# Patient Record
Sex: Male | Born: 1955
Health system: Southern US, Community
[De-identification: ages and names within clinical notes are randomized; demographics above are authoritative.]

## PROBLEM LIST (undated history)

## (undated) DIAGNOSIS — I739 Peripheral vascular disease, unspecified: Secondary | ICD-10-CM

## (undated) DIAGNOSIS — E785 Hyperlipidemia, unspecified: Secondary | ICD-10-CM

## (undated) DIAGNOSIS — R748 Abnormal levels of other serum enzymes: Secondary | ICD-10-CM

## (undated) DIAGNOSIS — I219 Acute myocardial infarction, unspecified: Secondary | ICD-10-CM

## (undated) DIAGNOSIS — I2510 Atherosclerotic heart disease of native coronary artery without angina pectoris: Secondary | ICD-10-CM

## (undated) DIAGNOSIS — E119 Type 2 diabetes mellitus without complications: Secondary | ICD-10-CM

## (undated) DIAGNOSIS — Z87442 Personal history of urinary calculi: Secondary | ICD-10-CM

## (undated) HISTORY — PX: CORONARY STENT PLACEMENT: SHX1402

## (undated) HISTORY — DX: Hyperlipidemia, unspecified: E78.5

## (undated) HISTORY — PX: EYE SURGERY: SHX253

## (undated) HISTORY — DX: Acute myocardial infarction, unspecified: I21.9

## (undated) HISTORY — DX: Atherosclerotic heart disease of native coronary artery without angina pectoris: I25.10

## (undated) HISTORY — DX: Peripheral vascular disease, unspecified: I73.9

## (undated) HISTORY — DX: Abnormal levels of other serum enzymes: R74.8

## (undated) HISTORY — DX: Type 2 diabetes mellitus without complications: E11.9

## (undated) HISTORY — DX: Personal history of urinary calculi: Z87.442

---

## 2004-01-21 ENCOUNTER — Other Ambulatory Visit: Payer: Self-pay

## 2004-01-23 ENCOUNTER — Other Ambulatory Visit: Payer: Self-pay

## 2005-09-06 ENCOUNTER — Ambulatory Visit: Payer: Self-pay | Admitting: Urology

## 2005-09-06 IMAGING — CT CT ABD-PELV W/O CM
1 of 2 series · 15 of 32 positions shown, 19 images · non-contrast
Comparison: none

REASON FOR EXAM: neophrolithiasis  renal colic  hematuria
COMMENTS:

[Series 2: stone · axial · 0.72mm/px · z∈[-54,+356]mm · 15 of 155 slices shown, 19 images]
[im 12/155  soft-tissue]
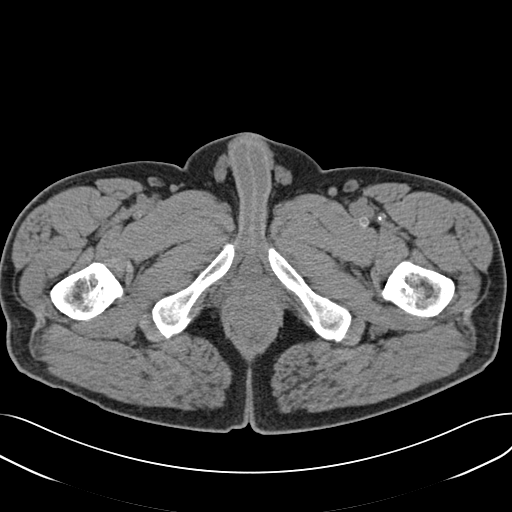
[im 12/155  bone]
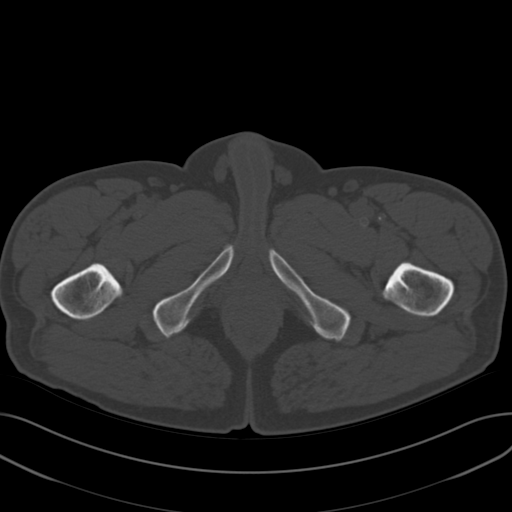
[im 23/155  soft-tissue]
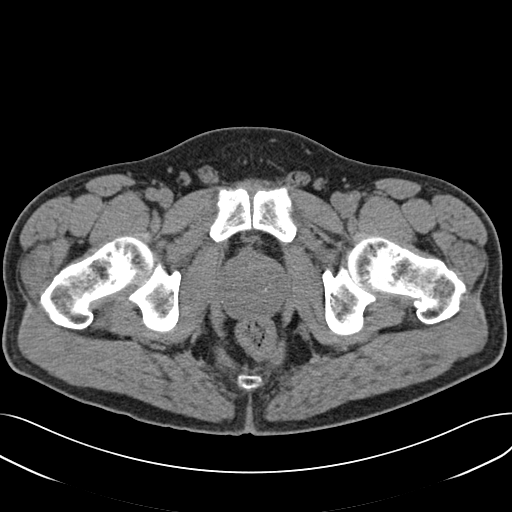
[im 34/155  soft-tissue]
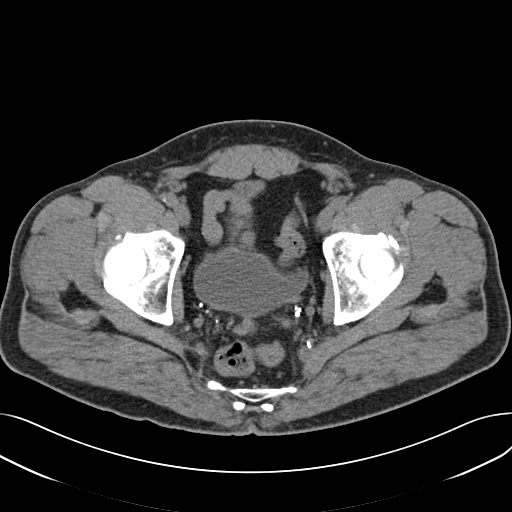
[im 45/155  soft-tissue]
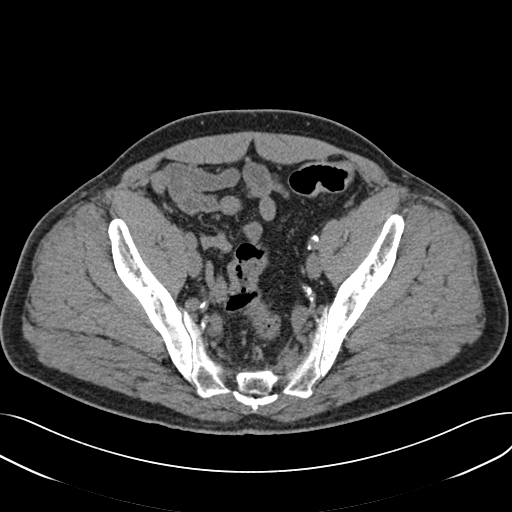
[im 56/155  soft-tissue]
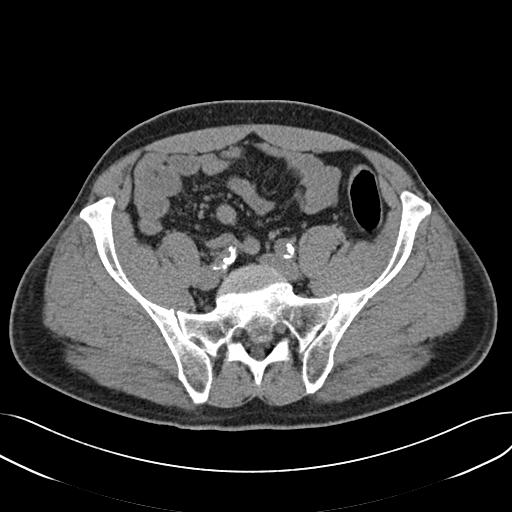
[im 67/155  soft-tissue]
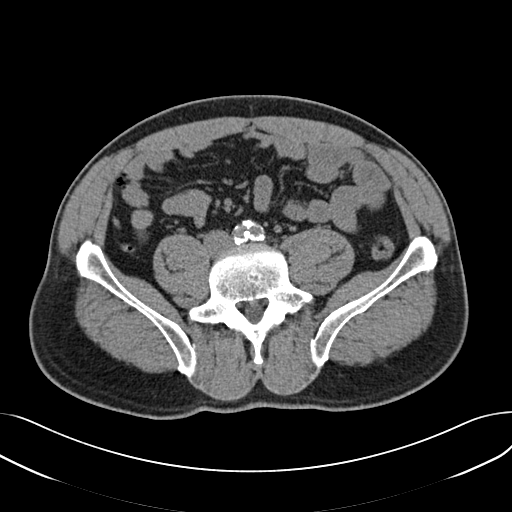
[im 78/155  soft-tissue]
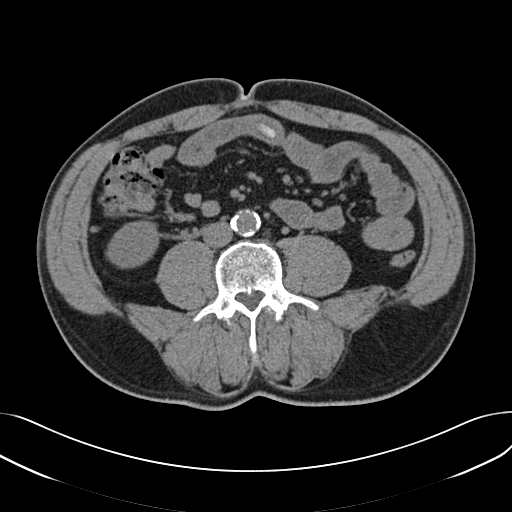
[im 89/155  soft-tissue]
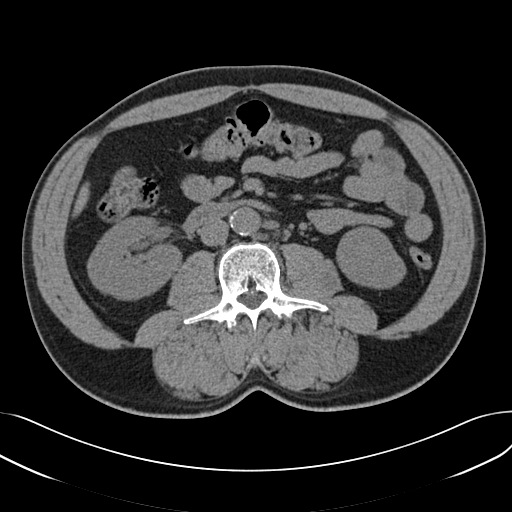
[im 100/155  soft-tissue]
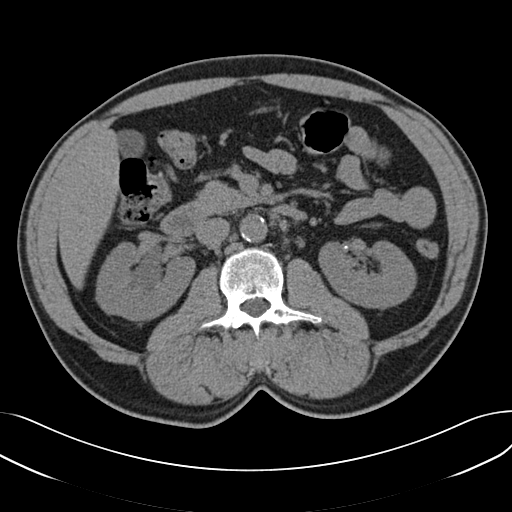
[im 100/155  bone]
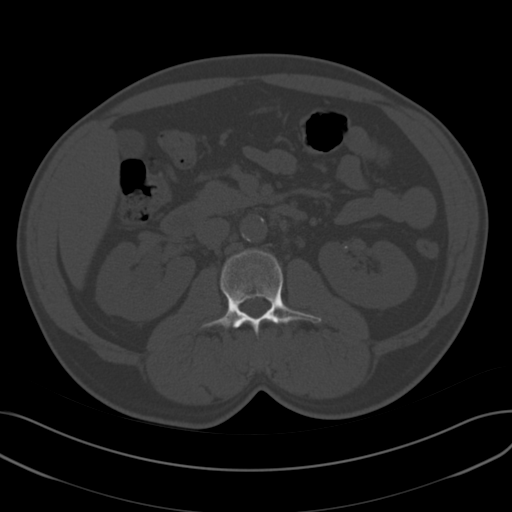
[im 111/155  soft-tissue]
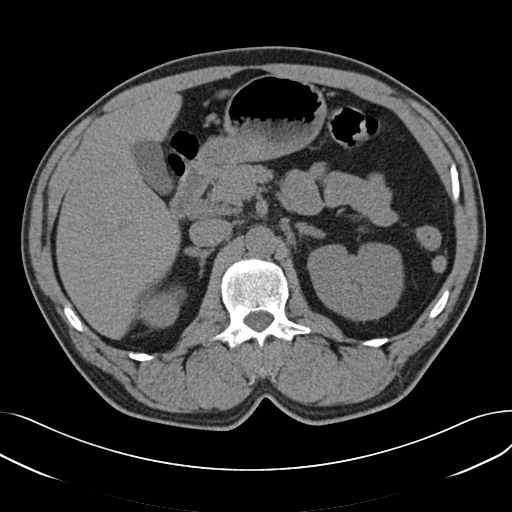
[im 122/155  soft-tissue]
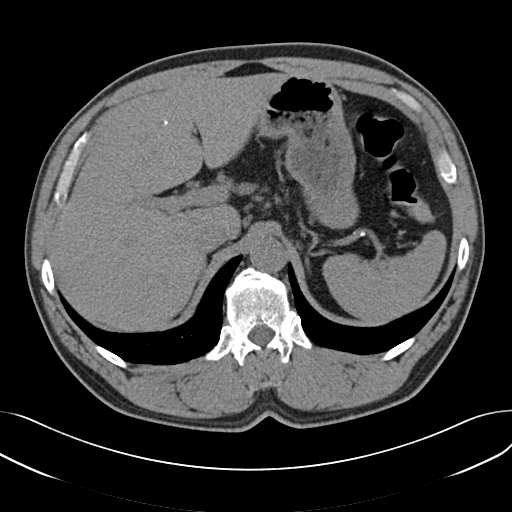
[im 133/155  soft-tissue]
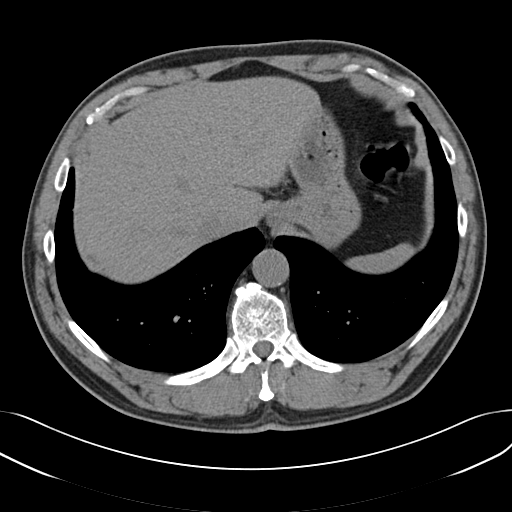
[im 133/155  lung]
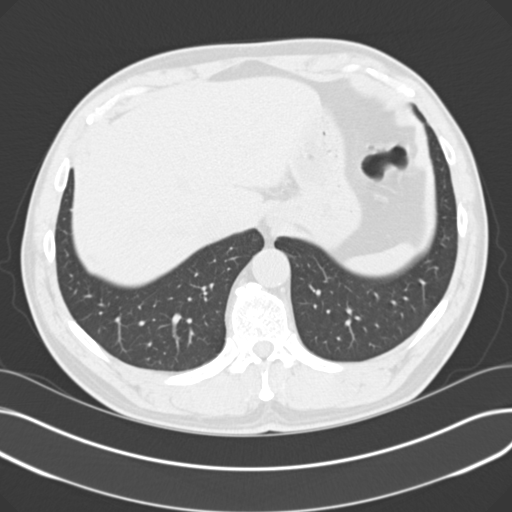
[im 138/155  lung]
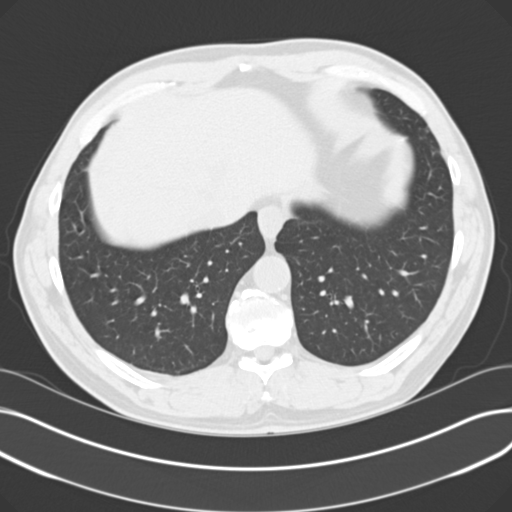
[im 144/155  soft-tissue]
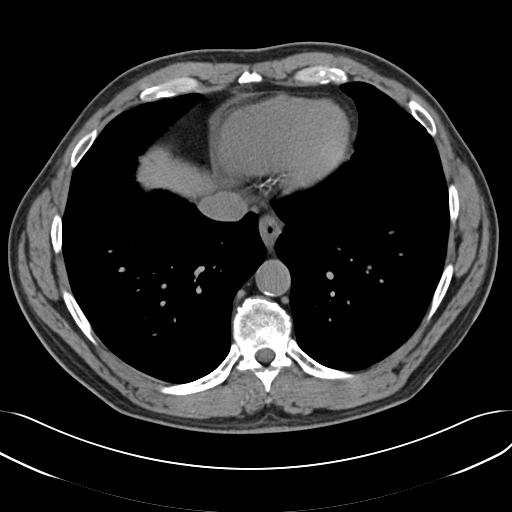
[im 144/155  lung]
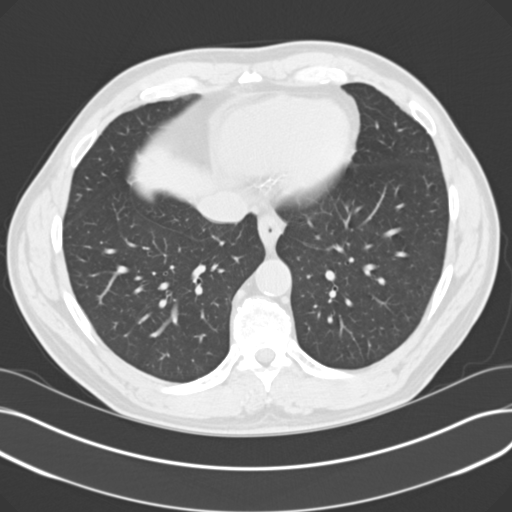
[im 149/155  lung]
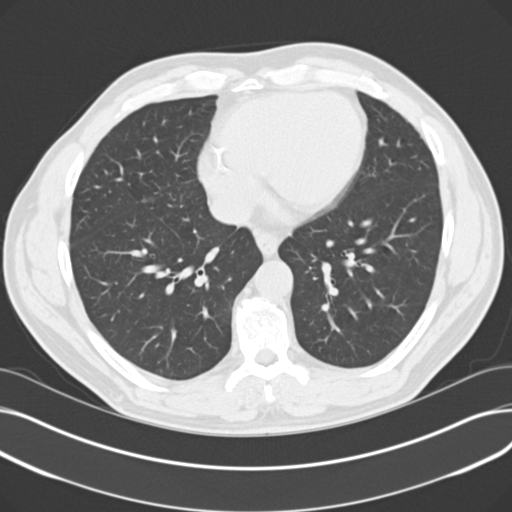

[15 of 32 positions shown; findings below may reference images not displayed]

PROCEDURE:     CT  - CT ABDOMEN AND PELVIS W[DATE]  [DATE]

RESULT:     Spiral non-contrast 3 mm sections were obtained from the lung
bases through the pubic symphysis.

Evaluation of the lung bases demonstrates no gross abnormalities.

The RIGHT kidney demonstrates a large calculus within the renal pelvis
measuring approximately 1.25 cm in widest diameter.  There is moderate
pelvocaliectasis.  Small, non-obstructing calculi are demonstrated within
the caliceal region.  These appear to measure 1 to 3 mm in diameter.  No
evidence of RIGHT renal masses are identified.  There is no evidence of
hydroureter.  Multiple phleboliths are demonstrated within the pelvis. A 3
mm calcified density projects in the expected course of the RIGHT ureter
possibly representing a non-obstructing ureteral calculus within the distal
ureter just proximal to the ureterovesical junction.

Evaluation of the LEFT kidney demonstrates no evidence of hydronephrosis nor
masses. There appears to be vascular calcifications involving the renal
arteries. There is no evidence of hydroureter nor ureterolithiasis.
Non-contrast evaluation of the liver demonstrates a calcified density within
the LEFT lobe of the liver. The spleen, adrenals and pancreas are
unremarkable.  There is no evidence of free fluid, drainable loculated fluid
collections nor masses. Small, sub cm lymph nodes are demonstrated within
the mesentery and retroperitoneal regions.  These may represent reactive
lymph nodes.
IMPRESSION: 1)Large calculus within the RIGHT renal pelvis with associated moderate
pelvocaliectasis.

2)Findings suspicious for a possible non-obstructing distal RIGHT ureteral
calculus.  This is difficult to ascertain secondary to the multiplicity of
the phleboliths projecting in the expected course of the distal RIGHT
ureter.

3)Coarse vascular calcifications.

## 2005-09-08 ENCOUNTER — Other Ambulatory Visit: Payer: Self-pay

## 2005-09-15 ENCOUNTER — Inpatient Hospital Stay: Payer: Self-pay | Admitting: Urology

## 2005-09-15 IMAGING — CR DG ABDOMEN 1V
1 series · 1 of 1 positions shown · non-contrast
Comparison: none

REASON FOR EXAM: Renal calculi-lithotripsy
COMMENTS:

[view not recorded]
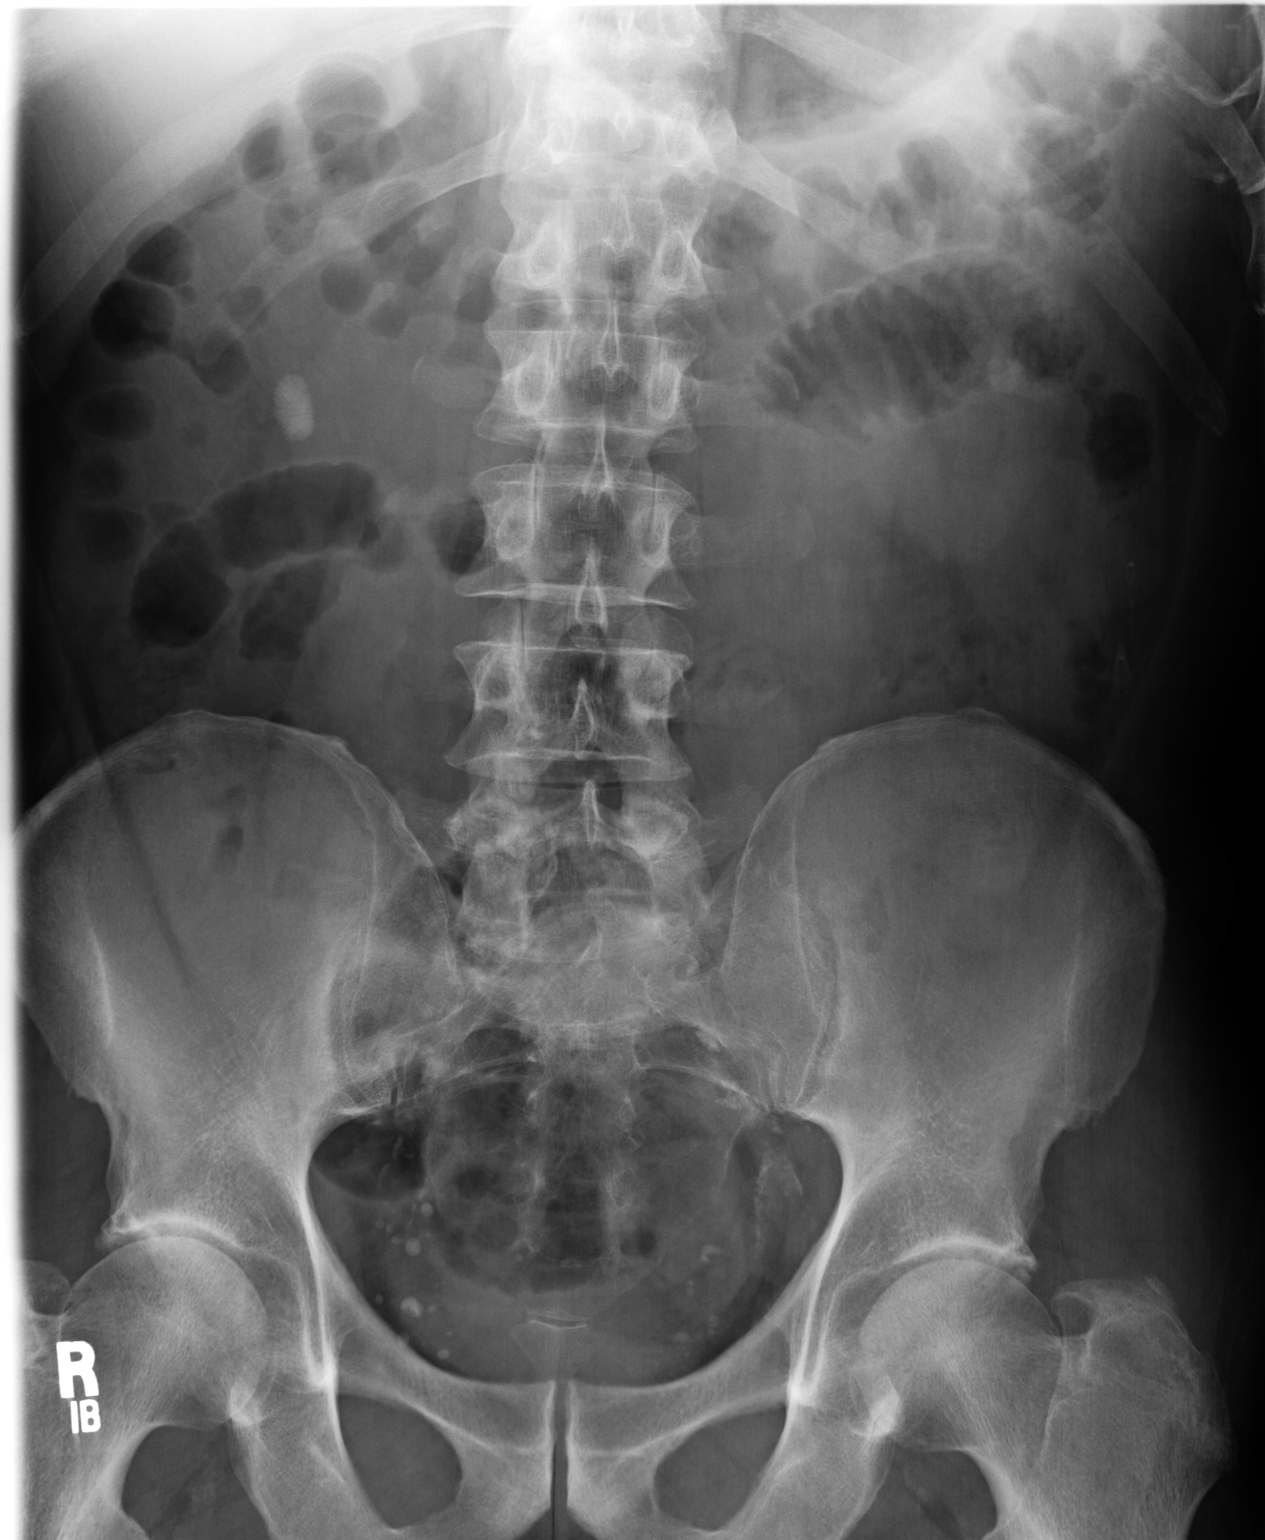

[1 of 1 positions shown; findings below may reference images not displayed]

PROCEDURE:     DXR - DXR KIDNEY URETER BLADDER  - [DATE]  [DATE]

RESULT:        There is a 9 mm x 17 mm calcific density projected at the
midpole region of the RIGHT kidney and which would appear to correspond to
the renal calculus observed at prior CT. Additional smaller calculi were
obtained at CT but these are noted on plain film examination and apparently
are faint calcifications or are obscured by overlying bowel and bowel
content.   No renal calcifications are seen on the LEFT.  There are multiple
calcifications in the pelvis compatible with phleboliths.    The bowel gas
pattern is normal.
IMPRESSION: Please see above.

## 2005-09-16 IMAGING — CR DG ABDOMEN 1V
1 series · 2 of 2 positions shown · non-contrast
Comparison: none

REASON FOR EXAM: Nephrolithiasis
COMMENTS:

[Series 1: view not recorded · 0.17mm/px · 2 of 2 slices shown]
[im 1/2]
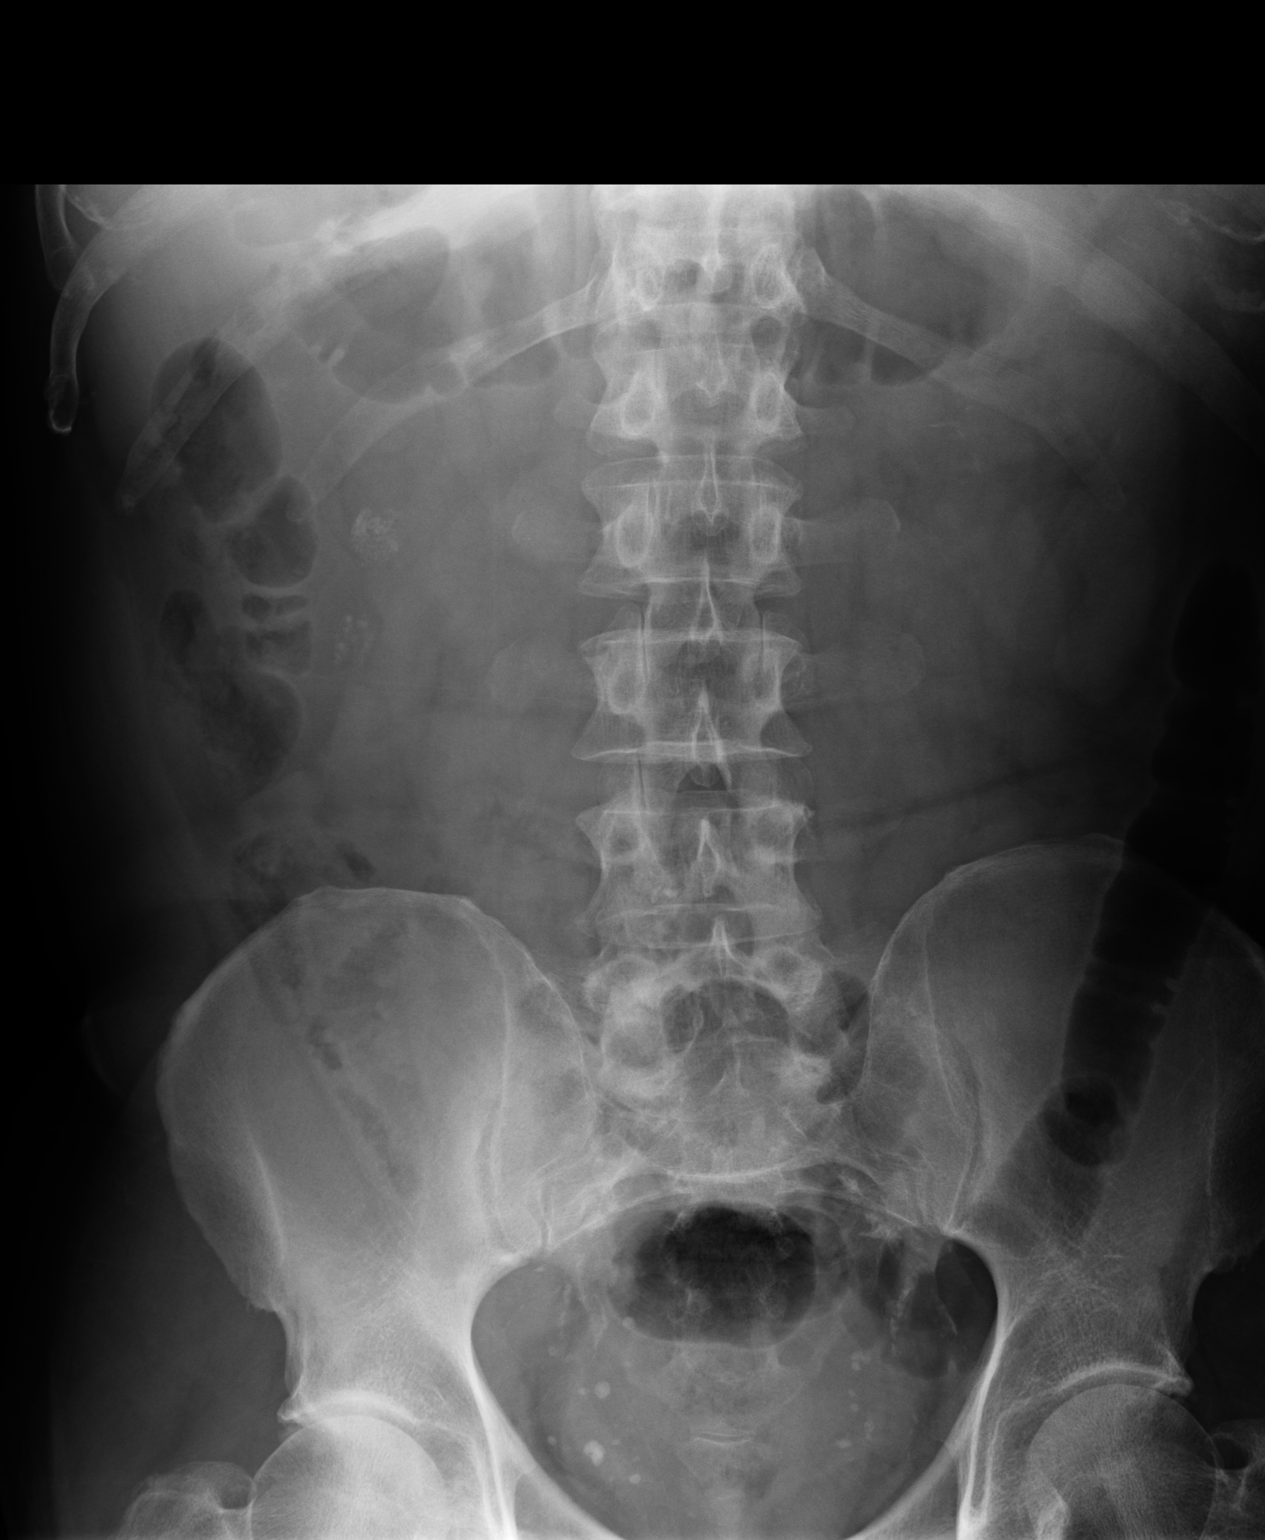
[im 2/2]
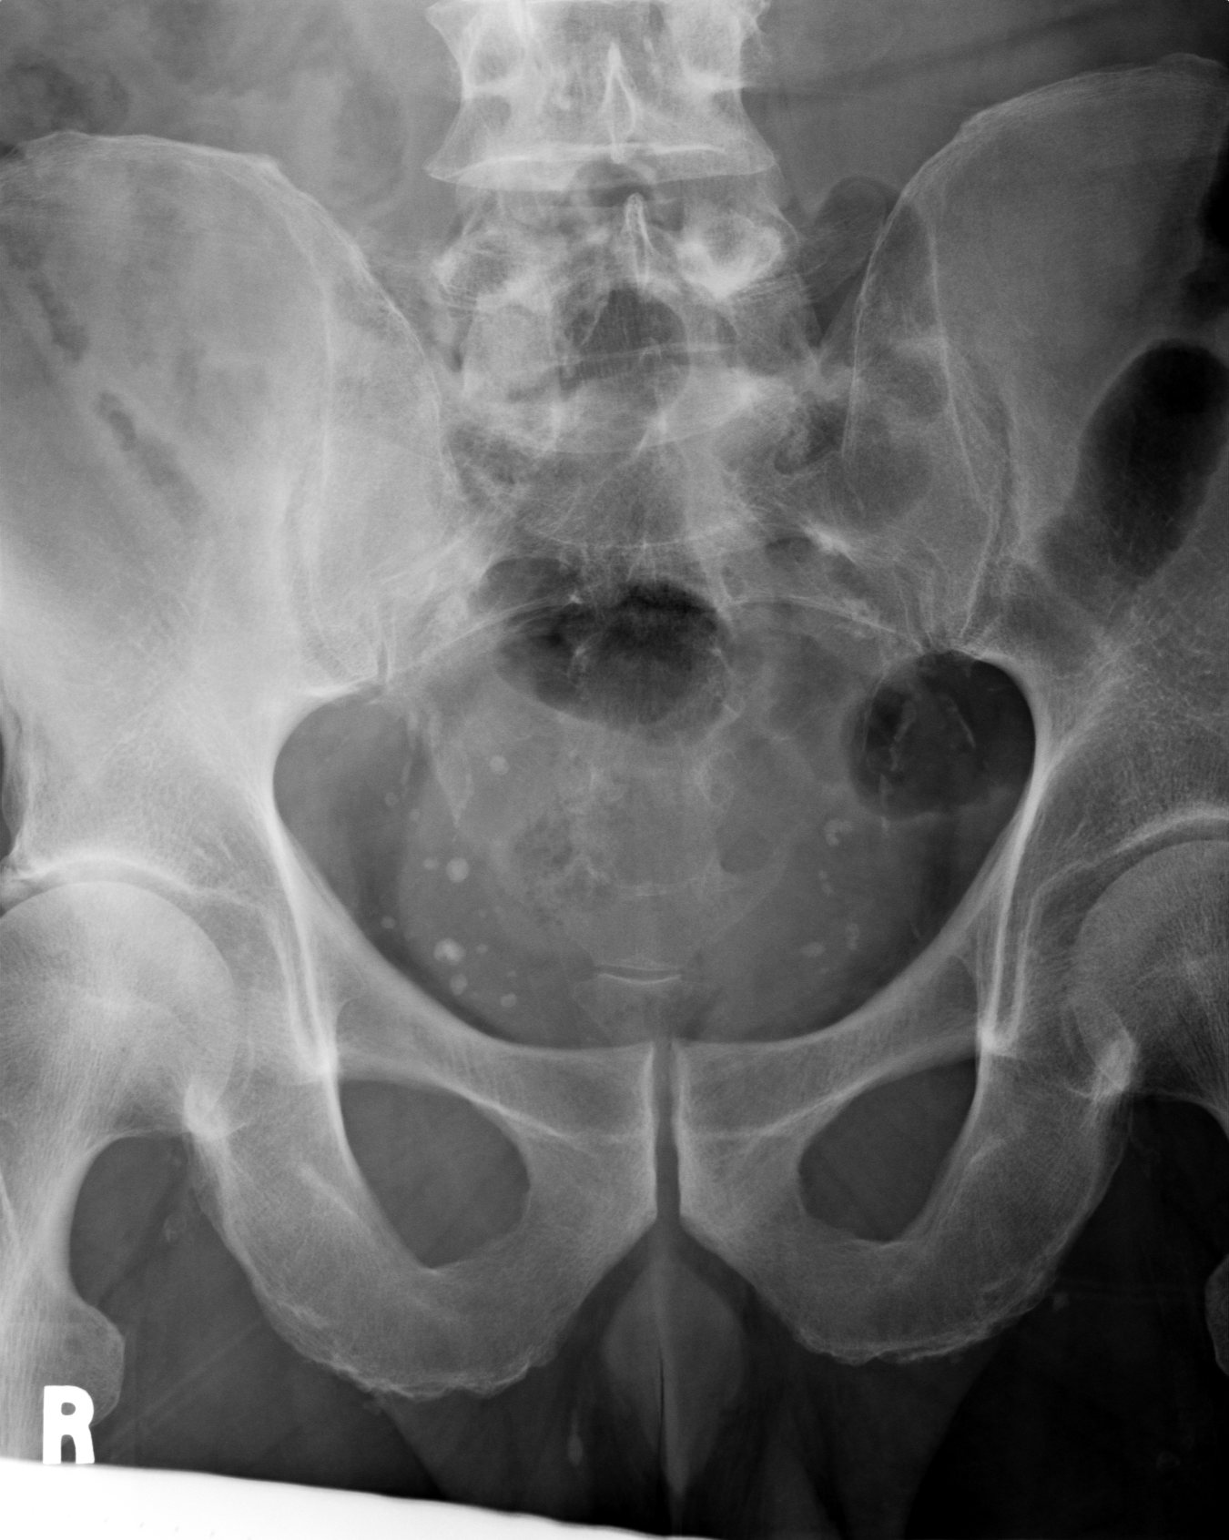

[2 of 2 positions shown; findings below may reference images not displayed]

PROCEDURE:     DXR - DXR KIDNEY URETER BLADDER  - [DATE]  [DATE]

RESULT:     AP view of the abdomen is compared to the prior exam of [DATE].
The previously noted calcification projected over the mid pole region of the
RIGHT kidney now appears fragmented and is less dense. The current study
shows calcific fragments projected over the lower pole of the RIGHT kidney.
No definite renal stones are identified on the LEFT.  There are multiple
phleboliths observed in the pelvis bilaterally.
IMPRESSION: 1)Please see above.

## 2005-09-27 ENCOUNTER — Ambulatory Visit: Payer: Self-pay | Admitting: Urology

## 2005-09-27 IMAGING — CR DG ABDOMEN 1V
1 series · 1 of 1 positions shown · non-contrast
Comparison: none

REASON FOR EXAM: Nephrolithasis   pt need films
COMMENTS:

[view not recorded]
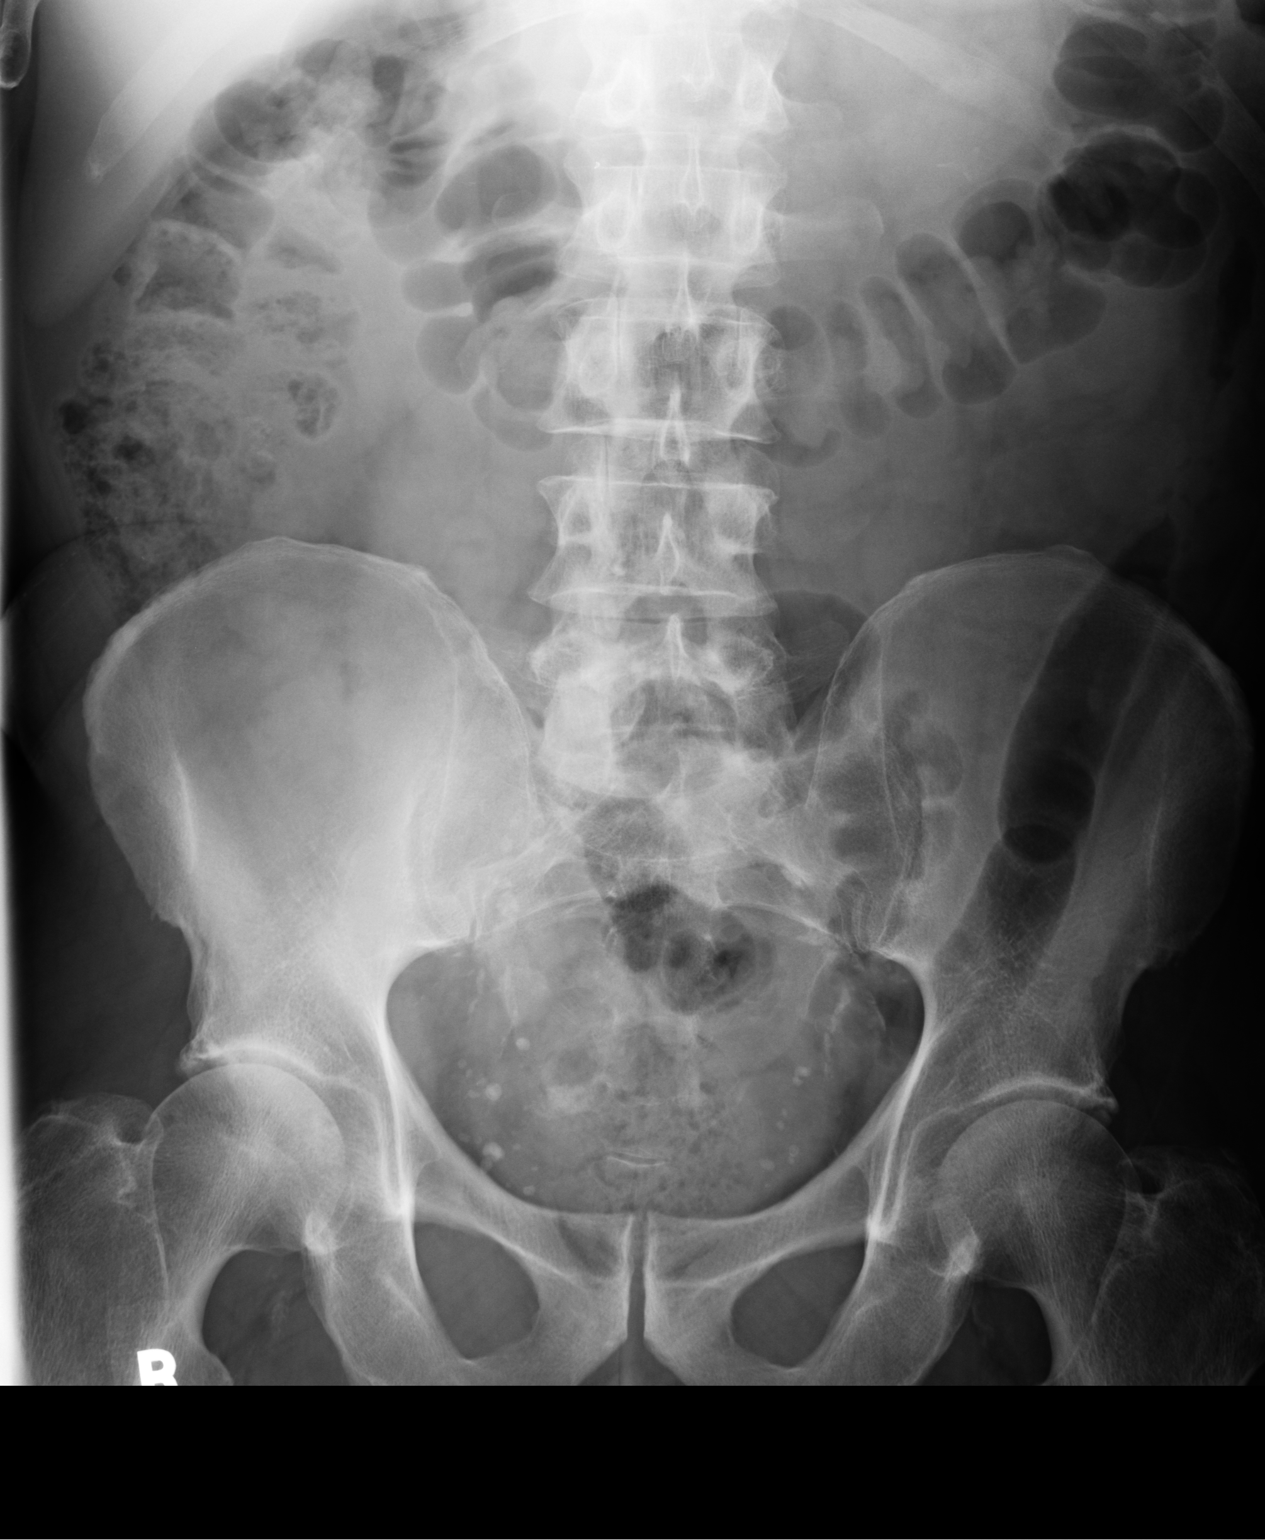

[1 of 1 positions shown; findings below may reference images not displayed]

PROCEDURE:     DXR - DXR KIDNEY URETER BLADDER  - [DATE]  [DATE]

RESULT:          Comparison is made to prior study dated [DATE].

Previously described calcified density projecting in the region of the RIGHT
renal fossa are poorly identified and partially obscured by stool.
Calcified densities projecting in the region of the mid to upper pole of the
LEFT renal fossa appear to be stable.  Multiple phleboliths are demonstrated
within the pelvis.  Small areas may represent distal ureteral calculi.  The
conspicuity of the densities in the region of the RIGHT renal fossa as
stated above has decreased though this may be simply secondary to be
obscured by stool.  A large amount of stool is appreciated in the region of
the sigmoid colon.  The visualized bony skeleton is unremarkable.
IMPRESSION: 1.     Findings suggesting decreased conspicuity/bulk of the calcified
densities in the RIGHT renal fossa.  As stated above, this may simply be
secondary to obscuration from stool.  Otherwise, nonobstructive bowel gas
pattern.
2.     Stable calcified density projecting in the region of the LEFT renal
fossa and stable phleboliths within the pelvis.

## 2006-01-04 ENCOUNTER — Ambulatory Visit: Payer: Self-pay | Admitting: Urology

## 2006-01-04 IMAGING — CR DG ABDOMEN 1V
1 series · 3 of 3 positions shown · non-contrast
Comparison: none

REASON FOR EXAM: NEPHROLITHIASIS
COMMENTS:

[Series 1: view not recorded · 0.17mm/px · 3 of 3 slices shown]
[im 1/3]
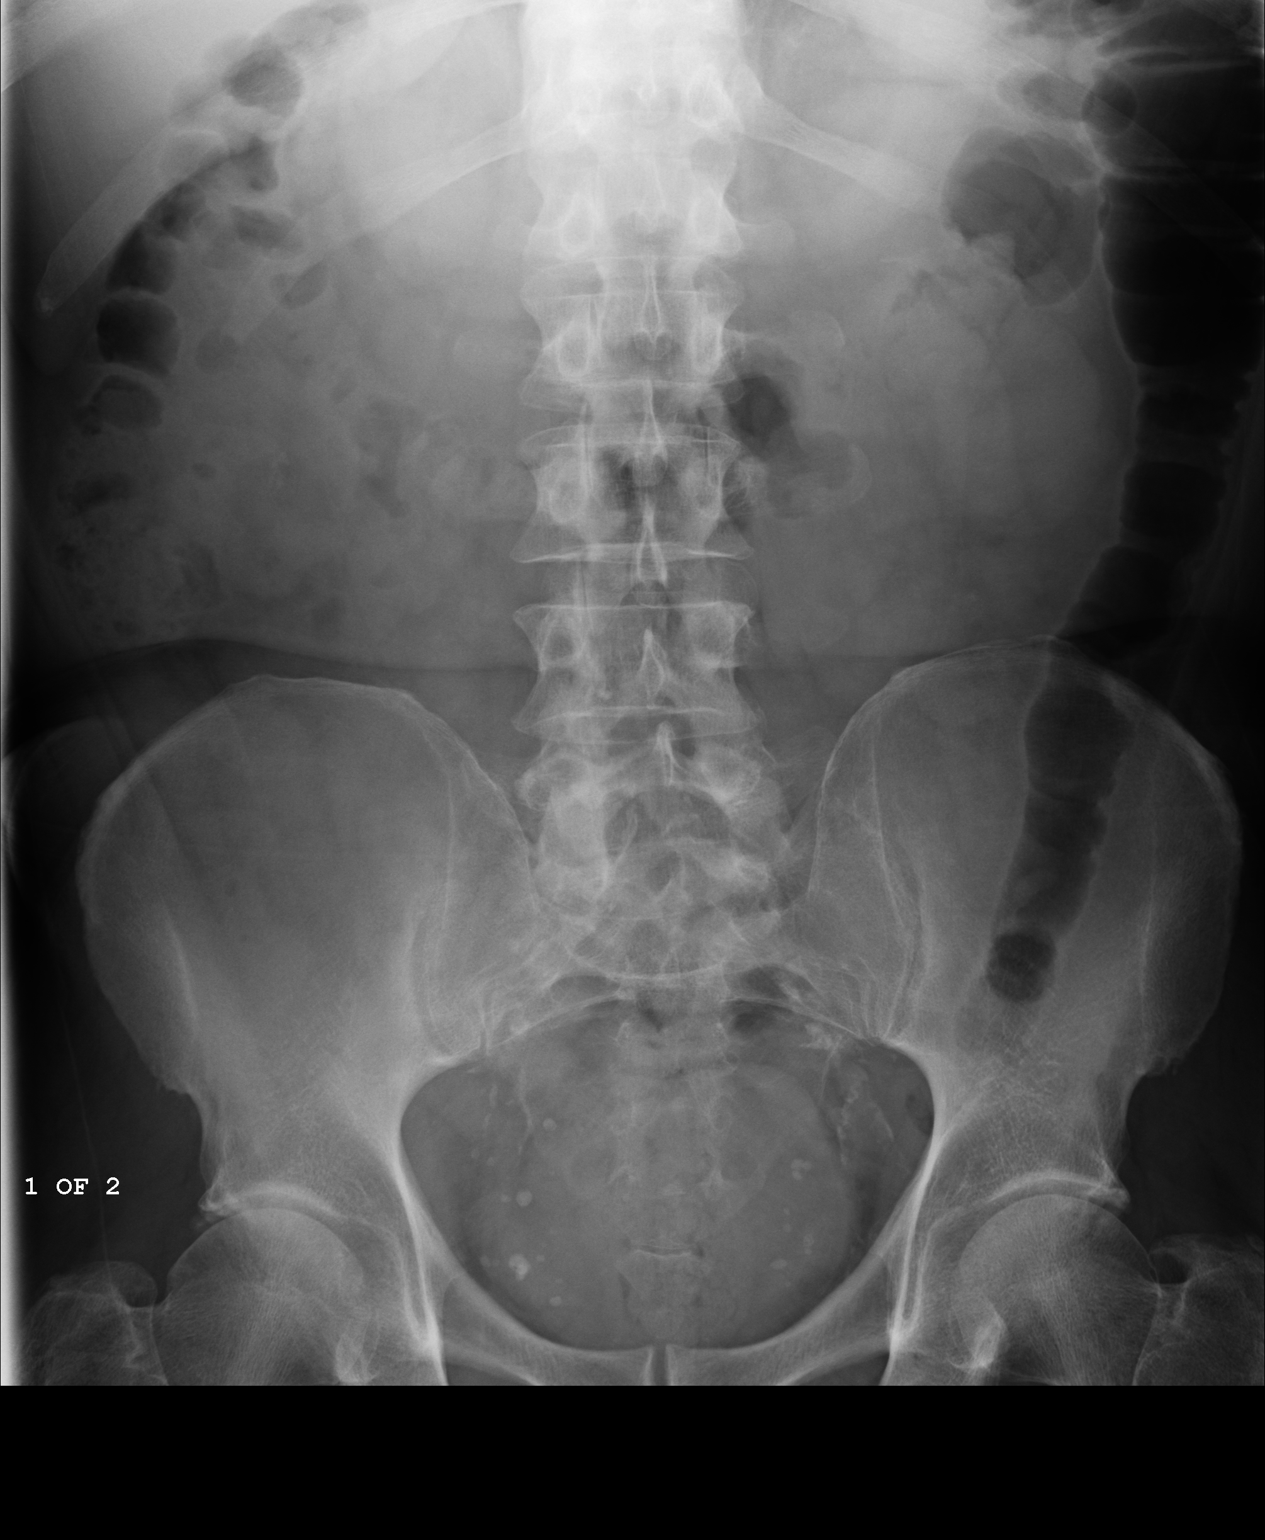
[im 2/3]
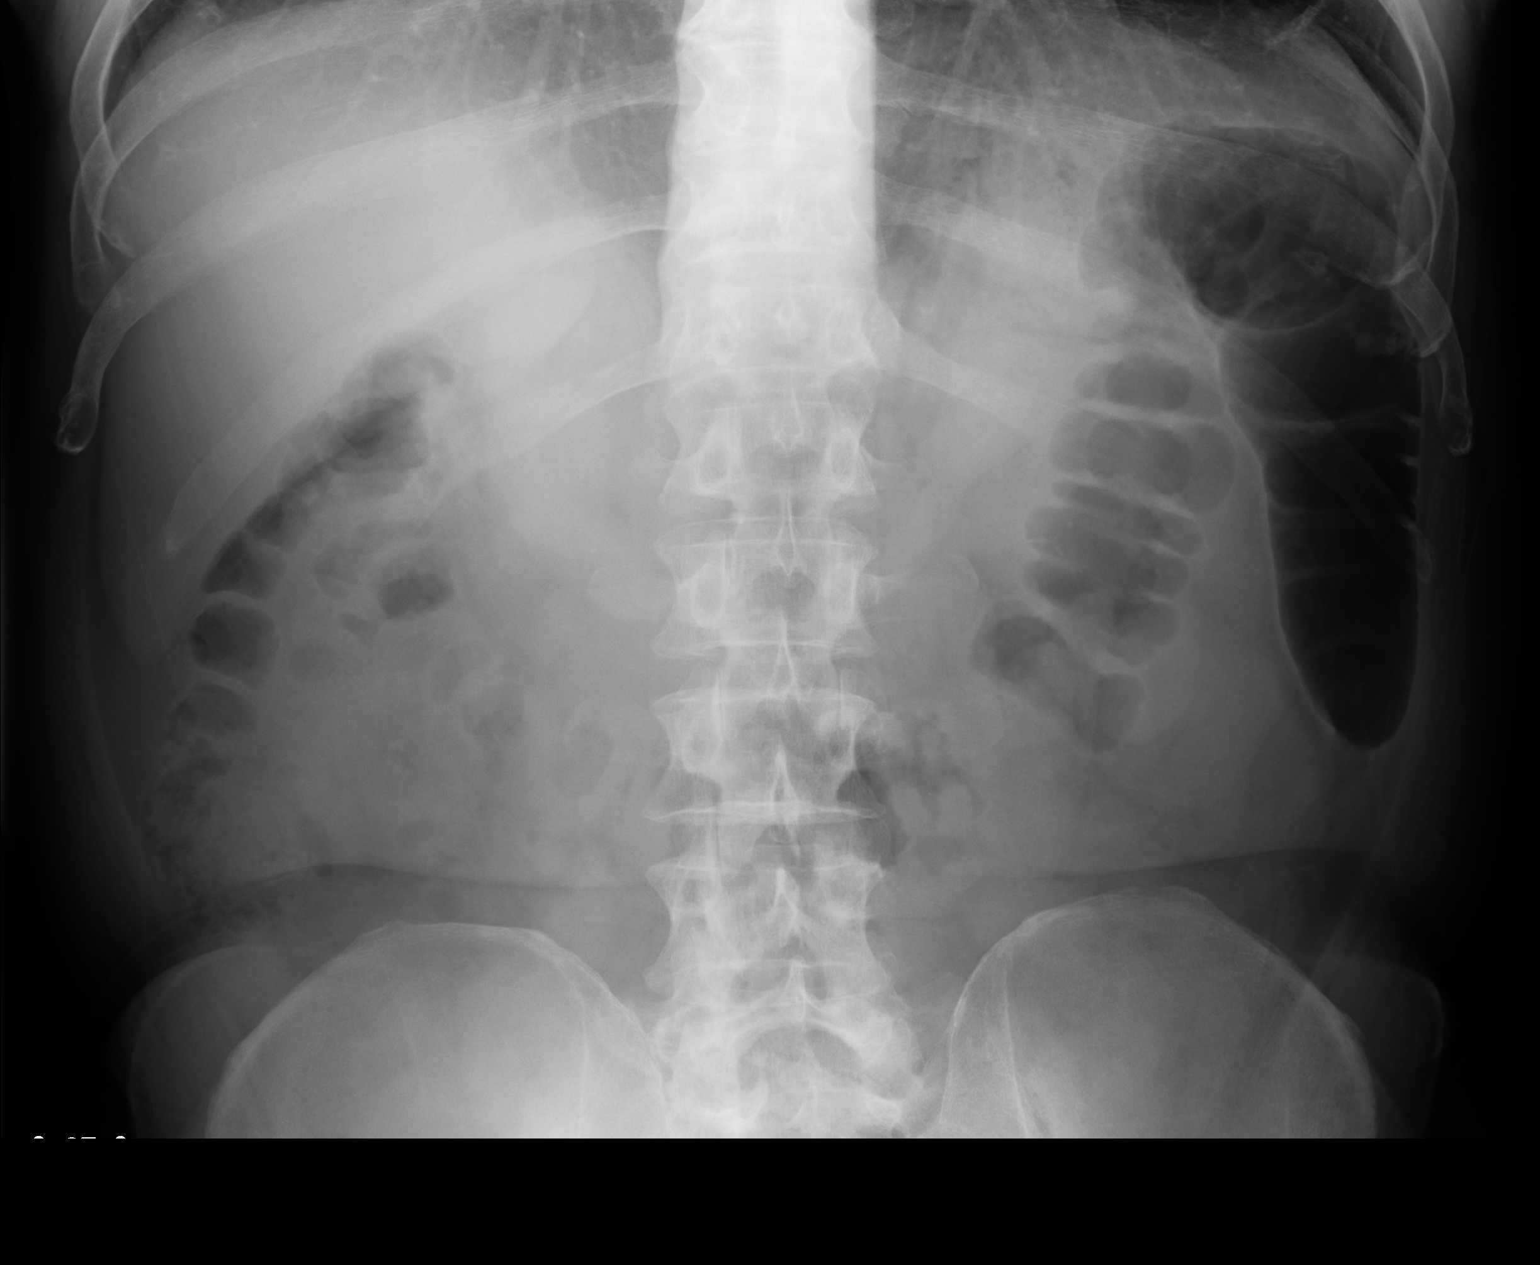
[im 3/3]
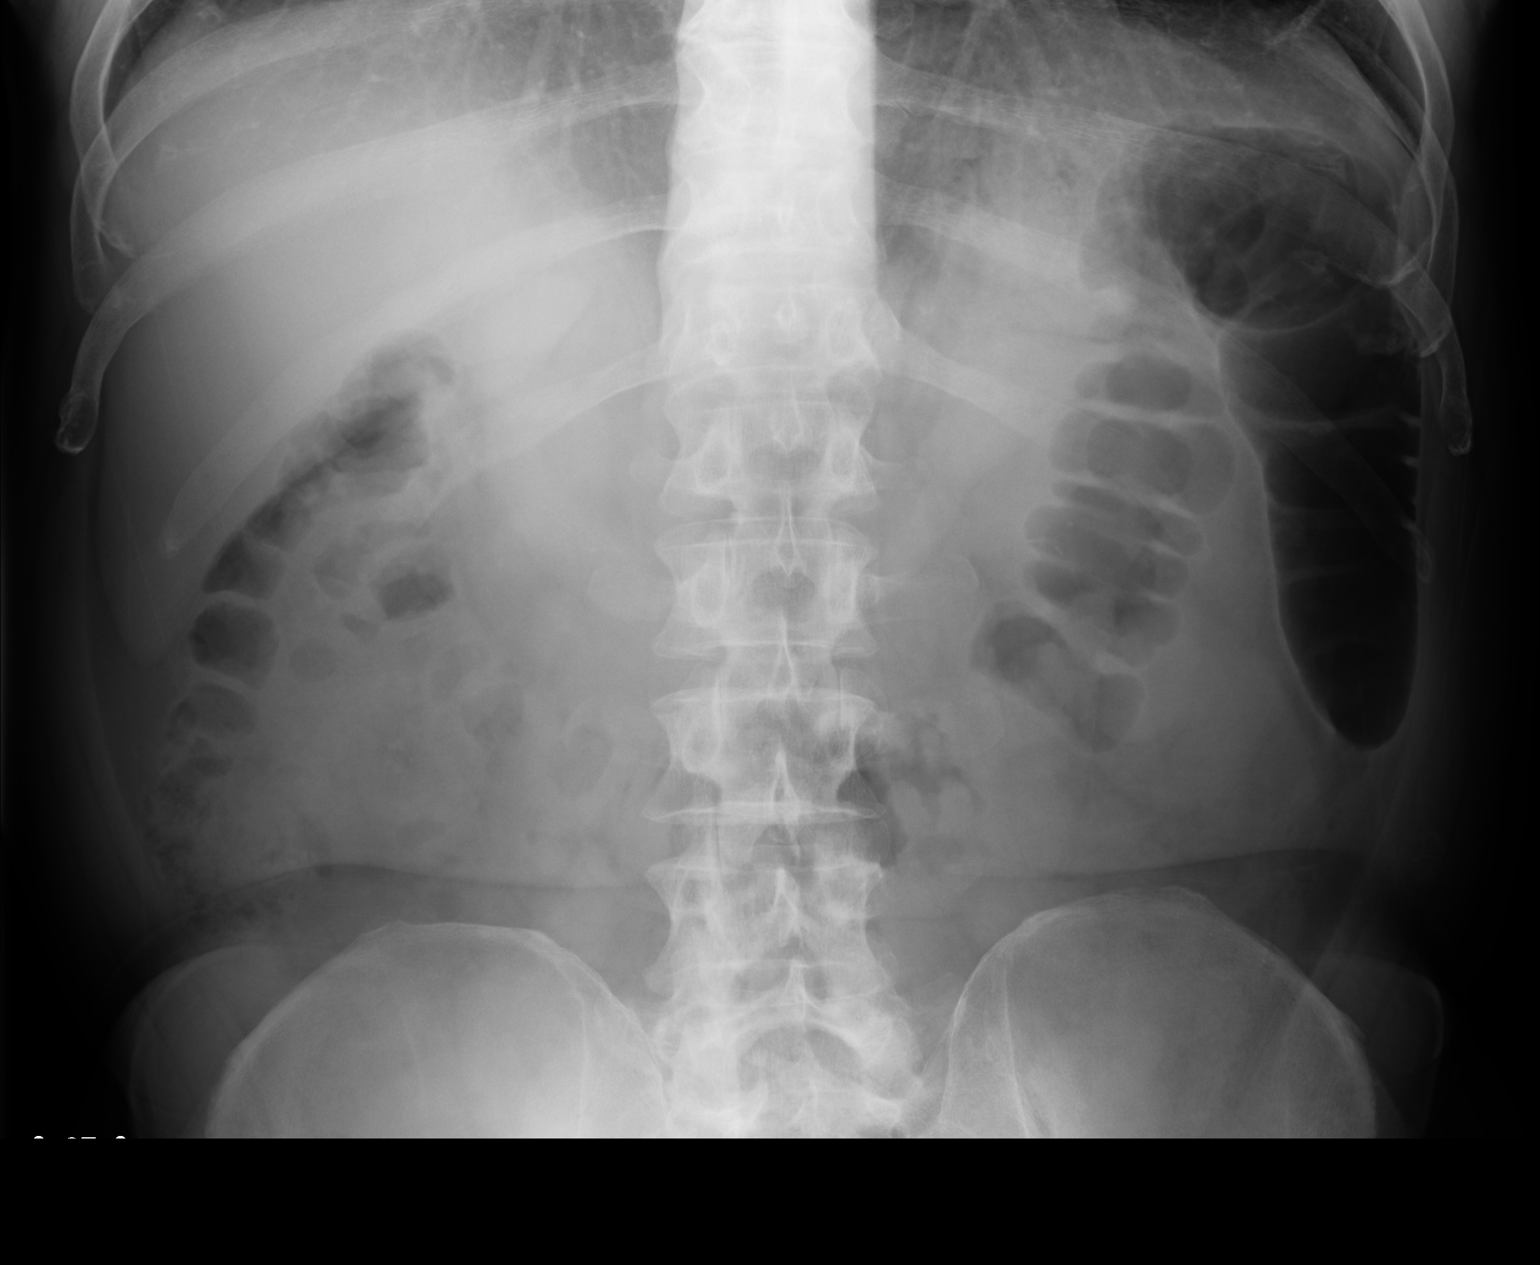

[3 of 3 positions shown; findings below may reference images not displayed]

PROCEDURE:     DXR - DXR KIDNEY URETER BLADDER  - [DATE]  [DATE]

RESULT:     AP view of the abdomen shows a few, small calcifications
projected over the lower pole of the RIGHT kidney. No definite ureteral
calcifications are seen. No LEFT renal calcifications are identified. The
bowel gas pattern is normal.
IMPRESSION: There are a few, small, lower pole caliceal calcifications
on the RIGHT.

## 2012-07-01 ENCOUNTER — Inpatient Hospital Stay: Payer: Self-pay | Admitting: Internal Medicine

## 2012-07-01 DIAGNOSIS — I219 Acute myocardial infarction, unspecified: Secondary | ICD-10-CM

## 2012-07-01 HISTORY — DX: Acute myocardial infarction, unspecified: I21.9

## 2012-07-01 LAB — URINALYSIS, COMPLETE
Bilirubin,UR: NEGATIVE
Blood: NEGATIVE
Glucose,UR: >=500 mg/dL (ref 0–75)
Leukocyte Esterase: NEGATIVE
Nitrite: NEGATIVE
Ph: 7 (ref 4.5–8.0)
Protein: 100
RBC,UR: 3 /HPF (ref 0–5)
Specific Gravity: 1.027 (ref 1.003–1.030)

## 2012-07-01 LAB — COMPREHENSIVE METABOLIC PANEL
Albumin: 4.5 g/dL (ref 3.4–5.0)
Alkaline Phosphatase: 59 U/L (ref 50–136)
Anion Gap: 15 (ref 7–16)
BUN: 22 mg/dL — ABNORMAL HIGH (ref 7–18)
Chloride: 101 mmol/L (ref 98–107)
Co2: 22 mmol/L (ref 21–32)
Creatinine: 1.23 mg/dL (ref 0.60–1.30)
EGFR (African American): >60
EGFR (Non-African Amer.): >60
Osmolality: 288 (ref 275–301)
Potassium: 4.4 mmol/L (ref 3.5–5.1)
SGOT(AST): 44 U/L — ABNORMAL HIGH (ref 15–37)
SGPT (ALT): 87 U/L — ABNORMAL HIGH (ref 12–78)
Total Protein: 8 g/dL (ref 6.4–8.2)

## 2012-07-01 LAB — CBC
HGB: 16 g/dL (ref 13.0–18.0)
MCH: 29.7 pg (ref 26.0–34.0)
MCHC: 33.6 g/dL (ref 32.0–36.0)
MCV: 88 fL (ref 80–100)
Platelet: 265 10*3/uL (ref 150–440)
RBC: 5.38 10*6/uL (ref 4.40–5.90)
RDW: 13.5 % (ref 11.5–14.5)

## 2012-07-01 LAB — CK TOTAL AND CKMB (NOT AT ARMC)
CK, Total: 147 U/L (ref 35–232)
CK, Total: 1306 U/L — ABNORMAL HIGH (ref 35–232)
CK-MB: 3.6 ng/mL (ref 0.5–3.6)
CK-MB: 85.6 ng/mL — ABNORMAL HIGH (ref 0.5–3.6)
CK-MB: 62.6 ng/mL — ABNORMAL HIGH (ref 0.5–3.6)

## 2012-07-01 LAB — APTT: Activated PTT: 27.6 secs (ref 23.6–35.9)

## 2012-07-01 LAB — TROPONIN I
Troponin-I: 0.35 ng/mL — ABNORMAL HIGH
Troponin-I: 35 ng/mL — ABNORMAL HIGH

## 2012-07-01 LAB — LIPASE, BLOOD: Lipase: 161 U/L (ref 73–393)

## 2012-07-01 IMAGING — CT CT ABD-PELV W/O CM
1 of 2 series · 15 of 32 positions shown, 19 images · non-contrast
Comparison: none

REASON FOR EXAM: (1) intractable vomiting; (2) vomiting
COMMENTS:

PROCEDURE:     CT  - CT ABDOMEN AND PELVIS W[DATE]  [DATE]
RESULT:
TECHNIQUE: Helical noncontrasted 3 mm sections were obtained from the lung
bases through the pubic symphysis.

[Series 2: 3mm soft tissue · axial · 0.76mm/px · z∈[-510,-72]mm · 15 of 162 slices shown, 19 images]
[im 8/162  soft-tissue]
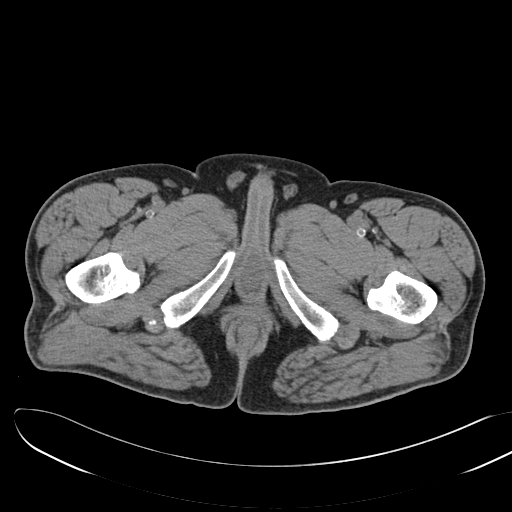
[im 8/162  bone]
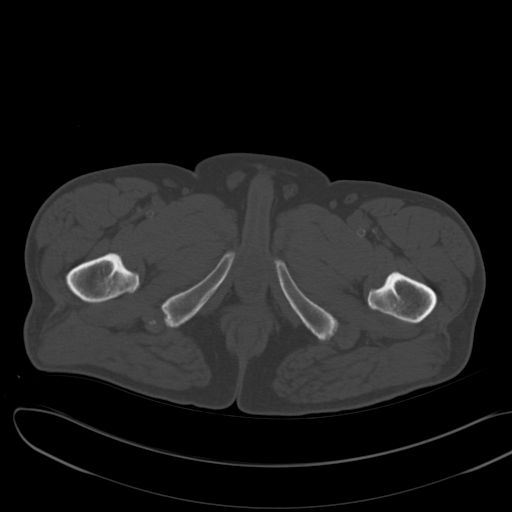
[im 22/162  soft-tissue]
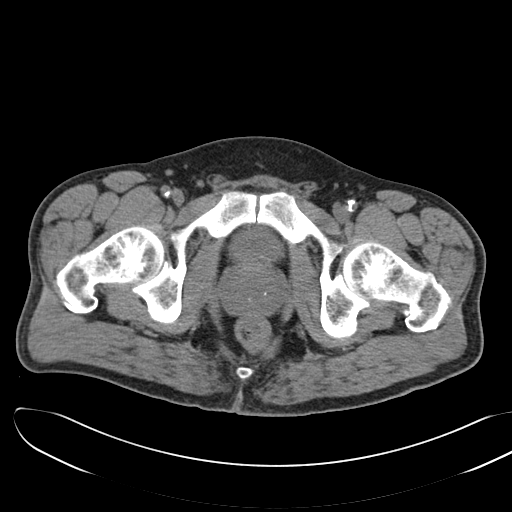
[im 37/162  soft-tissue]
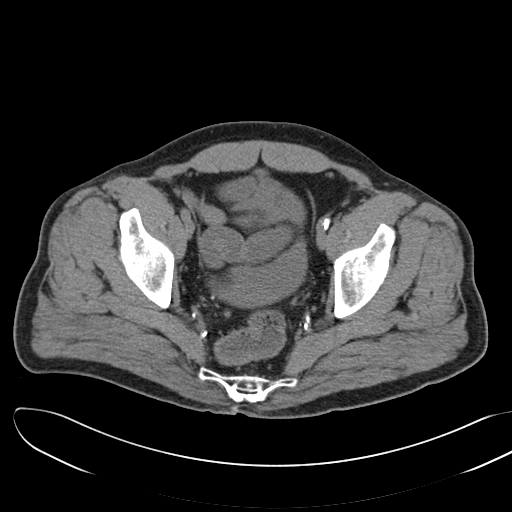
[im 44/162  soft-tissue]
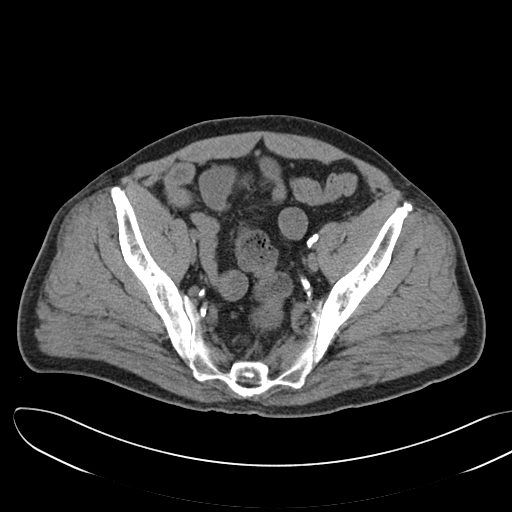
[im 59/162  soft-tissue]
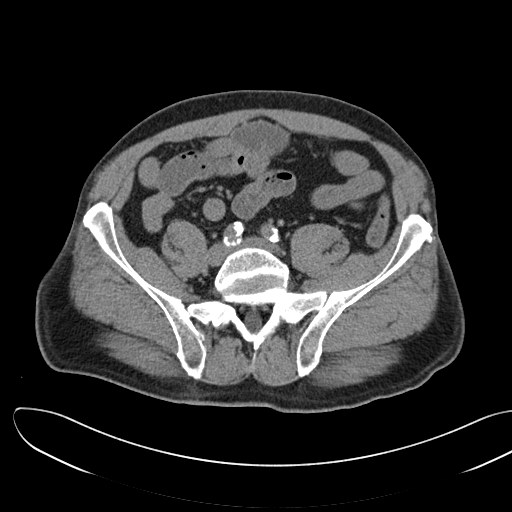
[im 66/162  soft-tissue]
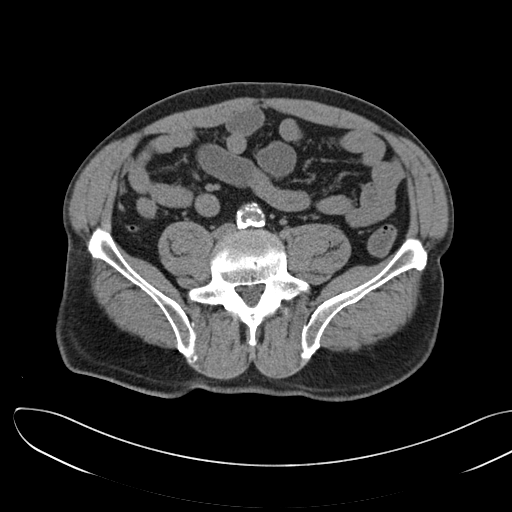
[im 81/162  soft-tissue]
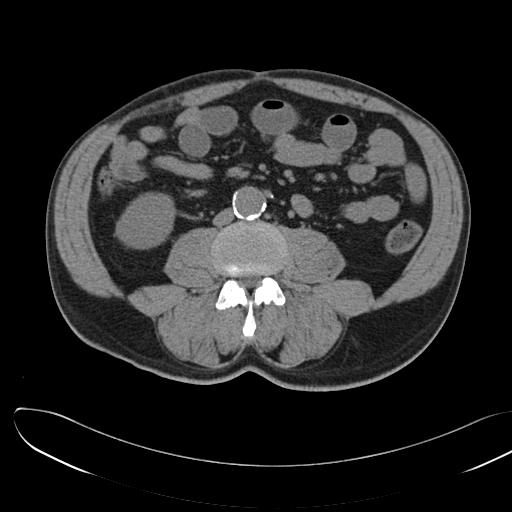
[im 96/162  soft-tissue]
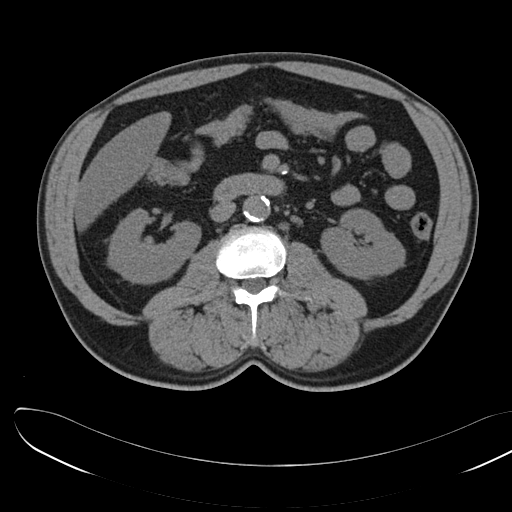
[im 103/162  soft-tissue]
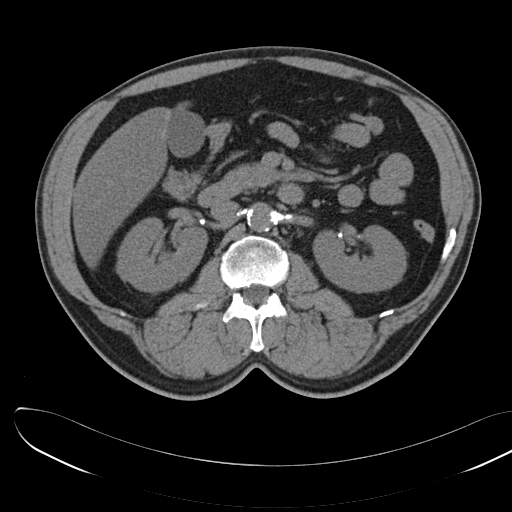
[im 103/162  bone]
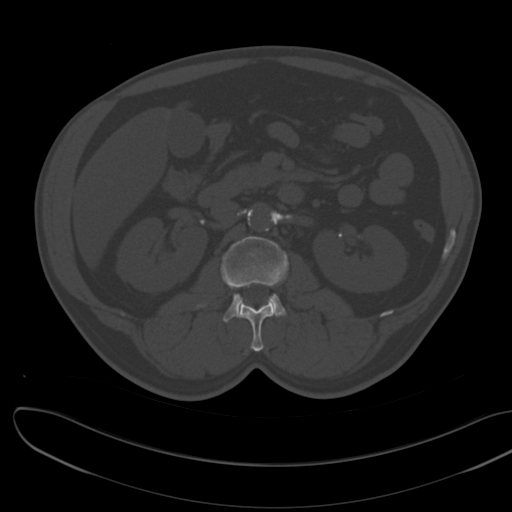
[im 118/162  soft-tissue]
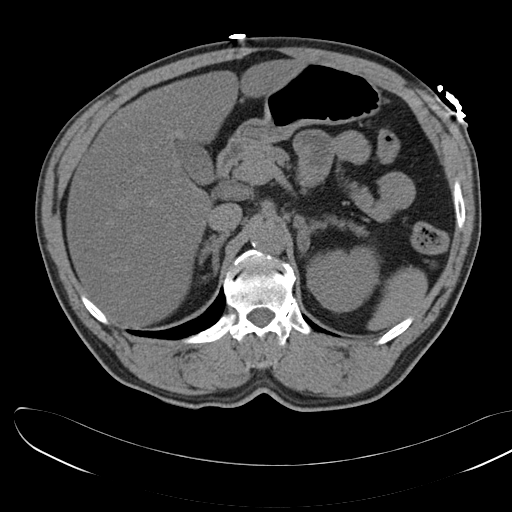
[im 125/162  soft-tissue]
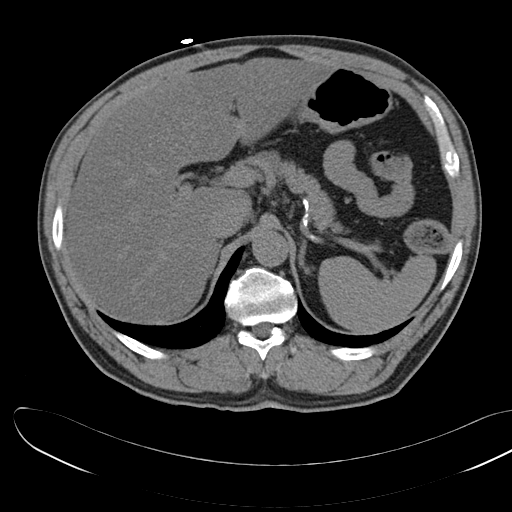
[im 132/162  lung]
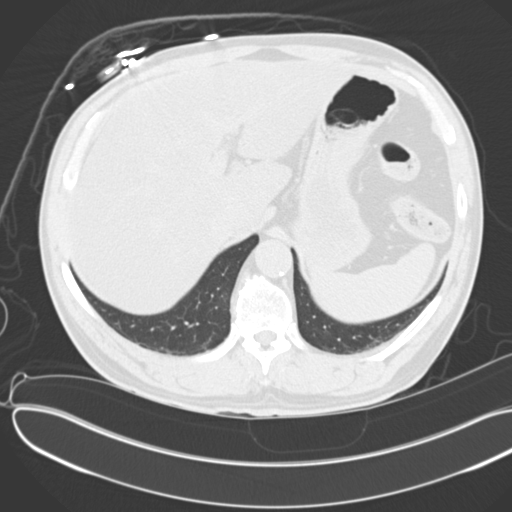
[im 140/162  soft-tissue]
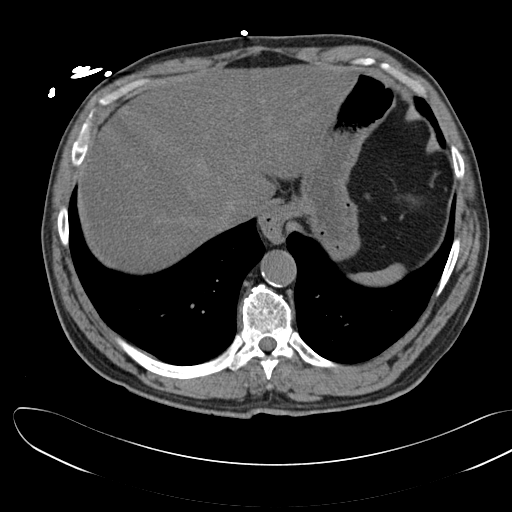
[im 140/162  lung]
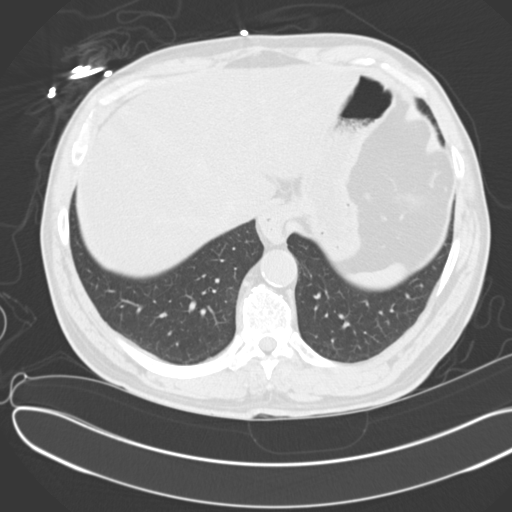
[im 147/162  lung]
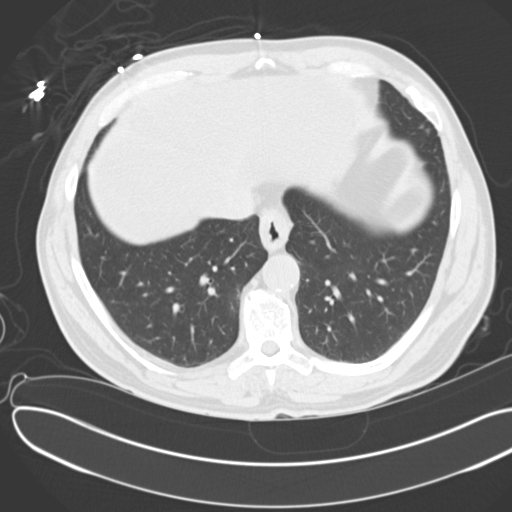
[im 154/162  soft-tissue]
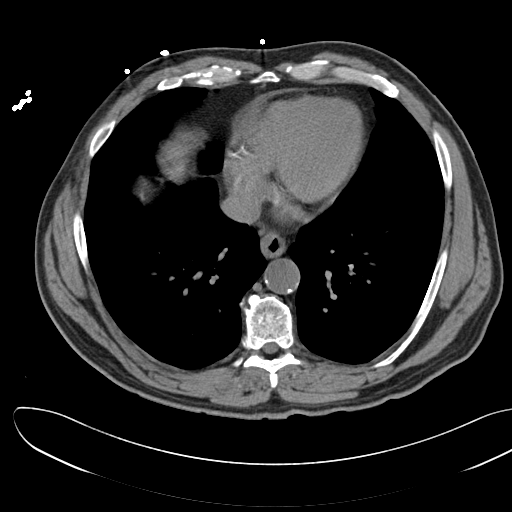
[im 154/162  lung]
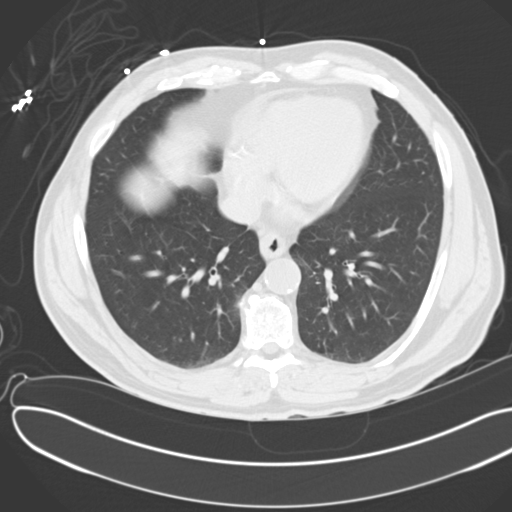

[15 of 32 positions shown; findings below may reference images not displayed]

FINDINGS: The lung bases are unremarkable.

Diffuse low-attenuation is identified within the liver parenchyma. The
spleen, adrenals and pancreas are unremarkable. Nonobstructing calculi are
identified within the right kidney and likely vascular calcifications within
the left. Multiple fluid-filled loops of small bowel are appreciated
throughout the abdomen and pelvis. There is a nonspecific finding. There is
otherwise no evidence of bowel obstruction. There is no evidence of an
abdominal aortic aneurysm. Within the limitations of a noncontrasted CT,
there is no evidence of abdominal or pelvic masses, free fluid, loculated
fluid collections or pathologic sized adenopathy. Small subcentimeter lymph
nodes are identified within the intraperitoneal and retroperitoneal regions.
The appendix is appreciated and is unremarkable. Incidental note is made of
a small calcified gallstone.
IMPRESSION: 1. Small calcified gallstone.
2. Fluid-filled loops of small bowel which is a nonspecific finding. An
underlying component of enteritis cannot be excluded. There is no evidence
of obstruction.
3. Nonobstructing renal calculi on the right.
4. Dr. DEEQA RAYAAN of the Emergency Department was informed of these findings via
a preliminary faxed report.

## 2012-07-01 IMAGING — CR DG CHEST 1V PORT
1 series · 1 of 1 positions shown · non-contrast
Comparison: none

REASON FOR EXAM: chest pain
COMMENTS:

PROCEDURE:     DXR - DXR PORTABLE CHEST SINGLE VIEW  - [DATE]  [DATE]
RESULT:     The lungs are clear. The cardiac silhouette and visualized bony
skeleton are unremarkable.

[ap]
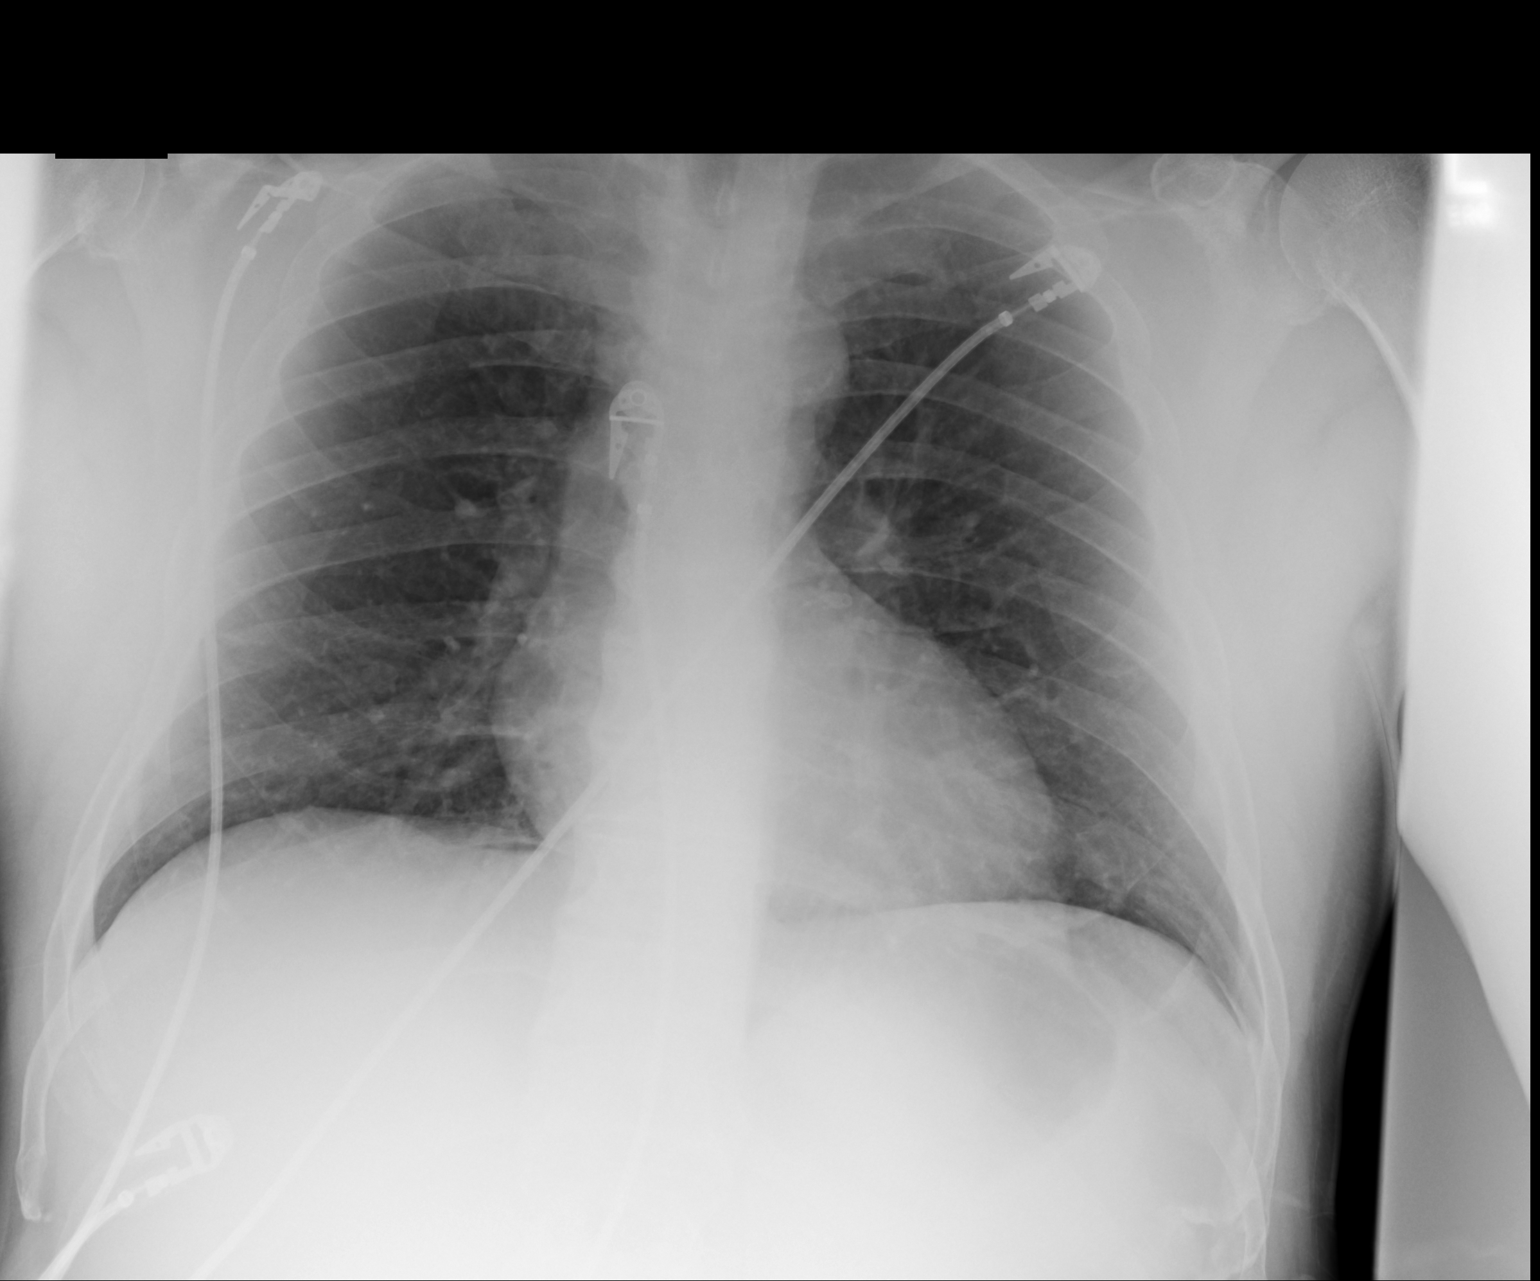

[1 of 1 positions shown; findings below may reference images not displayed]

IMPRESSION: 1. Chest radiograph without evidence of acute cardiopulmonary disease.

## 2012-07-02 LAB — BASIC METABOLIC PANEL
BUN: 18 mg/dL (ref 7–18)
Calcium, Total: 8.1 mg/dL — ABNORMAL LOW (ref 8.5–10.1)
Co2: 27 mmol/L (ref 21–32)
Creatinine: 0.72 mg/dL (ref 0.60–1.30)
EGFR (African American): >60
Osmolality: 285 (ref 275–301)
Potassium: 3.4 mmol/L — ABNORMAL LOW (ref 3.5–5.1)

## 2012-07-02 LAB — CBC
HCT: 38.8 % — ABNORMAL LOW (ref 40.0–52.0)
MCH: 29.6 pg (ref 26.0–34.0)
RDW: 13.7 % (ref 11.5–14.5)
WBC: 13.3 10*3/uL — ABNORMAL HIGH (ref 3.8–10.6)

## 2012-07-02 LAB — PROTIME-INR
INR: 1
Prothrombin Time: 13.6 secs (ref 11.5–14.7)

## 2012-07-02 LAB — LIPID PANEL
Cholesterol: 72 mg/dL (ref 0–200)
HDL Cholesterol: 28 mg/dL — ABNORMAL LOW (ref 40–60)
Triglycerides: 198 mg/dL (ref 0–200)

## 2012-07-03 LAB — BASIC METABOLIC PANEL
Anion Gap: 9 (ref 7–16)
Chloride: 108 mmol/L — ABNORMAL HIGH (ref 98–107)
Creatinine: 0.86 mg/dL (ref 0.60–1.30)
EGFR (Non-African Amer.): >60
Glucose: 185 mg/dL — ABNORMAL HIGH (ref 65–99)
Osmolality: 287 (ref 275–301)

## 2012-07-06 LAB — CULTURE, BLOOD (SINGLE)

## 2013-01-29 DIAGNOSIS — E1169 Type 2 diabetes mellitus with other specified complication: Secondary | ICD-10-CM | POA: Insufficient documentation

## 2013-01-29 DIAGNOSIS — IMO0002 Reserved for concepts with insufficient information to code with codable children: Secondary | ICD-10-CM | POA: Insufficient documentation

## 2013-01-29 DIAGNOSIS — H269 Unspecified cataract: Secondary | ICD-10-CM | POA: Insufficient documentation

## 2013-03-24 DIAGNOSIS — K219 Gastro-esophageal reflux disease without esophagitis: Secondary | ICD-10-CM | POA: Insufficient documentation

## 2013-03-24 DIAGNOSIS — Z955 Presence of coronary angioplasty implant and graft: Secondary | ICD-10-CM | POA: Insufficient documentation

## 2013-03-24 DIAGNOSIS — I2510 Atherosclerotic heart disease of native coronary artery without angina pectoris: Secondary | ICD-10-CM | POA: Insufficient documentation

## 2013-03-24 DIAGNOSIS — I10 Essential (primary) hypertension: Secondary | ICD-10-CM | POA: Insufficient documentation

## 2014-04-08 DIAGNOSIS — E785 Hyperlipidemia, unspecified: Secondary | ICD-10-CM | POA: Insufficient documentation

## 2014-04-08 DIAGNOSIS — E782 Mixed hyperlipidemia: Secondary | ICD-10-CM | POA: Insufficient documentation

## 2014-04-08 DIAGNOSIS — E1169 Type 2 diabetes mellitus with other specified complication: Secondary | ICD-10-CM | POA: Insufficient documentation

## 2014-09-05 NOTE — Consult Note (Signed)
PATIENT NAME:  Ivan Larsen, Noland E MR#:  [id number] DATE OF BIRTH:  10-31-1955  DATE OF CONSULTATION:  07/01/2012  REFERRING PHYSICIAN:      Huey Bienenstockawood Elgergawy, MD CONSULTING PHYSICIAN:   Arnoldo HookerBruce Kowalski, MD  PRIMARY CARE PHYSICIAN:  Jennet MaduroMarc Crissman, MD  REASON FOR CONSULTATION: Chest discomfort with known coronary artery disease and elevated troponin.   CHIEF COMPLAINT: "I had chest pain."   HISTORY OF PRESENT ILLNESS:  This is a middle-aged male with known coronary artery disease, status post PCI and stent placement of left anterior descending artery and right coronary  artery in 2005. The patient has diabetes well controlled, hypertension on appropriate medications and hyperlipidemia on statin. The patient has had new onset of gastroenteritis with abdominal discomfort and vomiting and dehydration. He was given appropriate medication management and fluids. The patient also has had chest discomfort which was occurring during his vomiting with pressure in his chest radiating into his back associated with shortness of breath. The patient has had resolution of this after medication management. EKG has shown normal sinus rhythm, otherwise normal EKG.  The patient also has had an elevation of troponin which is 0.3, consistent with demand ischemia at this time and no current evidence of myocardial infarction. The patient has had full resolution of chest pain.   REVIEW OF SYSTEMS:  The remainder review of systems is negative for vision change, ringing in the ears, hearing loss, cough, congestion, heartburn, nausea, vomiting, diarrhea, bloody stools, stomach pain, extremity pain, leg weakness, cramping of the buttocks, known blood clots, headaches, blackouts, dizzy spells, nosebleeds, congestion, trouble swallowing, frequent urination, urination at night, muscle weakness, numbness, anxiety, depression, skin lesions or skin rashes.   PAST MEDICAL HISTORY: 1.  Coronary artery disease.  2.  Hypertension.  3.   Hyperlipidemia.  4.  Diabetes.   FAMILY HISTORY: Her father had significant cardiovascular disease and myocardial infarction at an early age. Mother also had hypertension.   SOCIAL HISTORY: He has remote tobacco use. Currently denies alcohol or tobacco use.   ALLERGIES:  As listed.  MEDICATIONS:  As listed.   PHYSICAL EXAMINATION: VITAL SIGNS: Blood pressure is 115/64 bilaterally, heart rate 72 upright, reclining and regular.  GENERAL: He is a well appearing male in no acute distress.  HEENT: No icterus, thyromegaly, ulcers, hemorrhage or xanthelasma.  CARDIOVASCULAR: Regular rate and rhythm with normal S1 and S2 without murmur, gallop or rub. PMI is normal size and placement. Carotid upstroke normal without bruit. Jugular venous pressure is normal.  LUNGS: Lungs have few basilar crackles with normal respirations.  ABDOMEN: Soft, nontender, without hepatosplenomegaly or masses. Abdominal aorta is normal size without bruit.  EXTREMITIES: Show 2+ bilateral pulses in dorsal, pedal, radial and femoral arteries without lower extremity edema, cyanosis, clubbing or ulcers.  NEUROLOGIC: He is oriented to time, place, and person with normal mood and affect.   ASSESSMENT: This is a middle-aged male with hypertension, hyperlipidemia, diabetes, coronary artery disease with gastroenteritis, nausea, vomiting, chest pain with elevated troponin of 0.3, most consistent with demand ischemia at this time needing further evaluation.   RECOMMENDATIONS: 1.  Admit to telemetry with telemetry unit nursing with further evaluation of concerns of myocardial infarction or rhythm disturbances. 2.  Ambulation and hydration.  3.  Continue diabetes control with a goal hemoglobin A1c below 7.  4.  Hypertension control with ACE inhibitor for further renal protection.  5.  Lipid management with high intensity cholesterol therapy with current statin.  6.  Consideration of echocardiogram for  LV systolic dysfunction,  valvular heart disease causing current symptoms.  7.  Further diagnostic testing and treatment options based on above.     ____________________________ Lamar Blinks, MD bjk:ct D: 07/01/2012 08:36:09 ET T: 07/01/2012 10:55:29 ET JOB#: [id number]  cc: Lamar Blinks, MD, <Dictator> Lamar Blinks MD ELECTRONICALLY SIGNED 07/13/2012 8:54

## 2014-09-05 NOTE — Discharge Summary (Signed)
PATIENT NAME:  Ivan Larsen, Ivan Larsen MR#:  [id number] DATE OF BIRTH:  [birth year]  DATE OF ADMISSION:  07/01/2012 DATE OF DISCHARGE:  07/03/2012  PRIMARY CARE PHYSICIAN: Dr. Vonita MossMark Crissman.   PRIMARY CARDIOLOGIST: Dr. Gwen PoundsKowalski.   DISCHARGE DIAGNOSES:  1.  Acute myocardial infarction.  2.  History of coronary artery disease, status post stent. 3.  Hypertension. 4.  Hyperlipidemia. 5.  Peripheral vascular disease.   CHIEF COMPLAINT: Nausea, vomiting, diarrhea and chest pain.  HISTORY OF PRESENT ILLNESS: A 59 year old male with past medical history of coronary artery disease, status post stent in 2005, hypertension, hyperlipidemia and diabetes, who presented with complaints of nausea, vomiting and diarrhea. The patient reported these symptoms started the day before the presentation. He also reported that his wife had similar symptoms a week ago that lasted for 2 to 3 days and then after 1 to 2 days, it started with him. He reported multiple episodes of watery diarrhea and a couple of episodes of vomiting and nausea also. He reported after vomiting, he started to have some chest pain related to vomiting,  which was not due to vomit and much improved after that. The patient had basic workup done, which was significant for leukocytosis of 22,000 even though he did not have any fever as well as elevated troponin of 0.35. EKG did not show any significant ST or T wave changes. CT scan of the abdomen and pelvis was done and the patient was being admitted for further evaluation and workup for elevated troponin.   HOSPITAL COURSE AND STAY: For elevated troponin, most likely it was demand ischemia. On admission, we discussed with Dr. Gwen PoundsKowalski about this patient, and he said continue monitoring Troponin and continue aspirin and statin. We added a beta blocker as the patient was not taking it and held heparin drip. His next troponin went to 35 and so Dr. Nemiah CommanderKalisetti spoke to Dr. Gwen PoundsKowalski about this patient. Dr. Gwen PoundsKowalski  planned for the cardiac cath the next day morning. He did the cardiac cath and there was infarct in one of the cardiac walls and blocked RCA with multiple collaterals, so he suggested to monitor the patient for 1 more night due to infarct. The patient remained stable from a cardiac point of view overnight and the next day he was discharged home.   OTHER MEDICAL ISSUES ADDRESSED DURING THIS HOSPITAL STAY:  1.  Aortic atheromatous disease. Dr. Gwen PoundsKowalski noticed, following the cath, there was some problem with the aortic vessel and so he wanted the patient to get a vascular consult before he goes home, though he was not expecting vascular will do any acute emergent workup, but he wanted them to know about him so that he can follow in the clinic later on. I called vascular consult and spoke to Dr. Gilda CreaseSchnier and as he was very busy in the procedures due to the Holiday, he said he might not be having enough time to come and see the patient. If we are not planning to do any procedure, then we can discharge the patient and give an appointment with him as an outpatient, so we followed this recommendation.  2.  Nausea and vomiting. It could be due to cardiac reason or it might be viral gastroenteritis, but the complaint resolved the next day after admission.  3.  Hypertension. We continued home medication. It remained under control.  4.  Diabetes. We held oral medications as the patient was n.p.o. and managed with insulin sliding scale.  5.  Hyperlipidemia. We  continued him on statin.   IMPORTANT CONSULTS: Cardiology, Dr. Gwen Pounds.  LABORATORY RESULTS: Glucose 268, BUN 22, creatinine 1.23. WBC 22.7, hemoglobin 16.0, platelet count 265. Lipase 161. First troponin 0.35. CT scan abdomen and pelvis as mentioned above. Blood culture no growth. Hepatitis A, B and C panel. Second troponin came up to 35. Cardiac cath was done and the report is as follows: Acute myocardial infarction, mild inferior left ventricular  dysfunction,  occluded RCA with good collateral, LAD stenosis at previous stent site and significant peripheral vascular disease with distal aorta and iliac stenosis.   CODE STATUS ON DISCHARGE: Full code.   CONDITION ON DISCHARGE: Stable.   DISCHARGE MEDICATIONS: Metformin 1000 mg oral tablet 2 times a day, atorvastatin 10 mg once a day, aspirin 81 mg once a day, lisinopril 10 mg once a day, Clopidogrel 25 mg once a day and metoprolol 25 mg 2 times a day.   HOME HEALTH: No.   HOME OXYGEN: No.   DIET: Low sodium, low fat, low cholesterol, carbohydrate-controlled, ADA diet. Diet consistency: Regular.   ACTIVITY LIMITATION: As tolerated.   TIMEFRAME TO FOLLOW-UP: Within 1 to 2 weeks. He was advised to follow with cardiology and vascular clinic. Addresses provided of Dr. Levora Dredge, [facility], [place] and Dr. Arnoldo Hooker.  TOTAL TIME SPENT: 55 minutes.    ____________________________ Hope Pigeon Elisabeth Pigeon, MD [id number] D: 07/07/2012 23:27:12 ET T: 07/08/2012 08:32:54 ET JOB#: [id number]  cc: Hope Pigeon. Elisabeth Pigeon, MD, <Dictator> Renford Dills, MD Lamar Blinks, MD Steele Sizer, MD Altamese Dilling MD ELECTRONICALLY SIGNED 07/15/2012 23:00

## 2014-09-05 NOTE — H&P (Signed)
PATIENT NAME:  Ivan Larsen, [name] MR#:  [id number] DATE OF BIRTH:  11/08/1955  DATE OF ADMISSION:  07/01/2012  REFERRING PHYSICIAN: Dr. Suella BroadLinda Taylor.   PRIMARY CARE PHYSICIAN: Dr. Vonita MossMark Crissman.  PRIMARY CARDIOLOGIST: Dr. Gwen PoundsKowalski.   CHIEF COMPLAINT: Nausea, vomiting, diarrhea and chest pain.   HISTORY OF PRESENT ILLNESS: This 59 year old male with known past medical history of coronary artery disease is status post stenting in 2005, hypertension, hyperlipidemia, diabetes mellitus, who presents with complaint of nausea, vomiting and diarrhea. The patient reports these symptoms started yesterday. Reports wife had these symptoms last Thursday that lasted for 2 days it started with him yesterday. He reports multiple episodes of watery diarrhea, a couple of episodes of vomiting and nausea. He reports after vomiting he started to have some chest pain related to vomiting, worsened due to vomiting and much improved after that. The patient had basic workup done, which was significant for leukocytosis of 22,000 even though he did not have any fever. As well. he had elevated troponin at 0.35. EKG did not show any significant ST or T wave changes. The patient had CT of abdomen and pelvis done with results still pending. Hospitalist service was requested to admit the patient for further evaluation and workup of his symptoms and elevated troponin.   PAST MEDICAL HISTORY:  1.  Coronary artery disease, status post subendocardial myocardial infarction with stent placement in LAD and distal RCA in September 2005. 2.  Hypertension.  3.  Hyperlipidemia.  4.  Diabetes mellitus.   ALLERGIES: CONTRAST.   HOME MEDICATIONS:  1.  Aspirin 81 mg oral daily. 2.  Metformin 1 gram oral 2 times a day.  3.  Atorvastatin 10 mg at bedtime.  4.  Tribenzor 10/08/38 mg oral daily.   FAMILY HISTORY: Father died of MI and at age 60. Mother died of diabetes and heart failure at 3782.   SOCIAL HISTORY: He used to smoke 1 to 2 packs  every day for 30 years, quit in September 2005. No alcohol or illicit drug use.   REVIEW OF SYSTEMS: CONSTITUTIONAL: Denies any fever, fatigue, weakness or chills.  EYES: Denies blurry vision, double vision, pain, inflammation or glaucoma.  ENT: Denies tinnitus, ear pain, hearing loss, epistaxis or discharge.  RESPIRATORY: Denies cough, wheezing, hemoptysis, dyspnea, COPD or pneumonia.  CARDIOVASCULAR: Has chest pain. Denies any orthopnea, edema, arrhythmia, palpitations or syncope.  GASTROINTESTINAL: Complains of nausea, vomiting and diarrhea. Denies abdominal pain, hematemesis, rectal bleed, coffee-ground emesis or bright red blood per rectum.  GENITOURINARY: Denies dysuria, hematuria or renal colic.  ENDOCRINE: Denies polyuria, polydipsia, heat or cold intolerance.  HEMATOLOGY: Denies anemia, easy bruising or bleeding diathesis.  INTEGUMENTARY: Denies acne, rash or skin lesions.  MUSCULOSKELETAL: Denies neck pain, shoulder pain, arthritis, cramps, limited activity or gout.  NEUROLOGIC: Denies numbness, vertigo, ataxia, dementia, CVA, TIA or seizures.  PSYCHIATRIC: Denies anxiety, insomnia, schizophrenia, nervousness, bipolar disorder or depression.   PHYSICAL EXAMINATION:  VITAL SIGNS: Temperature 98, pulse 102, respiratory rate 18, blood pressure 135/74, saturating 98% on room air.  GENERAL: Well-nourished male, looks comfortable in bed in no apparent distress.  HEENT: Head atraumatic, normocephalic. Pupils equal, reactive to light. Pink conjunctivae. Anicteric sclerae. Moist oral mucosa.  NECK: Supple. No thyromegaly. No JVD.  CHEST: Good air entry bilaterally. No wheezing, rales or rhonchi.  CARDIOVASCULAR: S1 and S2 heard. No rubs, murmur, or gallops.  ABDOMEN: Soft, nontender, nondistended. Bowel sounds present.  EXTREMITIES: No edema. No clubbing. No cyanosis.  PSYCHIATRIC: Appropriate affect. Awake,  alert x 3. Intact judgment and insight.  NEUROLOGIC: Motor 5 out of 5. No focal  deficits.   PERTINENT LABORATORIES: Glucose 268, BUN 22, creatinine 1.23, sodium 138, potassium 4.4, chloride 101, CO2 22, ALT 87, AST 44. Alkaline phosphatase 59. Troponin 0.35. White blood cells 22.7, hemoglobin 16, hematocrit 47.6, platelets 265. INR 1. EKG showing normal sinus rhythm at 100 beats per minute without significant ST or T wave changes.   ASSESSMENT AND PLAN:  1.  Elevated troponin. This is most likely demand ischemia, especially with this chest pain that has non-typical features introduced by vomiting and relieved with no vomiting. Discussed with Dr. Gwen Pounds and at this point we will continue to monitor his troponins. We will cycle him every 8 hours. We will continue to follow the trend. Given aspirin, he is already on statin. We will add beta blockers. We will hold on heparin drip. Consulted cardiology service, Dr. Gwen Pounds.  2.  Hypertension. Blood pressure is borderline. We will add beta blockers. Will continue with ARB inhibitor. We will hold the Norvasc and hydrochlorothiazide.  3.  Hyperlipidemia. We will continue with statin. Will keep close eye on liver function tests. If they continue to increase, we will stop statin.  4.  Diabetes mellitus. Will hold oral metformin. We will continue on insulin sliding scale.  5.  Elevated liver function tests. We will check hepatitis panel especially with the patient's symptoms of nausea, vomiting and diarrhea.  6.  Deep vein thrombosis prophylaxis with subcutaneous heparin.  7.  Gastrointestinal prophylaxis, on Protonix.  8.  CODE STATUS: Full code.   TOTAL TIME SPENT ON ADMISSION AND PATIENT CARE: 55 minutes.   ____________________________ Starleen Arms, MD dse:aw D: 07/01/2012 08:05:53 ET T: 07/01/2012 08:54:58 ET JOB#: [id number]  cc: Starleen Arms, MD, <Dictator> Velma Agnes Teena Irani MD ELECTRONICALLY SIGNED 07/02/2012 3:48

## 2014-09-29 DIAGNOSIS — E1159 Type 2 diabetes mellitus with other circulatory complications: Secondary | ICD-10-CM | POA: Insufficient documentation

## 2014-09-29 DIAGNOSIS — I10 Essential (primary) hypertension: Secondary | ICD-10-CM | POA: Insufficient documentation

## 2014-09-29 DIAGNOSIS — I152 Hypertension secondary to endocrine disorders: Secondary | ICD-10-CM | POA: Insufficient documentation

## 2014-12-01 ENCOUNTER — Ambulatory Visit (INDEPENDENT_AMBULATORY_CARE_PROVIDER_SITE_OTHER): Payer: BLUE CROSS/BLUE SHIELD | Admitting: Family Medicine

## 2014-12-01 ENCOUNTER — Encounter: Payer: Self-pay | Admitting: Family Medicine

## 2014-12-01 VITALS — BP 125/67 | HR 56 | Temp 98.5°F | Ht 65.5 in | Wt 162.0 lb

## 2014-12-01 DIAGNOSIS — E785 Hyperlipidemia, unspecified: Secondary | ICD-10-CM

## 2014-12-01 DIAGNOSIS — I10 Essential (primary) hypertension: Secondary | ICD-10-CM | POA: Diagnosis not present

## 2014-12-01 DIAGNOSIS — I2510 Atherosclerotic heart disease of native coronary artery without angina pectoris: Secondary | ICD-10-CM | POA: Diagnosis not present

## 2014-12-01 DIAGNOSIS — E119 Type 2 diabetes mellitus without complications: Secondary | ICD-10-CM

## 2014-12-01 DIAGNOSIS — Z23 Encounter for immunization: Secondary | ICD-10-CM | POA: Diagnosis not present

## 2014-12-01 DIAGNOSIS — I739 Peripheral vascular disease, unspecified: Secondary | ICD-10-CM

## 2014-12-01 DIAGNOSIS — Z0000 Encounter for general adult medical examination without abnormal findings: Secondary | ICD-10-CM

## 2014-12-01 DIAGNOSIS — I2583 Coronary atherosclerosis due to lipid rich plaque: Secondary | ICD-10-CM

## 2014-12-01 LAB — MICROSCOPIC EXAMINATION: EPITHELIAL CELLS (NON RENAL): NONE SEEN /HPF (ref 0–10)

## 2014-12-01 LAB — URINALYSIS, ROUTINE W REFLEX MICROSCOPIC
BILIRUBIN UA: NEGATIVE
Ketones, UA: NEGATIVE
LEUKOCYTES UA: NEGATIVE
Nitrite, UA: NEGATIVE
Specific Gravity, UA: 1.005 (ref 1.005–1.030)
Urobilinogen, Ur: 0.2 mg/dL (ref 0.2–1.0)
pH, UA: 5 (ref 5.0–7.5)

## 2014-12-01 LAB — BAYER DCA HB A1C WAIVED: HB A1C (BAYER DCA - WAIVED): 8.2 % — ABNORMAL HIGH

## 2014-12-01 MED ORDER — METOPROLOL TARTRATE 50 MG PO TABS: 50.0000 mg | ORAL_TABLET | Freq: Two times a day (BID) | ORAL | Status: DC

## 2014-12-01 MED ORDER — SAXAGLIPTIN HCL 5 MG PO TABS: 5.0000 mg | ORAL_TABLET | Freq: Every day | ORAL | Status: DC

## 2014-12-01 MED ORDER — HYDROCHLOROTHIAZIDE 25 MG PO TABS
25.0000 mg | ORAL_TABLET | Freq: Every day | ORAL | Status: DC
Start: 2014-12-01 — End: 2015-06-16

## 2014-12-01 MED ORDER — METFORMIN HCL 500 MG PO TABS
1000.0000 mg | ORAL_TABLET | Freq: Two times a day (BID) | ORAL | Status: DC
Start: 2014-12-01 — End: 2015-06-10

## 2014-12-01 MED ORDER — DAPAGLIFLOZIN PROPANEDIOL 10 MG PO TABS: 10.0000 mg | ORAL_TABLET | Freq: Every day | ORAL | Status: DC

## 2014-12-01 MED ORDER — ATORVASTATIN CALCIUM 40 MG PO TABS: 40.0000 mg | ORAL_TABLET | Freq: Every day | ORAL | Status: DC

## 2014-12-01 MED ORDER — CLOPIDOGREL BISULFATE 75 MG PO TABS: 75.0000 mg | ORAL_TABLET | Freq: Every day | ORAL | Status: DC

## 2014-12-01 NOTE — Assessment & Plan Note (Signed)
The current medical regimen is effective;  continue present plan and medications.  

## 2014-12-01 NOTE — Assessment & Plan Note (Signed)
Pending labs

## 2014-12-01 NOTE — Progress Notes (Signed)
BP 125/67 mmHg  Pulse 56  Temp(Src) 98.5 F (36.9 C)  Ht 5' 5.5" (1.664 m)  Wt 162 lb (73.483 kg)  BMI 26.54 kg/m2  SpO2 96%   Subjective:    Patient ID: Ivan Larsen, male    DOB: 1955/10/31, 59 y.o.   MRN: [id number]  HPI: Ivan Larsen is a 59 y.o. male  Chief Complaint  Patient presents with  . Annual Exam   Medical problems stable  Doing well Stopped lisinopril due to side effects Cardiology changed to amlodipine and doing well  Relevant past medical, surgical, family and social history reviewed and updated as indicated. Interim medical history since our last visit reviewed. Allergies and medications reviewed and updated.  Review of Systems  Constitutional: Negative.   HENT: Negative.   Eyes: Negative.   Respiratory: Negative.   Cardiovascular: Negative.   Endocrine: Negative.   Musculoskeletal: Negative.   Skin: Negative.   Allergic/Immunologic: Negative.   Neurological: Negative.   Hematological: Negative.   Psychiatric/Behavioral: Negative.     Per HPI unless specifically indicated above     Objective:    BP 125/67 mmHg  Pulse 56  Temp(Src) 98.5 F (36.9 C)  Ht 5' 5.5" (1.664 m)  Wt 162 lb (73.483 kg)  BMI 26.54 kg/m2  SpO2 96%  Wt Readings from Last 3 Encounters:  12/01/14 162 lb (73.483 kg)  09/01/14 165 lb (74.844 kg)    Physical Exam  Constitutional: He is oriented to person, place, and time. He appears well-developed and well-nourished.  HENT:  Head: Normocephalic and atraumatic.  Right Ear: External ear normal.  Left Ear: External ear normal.  Eyes: Conjunctivae and EOM are normal. Pupils are equal, round, and reactive to light.  Neck: Normal range of motion. Neck supple.  Cardiovascular: Normal rate, regular rhythm, normal heart sounds and intact distal pulses.   Pulmonary/Chest: Effort normal and breath sounds normal.  Abdominal: Soft. Bowel sounds are normal. There is no splenomegaly or hepatomegaly.  Genitourinary:  Rectum normal, prostate normal and penis normal.  Musculoskeletal: Normal range of motion.       Right ankle: He exhibits abnormal pulse.       Left ankle: He exhibits normal pulse.  Neurological: He is alert and oriented to person, place, and time. He has normal reflexes.  Skin: No rash noted. No erythema.  Psychiatric: He has a normal mood and affect. His behavior is normal. Judgment and thought content normal.        Assessment & Plan:   Problem List Items Addressed This Visit      Cardiovascular and Mediastinum   Hypertension    The current medical regimen is effective;  continue present plan and medications.       Relevant Medications   amLODipine (NORVASC) 5 MG tablet   metoprolol (LOPRESSOR) 50 MG tablet   hydrochlorothiazide (HYDRODIURIL) 25 MG tablet   atorvastatin (LIPITOR) 40 MG tablet   CAD (coronary artery disease)   Relevant Medications   amLODipine (NORVASC) 5 MG tablet   metoprolol (LOPRESSOR) 50 MG tablet   hydrochlorothiazide (HYDRODIURIL) 25 MG tablet   clopidogrel (PLAVIX) 75 MG tablet   atorvastatin (LIPITOR) 40 MG tablet   PAD (peripheral artery disease)   Relevant Medications   amLODipine (NORVASC) 5 MG tablet   metoprolol (LOPRESSOR) 50 MG tablet   hydrochlorothiazide (HYDRODIURIL) 25 MG tablet   atorvastatin (LIPITOR) 40 MG tablet     Endocrine   Diabetes mellitus without complication    Diabetes  poor control will increase metformin 1000 mg twice a day after increasing to 1500 mg for a week Add Onglyza Patient education on better diet and exercise nutrition      Relevant Medications   metFORMIN (GLUCOPHAGE) 500 MG tablet   dapagliflozin propanediol (FARXIGA) 10 MG TABS tablet   atorvastatin (LIPITOR) 40 MG tablet   saxagliptin HCl (ONGLYZA) 5 MG TABS tablet   Other Relevant Orders   Bayer DCA Hb A1c Waived     Other   Hyperlipidemia    Pending labs      Relevant Medications   amLODipine (NORVASC) 5 MG tablet   metoprolol  (LOPRESSOR) 50 MG tablet   hydrochlorothiazide (HYDRODIURIL) 25 MG tablet   atorvastatin (LIPITOR) 40 MG tablet    Other Visit Diagnoses    Immunization due    -  Primary    Relevant Orders    Tdap vaccine greater than or equal to 7yo IM (Completed)    PE (physical exam), annual        Relevant Orders    CBC with Differential/Platelet    Comprehensive metabolic panel    Urinalysis, Routine w reflex microscopic (not at [facility])    TSH    PSA    Lipid panel        Follow up plan: Return in about 3 months (around 03/03/2015), or if symptoms worsen or fail to improve, for DM and A1c.

## 2014-12-01 NOTE — Assessment & Plan Note (Signed)
Diabetes poor control will increase metformin 1000 mg twice a day after increasing to 1500 mg for a week Add Onglyza Patient education on better diet and exercise nutrition

## 2014-12-02 LAB — CBC WITH DIFFERENTIAL/PLATELET
Basophils Absolute: 0.1 10*3/uL (ref 0.0–0.2)
Basos: 1 %
EOS (ABSOLUTE): 0.3 10*3/uL (ref 0.0–0.4)
Eos: 3 %
Hematocrit: 46.4 % (ref 37.5–51.0)
Hemoglobin: 15 g/dL (ref 12.6–17.7)
IMMATURE GRANULOCYTES: 0 %
Immature Grans (Abs): 0 10*3/uL (ref 0.0–0.1)
Lymphocytes Absolute: 2.2 10*3/uL (ref 0.7–3.1)
Lymphs: 21 %
MCH: 28.4 pg (ref 26.6–33.0)
MCHC: 32.3 g/dL (ref 31.5–35.7)
MCV: 88 fL (ref 79–97)
Monocytes Absolute: 0.9 10*3/uL (ref 0.1–0.9)
Monocytes: 9 %
NEUTROS ABS: 6.8 10*3/uL (ref 1.4–7.0)
NEUTROS PCT: 66 %
Platelets: 256 10*3/uL (ref 150–379)
RBC: 5.29 x10E6/uL (ref 4.14–5.80)
RDW: 13.9 % (ref 12.3–15.4)
WBC: 10.3 10*3/uL (ref 3.4–10.8)

## 2014-12-02 LAB — COMPREHENSIVE METABOLIC PANEL
A/G RATIO: 2.1 (ref 1.1–2.5)
ALBUMIN: 4.6 g/dL (ref 3.6–4.8)
ALK PHOS: 74 IU/L (ref 39–117)
ALT: 49 IU/L — ABNORMAL HIGH (ref 0–44)
AST: 27 IU/L (ref 0–40)
BUN/Creatinine Ratio: 11 (ref 10–22)
BUN: 10 mg/dL (ref 8–27)
Bilirubin Total: 0.4 mg/dL (ref 0.0–1.2)
CALCIUM: 9.8 mg/dL (ref 8.6–10.2)
CHLORIDE: 97 mmol/L (ref 97–108)
CO2: 25 mmol/L (ref 18–29)
Creatinine, Ser: 0.88 mg/dL (ref 0.76–1.27)
GFR, EST AFRICAN AMERICAN: 108 mL/min/{1.73_m2} (ref >59)
GFR, EST NON AFRICAN AMERICAN: 93 mL/min/{1.73_m2} (ref >59)
Globulin, Total: 2.2 g/dL (ref 1.5–4.5)
Glucose: 230 mg/dL — ABNORMAL HIGH (ref 65–99)
Potassium: 4.3 mmol/L (ref 3.5–5.2)
SODIUM: 142 mmol/L (ref 134–144)
Total Protein: 6.8 g/dL (ref 6.0–8.5)

## 2014-12-02 LAB — LIPID PANEL
CHOL/HDL RATIO: 3.1 ratio (ref 0.0–5.0)
Cholesterol, Total: 102 mg/dL (ref 100–199)
HDL: 33 mg/dL — ABNORMAL LOW (ref >39)
LDL Calculated: 28 mg/dL (ref 0–99)
TRIGLYCERIDES: 204 mg/dL — AB (ref 0–149)
VLDL Cholesterol Cal: 41 mg/dL — ABNORMAL HIGH (ref 5–40)

## 2014-12-02 LAB — PSA: PROSTATE SPECIFIC AG, SERUM: 0.8 ng/mL (ref 0.0–4.0)

## 2014-12-02 LAB — TSH: TSH: 1.37 u[IU]/mL (ref 0.450–4.500)

## 2015-03-03 ENCOUNTER — Ambulatory Visit (INDEPENDENT_AMBULATORY_CARE_PROVIDER_SITE_OTHER): Payer: BLUE CROSS/BLUE SHIELD | Admitting: Family Medicine

## 2015-03-03 ENCOUNTER — Encounter: Payer: Self-pay | Admitting: Family Medicine

## 2015-03-03 VITALS — BP 147/72 | HR 67 | Temp 98.5°F | Ht 66.0 in | Wt 165.0 lb

## 2015-03-03 DIAGNOSIS — E119 Type 2 diabetes mellitus without complications: Secondary | ICD-10-CM | POA: Diagnosis not present

## 2015-03-03 DIAGNOSIS — I10 Essential (primary) hypertension: Secondary | ICD-10-CM | POA: Diagnosis not present

## 2015-03-03 LAB — MICROALBUMIN, URINE WAIVED
Creatinine, Urine Waived: 100 mg/dL (ref 10–300)
Microalb, Ur Waived: 150 mg/L — ABNORMAL HIGH (ref 0–19)
Microalb/Creat Ratio: >300 mg/g — ABNORMAL HIGH (ref <30)

## 2015-03-03 LAB — BAYER DCA HB A1C WAIVED: HB A1C (BAYER DCA - WAIVED): 8 % — ABNORMAL HIGH (ref <7.0)

## 2015-03-03 MED ORDER — ALBIGLUTIDE 30 MG ~~LOC~~ PEN: 30.0000 mg | PEN_INJECTOR | SUBCUTANEOUS | Status: DC

## 2015-03-03 MED ORDER — BENAZEPRIL HCL 40 MG PO TABS: 40.0000 mg | ORAL_TABLET | Freq: Every day | ORAL | Status: DC

## 2015-03-03 MED ORDER — AMLODIPINE BESYLATE 5 MG PO TABS: 5.0000 mg | ORAL_TABLET | Freq: Every day | ORAL | Status: DC

## 2015-03-03 NOTE — Assessment & Plan Note (Signed)
Discuss poor control of blood pressure will start benazepril 40 mg chosen for renal protection with diabetes.

## 2015-03-03 NOTE — Assessment & Plan Note (Signed)
Poor control of diabetes discussed better diet exercise nutrition weight loss Start weekly [place] Patient education given on how to use the pen

## 2015-03-03 NOTE — Progress Notes (Signed)
BP 147/72 mmHg  Pulse 67  Temp(Src) 98.5 F (36.9 C)  Ht 5\' 6"  (1.676 m)  Wt 165 lb (74.844 kg)  BMI 26.64 kg/m2  SpO2 98%   Subjective:    Patient ID: Ivan Larsen, male    DOB: Mar 06, 1956, 59 y.o.   MRN: [id number]  HPI: Ivan Larsen is a 59 y.o. male  Chief Complaint  Patient presents with  . Diabetes   patient's been doing well all in all Blood sugars been doing well checking mostly in the mornings blood sugars mid to lower 100s noted low blood sugar spells  Blood pressures been doing well with no complaints from medications  Cholesterol doing well with no complaints from medications  Relevant past medical, surgical, family and social history reviewed and updated as indicated. Interim medical history since our last visit reviewed. Allergies and medications reviewed and updated.  Review of Systems  Constitutional: Negative.   Respiratory: Negative.   Cardiovascular: Negative.     Per HPI unless specifically indicated above     Objective:    BP 147/72 mmHg  Pulse 67  Temp(Src) 98.5 F (36.9 C)  Ht 5\' 6"  (1.676 m)  Wt 165 lb (74.844 kg)  BMI 26.64 kg/m2  SpO2 98%  Wt Readings from Last 3 Encounters:  03/03/15 165 lb (74.844 kg)  12/01/14 162 lb (73.483 kg)  09/01/14 165 lb (74.844 kg)    Physical Exam  Constitutional: He is oriented to person, place, and time. He appears well-developed and well-nourished. No distress.  HENT:  Head: Normocephalic and atraumatic.  Right Ear: Hearing normal.  Left Ear: Hearing normal.  Nose: Nose normal.  Eyes: Conjunctivae and lids are normal. Right eye exhibits no discharge. Left eye exhibits no discharge. No scleral icterus.  Cardiovascular: Normal rate, regular rhythm and normal heart sounds.   Pulmonary/Chest: Effort normal and breath sounds normal. No respiratory distress.  Musculoskeletal: Normal range of motion.  Neurological: He is alert and oriented to person, place, and time.  Skin: Skin is intact.  No rash noted.  Psychiatric: He has a normal mood and affect. His speech is normal and behavior is normal. Judgment and thought content normal. Cognition and memory are normal.    Results for orders placed or performed in visit on 03/03/15  Bayer DCA Hb A1c Waived  Result Value Ref Range   Bayer DCA Hb A1c Waived 8.0 (H) <7.0 %  Microalbumin, Urine Waived  Result Value Ref Range   Microalb, Ur Waived 150 (H) 0 - 19 mg/L   Creatinine, Urine Waived 100 10 - 300 mg/dL   Microalb/Creat Ratio >300 (H) <30 mg/g      Assessment & Plan:   Problem List Items Addressed This Visit      Cardiovascular and Mediastinum   Hypertension    Discuss poor control of blood pressure will start benazepril 40 mg chosen for renal protection with diabetes.      Relevant Medications   amLODipine (NORVASC) 5 MG tablet   benazepril (LOTENSIN) 40 MG tablet     Endocrine   Diabetes mellitus without complication (HCC) - Primary    Poor control of diabetes discussed better diet exercise nutrition weight loss Start weekly [place] Patient education given on how to use the pen       Relevant Medications   Albiglutide 30 MG PEN   benazepril (LOTENSIN) 40 MG tablet   Other Relevant Orders   Bayer DCA Hb A1c Waived (Completed)   Microalbumin, Urine  Waived (Completed)       Follow up plan: Return in about 4 weeks (around 03/31/2015) for One month to review changes in medications will check BMP for renal function.

## 2015-04-01 ENCOUNTER — Ambulatory Visit (INDEPENDENT_AMBULATORY_CARE_PROVIDER_SITE_OTHER): Payer: BLUE CROSS/BLUE SHIELD | Admitting: Family Medicine

## 2015-04-01 ENCOUNTER — Encounter: Payer: Self-pay | Admitting: Family Medicine

## 2015-04-01 VITALS — BP 143/73 | HR 66 | Temp 98.5°F | Ht 66.3 in | Wt 162.0 lb

## 2015-04-01 DIAGNOSIS — E119 Type 2 diabetes mellitus without complications: Secondary | ICD-10-CM | POA: Diagnosis not present

## 2015-04-01 DIAGNOSIS — I10 Essential (primary) hypertension: Secondary | ICD-10-CM

## 2015-04-01 MED ORDER — AMLODIPINE BESYLATE 10 MG PO TABS: 10.0000 mg | ORAL_TABLET | Freq: Every day | ORAL | Status: DC

## 2015-04-01 NOTE — Assessment & Plan Note (Signed)
Better control but not complete control will increase Norvasc from 5 mg to 10 mg recheck blood pressure In 2 months.

## 2015-04-01 NOTE — Assessment & Plan Note (Signed)
Discussed importance of good control with patient for diabetes will start injectable Discussed risks benefits

## 2015-04-01 NOTE — Progress Notes (Signed)
BP 143/73 mmHg  Pulse 66  Temp(Src) 98.5 F (36.9 C)  Ht 5' 6.3" (1.684 m)  Wt 162 lb (73.483 kg)  BMI 25.91 kg/m2  SpO2 99%   Subjective:    Patient ID: Ivan Larsen, male    DOB: [birth year], 59 y.o.   MRN: [id number]  HPI: Ivan Larsen is a 59 y.o. male  Chief Complaint  Patient presents with  . Hypertension   follow-up hypertension patient's started new medication with no side effects taking medications faithfully Blood pressure on home monitoring still up has had a lot of stress his wife is been in the hospital pretty much the entire month. May be coming home soon.  Has not started to albiglutide pen due to above home pressures   Relevant past medical, surgical, family and social history reviewed and updated as indicated. Interim medical history since our last visit reviewed. Allergies and medications reviewed and updated.  Review of Systems  Constitutional: Negative.   Respiratory: Negative.   Cardiovascular: Negative.     Per HPI unless specifically indicated above     Objective:    BP 143/73 mmHg  Pulse 66  Temp(Src) 98.5 F (36.9 C)  Ht 5' 6.3" (1.684 m)  Wt 162 lb (73.483 kg)  BMI 25.91 kg/m2  SpO2 99%  Wt Readings from Last 3 Encounters:  04/01/15 162 lb (73.483 kg)  03/03/15 165 lb (74.844 kg)  12/01/14 162 lb (73.483 kg)    Physical Exam  Constitutional: He is oriented to person, place, and time. He appears well-developed and well-nourished. No distress.  HENT:  Head: Normocephalic and atraumatic.  Right Ear: Hearing normal.  Left Ear: Hearing normal.  Nose: Nose normal.  Eyes: Conjunctivae and lids are normal. Right eye exhibits no discharge. Left eye exhibits no discharge. No scleral icterus.  Cardiovascular: Normal rate, regular rhythm and normal heart sounds.   Pulmonary/Chest: Effort normal and breath sounds normal. No respiratory distress.  Musculoskeletal: Normal range of motion.  Neurological: He is alert and oriented to  person, place, and time.  Skin: Skin is intact. No rash noted.  Psychiatric: He has a normal mood and affect. His speech is normal and behavior is normal. Judgment and thought content normal. Cognition and memory are normal.    Results for orders placed or performed in visit on 03/03/15  Bayer DCA Hb A1c Waived  Result Value Ref Range   Bayer DCA Hb A1c Waived 8.0 (H) <7.0 %  Microalbumin, Urine Waived  Result Value Ref Range   Microalb, Ur Waived 150 (H) 0 - 19 mg/L   Creatinine, Urine Waived 100 10 - 300 mg/dL   Microalb/Creat Ratio >300 (H) <30 mg/g      Assessment & Plan:   Problem List Items Addressed This Visit      Cardiovascular and Mediastinum   Hypertension    Better control but not complete control will increase Norvasc from 5 mg to 10 mg recheck blood pressure In 2 months.      Relevant Medications   amLODipine (NORVASC) 10 MG tablet     Endocrine   Diabetes mellitus without complication (HCC)    Discussed importance of good control with patient for diabetes will start injectable Discussed risks benefits       Other Visit Diagnoses    Essential hypertension, benign    -  Primary    Relevant Medications    amLODipine (NORVASC) 10 MG tablet    Other Relevant Orders  Basic metabolic panel        Follow up plan: Return in about 2 months (around 06/01/2015) for Recheck hemoglobin A1c, BMP.

## 2015-04-02 ENCOUNTER — Encounter: Payer: Self-pay | Admitting: Family Medicine

## 2015-04-02 LAB — BASIC METABOLIC PANEL
BUN / CREAT RATIO: 17 (ref 10–22)
BUN: 16 mg/dL (ref 8–27)
CHLORIDE: 97 mmol/L (ref 97–106)
CO2: 26 mmol/L (ref 18–29)
Calcium: 10.2 mg/dL (ref 8.6–10.2)
Creatinine, Ser: 0.94 mg/dL (ref 0.76–1.27)
GFR, EST AFRICAN AMERICAN: 101 mL/min/{1.73_m2} (ref >59)
GFR, EST NON AFRICAN AMERICAN: 88 mL/min/{1.73_m2} (ref >59)
Glucose: 132 mg/dL — ABNORMAL HIGH (ref 65–99)
POTASSIUM: 4.3 mmol/L (ref 3.5–5.2)
Sodium: 142 mmol/L (ref 136–144)

## 2015-06-06 ENCOUNTER — Other Ambulatory Visit: Payer: Self-pay | Admitting: Family Medicine

## 2015-06-07 NOTE — Telephone Encounter (Signed)
Oct and Nov 2016 labs reviewed; appt later this month;  Rx requested 10 pills only, but I authorized 30; I supposed he can choose to fill less if desired (?) Rx approved for 30 pills

## 2015-06-10 ENCOUNTER — Other Ambulatory Visit: Payer: Self-pay | Admitting: Family Medicine

## 2015-06-10 MED ORDER — METFORMIN HCL 500 MG PO TABS: 1000.0000 mg | ORAL_TABLET | Freq: Two times a day (BID) | ORAL | Status: DC

## 2015-06-10 NOTE — Telephone Encounter (Signed)
Nov 2016 BMP reviewed; Rx approved 

## 2015-06-13 ENCOUNTER — Other Ambulatory Visit: Payer: Self-pay | Admitting: Family Medicine

## 2015-06-16 ENCOUNTER — Encounter: Payer: Self-pay | Admitting: Family Medicine

## 2015-06-16 ENCOUNTER — Ambulatory Visit (INDEPENDENT_AMBULATORY_CARE_PROVIDER_SITE_OTHER): Payer: BLUE CROSS/BLUE SHIELD | Admitting: Family Medicine

## 2015-06-16 VITALS — BP 138/68 | HR 76 | Temp 98.6°F | Ht 66.0 in | Wt 162.0 lb

## 2015-06-16 DIAGNOSIS — I10 Essential (primary) hypertension: Secondary | ICD-10-CM | POA: Diagnosis not present

## 2015-06-16 DIAGNOSIS — I2583 Coronary atherosclerosis due to lipid rich plaque: Secondary | ICD-10-CM

## 2015-06-16 DIAGNOSIS — E785 Hyperlipidemia, unspecified: Secondary | ICD-10-CM | POA: Diagnosis not present

## 2015-06-16 DIAGNOSIS — E119 Type 2 diabetes mellitus without complications: Secondary | ICD-10-CM

## 2015-06-16 DIAGNOSIS — I2510 Atherosclerotic heart disease of native coronary artery without angina pectoris: Secondary | ICD-10-CM

## 2015-06-16 LAB — BAYER DCA HB A1C WAIVED: HB A1C (BAYER DCA - WAIVED): 6.6 % (ref <7.0)

## 2015-06-16 MED ORDER — ATORVASTATIN CALCIUM 40 MG PO TABS: 40.0000 mg | ORAL_TABLET | Freq: Every day | ORAL | Status: DC

## 2015-06-16 MED ORDER — CLOPIDOGREL BISULFATE 75 MG PO TABS: 75.0000 mg | ORAL_TABLET | Freq: Every day | ORAL | Status: DC

## 2015-06-16 MED ORDER — DAPAGLIFLOZIN PROPANEDIOL 10 MG PO TABS: 10.0000 mg | ORAL_TABLET | Freq: Every day | ORAL | Status: DC

## 2015-06-16 MED ORDER — METOPROLOL TARTRATE 50 MG PO TABS: ORAL_TABLET | ORAL | Status: DC

## 2015-06-16 MED ORDER — METFORMIN HCL 500 MG PO TABS: 1000.0000 mg | ORAL_TABLET | Freq: Two times a day (BID) | ORAL | Status: DC

## 2015-06-16 MED ORDER — SAXAGLIPTIN HCL 5 MG PO TABS: ORAL_TABLET | ORAL | Status: DC

## 2015-06-16 MED ORDER — HYDROCHLOROTHIAZIDE 25 MG PO TABS: 25.0000 mg | ORAL_TABLET | Freq: Every day | ORAL | Status: DC

## 2015-06-16 MED ORDER — BENAZEPRIL HCL 40 MG PO TABS: 40.0000 mg | ORAL_TABLET | Freq: Every day | ORAL | Status: DC

## 2015-06-16 NOTE — Assessment & Plan Note (Signed)
The current medical regimen is effective;  continue present plan and medications.  

## 2015-06-16 NOTE — Progress Notes (Signed)
BP 138/68 mmHg  Pulse 76  Temp(Src) 98.6 F (37 C)  Ht  (1.676 m)  Wt 162 lb (73.483 kg)  BMI 26.16 kg/m2  SpO2 98%   Subjective:    Patient ID: Ivan Larsen, male    DOB: [birth year], 60 y.o.   MRN: [id number]  HPI: Ivan Larsen is a 60 y.o. male  Chief Complaint  Patient presents with  . Hypertension  . Diabetes    recheck blood pressure doing well checks blood pressure at home has good readings noted low blood sugar spells symptoms Diabetes never got [place] due to insurance concerns but decided his Dyazide been liberalized to much and starts eating better and is doing better lost a few pounds and feels better blood sugars been doing real well Relevant past medical, surgical, family and social history reviewed and updated as indicated. Interim medical history since our last visit reviewed. Allergies and medications reviewed and updated.  Review of Systems  Constitutional: Negative.   Respiratory: Negative.   Cardiovascular: Negative.     Per HPI unless specifically indicated above     Objective:    BP 138/68 mmHg  Pulse 76  Temp(Src) 98.6 F (37 C)  Ht  (1.676 m)  Wt 162 lb (73.483 kg)  BMI 26.16 kg/m2  SpO2 98%  Wt Readings from Last 3 Encounters:  06/16/15 162 lb (73.483 kg)  04/01/15 162 lb (73.483 kg)  03/03/15 165 lb (74.844 kg)    Physical Exam  Constitutional: He is oriented to person, place, and time. He appears well-developed and well-nourished. No distress.  HENT:  Head: Normocephalic and atraumatic.  Right Ear: Hearing normal.  Left Ear: Hearing normal.  Nose: Nose normal.  Eyes: Conjunctivae and lids are normal. Right eye exhibits no discharge. Left eye exhibits no discharge. No scleral icterus.  Cardiovascular: Normal rate, regular rhythm and normal heart sounds.   Pulmonary/Chest: Effort normal and breath sounds normal. No respiratory distress.  Musculoskeletal: Normal range of motion.  Neurological: He is alert and  oriented to person, place, and time.  Skin: Skin is intact. No rash noted.  Psychiatric: He has a normal mood and affect. His speech is normal and behavior is normal. Judgment and thought content normal. Cognition and memory are normal.    Results for orders placed or performed in visit on 04/01/15  Basic metabolic panel  Result Value Ref Range   Glucose 132 (H) 65 - 99 mg/dL   BUN 16 8 - 27 mg/dL   Creatinine, Ser 4.09 0.76 - 1.27 mg/dL   GFR calc non Af Amer 88 >59 mL/min/1.73   GFR calc Af Amer 101 >59 mL/min/1.73   BUN/Creatinine Ratio 17 10 - 22   Sodium 142 136 - 144 mmol/L   Potassium 4.3 3.5 - 5.2 mmol/L   Chloride 97 97 - 106 mmol/L   CO2 26 18 - 29 mmol/L   Calcium 10.2 8.6 - 10.2 mg/dL      Assessment & Plan:   Problem List Items Addressed This Visit      Cardiovascular and Mediastinum   Hypertension    The current medical regimen is effective;  continue present plan and medications.       Relevant Medications   atorvastatin (LIPITOR) 40 MG tablet   benazepril (LOTENSIN) 40 MG tablet   hydrochlorothiazide (HYDRODIURIL) 25 MG tablet   metoprolol (LOPRESSOR) 50 MG tablet   CAD (coronary artery disease)   Relevant Medications   atorvastatin (LIPITOR) 40  MG tablet   benazepril (LOTENSIN) 40 MG tablet   clopidogrel (PLAVIX) 75 MG tablet   hydrochlorothiazide (HYDRODIURIL) 25 MG tablet   metoprolol (LOPRESSOR) 50 MG tablet     Endocrine   Diabetes mellitus without complication (HCC) - Primary    The current medical regimen is effective;  continue present plan and medications.       Relevant Medications   atorvastatin (LIPITOR) 40 MG tablet   benazepril (LOTENSIN) 40 MG tablet   dapagliflozin propanediol (FARXIGA) 10 MG TABS tablet   metFORMIN (GLUCOPHAGE) 500 MG tablet   saxagliptin HCl (ONGLYZA) 5 MG TABS tablet   Other Relevant Orders   Bayer DCA Hb A1c Waived     Other   Hyperlipidemia    The current medical regimen is effective;  continue present  plan and medications.       Relevant Medications   atorvastatin (LIPITOR) 40 MG tablet   benazepril (LOTENSIN) 40 MG tablet   hydrochlorothiazide (HYDRODIURIL) 25 MG tablet   metoprolol (LOPRESSOR) 50 MG tablet    Other Visit Diagnoses    Essential hypertension, benign        Relevant Medications    atorvastatin (LIPITOR) 40 MG tablet    benazepril (LOTENSIN) 40 MG tablet    hydrochlorothiazide (HYDRODIURIL) 25 MG tablet    metoprolol (LOPRESSOR) 50 MG tablet    Other Relevant Orders    Basic metabolic panel        Follow up plan: Return in about 3 months (around 09/13/2015) for Physical Exam a1c.

## 2015-06-17 ENCOUNTER — Encounter: Payer: Self-pay | Admitting: Family Medicine

## 2015-06-17 LAB — BASIC METABOLIC PANEL
BUN / CREAT RATIO: 16 (ref 10–22)
BUN: 15 mg/dL (ref 8–27)
CO2: 26 mmol/L (ref 18–29)
CREATININE: 0.95 mg/dL (ref 0.76–1.27)
Calcium: 9.6 mg/dL (ref 8.6–10.2)
Chloride: 93 mmol/L — ABNORMAL LOW (ref 96–106)
GFR, EST AFRICAN AMERICAN: 100 mL/min/{1.73_m2} (ref >59)
GFR, EST NON AFRICAN AMERICAN: 87 mL/min/{1.73_m2} (ref >59)
Glucose: 175 mg/dL — ABNORMAL HIGH (ref 65–99)
Potassium: 3.8 mmol/L (ref 3.5–5.2)
Sodium: 138 mmol/L (ref 134–144)

## 2015-09-15 ENCOUNTER — Ambulatory Visit (INDEPENDENT_AMBULATORY_CARE_PROVIDER_SITE_OTHER): Payer: BLUE CROSS/BLUE SHIELD | Admitting: Family Medicine

## 2015-09-15 ENCOUNTER — Encounter: Payer: Self-pay | Admitting: Family Medicine

## 2015-09-15 VITALS — BP 129/65 | HR 85 | Temp 97.9°F | Ht 66.0 in | Wt 159.0 lb

## 2015-09-15 DIAGNOSIS — I2583 Coronary atherosclerosis due to lipid rich plaque: Secondary | ICD-10-CM

## 2015-09-15 DIAGNOSIS — I739 Peripheral vascular disease, unspecified: Secondary | ICD-10-CM

## 2015-09-15 DIAGNOSIS — I2510 Atherosclerotic heart disease of native coronary artery without angina pectoris: Secondary | ICD-10-CM

## 2015-09-15 DIAGNOSIS — E785 Hyperlipidemia, unspecified: Secondary | ICD-10-CM

## 2015-09-15 DIAGNOSIS — E119 Type 2 diabetes mellitus without complications: Secondary | ICD-10-CM

## 2015-09-15 DIAGNOSIS — I10 Essential (primary) hypertension: Secondary | ICD-10-CM | POA: Diagnosis not present

## 2015-09-15 NOTE — Assessment & Plan Note (Signed)
The current medical regimen is effective;  continue present plan and medications.  

## 2015-09-15 NOTE — Progress Notes (Signed)
BP 129/65 mmHg  Pulse 85  Temp(Src) 97.9 F (36.6 C)  Ht 5\' 6"  (1.676 m)  Wt 159 lb (72.122 kg)  BMI 25.68 kg/m2  SpO2 99%   Subjective:    Patient ID: Ivan Larsen, male    DOB: 06/14/1955, 60 y.o.   MRN: [id number]  HPI: Ivan Larsen is a 60 y.o. male  Chief Complaint  Patient presents with  . Diabetes  Patient recheck doing well noted low blood sugar spells no issues with elevated blood sugars weight is down a couple pounds taking medications faithfully. Also blood pressure cholesterol doing well good blood pressure readings actually lower than checked here and taking medications faithfully with no side effects.   Relevant past medical, surgical, family and social history reviewed and updated as indicated. Interim medical history since our last visit reviewed. Allergies and medications reviewed and updated.  Review of Systems  Constitutional: Negative.   Respiratory: Negative.   Cardiovascular: Negative.     Per HPI unless specifically indicated above     Objective:    BP 129/65 mmHg  Pulse 85  Temp(Src) 97.9 F (36.6 C)  Ht 5\' 6"  (1.676 m)  Wt 159 lb (72.122 kg)  BMI 25.68 kg/m2  SpO2 99%  Wt Readings from Last 3 Encounters:  09/15/15 159 lb (72.122 kg)  06/16/15 162 lb (73.483 kg)  04/01/15 162 lb (73.483 kg)    Physical Exam  Constitutional: He is oriented to person, place, and time. He appears well-developed and well-nourished. No distress.  HENT:  Head: Normocephalic and atraumatic.  Right Ear: Hearing normal.  Left Ear: Hearing normal.  Nose: Nose normal.  Eyes: Conjunctivae and lids are normal. Right eye exhibits no discharge. Left eye exhibits no discharge. No scleral icterus.  Cardiovascular: Normal rate, regular rhythm and normal heart sounds.   Pulmonary/Chest: Effort normal and breath sounds normal. No respiratory distress.  Musculoskeletal: Normal range of motion.  Neurological: He is alert and oriented to person, place, and  time.  Skin: Skin is intact. No rash noted.  Psychiatric: He has a normal mood and affect. His speech is normal and behavior is normal. Judgment and thought content normal. Cognition and memory are normal.    Results for orders placed or performed in visit on 06/16/15  Bayer DCA Hb A1c Waived  Result Value Ref Range   Bayer DCA Hb A1c Waived 6.6 <7.0 %  Basic metabolic panel  Result Value Ref Range   Glucose 175 (H) 65 - 99 mg/dL   BUN 15 8 - 27 mg/dL   Creatinine, Ser 4.090.95 0.76 - 1.27 mg/dL   GFR calc non Af Amer 87 >59 mL/min/1.73   GFR calc Af Amer 100 >59 mL/min/1.73   BUN/Creatinine Ratio 16 10 - 22   Sodium 138 134 - 144 mmol/L   Potassium 3.8 3.5 - 5.2 mmol/L   Chloride 93 (L) 96 - 106 mmol/L   CO2 26 18 - 29 mmol/L   Calcium 9.6 8.6 - 10.2 mg/dL      Assessment & Plan:   Problem List Items Addressed This Visit      Cardiovascular and Mediastinum   Hypertension    The current medical regimen is effective;  continue present plan and medications.       CAD (coronary artery disease)    The current medical regimen is effective;  continue present plan and medications.       PAD (peripheral artery disease) (HCC)    occ cramps  in working hard but all in all doing well        Endocrine   Diabetes mellitus without complication (HCC) - Primary    The current medical regimen is effective;  continue present plan and medications.       Relevant Orders   Bayer DCA Hb A1c Waived     Other   Hyperlipidemia    The current medical regimen is effective;  continue present plan and medications.           Follow up plan: Return in about 3 months (around 12/16/2015) for Physical Exam a1c.

## 2015-09-15 NOTE — Assessment & Plan Note (Signed)
occ cramps in working hard but all in all doing well

## 2015-09-16 LAB — BAYER DCA HB A1C WAIVED: HB A1C: 6.5 % (ref <7.0)

## 2015-11-28 ENCOUNTER — Other Ambulatory Visit: Payer: Self-pay | Admitting: Family Medicine

## 2015-12-22 ENCOUNTER — Ambulatory Visit (INDEPENDENT_AMBULATORY_CARE_PROVIDER_SITE_OTHER): Payer: BLUE CROSS/BLUE SHIELD | Admitting: Family Medicine

## 2015-12-22 ENCOUNTER — Encounter: Payer: Self-pay | Admitting: Family Medicine

## 2015-12-22 VITALS — BP 116/63 | HR 55 | Temp 97.9°F | Ht 66.2 in | Wt 157.0 lb

## 2015-12-22 DIAGNOSIS — I2510 Atherosclerotic heart disease of native coronary artery without angina pectoris: Secondary | ICD-10-CM

## 2015-12-22 DIAGNOSIS — I739 Peripheral vascular disease, unspecified: Secondary | ICD-10-CM

## 2015-12-22 DIAGNOSIS — E119 Type 2 diabetes mellitus without complications: Secondary | ICD-10-CM | POA: Diagnosis not present

## 2015-12-22 DIAGNOSIS — I2583 Coronary atherosclerosis due to lipid rich plaque: Secondary | ICD-10-CM

## 2015-12-22 DIAGNOSIS — E785 Hyperlipidemia, unspecified: Secondary | ICD-10-CM | POA: Diagnosis not present

## 2015-12-22 DIAGNOSIS — I10 Essential (primary) hypertension: Secondary | ICD-10-CM | POA: Diagnosis not present

## 2015-12-22 DIAGNOSIS — Z0000 Encounter for general adult medical examination without abnormal findings: Secondary | ICD-10-CM | POA: Diagnosis not present

## 2015-12-22 LAB — HEMOGLOBIN A1C: Hemoglobin A1C: 7.3

## 2015-12-22 LAB — BAYER DCA HB A1C WAIVED: HB A1C: 7.3 % — AB (ref <7.0)

## 2015-12-22 MED ORDER — METOPROLOL TARTRATE 50 MG PO TABS: ORAL_TABLET | ORAL | 12 refills | Status: DC

## 2015-12-22 MED ORDER — HYDROCHLOROTHIAZIDE 25 MG PO TABS: 25.0000 mg | ORAL_TABLET | Freq: Every day | ORAL | 12 refills | Status: DC

## 2015-12-22 MED ORDER — AMLODIPINE BESYLATE 10 MG PO TABS: 10.0000 mg | ORAL_TABLET | Freq: Every day | ORAL | 12 refills | Status: DC

## 2015-12-22 MED ORDER — CLOPIDOGREL BISULFATE 75 MG PO TABS: 75.0000 mg | ORAL_TABLET | Freq: Every day | ORAL | 12 refills | Status: DC

## 2015-12-22 MED ORDER — DAPAGLIFLOZIN PROPANEDIOL 10 MG PO TABS: 10.0000 mg | ORAL_TABLET | Freq: Every day | ORAL | 12 refills | Status: DC

## 2015-12-22 MED ORDER — ATORVASTATIN CALCIUM 40 MG PO TABS: 40.0000 mg | ORAL_TABLET | Freq: Every day | ORAL | 12 refills | Status: DC

## 2015-12-22 MED ORDER — SAXAGLIPTIN HCL 5 MG PO TABS: ORAL_TABLET | ORAL | 12 refills | Status: DC

## 2015-12-22 MED ORDER — METFORMIN HCL 500 MG PO TABS
1000.0000 mg | ORAL_TABLET | Freq: Two times a day (BID) | ORAL | 12 refills | Status: DC
Start: 2015-12-22 — End: 2016-12-27

## 2015-12-22 MED ORDER — BENAZEPRIL HCL 40 MG PO TABS: 40.0000 mg | ORAL_TABLET | Freq: Every day | ORAL | 12 refills | Status: DC

## 2015-12-22 NOTE — Assessment & Plan Note (Signed)
Increased hemoglobin A1c patient will become more cognizant of diet and exercise and do better

## 2015-12-22 NOTE — Assessment & Plan Note (Signed)
The current medical regimen is effective;  continue present plan and medications.  

## 2015-12-22 NOTE — Progress Notes (Signed)
BP 116/63 (BP Location: Left Arm, Patient Position: Sitting, Cuff Size: Small)   Pulse (!) 55   Temp 97.9 F (36.6 C)   Ht 5' 6.2" (1.681 m)   Wt 157 lb (71.2 kg)   SpO2 99%   BMI 25.19 kg/m    Subjective:    Patient ID: Ivan Larsen, male    DOB: 05-May-1956, 60 y.o.   MRN: [id number]  HPI: Ivan Larsen is a 60 y.o. male  Chief Complaint  Patient presents with  . Annual Exam  . Diabetes  Patient all in all doing well with no real complaints medical issues all stable no chest pain chest tightness no cardiovascular issues PAD is stable legs still bother him some with a lot of exertion but otherwise doing well Diabetes maybe got off his diet a little bit has liberalized causing his A1c to go up which is been good the last 2 times. No low blood sugar spells Blood pressure good control with no side effects Cholesterol good control with no side effects  Relevant past medical, surgical, family and social history reviewed and updated as indicated. Interim medical history since our last visit reviewed. Allergies and medications reviewed and updated.  Review of Systems  Constitutional: Negative.   HENT: Negative.   Eyes: Negative.   Respiratory: Negative.   Cardiovascular: Negative.   Gastrointestinal: Negative.   Endocrine: Negative.   Genitourinary: Negative.   Musculoskeletal: Negative.   Skin: Negative.   Allergic/Immunologic: Negative.   Neurological: Negative.   Hematological: Negative.   Psychiatric/Behavioral: Negative.     Per HPI unless specifically indicated above     Objective:    BP 116/63 (BP Location: Left Arm, Patient Position: Sitting, Cuff Size: Small)   Pulse (!) 55   Temp 97.9 F (36.6 C)   Ht 5' 6.2" (1.681 m)   Wt 157 lb (71.2 kg)   SpO2 99%   BMI 25.19 kg/m   Wt Readings from Last 3 Encounters:  12/22/15 157 lb (71.2 kg)  09/15/15 159 lb (72.1 kg)  06/16/15 162 lb (73.5 kg)    Physical Exam  Constitutional: He is oriented to  person, place, and time. He appears well-developed and well-nourished.  HENT:  Head: Normocephalic and atraumatic.  Right Ear: External ear normal.  Left Ear: External ear normal.  Eyes: Conjunctivae and EOM are normal. Pupils are equal, round, and reactive to light.  Neck: Normal range of motion. Neck supple.  Cardiovascular: Normal rate, regular rhythm, normal heart sounds and intact distal pulses.   Pulmonary/Chest: Effort normal and breath sounds normal.  Abdominal: Soft. Bowel sounds are normal. There is no splenomegaly or hepatomegaly.  Genitourinary: Rectum normal, prostate normal and penis normal.  Musculoskeletal: Normal range of motion.  Pulses in feet diminished  Neurological: He is alert and oriented to person, place, and time. He has normal reflexes.  Skin: No rash noted. No erythema.  Psychiatric: He has a normal mood and affect. His behavior is normal. Judgment and thought content normal.    Results for orders placed or performed in visit on 12/22/15  Hemoglobin A1c  Result Value Ref Range   Hemoglobin A1C 7.3       Assessment & Plan:   Problem List Items Addressed This Visit      Cardiovascular and Mediastinum   Hypertension    The current medical regimen is effective;  continue present plan and medications.       Relevant Medications   metoprolol (LOPRESSOR) 50  MG tablet   hydrochlorothiazide (HYDRODIURIL) 25 MG tablet   benazepril (LOTENSIN) 40 MG tablet   atorvastatin (LIPITOR) 40 MG tablet   amLODipine (NORVASC) 10 MG tablet   CAD (coronary artery disease)    The current medical regimen is effective;  continue present plan and medications.       Relevant Medications   metoprolol (LOPRESSOR) 50 MG tablet   hydrochlorothiazide (HYDRODIURIL) 25 MG tablet   clopidogrel (PLAVIX) 75 MG tablet   benazepril (LOTENSIN) 40 MG tablet   atorvastatin (LIPITOR) 40 MG tablet   amLODipine (NORVASC) 10 MG tablet   PAD (peripheral artery disease) (HCC)    The  current medical regimen is effective;  continue present plan and medications.       Relevant Medications   metoprolol (LOPRESSOR) 50 MG tablet   hydrochlorothiazide (HYDRODIURIL) 25 MG tablet   benazepril (LOTENSIN) 40 MG tablet   atorvastatin (LIPITOR) 40 MG tablet   amLODipine (NORVASC) 10 MG tablet     Endocrine   Diabetes mellitus without complication (HCC)    Increased hemoglobin A1c patient will become more cognizant of diet and exercise and do better      Relevant Medications   saxagliptin HCl (ONGLYZA) 5 MG TABS tablet   metFORMIN (GLUCOPHAGE) 500 MG tablet   dapagliflozin propanediol (FARXIGA) 10 MG TABS tablet   benazepril (LOTENSIN) 40 MG tablet   atorvastatin (LIPITOR) 40 MG tablet   Other Relevant Orders   Bayer DCA Hb A1c Waived     Other   Hyperlipidemia    The current medical regimen is effective;  continue present plan and medications.       Relevant Medications   metoprolol (LOPRESSOR) 50 MG tablet   hydrochlorothiazide (HYDRODIURIL) 25 MG tablet   benazepril (LOTENSIN) 40 MG tablet   atorvastatin (LIPITOR) 40 MG tablet   amLODipine (NORVASC) 10 MG tablet    Other Visit Diagnoses    Annual physical exam    -  Primary   Relevant Orders   Urinalysis, Routine w reflex microscopic (not at [facility])   CBC with Differential/Platelet   Comprehensive metabolic panel   Lipid Panel w/o Chol/HDL Ratio   PSA   TSH       Follow up plan: Return in about 3 months (around 03/23/2016) for Hemoglobin A1c.

## 2015-12-23 ENCOUNTER — Encounter: Payer: Self-pay | Admitting: Family Medicine

## 2015-12-23 LAB — CBC WITH DIFFERENTIAL/PLATELET
BASOS ABS: 0 10*3/uL (ref 0.0–0.2)
BASOS: 0 %
EOS (ABSOLUTE): 0.2 10*3/uL (ref 0.0–0.4)
Eos: 2 %
Hematocrit: 44.5 % (ref 37.5–51.0)
Hemoglobin: 14.5 g/dL (ref 12.6–17.7)
Immature Grans (Abs): 0 10*3/uL (ref 0.0–0.1)
Immature Granulocytes: 0 %
LYMPHS: 18 %
Lymphocytes Absolute: 1.8 10*3/uL (ref 0.7–3.1)
MCH: 28.9 pg (ref 26.6–33.0)
MCHC: 32.6 g/dL (ref 31.5–35.7)
MCV: 89 fL (ref 79–97)
MONOS ABS: 1.6 10*3/uL — AB (ref 0.1–0.9)
Monocytes: 16 %
NEUTROS ABS: 6.5 10*3/uL (ref 1.4–7.0)
Neutrophils: 64 %
PLATELETS: 232 10*3/uL (ref 150–379)
RBC: 5.02 x10E6/uL (ref 4.14–5.80)
RDW: 13.9 % (ref 12.3–15.4)
WBC: 10.2 10*3/uL (ref 3.4–10.8)

## 2015-12-23 LAB — COMPREHENSIVE METABOLIC PANEL
A/G RATIO: 2.1 (ref 1.2–2.2)
ALK PHOS: 62 IU/L (ref 39–117)
ALT: 38 IU/L (ref 0–44)
AST: 26 IU/L (ref 0–40)
Albumin: 4.7 g/dL (ref 3.6–4.8)
BILIRUBIN TOTAL: 0.4 mg/dL (ref 0.0–1.2)
BUN/Creatinine Ratio: 19 (ref 10–24)
BUN: 17 mg/dL (ref 8–27)
CHLORIDE: 97 mmol/L (ref 96–106)
CO2: 23 mmol/L (ref 18–29)
Calcium: 9.8 mg/dL (ref 8.6–10.2)
Creatinine, Ser: 0.91 mg/dL (ref 0.76–1.27)
GFR calc non Af Amer: 91 mL/min/{1.73_m2} (ref >59)
GFR, EST AFRICAN AMERICAN: 105 mL/min/{1.73_m2} (ref >59)
GLUCOSE: 162 mg/dL — AB (ref 65–99)
Globulin, Total: 2.2 g/dL (ref 1.5–4.5)
POTASSIUM: 4.8 mmol/L (ref 3.5–5.2)
Sodium: 140 mmol/L (ref 134–144)
Total Protein: 6.9 g/dL (ref 6.0–8.5)

## 2015-12-23 LAB — LIPID PANEL W/O CHOL/HDL RATIO
CHOLESTEROL TOTAL: 93 mg/dL — AB (ref 100–199)
HDL: 30 mg/dL — AB (ref >39)
LDL Calculated: 27 mg/dL (ref 0–99)
TRIGLYCERIDES: 181 mg/dL — AB (ref 0–149)
VLDL Cholesterol Cal: 36 mg/dL (ref 5–40)

## 2015-12-23 LAB — PSA: Prostate Specific Ag, Serum: 0.9 ng/mL (ref 0.0–4.0)

## 2015-12-23 LAB — TSH: TSH: 1.28 u[IU]/mL (ref 0.450–4.500)

## 2016-03-24 ENCOUNTER — Ambulatory Visit: Payer: BLUE CROSS/BLUE SHIELD | Admitting: Family Medicine

## 2016-10-11 DIAGNOSIS — I77811 Abdominal aortic ectasia: Secondary | ICD-10-CM | POA: Insufficient documentation

## 2016-11-03 ENCOUNTER — Encounter (INDEPENDENT_AMBULATORY_CARE_PROVIDER_SITE_OTHER): Payer: Self-pay | Admitting: Vascular Surgery

## 2016-11-15 ENCOUNTER — Encounter: Payer: Self-pay | Admitting: Family Medicine

## 2016-11-15 ENCOUNTER — Ambulatory Visit (INDEPENDENT_AMBULATORY_CARE_PROVIDER_SITE_OTHER): Payer: BLUE CROSS/BLUE SHIELD | Admitting: Family Medicine

## 2016-11-15 VITALS — BP 113/67 | HR 64 | Wt 159.0 lb

## 2016-11-15 DIAGNOSIS — E119 Type 2 diabetes mellitus without complications: Secondary | ICD-10-CM

## 2016-11-15 DIAGNOSIS — I2510 Atherosclerotic heart disease of native coronary artery without angina pectoris: Secondary | ICD-10-CM | POA: Diagnosis not present

## 2016-11-15 DIAGNOSIS — I10 Essential (primary) hypertension: Secondary | ICD-10-CM | POA: Diagnosis not present

## 2016-11-15 DIAGNOSIS — I2583 Coronary atherosclerosis due to lipid rich plaque: Secondary | ICD-10-CM | POA: Diagnosis not present

## 2016-11-15 LAB — BAYER DCA HB A1C WAIVED: HB A1C: 6.6 % (ref <7.0)

## 2016-11-15 NOTE — Assessment & Plan Note (Signed)
The current medical regimen is effective;  continue present plan and medications.  

## 2016-11-15 NOTE — Progress Notes (Signed)
BP 113/67   Pulse 64   Wt 159 lb (72.1 kg)   SpO2 95%   BMI 25.51 kg/m    Subjective:    Patient ID: Ivan Larsen, male    DOB: 1956/01/11, 61 y.o.   MRN: [id number]  HPI: Ivan Larsen is a 61 y.o. male  Chief Complaint  Patient presents with  . Follow-up  Patient had good follow-up with cardiology this winter with full workup including stress test al went well. Patient does have some vascular concerns and issues has follow-up scheduled with [facility] for PAD.    Relevant past medical, surgical, family and social history reviewed and updated as indicated. Interim medical history since our last visit reviewed. Allergies and medications reviewed and updated.  Review of Systems  Constitutional: Negative.   Respiratory: Negative.   Cardiovascular: Negative.     Per HPI unless specifically indicated above     Objective:    BP 113/67   Pulse 64   Wt 159 lb (72.1 kg)   SpO2 95%   BMI 25.51 kg/m   Wt Readings from Last 3 Encounters:  11/15/16 159 lb (72.1 kg)  12/22/15 157 lb (71.2 kg)  09/15/15 159 lb (72.1 kg)    Physical Exam  Constitutional: He is oriented to person, place, and time. He appears well-developed and well-nourished.  HENT:  Head: Normocephalic and atraumatic.  Eyes: Conjunctivae and EOM are normal.  Neck: Normal range of motion.  Cardiovascular: Normal rate, regular rhythm and normal heart sounds.   Pulmonary/Chest: Effort normal and breath sounds normal.  Musculoskeletal: Normal range of motion.  Neurological: He is alert and oriented to person, place, and time.  Skin: No erythema.  Psychiatric: He has a normal mood and affect. His behavior is normal. Judgment and thought content normal.    Results for orders placed or performed in visit on 12/22/15  Bayer DCA Hb A1c Waived  Result Value Ref Range   Bayer DCA Hb A1c Waived 7.3 (H) <7.0 %  CBC with Differential/Platelet  Result Value Ref Range   WBC 10.2 3.4 -  10.8 x10E3/uL   RBC 5.02 4.14 - 5.80 x10E6/uL   Hemoglobin 14.5 12.6 - 17.7 g/dL   Hematocrit 91.444.5 78.237.5 - 51.0 %   MCV 89 79 - 97 fL   MCH 28.9 26.6 - 33.0 pg   MCHC 32.6 31.5 - 35.7 g/dL   RDW 95.613.9 21.312.3 - 08.615.4 %   Platelets 232 150 - 379 x10E3/uL   Neutrophils 64 %   Lymphs 18 %   Monocytes 16 %   Eos 2 %   Basos 0 %   Neutrophils Absolute 6.5 1.4 - 7.0 x10E3/uL   Lymphocytes Absolute 1.8 0.7 - 3.1 x10E3/uL   Monocytes Absolute 1.6 (H) 0.1 - 0.9 x10E3/uL   EOS (ABSOLUTE) 0.2 0.0 - 0.4 x10E3/uL   Basophils Absolute 0.0 0.0 - 0.2 x10E3/uL   Immature Granulocytes 0 %   Immature Grans (Abs) 0.0 0.0 - 0.1 x10E3/uL  Comprehensive metabolic panel  Result Value Ref Range   Glucose 162 (H) 65 - 99 mg/dL   BUN 17 8 - 27 mg/dL   Creatinine, Ser 5.780.91 0.76 - 1.27 mg/dL   GFR calc non Af Amer 91 >59 mL/min/1.73   GFR calc Af Amer 105 >59 mL/min/1.73   BUN/Creatinine Ratio 19 10 - 24   Sodium 140 134 - 144 mmol/L   Potassium 4.8 3.5 - 5.2 mmol/L   Chloride 97 96 -  106 mmol/L   CO2 23 18 - 29 mmol/L   Calcium 9.8 8.6 - 10.2 mg/dL   Total Protein 6.9 6.0 - 8.5 g/dL   Albumin 4.7 3.6 - 4.8 g/dL   Globulin, Total 2.2 1.5 - 4.5 g/dL   Albumin/Globulin Ratio 2.1 1.2 - 2.2   Bilirubin Total 0.4 0.0 - 1.2 mg/dL   Alkaline Phosphatase 62 39 - 117 IU/L   AST 26 0 - 40 IU/L   ALT 38 0 - 44 IU/L  Lipid Panel w/o Chol/HDL Ratio  Result Value Ref Range   Cholesterol, Total 93 (L) 100 - 199 mg/dL   Triglycerides 161 (H) 0 - 149 mg/dL   HDL 30 (L) >09 mg/dL   VLDL Cholesterol Cal 36 5 - 40 mg/dL   LDL Calculated 27 0 - 99 mg/dL  PSA  Result Value Ref Range   Prostate Specific Ag, Serum 0.9 0.0 - 4.0 ng/mL  TSH  Result Value Ref Range   TSH 1.280 0.450 - 4.500 uIU/mL  Hemoglobin A1c  Result Value Ref Range   Hemoglobin A1C 7.3       Assessment & Plan:   Problem List Items Addressed This Visit      Cardiovascular and Mediastinum   Hypertension    The current medical regimen is  effective;  continue present plan and medications.       Relevant Orders   Bayer DCA Hb A1c Waived   CAD (coronary artery disease)    The current medical regimen is effective;  continue present plan and medications.         Endocrine   Diabetes mellitus without complication (HCC) - Primary    The current medical regimen is effective;  continue present plan and medications.       Relevant Orders   Bayer DCA Hb A1c Waived       Follow up plan: Return in about 4 weeks (around 12/13/2016) for Physical Exam.

## 2016-12-27 ENCOUNTER — Ambulatory Visit (INDEPENDENT_AMBULATORY_CARE_PROVIDER_SITE_OTHER): Payer: BLUE CROSS/BLUE SHIELD | Admitting: Family Medicine

## 2016-12-27 ENCOUNTER — Encounter: Payer: Self-pay | Admitting: Family Medicine

## 2016-12-27 VITALS — BP 136/60 | HR 61 | Ht 66.54 in | Wt 159.0 lb

## 2016-12-27 DIAGNOSIS — I10 Essential (primary) hypertension: Secondary | ICD-10-CM

## 2016-12-27 DIAGNOSIS — Z0000 Encounter for general adult medical examination without abnormal findings: Secondary | ICD-10-CM | POA: Diagnosis not present

## 2016-12-27 DIAGNOSIS — E785 Hyperlipidemia, unspecified: Secondary | ICD-10-CM

## 2016-12-27 DIAGNOSIS — I2583 Coronary atherosclerosis due to lipid rich plaque: Secondary | ICD-10-CM | POA: Diagnosis not present

## 2016-12-27 DIAGNOSIS — Z1159 Encounter for screening for other viral diseases: Secondary | ICD-10-CM | POA: Diagnosis not present

## 2016-12-27 DIAGNOSIS — I739 Peripheral vascular disease, unspecified: Secondary | ICD-10-CM | POA: Diagnosis not present

## 2016-12-27 DIAGNOSIS — E119 Type 2 diabetes mellitus without complications: Secondary | ICD-10-CM | POA: Diagnosis not present

## 2016-12-27 DIAGNOSIS — Z125 Encounter for screening for malignant neoplasm of prostate: Secondary | ICD-10-CM

## 2016-12-27 DIAGNOSIS — Z114 Encounter for screening for human immunodeficiency virus [HIV]: Secondary | ICD-10-CM

## 2016-12-27 DIAGNOSIS — I2510 Atherosclerotic heart disease of native coronary artery without angina pectoris: Secondary | ICD-10-CM

## 2016-12-27 DIAGNOSIS — Z1329 Encounter for screening for other suspected endocrine disorder: Secondary | ICD-10-CM

## 2016-12-27 LAB — URINALYSIS, ROUTINE W REFLEX MICROSCOPIC
BILIRUBIN UA: NEGATIVE
Leukocytes, UA: NEGATIVE
Nitrite, UA: NEGATIVE
PH UA: 7 (ref 5.0–7.5)
SPEC GRAV UA: 1.02 (ref 1.005–1.030)
UUROB: 0.2 mg/dL (ref 0.2–1.0)

## 2016-12-27 LAB — MICROSCOPIC EXAMINATION
Bacteria, UA: NONE SEEN
WBC, UA: NONE SEEN /hpf (ref 0–?)

## 2016-12-27 MED ORDER — SAXAGLIPTIN HCL 5 MG PO TABS: ORAL_TABLET | ORAL | 12 refills | Status: DC

## 2016-12-27 MED ORDER — AMLODIPINE BESYLATE 10 MG PO TABS: 10.0000 mg | ORAL_TABLET | Freq: Every day | ORAL | 12 refills | Status: DC

## 2016-12-27 MED ORDER — BENAZEPRIL HCL 40 MG PO TABS: 40.0000 mg | ORAL_TABLET | Freq: Every day | ORAL | 12 refills | Status: DC

## 2016-12-27 MED ORDER — CLOPIDOGREL BISULFATE 75 MG PO TABS: 75.0000 mg | ORAL_TABLET | Freq: Every day | ORAL | 12 refills | Status: DC

## 2016-12-27 MED ORDER — METOPROLOL TARTRATE 50 MG PO TABS: ORAL_TABLET | ORAL | 12 refills | Status: DC

## 2016-12-27 MED ORDER — ATORVASTATIN CALCIUM 40 MG PO TABS: 40.0000 mg | ORAL_TABLET | Freq: Every day | ORAL | 12 refills | Status: DC

## 2016-12-27 MED ORDER — HYDROCHLOROTHIAZIDE 25 MG PO TABS: 25.0000 mg | ORAL_TABLET | Freq: Every day | ORAL | 12 refills | Status: DC

## 2016-12-27 MED ORDER — METFORMIN HCL 500 MG PO TABS: 1000.0000 mg | ORAL_TABLET | Freq: Two times a day (BID) | ORAL | 12 refills | Status: DC

## 2016-12-27 MED ORDER — DAPAGLIFLOZIN PROPANEDIOL 10 MG PO TABS: 10.0000 mg | ORAL_TABLET | Freq: Every day | ORAL | 12 refills | Status: DC

## 2016-12-27 NOTE — Assessment & Plan Note (Signed)
The current medical regimen is effective;  continue present plan and medications.  

## 2016-12-27 NOTE — Progress Notes (Signed)
BP 136/60 (BP Location: Left Arm)   Pulse 61   Ht 5' 6.53" (1.69 m)   Wt 159 lb (72.1 kg)   SpO2 98%   BMI 25.25 kg/m    Subjective:    Patient ID: Ivan Larsen, male    DOB: [birth year], 61 y.o.   MRN: [id number]  HPI: Ivan Larsen is a 61 y.o. male  Chief Complaint  Patient presents with  . Annual Exam  Patient all in all doing well PAD sometimes will act up when very busy at work running out [place] and doing a lot of walking. Otherwise legs doing well. Blood pressure good control no issues with medications No chest pain chest tightness or other cardiac symptoms. Diabetes does well with medications noted low blood sugar spells. Cholesterol also does well with no problems with medications takes all medications faithfully.  Relevant past medical, surgical, family and social history reviewed and updated as indicated. Interim medical history since our last visit reviewed. Allergies and medications reviewed and updated.  Review of Systems  Constitutional: Negative.   HENT: Negative.   Eyes: Negative.   Respiratory: Negative.   Cardiovascular: Negative.   Gastrointestinal: Negative.   Endocrine: Negative.   Genitourinary: Negative.   Musculoskeletal: Negative.   Skin: Negative.   Allergic/Immunologic: Negative.   Neurological: Negative.   Hematological: Negative.   Psychiatric/Behavioral: Negative.     Per HPI unless specifically indicated above     Objective:    BP 136/60 (BP Location: Left Arm)   Pulse 61   Ht 5' 6.53" (1.69 m)   Wt 159 lb (72.1 kg)   SpO2 98%   BMI 25.25 kg/m   Wt Readings from Last 3 Encounters:  12/27/16 159 lb (72.1 kg)  11/15/16 159 lb (72.1 kg)  12/22/15 157 lb (71.2 kg)    Physical Exam  Constitutional: He is oriented to person, place, and time. He appears well-developed and well-nourished.  HENT:  Head: Normocephalic and atraumatic.  Right Ear: External ear normal.  Left Ear: External ear normal.  Eyes: Pupils are  equal, round, and reactive to light. Conjunctivae and EOM are normal.  Neck: Normal range of motion. Neck supple.  Cardiovascular: Normal rate, regular rhythm, normal heart sounds and intact distal pulses.   Pulmonary/Chest: Effort normal and breath sounds normal.  Abdominal: Soft. Bowel sounds are normal. There is no splenomegaly or hepatomegaly.  Genitourinary: Rectum normal, prostate normal and penis normal.  Musculoskeletal: Normal range of motion.  Neurological: He is alert and oriented to person, place, and time. He has normal reflexes.  Skin: No rash noted. No erythema.  Psychiatric: He has a normal mood and affect. His behavior is normal. Judgment and thought content normal.    Results for orders placed or performed in visit on 11/15/16  Bayer DCA Hb A1c Waived  Result Value Ref Range   Bayer DCA Hb A1c Waived 6.6 <7.0 %      Assessment & Plan:   Problem List Items Addressed This Visit      Cardiovascular and Mediastinum   Hypertension - Primary    The current medical regimen is effective;  continue present plan and medications.       Relevant Medications   atorvastatin (LIPITOR) 40 MG tablet   amLODipine (NORVASC) 10 MG tablet   benazepril (LOTENSIN) 40 MG tablet   hydrochlorothiazide (HYDRODIURIL) 25 MG tablet   metoprolol tartrate (LOPRESSOR) 50 MG tablet   Other Relevant Orders   Comprehensive metabolic panel  CAD (coronary artery disease)    The current medical regimen is effective;  continue present plan and medications.       Relevant Medications   atorvastatin (LIPITOR) 40 MG tablet   amLODipine (NORVASC) 10 MG tablet   benazepril (LOTENSIN) 40 MG tablet   clopidogrel (PLAVIX) 75 MG tablet   hydrochlorothiazide (HYDRODIURIL) 25 MG tablet   metoprolol tartrate (LOPRESSOR) 50 MG tablet   PAD (peripheral artery disease) (HCC)    The current medical regimen is effective;  continue present plan and medications.       Relevant Medications    atorvastatin (LIPITOR) 40 MG tablet   amLODipine (NORVASC) 10 MG tablet   benazepril (LOTENSIN) 40 MG tablet   hydrochlorothiazide (HYDRODIURIL) 25 MG tablet   metoprolol tartrate (LOPRESSOR) 50 MG tablet     Endocrine   Diabetes mellitus without complication (HCC)    The current medical regimen is effective;  continue present plan and medications.       Relevant Medications   atorvastatin (LIPITOR) 40 MG tablet   benazepril (LOTENSIN) 40 MG tablet   dapagliflozin propanediol (FARXIGA) 10 MG TABS tablet   metFORMIN (GLUCOPHAGE) 500 MG tablet   saxagliptin HCl (ONGLYZA) 5 MG TABS tablet   Other Relevant Orders   CBC with Differential/Platelet   Urinalysis, Routine w reflex microscopic     Other   Hyperlipidemia    The current medical regimen is effective;  continue present plan and medications.       Relevant Medications   atorvastatin (LIPITOR) 40 MG tablet   amLODipine (NORVASC) 10 MG tablet   benazepril (LOTENSIN) 40 MG tablet   hydrochlorothiazide (HYDRODIURIL) 25 MG tablet   metoprolol tartrate (LOPRESSOR) 50 MG tablet   Other Relevant Orders   Lipid panel    Other Visit Diagnoses    Annual physical exam       Prostate cancer screening       Relevant Orders   PSA   Thyroid disorder screen       Relevant Orders   TSH   Encounter for screening for HIV       Relevant Orders   HIV antibody (with reflex)   Need for hepatitis C screening test       Relevant Orders   Hepatitis C Antibody       Follow up plan: Return in about 3 months (around 03/29/2017) for Hemoglobin A1c.

## 2016-12-28 ENCOUNTER — Encounter: Payer: Self-pay | Admitting: Family Medicine

## 2016-12-28 LAB — COMPREHENSIVE METABOLIC PANEL
A/G RATIO: 2.2 (ref 1.2–2.2)
ALK PHOS: 44 IU/L (ref 39–117)
ALT: 42 IU/L (ref 0–44)
AST: 30 IU/L (ref 0–40)
Albumin: 4.6 g/dL (ref 3.6–4.8)
BILIRUBIN TOTAL: 0.4 mg/dL (ref 0.0–1.2)
BUN/Creatinine Ratio: 17 (ref 10–24)
BUN: 14 mg/dL (ref 8–27)
CALCIUM: 9.8 mg/dL (ref 8.6–10.2)
CHLORIDE: 100 mmol/L (ref 96–106)
CO2: 26 mmol/L (ref 20–29)
Creatinine, Ser: 0.84 mg/dL (ref 0.76–1.27)
GFR calc Af Amer: 108 mL/min/{1.73_m2} (ref >59)
GFR calc non Af Amer: 94 mL/min/{1.73_m2} (ref >59)
GLUCOSE: 127 mg/dL — AB (ref 65–99)
Globulin, Total: 2.1 g/dL (ref 1.5–4.5)
POTASSIUM: 4.6 mmol/L (ref 3.5–5.2)
Sodium: 141 mmol/L (ref 134–144)
Total Protein: 6.7 g/dL (ref 6.0–8.5)

## 2016-12-28 LAB — LIPID PANEL
CHOLESTEROL TOTAL: 83 mg/dL — AB (ref 100–199)
Chol/HDL Ratio: 2.4 ratio (ref 0.0–5.0)
HDL: 35 mg/dL — ABNORMAL LOW (ref >39)
LDL CALC: 13 mg/dL (ref 0–99)
TRIGLYCERIDES: 174 mg/dL — AB (ref 0–149)
VLDL Cholesterol Cal: 35 mg/dL (ref 5–40)

## 2016-12-28 LAB — CBC WITH DIFFERENTIAL/PLATELET
BASOS ABS: 0 10*3/uL (ref 0.0–0.2)
Basos: 0 %
EOS (ABSOLUTE): 0.2 10*3/uL (ref 0.0–0.4)
Eos: 2 %
HEMOGLOBIN: 13.8 g/dL (ref 13.0–17.7)
Hematocrit: 41.3 % (ref 37.5–51.0)
IMMATURE GRANS (ABS): 0 10*3/uL (ref 0.0–0.1)
Immature Granulocytes: 0 %
LYMPHS: 25 %
Lymphocytes Absolute: 2.4 10*3/uL (ref 0.7–3.1)
MCH: 29.6 pg (ref 26.6–33.0)
MCHC: 33.4 g/dL (ref 31.5–35.7)
MCV: 89 fL (ref 79–97)
MONOCYTES: 8 %
Monocytes Absolute: 0.8 10*3/uL (ref 0.1–0.9)
Neutrophils Absolute: 6.3 10*3/uL (ref 1.4–7.0)
Neutrophils: 65 %
PLATELETS: 216 10*3/uL (ref 150–379)
RBC: 4.66 x10E6/uL (ref 4.14–5.80)
RDW: 14.8 % (ref 12.3–15.4)
WBC: 9.6 10*3/uL (ref 3.4–10.8)

## 2016-12-28 LAB — TSH: TSH: 0.777 u[IU]/mL (ref 0.450–4.500)

## 2016-12-28 LAB — HIV ANTIBODY (ROUTINE TESTING W REFLEX): HIV SCREEN 4TH GENERATION: NONREACTIVE

## 2016-12-28 LAB — PSA: Prostate Specific Ag, Serum: 0.9 ng/mL (ref 0.0–4.0)

## 2016-12-28 LAB — HEPATITIS C ANTIBODY: Hep C Virus Ab: <0.1 s/co ratio (ref 0.0–0.9)

## 2017-03-29 ENCOUNTER — Ambulatory Visit: Payer: BLUE CROSS/BLUE SHIELD | Admitting: Family Medicine

## 2017-03-29 ENCOUNTER — Encounter: Payer: Self-pay | Admitting: Family Medicine

## 2017-03-29 VITALS — BP 137/73 | HR 67 | Wt 158.0 lb

## 2017-03-29 DIAGNOSIS — I10 Essential (primary) hypertension: Secondary | ICD-10-CM

## 2017-03-29 DIAGNOSIS — E119 Type 2 diabetes mellitus without complications: Secondary | ICD-10-CM

## 2017-03-29 DIAGNOSIS — E785 Hyperlipidemia, unspecified: Secondary | ICD-10-CM | POA: Diagnosis not present

## 2017-03-29 LAB — BAYER DCA HB A1C WAIVED: HB A1C: 6.4 % (ref <7.0)

## 2017-03-29 NOTE — Assessment & Plan Note (Signed)
The current medical regimen is effective;  continue present plan and medications.  

## 2017-03-29 NOTE — Progress Notes (Signed)
BP 137/73   Pulse 67   Wt 158 lb (71.7 kg)   SpO2 98%   BMI 25.09 kg/m    Subjective:    Patient ID: Ivan Larsen, male    DOB: 1956/03/09, 61 y.o.   MRN: [id number]  HPI: Ivan Larsen is a 61 y.o. male  Chief Complaint  Patient presents with  . Diabetes  patient follow-up medical condwell no compl low blood sugar spells or issues.Taking medications faithfully without side effects. Blood pressure good control.No myalgias or complaints from Lipitor.Anddoing well with diabetes.  Relevant past medical, surgical, family and social history reviewed and updated as indicated. Interim medical history since our last visit reviewed. Allergies and medications reviewed and updated.  Review of Systems  Constitutional: Negative.   Respiratory: Negative.   Cardiovascular: Negative.     Per HPI unless specifically indicated above     Objective:    BP 137/73   Pulse 67   Wt 158 lb (71.7 kg)   SpO2 98%   BMI 25.09 kg/m   Wt Readings from Last 3 Encounters:  03/29/17 158 lb (71.7 kg)  12/27/16 159 lb (72.1 kg)  11/15/16 159 lb (72.1 kg)    Physical Exam  Constitutional: He is oriented to person, place, and time. He appears well-developed and well-nourished.  HENT:  Head: Normocephalic and atraumatic.  Eyes: Conjunctivae and EOM are normal.  Neck: Normal range of motion.  Cardiovascular: Normal rate, regular rhythm and normal heart sounds.  Pulmonary/Chest: Effort normal and breath sounds normal.  Musculoskeletal: Normal range of motion.  Neurological: He is alert and oriented to person, place, and time.  Skin: No erythema.  Psychiatric: He has a normal mood and affect. His behavior is normal. Judgment and thought content normal.    Results for orders placed or performed in visit on 12/27/16  Microscopic Examination  Result Value Ref Range   WBC, UA None seen 0 - 5 /hpf   RBC, UA 0-2 0 - 2 /hpf   Epithelial Cells (non renal) CANCELED    Bacteria, UA None seen  None seen/Few  Comprehensive metabolic panel  Result Value Ref Range   Glucose 127 (H) 65 - 99 mg/dL   BUN 14 8 - 27 mg/dL   Creatinine, Ser 7.820.84 0.76 - 1.27 mg/dL   GFR calc non Af Amer 94 >59 mL/min/1.73   GFR calc Af Amer 108 >59 mL/min/1.73   BUN/Creatinine Ratio 17 10 - 24   Sodium 141 134 - 144 mmol/L   Potassium 4.6 3.5 - 5.2 mmol/L   Chloride 100 96 - 106 mmol/L   CO2 26 20 - 29 mmol/L   Calcium 9.8 8.6 - 10.2 mg/dL   Total Protein 6.7 6.0 - 8.5 g/dL   Albumin 4.6 3.6 - 4.8 g/dL   Globulin, Total 2.1 1.5 - 4.5 g/dL   Albumin/Globulin Ratio 2.2 1.2 - 2.2   Bilirubin Total 0.4 0.0 - 1.2 mg/dL   Alkaline Phosphatase 44 39 - 117 IU/L   AST 30 0 - 40 IU/L   ALT 42 0 - 44 IU/L  CBC with Differential/Platelet  Result Value Ref Range   WBC 9.6 3.4 - 10.8 x10E3/uL   RBC 4.66 4.14 - 5.80 x10E6/uL   Hemoglobin 13.8 13.0 - 17.7 g/dL   Hematocrit 95.641.3 21.337.5 - 51.0 %   MCV 89 79 - 97 fL   MCH 29.6 26.6 - 33.0 pg   MCHC 33.4 31.5 - 35.7 g/dL   RDW 08.614.8  12.3 - 15.4 %   Platelets 216 150 - 379 x10E3/uL   Neutrophils 65 Not Estab. %   Lymphs 25 Not Estab. %   Monocytes 8 Not Estab. %   Eos 2 Not Estab. %   Basos 0 Not Estab. %   Neutrophils Absolute 6.3 1.4 - 7.0 x10E3/uL   Lymphocytes Absolute 2.4 0.7 - 3.1 x10E3/uL   Monocytes Absolute 0.8 0.1 - 0.9 x10E3/uL   EOS (ABSOLUTE) 0.2 0.0 - 0.4 x10E3/uL   Basophils Absolute 0.0 0.0 - 0.2 x10E3/uL   Immature Granulocytes 0 Not Estab. %   Immature Grans (Abs) 0.0 0.0 - 0.1 x10E3/uL  Lipid panel  Result Value Ref Range   Cholesterol, Total 83 (L) 100 - 199 mg/dL   Triglycerides 841174 (H) 0 - 149 mg/dL   HDL 35 (L) >32>39 mg/dL   VLDL Cholesterol Cal 35 5 - 40 mg/dL   LDL Calculated 13 0 - 99 mg/dL   Chol/HDL Ratio 2.4 0.0 - 5.0 ratio  PSA  Result Value Ref Range   Prostate Specific Ag, Serum 0.9 0.0 - 4.0 ng/mL  TSH  Result Value Ref Range   TSH 0.777 0.450 - 4.500 uIU/mL  Urinalysis, Routine w reflex microscopic  Result Value  Ref Range   Specific Gravity, UA 1.020 1.005 - 1.030   pH, UA 7.0 5.0 - 7.5   Color, UA Yellow Yellow   Appearance Ur Clear Clear   Leukocytes, UA Negative Negative   Protein, UA 1+ (A) Negative/Trace   Glucose, UA 3+ (A) Negative   Ketones, UA Trace (A) Negative   RBC, UA Trace (A) Negative   Bilirubin, UA Negative Negative   Urobilinogen, Ur 0.2 0.2 - 1.0 mg/dL   Nitrite, UA Negative Negative   Microscopic Examination See below:   HIV antibody (with reflex)  Result Value Ref Range   HIV Screen 4th Generation wRfx Non Reactive Non Reactive  Hepatitis C Antibody  Result Value Ref Range   Hep C Virus Ab <0.1 0.0 - 0.9 s/co ratio      Assessment & Plan:   Problem List Items Addressed This Visit      Cardiovascular and Mediastinum   Hypertension    The current medical regimen is effective;  continue present plan and medications.         Endocrine   Diabetes mellitus without complication (HCC) - Primary    The current medical regimen is effective;  continue present plan and medications.       Relevant Orders   Bayer DCA Hb A1c Waived     Other   Hyperlipidemia    The current medical regimen is effective;  continue present plan and medications.           Follow up plan: Return in about 3 months (around 06/29/2017) for Hemoglobin A1c, BMP,  Lipids, ALT, AST.

## 2017-05-23 ENCOUNTER — Telehealth: Payer: Self-pay

## 2017-05-23 NOTE — Telephone Encounter (Signed)
Prior authorization for Marcelline DeistFarxiga initiated via [email or web address] KEY: [id number]

## 2017-05-26 NOTE — Telephone Encounter (Signed)
P.A. Approved from 05/23/17-05-15-2038.

## 2017-07-04 ENCOUNTER — Encounter: Payer: Self-pay | Admitting: Family Medicine

## 2017-07-04 ENCOUNTER — Ambulatory Visit: Payer: BLUE CROSS/BLUE SHIELD | Admitting: Family Medicine

## 2017-07-04 VITALS — BP 132/77 | HR 65 | Wt 163.0 lb

## 2017-07-04 DIAGNOSIS — E785 Hyperlipidemia, unspecified: Secondary | ICD-10-CM

## 2017-07-04 DIAGNOSIS — E119 Type 2 diabetes mellitus without complications: Secondary | ICD-10-CM | POA: Diagnosis not present

## 2017-07-04 DIAGNOSIS — I739 Peripheral vascular disease, unspecified: Secondary | ICD-10-CM

## 2017-07-04 DIAGNOSIS — I2583 Coronary atherosclerosis due to lipid rich plaque: Secondary | ICD-10-CM

## 2017-07-04 DIAGNOSIS — I10 Essential (primary) hypertension: Secondary | ICD-10-CM

## 2017-07-04 DIAGNOSIS — I2510 Atherosclerotic heart disease of native coronary artery without angina pectoris: Secondary | ICD-10-CM | POA: Diagnosis not present

## 2017-07-04 LAB — LP+ALT+AST PICCOLO, WAIVED
ALT (SGPT) Piccolo, Waived: 61 U/L — ABNORMAL HIGH (ref 10–47)
AST (SGOT) PICCOLO, WAIVED: 44 U/L — AB (ref 11–38)
CHOLESTEROL PICCOLO, WAIVED: 89 mg/dL (ref <200)
Chol/HDL Ratio Piccolo,Waive: 2.6 mg/dL
HDL Chol Piccolo, Waived: 34 mg/dL — ABNORMAL LOW (ref >59)
LDL CHOL CALC PICCOLO WAIVED: 28 mg/dL (ref <100)
Triglycerides Piccolo,Waived: 139 mg/dL (ref <150)
VLDL Chol Calc Piccolo,Waive: 28 mg/dL (ref <30)

## 2017-07-04 LAB — BAYER DCA HB A1C WAIVED: HB A1C (BAYER DCA - WAIVED): 6.5 % (ref <7.0)

## 2017-07-04 NOTE — Assessment & Plan Note (Signed)
The current medical regimen is effective;  continue present plan and medications.  

## 2017-07-04 NOTE — Progress Notes (Signed)
   BP 132/77   Pulse 65   Wt 163 lb (73.9 kg)   SpO2 97%   BMI 25.89 kg/m    Subjective:    Patient ID: Ivan Larsen, male    DOB: 12-17-1955, 62 y.o.   MRN: [id number]  HPI: Ivan Larsen is a 62 y.o. male  Chief Complaint  Patient presents with  . Follow-up  . Diabetes  Follow-up diabetes doing well no low blood sugar spells issues with medications taken faithfully without problems. Same for blood pressure cholesterol no issues good control and no side effects.  Relevant past medical, surgical, family and social history reviewed and updated as indicated. Interim medical history since our last visit reviewed. Allergies and medications reviewed and updated.  Review of Systems  Constitutional: Negative.   Respiratory: Negative.   Cardiovascular: Negative.     Per HPI unless specifically indicated above     Objective:    BP 132/77   Pulse 65   Wt 163 lb (73.9 kg)   SpO2 97%   BMI 25.89 kg/m   Wt Readings from Last 3 Encounters:  07/04/17 163 lb (73.9 kg)  03/29/17 158 lb (71.7 kg)  12/27/16 159 lb (72.1 kg)    Physical Exam  Constitutional: He is oriented to person, place, and time. He appears well-developed and well-nourished.  HENT:  Head: Normocephalic and atraumatic.  Eyes: Conjunctivae and EOM are normal.  Neck: Normal range of motion.  Cardiovascular: Normal rate, regular rhythm and normal heart sounds.  Pulmonary/Chest: Effort normal and breath sounds normal.  Musculoskeletal: Normal range of motion.  Neurological: He is alert and oriented to person, place, and time.  Skin: No erythema.  Psychiatric: He has a normal mood and affect. His behavior is normal. Judgment and thought content normal.    Results for orders placed or performed in visit on 03/29/17  Bayer DCA Hb A1c Waived  Result Value Ref Range   Bayer DCA Hb A1c Waived 6.4 <7.0 %      Assessment & Plan:   Problem List Items Addressed This Visit      Cardiovascular and  Mediastinum   Hypertension - Primary    The current medical regimen is effective;  continue present plan and medications.       Relevant Orders   Bayer DCA Hb A1c Waived   Basic metabolic panel   LP+ALT+AST Piccolo, Waived   CAD (coronary artery disease)    The current medical regimen is effective;  continue present plan and medications.       PAD (peripheral artery disease) (HCC)    The current medical regimen is effective;  continue present plan and medications.         Endocrine   Diabetes mellitus without complication (HCC)    The current medical regimen is effective;  continue present plan and medications.       Relevant Orders   Bayer DCA Hb A1c Waived   Basic metabolic panel   LP+ALT+AST Piccolo, Waived     Other   Hyperlipidemia    The current medical regimen is effective;  continue present plan and medications.       Relevant Orders   Bayer DCA Hb A1c Waived   Basic metabolic panel   LP+ALT+AST Piccolo, Waived       Follow up plan: Return in about 3 months (around 10/01/2017) for Hemoglobin A1c.

## 2017-07-05 ENCOUNTER — Encounter: Payer: Self-pay | Admitting: Family Medicine

## 2017-07-05 LAB — BASIC METABOLIC PANEL
BUN / CREAT RATIO: 22 (ref 10–24)
BUN: 22 mg/dL (ref 8–27)
CHLORIDE: 101 mmol/L (ref 96–106)
CO2: 25 mmol/L (ref 20–29)
Calcium: 9.8 mg/dL (ref 8.6–10.2)
Creatinine, Ser: 1 mg/dL (ref 0.76–1.27)
GFR calc Af Amer: 93 mL/min/{1.73_m2} (ref >59)
GFR calc non Af Amer: 80 mL/min/{1.73_m2} (ref >59)
Glucose: 124 mg/dL — ABNORMAL HIGH (ref 65–99)
POTASSIUM: 4.5 mmol/L (ref 3.5–5.2)
SODIUM: 142 mmol/L (ref 134–144)

## 2017-10-12 ENCOUNTER — Ambulatory Visit: Payer: BLUE CROSS/BLUE SHIELD | Admitting: Family Medicine

## 2017-10-30 ENCOUNTER — Encounter: Payer: Self-pay | Admitting: Family Medicine

## 2017-10-30 ENCOUNTER — Ambulatory Visit: Payer: BLUE CROSS/BLUE SHIELD | Admitting: Family Medicine

## 2017-10-30 VITALS — BP 131/73 | HR 63 | Ht 67.0 in | Wt 163.0 lb

## 2017-10-30 DIAGNOSIS — E119 Type 2 diabetes mellitus without complications: Secondary | ICD-10-CM

## 2017-10-30 DIAGNOSIS — I10 Essential (primary) hypertension: Secondary | ICD-10-CM

## 2017-10-30 DIAGNOSIS — E785 Hyperlipidemia, unspecified: Secondary | ICD-10-CM

## 2017-10-30 NOTE — Assessment & Plan Note (Signed)
The current medical regimen is effective;  continue present plan and medications.  

## 2017-10-30 NOTE — Progress Notes (Signed)
BP 131/73   Pulse 63   Ht 5\' 7"  (1.702 m)   Wt 163 lb (73.9 kg)   SpO2 98%   BMI 25.53 kg/m    Subjective:    Patient ID: Ivan Larsen, male    DOB: [birth year], 61 y.o.   MRN: [id number]  HPI: Ivan Larsen is a 62 y.o. male  Follow-up diabetes hypertension hypercholesterol. Patient with no complaints no low blood sugar spells or issues with diabetes medications. Pressure good control no issues Cholesterol good control no issues in the same with Plavix taking without bleeding bruising issues.  Relevant past medical, surgical, family and social history reviewed and updated as indicated. Interim medical history since our last visit reviewed. Allergies and medications reviewed and updated.  Review of Systems  Constitutional: Negative.   Respiratory: Negative.   Cardiovascular: Negative.     Per HPI unless specifically indicated above     Objective:    BP 131/73   Pulse 63   Ht 5\' 7"  (1.702 m)   Wt 163 lb (73.9 kg)   SpO2 98%   BMI 25.53 kg/m   Wt Readings from Last 3 Encounters:  10/30/17 163 lb (73.9 kg)  07/04/17 163 lb (73.9 kg)  03/29/17 158 lb (71.7 kg)    Physical Exam  Constitutional: He is oriented to person, place, and time. He appears well-developed and well-nourished.  HENT:  Head: Normocephalic and atraumatic.  Eyes: Conjunctivae and EOM are normal.  Neck: Normal range of motion.  Cardiovascular: Normal rate, regular rhythm and normal heart sounds.  Pulmonary/Chest: Effort normal and breath sounds normal.  Musculoskeletal: Normal range of motion.  Neurological: He is alert and oriented to person, place, and time.  Skin: No erythema.  Psychiatric: He has a normal mood and affect. His behavior is normal. Judgment and thought content normal.    Results for orders placed or performed in visit on 07/04/17  Bayer DCA Hb A1c Waived  Result Value Ref Range   HB A1C (BAYER DCA - WAIVED) 6.5 <7.0 %  Basic metabolic panel  Result Value Ref  Range   Glucose 124 (H) 65 - 99 mg/dL   BUN 22 8 - 27 mg/dL   Creatinine, Ser 8.29 0.76 - 1.27 mg/dL   GFR calc non Af Amer 80 >59 mL/min/1.73   GFR calc Af Amer 93 >59 mL/min/1.73   BUN/Creatinine Ratio 22 10 - 24   Sodium 142 134 - 144 mmol/L   Potassium 4.5 3.5 - 5.2 mmol/L   Chloride 101 96 - 106 mmol/L   CO2 25 20 - 29 mmol/L   Calcium 9.8 8.6 - 10.2 mg/dL  LP+ALT+AST Piccolo, Waived  Result Value Ref Range   ALT (SGPT) Piccolo, Waived 61 (H) 10 - 47 U/L   AST (SGOT) Piccolo, Waived 44 (H) 11 - 38 U/L   Cholesterol Piccolo, Waived 89 <200 mg/dL   HDL Chol Piccolo, Waived 34 (L) >59 mg/dL   Triglycerides Piccolo,Waived 139 <150 mg/dL   Chol/HDL Ratio Piccolo,Waive 2.6 mg/dL   LDL Chol Calc Piccolo Waived 28 <100 mg/dL   VLDL Chol Calc Piccolo,Waive 28 <30 mg/dL      Assessment & Plan:   Problem List Items Addressed This Visit      Cardiovascular and Mediastinum   Hypertension - Primary    The current medical regimen is effective;  continue present plan and medications.       Relevant Orders   Bayer DCA Hb A1c Waived  Endocrine   DM type 2 without retinopathy (HCC)    The current medical regimen is effective;  continue present plan and medications.         Other   Hyperlipidemia, mixed    The current medical regimen is effective;  continue present plan and medications.           Follow up plan: Return in about 2 months (around 12/30/2017) for Physical Exam.

## 2017-10-31 LAB — BAYER DCA HB A1C WAIVED: HB A1C (BAYER DCA - WAIVED): 6.6 % (ref <7.0)

## 2018-01-17 ENCOUNTER — Ambulatory Visit (INDEPENDENT_AMBULATORY_CARE_PROVIDER_SITE_OTHER): Payer: BLUE CROSS/BLUE SHIELD | Admitting: Family Medicine

## 2018-01-17 ENCOUNTER — Encounter: Payer: Self-pay | Admitting: Family Medicine

## 2018-01-17 VITALS — BP 137/76 | HR 89 | Ht 66.0 in | Wt 159.0 lb

## 2018-01-17 DIAGNOSIS — E119 Type 2 diabetes mellitus without complications: Secondary | ICD-10-CM

## 2018-01-17 DIAGNOSIS — Z125 Encounter for screening for malignant neoplasm of prostate: Secondary | ICD-10-CM | POA: Diagnosis not present

## 2018-01-17 DIAGNOSIS — I10 Essential (primary) hypertension: Secondary | ICD-10-CM

## 2018-01-17 DIAGNOSIS — I2510 Atherosclerotic heart disease of native coronary artery without angina pectoris: Secondary | ICD-10-CM

## 2018-01-17 DIAGNOSIS — I159 Secondary hypertension, unspecified: Secondary | ICD-10-CM

## 2018-01-17 DIAGNOSIS — Z0000 Encounter for general adult medical examination without abnormal findings: Secondary | ICD-10-CM

## 2018-01-17 DIAGNOSIS — I2583 Coronary atherosclerosis due to lipid rich plaque: Secondary | ICD-10-CM

## 2018-01-17 DIAGNOSIS — Z1329 Encounter for screening for other suspected endocrine disorder: Secondary | ICD-10-CM

## 2018-01-17 DIAGNOSIS — E782 Mixed hyperlipidemia: Secondary | ICD-10-CM

## 2018-01-17 DIAGNOSIS — E785 Hyperlipidemia, unspecified: Secondary | ICD-10-CM

## 2018-01-17 LAB — URINALYSIS, ROUTINE W REFLEX MICROSCOPIC
BILIRUBIN UA: NEGATIVE
Ketones, UA: NEGATIVE
Leukocytes, UA: NEGATIVE
Nitrite, UA: NEGATIVE
PH UA: 5.5 (ref 5.0–7.5)
Specific Gravity, UA: 1.025 (ref 1.005–1.030)
UUROB: 0.2 mg/dL (ref 0.2–1.0)

## 2018-01-17 LAB — MICROSCOPIC EXAMINATION
Bacteria, UA: NONE SEEN
Crystal Type: NONE SEEN
Crystals: NONE SEEN
Mucus, UA: NONE SEEN
Renal Epithel, UA: NONE SEEN /hpf
Trichomonas, UA: NONE SEEN
Urinalysis Comments: NONE SEEN
WBC, UA: NONE SEEN /hpf (ref 0–5)
YEAST UA: NONE SEEN

## 2018-01-17 MED ORDER — METFORMIN HCL 500 MG PO TABS: 1000.0000 mg | ORAL_TABLET | Freq: Two times a day (BID) | ORAL | 12 refills | Status: DC

## 2018-01-17 MED ORDER — SAXAGLIPTIN HCL 5 MG PO TABS: ORAL_TABLET | ORAL | 12 refills | Status: DC

## 2018-01-17 MED ORDER — CLOPIDOGREL BISULFATE 75 MG PO TABS: 75.0000 mg | ORAL_TABLET | Freq: Every day | ORAL | 12 refills | Status: DC

## 2018-01-17 MED ORDER — BENAZEPRIL HCL 40 MG PO TABS: 40.0000 mg | ORAL_TABLET | Freq: Every day | ORAL | 12 refills | Status: DC

## 2018-01-17 MED ORDER — METOPROLOL TARTRATE 50 MG PO TABS: ORAL_TABLET | ORAL | 12 refills | Status: DC

## 2018-01-17 MED ORDER — AMLODIPINE BESYLATE 10 MG PO TABS: 10.0000 mg | ORAL_TABLET | Freq: Every day | ORAL | 12 refills | Status: DC

## 2018-01-17 MED ORDER — ATORVASTATIN CALCIUM 40 MG PO TABS: 40.0000 mg | ORAL_TABLET | Freq: Every day | ORAL | 12 refills | Status: DC

## 2018-01-17 MED ORDER — DAPAGLIFLOZIN PROPANEDIOL 10 MG PO TABS: 10.0000 mg | ORAL_TABLET | Freq: Every day | ORAL | 12 refills | Status: DC

## 2018-01-17 MED ORDER — HYDROCHLOROTHIAZIDE 25 MG PO TABS: 25.0000 mg | ORAL_TABLET | Freq: Every day | ORAL | 12 refills | Status: DC

## 2018-01-17 NOTE — Assessment & Plan Note (Signed)
The current medical regimen is effective;  continue present plan and medications.  

## 2018-01-17 NOTE — Progress Notes (Signed)
BP 137/76   Pulse 89   Ht 5\' 6"  (1.676 m)   Wt 159 lb (72.1 kg)   SpO2 98%   BMI 25.66 kg/m    Subjective:    Patient ID: Ivan Larsen, male    DOB: [birth year], 62 y.o.   MRN: [id number]  HPI: Ivan Larsen is a 62 y.o. male  Chief Complaint  Patient presents with  . Annual Exam  Patient all in all doing well no complaints from medications with good control no chest pain chest tightness blood pressure cholesterol issues. Diabetes doing well no low blood sugar spells. Takes medications without problems.  Relevant past medical, surgical, family and social history reviewed and updated as indicated. Interim medical history since our last visit reviewed. Allergies and medications reviewed and updated.  Review of Systems  Constitutional: Negative.   HENT: Negative.   Eyes: Negative.   Respiratory: Negative.   Cardiovascular: Negative.   Gastrointestinal: Negative.   Endocrine: Negative.   Genitourinary: Negative.   Musculoskeletal: Negative.   Skin: Negative.   Allergic/Immunologic: Negative.   Neurological: Negative.   Hematological: Negative.   Psychiatric/Behavioral: Negative.     Per HPI unless specifically indicated above     Objective:    BP 137/76   Pulse 89   Ht 5\' 6"  (1.676 m)   Wt 159 lb (72.1 kg)   SpO2 98%   BMI 25.66 kg/m   Wt Readings from Last 3 Encounters:  01/17/18 159 lb (72.1 kg)  10/30/17 163 lb (73.9 kg)  07/04/17 163 lb (73.9 kg)    Physical Exam  Constitutional: He is oriented to person, place, and time. He appears well-developed and well-nourished.  HENT:  Head: Normocephalic and atraumatic.  Right Ear: External ear normal.  Left Ear: External ear normal.  Eyes: Pupils are equal, round, and reactive to light. Conjunctivae and EOM are normal.  Neck: Normal range of motion. Neck supple.  Cardiovascular: Normal rate, regular rhythm, normal heart sounds and intact distal pulses.  Pulmonary/Chest: Effort normal and breath  sounds normal.  Abdominal: Soft. Bowel sounds are normal. There is no splenomegaly or hepatomegaly.  Genitourinary: Rectum normal, prostate normal and penis normal.  Musculoskeletal: Normal range of motion.  Neurological: He is alert and oriented to person, place, and time. He has normal reflexes.  Skin: No rash noted. No erythema.  Psychiatric: He has a normal mood and affect. His behavior is normal. Judgment and thought content normal.    Results for orders placed or performed in visit on 10/30/17  Bayer DCA Hb A1c Waived  Result Value Ref Range   HB A1C (BAYER DCA - WAIVED) 6.6 <7.0 %      Assessment & Plan:   Problem List Items Addressed This Visit      Cardiovascular and Mediastinum   Hypertension - Primary    The current medical regimen is effective;  continue present plan and medications.       Relevant Medications   amLODipine (NORVASC) 10 MG tablet   atorvastatin (LIPITOR) 40 MG tablet   benazepril (LOTENSIN) 40 MG tablet   hydrochlorothiazide (HYDRODIURIL) 25 MG tablet   metoprolol tartrate (LOPRESSOR) 50 MG tablet   Other Relevant Orders   CBC with Differential/Platelet   Lipid panel   Comprehensive metabolic panel   Urinalysis, Routine w reflex microscopic   CBC with Differential/Platelet   Lipid panel   Comprehensive metabolic panel   Urinalysis, Routine w reflex microscopic   Atherosclerotic heart disease of native  coronary artery without angina pectoris   Relevant Medications   amLODipine (NORVASC) 10 MG tablet   atorvastatin (LIPITOR) 40 MG tablet   benazepril (LOTENSIN) 40 MG tablet   clopidogrel (PLAVIX) 75 MG tablet   hydrochlorothiazide (HYDRODIURIL) 25 MG tablet   metoprolol tartrate (LOPRESSOR) 50 MG tablet   Other Relevant Orders   CBC with Differential/Platelet   Lipid panel   Comprehensive metabolic panel   Urinalysis, Routine w reflex microscopic   HTN (hypertension)   Relevant Medications   amLODipine (NORVASC) 10 MG tablet    atorvastatin (LIPITOR) 40 MG tablet   benazepril (LOTENSIN) 40 MG tablet   hydrochlorothiazide (HYDRODIURIL) 25 MG tablet   metoprolol tartrate (LOPRESSOR) 50 MG tablet   Other Relevant Orders   CBC with Differential/Platelet   Lipid panel   Comprehensive metabolic panel   Urinalysis, Routine w reflex microscopic   CBC with Differential/Platelet   Lipid panel   Comprehensive metabolic panel   Urinalysis, Routine w reflex microscopic     Endocrine   DM type 2 without retinopathy (HCC)    The current medical regimen is effective;  continue present plan and medications.       Relevant Medications   atorvastatin (LIPITOR) 40 MG tablet   benazepril (LOTENSIN) 40 MG tablet   dapagliflozin propanediol (FARXIGA) 10 MG TABS tablet   metFORMIN (GLUCOPHAGE) 500 MG tablet   saxagliptin HCl (ONGLYZA) 5 MG TABS tablet   Other Relevant Orders   CBC with Differential/Platelet   Lipid panel   Comprehensive metabolic panel   Urinalysis, Routine w reflex microscopic     Other   Hyperlipidemia, mixed    The current medical regimen is effective;  continue present plan and medications.       Relevant Medications   amLODipine (NORVASC) 10 MG tablet   atorvastatin (LIPITOR) 40 MG tablet   benazepril (LOTENSIN) 40 MG tablet   hydrochlorothiazide (HYDRODIURIL) 25 MG tablet   metoprolol tartrate (LOPRESSOR) 50 MG tablet   Other Relevant Orders   CBC with Differential/Platelet   Lipid panel   Comprehensive metabolic panel   Urinalysis, Routine w reflex microscopic    Other Visit Diagnoses    Thyroid disorder screen       Relevant Orders   TSH   Prostate cancer screening       Relevant Orders   PSA   Hyperlipidemia, unspecified hyperlipidemia type       Relevant Medications   amLODipine (NORVASC) 10 MG tablet   atorvastatin (LIPITOR) 40 MG tablet   benazepril (LOTENSIN) 40 MG tablet   hydrochlorothiazide (HYDRODIURIL) 25 MG tablet   metoprolol tartrate (LOPRESSOR) 50 MG tablet    Coronary artery disease due to lipid rich plaque       Relevant Medications   amLODipine (NORVASC) 10 MG tablet   atorvastatin (LIPITOR) 40 MG tablet   benazepril (LOTENSIN) 40 MG tablet   clopidogrel (PLAVIX) 75 MG tablet   hydrochlorothiazide (HYDRODIURIL) 25 MG tablet   metoprolol tartrate (LOPRESSOR) 50 MG tablet   Diabetes mellitus without complication (HCC)       Relevant Medications   atorvastatin (LIPITOR) 40 MG tablet   benazepril (LOTENSIN) 40 MG tablet   dapagliflozin propanediol (FARXIGA) 10 MG TABS tablet   metFORMIN (GLUCOPHAGE) 500 MG tablet   saxagliptin HCl (ONGLYZA) 5 MG TABS tablet   Other Relevant Orders   Bayer DCA Hb A1c Waived       Follow up plan: Return in about 6 months (around 07/18/2018) for Hemoglobin  A1c, BMP,  Lipids, ALT, AST.

## 2018-01-18 ENCOUNTER — Telehealth: Payer: Self-pay | Admitting: Family Medicine

## 2018-01-18 LAB — TSH: TSH: 1.1 u[IU]/mL (ref 0.450–4.500)

## 2018-01-18 LAB — COMPREHENSIVE METABOLIC PANEL
A/G RATIO: 2.1 (ref 1.2–2.2)
ALBUMIN: 4.9 g/dL — AB (ref 3.6–4.8)
ALT: 62 IU/L — AB (ref 0–44)
AST: 45 IU/L — ABNORMAL HIGH (ref 0–40)
Alkaline Phosphatase: 44 IU/L (ref 39–117)
BUN / CREAT RATIO: 17 (ref 10–24)
BUN: 19 mg/dL (ref 8–27)
Bilirubin Total: 0.4 mg/dL (ref 0.0–1.2)
CALCIUM: 10.1 mg/dL (ref 8.6–10.2)
CO2: 23 mmol/L (ref 20–29)
Chloride: 101 mmol/L (ref 96–106)
Creatinine, Ser: 1.11 mg/dL (ref 0.76–1.27)
GFR, EST AFRICAN AMERICAN: 81 mL/min/{1.73_m2} (ref >59)
GFR, EST NON AFRICAN AMERICAN: 70 mL/min/{1.73_m2} (ref >59)
GLUCOSE: 136 mg/dL — AB (ref 65–99)
Globulin, Total: 2.3 g/dL (ref 1.5–4.5)
Potassium: 4.5 mmol/L (ref 3.5–5.2)
Sodium: 145 mmol/L — ABNORMAL HIGH (ref 134–144)
TOTAL PROTEIN: 7.2 g/dL (ref 6.0–8.5)

## 2018-01-18 LAB — CBC WITH DIFFERENTIAL/PLATELET
Basophils Absolute: 0.1 10*3/uL (ref 0.0–0.2)
Basos: 1 %
EOS (ABSOLUTE): 0.2 10*3/uL (ref 0.0–0.4)
Eos: 2 %
HEMATOCRIT: 41.7 % (ref 37.5–51.0)
Hemoglobin: 13.9 g/dL (ref 13.0–17.7)
IMMATURE GRANULOCYTES: 1 %
Immature Grans (Abs): 0.1 10*3/uL (ref 0.0–0.1)
LYMPHS: 18 %
Lymphocytes Absolute: 1.9 10*3/uL (ref 0.7–3.1)
MCH: 29.5 pg (ref 26.6–33.0)
MCHC: 33.3 g/dL (ref 31.5–35.7)
MCV: 89 fL (ref 79–97)
MONOCYTES: 10 %
Monocytes Absolute: 1 10*3/uL — ABNORMAL HIGH (ref 0.1–0.9)
NEUTROS PCT: 68 %
Neutrophils Absolute: 7.2 10*3/uL — ABNORMAL HIGH (ref 1.4–7.0)
PLATELETS: 253 10*3/uL (ref 150–450)
RBC: 4.71 x10E6/uL (ref 4.14–5.80)
RDW: 13.2 % (ref 12.3–15.4)
WBC: 10.5 10*3/uL (ref 3.4–10.8)

## 2018-01-18 LAB — PSA: Prostate Specific Ag, Serum: 0.9 ng/mL (ref 0.0–4.0)

## 2018-01-18 LAB — LIPID PANEL
CHOL/HDL RATIO: 3 ratio (ref 0.0–5.0)
Cholesterol, Total: 105 mg/dL (ref 100–199)
HDL: 35 mg/dL — ABNORMAL LOW (ref >39)
LDL Calculated: 35 mg/dL (ref 0–99)
TRIGLYCERIDES: 177 mg/dL — AB (ref 0–149)
VLDL Cholesterol Cal: 35 mg/dL (ref 5–40)

## 2018-01-18 LAB — BAYER DCA HB A1C WAIVED: HB A1C (BAYER DCA - WAIVED): 6.6 % (ref <7.0)

## 2018-01-18 NOTE — Telephone Encounter (Signed)
Phone call Discussed with patient slight elevation of liver enzymes will leave alone recheck next office visit patient not taking extra Tylenol or alcohol.

## 2018-01-18 NOTE — Telephone Encounter (Signed)
-----   Message from Richarda Overlie, [place] sent at 01/18/2018  5:07 PM EDT ----- Patient was transferred to provider for telephone conversation.

## 2018-07-18 ENCOUNTER — Ambulatory Visit: Payer: BLUE CROSS/BLUE SHIELD | Admitting: Family Medicine

## 2018-07-18 ENCOUNTER — Encounter: Payer: Self-pay | Admitting: Family Medicine

## 2018-07-18 VITALS — BP 132/60 | HR 61 | Temp 98.3°F | Wt 154.0 lb

## 2018-07-18 DIAGNOSIS — E782 Mixed hyperlipidemia: Secondary | ICD-10-CM | POA: Diagnosis not present

## 2018-07-18 DIAGNOSIS — I2510 Atherosclerotic heart disease of native coronary artery without angina pectoris: Secondary | ICD-10-CM

## 2018-07-18 DIAGNOSIS — E119 Type 2 diabetes mellitus without complications: Secondary | ICD-10-CM | POA: Diagnosis not present

## 2018-07-18 DIAGNOSIS — I10 Essential (primary) hypertension: Secondary | ICD-10-CM

## 2018-07-18 LAB — LP+ALT+AST PICCOLO, WAIVED
ALT (SGPT) Piccolo, Waived: 47 U/L (ref 10–47)
AST (SGOT) Piccolo, Waived: 40 U/L — ABNORMAL HIGH (ref 11–38)
Chol/HDL Ratio Piccolo,Waive: 2.2 mg/dL
Cholesterol Piccolo, Waived: 82 mg/dL (ref <200)
HDL Chol Piccolo, Waived: 38 mg/dL — ABNORMAL LOW (ref >59)
LDL Chol Calc Piccolo Waived: 14 mg/dL (ref <100)
TRIGLYCERIDES PICCOLO,WAIVED: 153 mg/dL — AB (ref <150)
VLDL Chol Calc Piccolo,Waive: 31 mg/dL — ABNORMAL HIGH (ref <30)

## 2018-07-18 LAB — BAYER DCA HB A1C WAIVED: HB A1C: 6.5 % (ref <7.0)

## 2018-07-18 NOTE — Assessment & Plan Note (Signed)
The current medical regimen is effective;  continue present plan and medications.  

## 2018-07-18 NOTE — Progress Notes (Signed)
BP 132/60 (BP Location: Left Arm)   Pulse 61   Temp 98.3 F (36.8 C) (Oral)   Wt 154 lb (69.9 kg)   SpO2 97%   BMI 24.86 kg/m    Subjective:    Patient ID: Ivan Larsen, male    DOB: [birth year], 63 y.o.   MRN: [id number]  HPI: Ivan Larsen is a 63 y.o. male  Chief Complaint  Patient presents with  . Diabetes    pt states he has not had a recent eye exam   . Hyperlipidemia  . Hypertension  Gait examination follow-up all in all doing well no low blood sugar spells or problems. Blood pressure doing well taking medications no low blood pressure spells and good control of blood pressure. Given the patient's cholesterol also doing well no complaints.  Relevant past medical, surgical, family and social history reviewed and updated as indicated. Interim medical history since our last visit reviewed. Allergies and medications reviewed and updated.  Review of Systems  Constitutional: Negative.   Respiratory: Negative.   Cardiovascular: Negative.     Per HPI unless specifically indicated above     Objective:    BP 132/60 (BP Location: Left Arm)   Pulse 61   Temp 98.3 F (36.8 C) (Oral)   Wt 154 lb (69.9 kg)   SpO2 97%   BMI 24.86 kg/m   Wt Readings from Last 3 Encounters:  07/18/18 154 lb (69.9 kg)  01/17/18 159 lb (72.1 kg)  10/30/17 163 lb (73.9 kg)    Physical Exam Constitutional:      Appearance: He is well-developed.  HENT:     Head: Normocephalic and atraumatic.  Eyes:     Conjunctiva/sclera: Conjunctivae normal.  Neck:     Musculoskeletal: Normal range of motion.  Cardiovascular:     Rate and Rhythm: Normal rate and regular rhythm.     Heart sounds: Normal heart sounds.  Pulmonary:     Effort: Pulmonary effort is normal.     Breath sounds: Normal breath sounds.  Musculoskeletal: Normal range of motion.  Skin:    Findings: No erythema.  Neurological:     Mental Status: He is alert and oriented to person, place, and time.  Psychiatric:         Behavior: Behavior normal.        Thought Content: Thought content normal.        Judgment: Judgment normal.     Results for orders placed or performed in visit on 01/17/18  Microscopic Examination  Result Value Ref Range   WBC, UA None seen 0 - 5 /hpf   RBC, UA 3-10 (A) 0 - 2 /hpf   Epithelial Cells (non renal) 0-10 0 - 10 /hpf   Renal Epithel, UA None seen None seen /hpf   Casts Present (A) None seen /lpf   Cast Type Hyaline casts N/A   Crystals None seen N/A   Crystal Type None seen N/A   Mucus, UA None seen Not Estab.   Bacteria, UA None seen None seen/Few   Yeast, UA None seen None seen   Trichomonas, UA None seen None seen   Urinalysis Comments None seen   CBC with Differential/Platelet  Result Value Ref Range   WBC 10.5 3.4 - 10.8 x10E3/uL   RBC 4.71 4.14 - 5.80 x10E6/uL   Hemoglobin 13.9 13.0 - 17.7 g/dL   Hematocrit 22.4 82.5 - 51.0 %   MCV 89 79 - 97 fL   MCH 29.5  26.6 - 33.0 pg   MCHC 33.3 31.5 - 35.7 g/dL   RDW 32.2 02.5 - 42.7 %   Platelets 253 150 - 450 x10E3/uL   Neutrophils 68 Not Estab. %   Lymphs 18 Not Estab. %   Monocytes 10 Not Estab. %   Eos 2 Not Estab. %   Basos 1 Not Estab. %   Neutrophils Absolute 7.2 (H) 1.4 - 7.0 x10E3/uL   Lymphocytes Absolute 1.9 0.7 - 3.1 x10E3/uL   Monocytes Absolute 1.0 (H) 0.1 - 0.9 x10E3/uL   EOS (ABSOLUTE) 0.2 0.0 - 0.4 x10E3/uL   Basophils Absolute 0.1 0.0 - 0.2 x10E3/uL   Immature Granulocytes 1 Not Estab. %   Immature Grans (Abs) 0.1 0.0 - 0.1 x10E3/uL  Lipid panel  Result Value Ref Range   Cholesterol, Total 105 100 - 199 mg/dL   Triglycerides 062 (H) 0 - 149 mg/dL   HDL 35 (L) >37 mg/dL   VLDL Cholesterol Cal 35 5 - 40 mg/dL   LDL Calculated 35 0 - 99 mg/dL   Chol/HDL Ratio 3.0 0.0 - 5.0 ratio  Comprehensive metabolic panel  Result Value Ref Range   Glucose 136 (H) 65 - 99 mg/dL   BUN 19 8 - 27 mg/dL   Creatinine, Ser 6.28 0.76 - 1.27 mg/dL   GFR calc non Af Amer 70 >59 mL/min/1.73   GFR calc Af  Amer 81 >59 mL/min/1.73   BUN/Creatinine Ratio 17 10 - 24   Sodium 145 (H) 134 - 144 mmol/L   Potassium 4.5 3.5 - 5.2 mmol/L   Chloride 101 96 - 106 mmol/L   CO2 23 20 - 29 mmol/L   Calcium 10.1 8.6 - 10.2 mg/dL   Total Protein 7.2 6.0 - 8.5 g/dL   Albumin 4.9 (H) 3.6 - 4.8 g/dL   Globulin, Total 2.3 1.5 - 4.5 g/dL   Albumin/Globulin Ratio 2.1 1.2 - 2.2   Bilirubin Total 0.4 0.0 - 1.2 mg/dL   Alkaline Phosphatase 44 39 - 117 IU/L   AST 45 (H) 0 - 40 IU/L   ALT 62 (H) 0 - 44 IU/L  PSA  Result Value Ref Range   Prostate Specific Ag, Serum 0.9 0.0 - 4.0 ng/mL  TSH  Result Value Ref Range   TSH 1.100 0.450 - 4.500 uIU/mL  Urinalysis, Routine w reflex microscopic  Result Value Ref Range   Specific Gravity, UA 1.025 1.005 - 1.030   pH, UA 5.5 5.0 - 7.5   Color, UA Yellow Yellow   Appearance Ur Clear Clear   Leukocytes, UA Negative Negative   Protein, UA 1+ (A) Negative/Trace   Glucose, UA 3+ (A) Negative   Ketones, UA Negative Negative   RBC, UA Trace (A) Negative   Bilirubin, UA Negative Negative   Urobilinogen, Ur 0.2 0.2 - 1.0 mg/dL   Nitrite, UA Negative Negative   Microscopic Examination See below:   Bayer DCA Hb A1c Waived  Result Value Ref Range   HB A1C (BAYER DCA - WAIVED) 6.6 <7.0 %      Assessment & Plan:   Problem List Items Addressed This Visit      Cardiovascular and Mediastinum   Atherosclerotic heart disease of native coronary artery without angina pectoris    The current medical regimen is effective;  continue present plan and medications.       Benign essential hypertension    The current medical regimen is effective;  continue present plan and medications.  RESOLVED: HTN (hypertension) - Primary   Relevant Orders   Basic metabolic panel     Endocrine   DM type 2 without retinopathy (HCC)    The current medical regimen is effective;  continue present plan and medications.       Relevant Orders   Bayer DCA Hb A1c Waived     Other    Hyperlipidemia, mixed    The current medical regimen is effective;  continue present plan and medications.       Relevant Orders   LP+ALT+AST Piccolo, Waived       Follow up plan: Return in about 6 months (around 01/18/2019) for Physical Exam, Hemoglobin A1c.

## 2018-07-19 ENCOUNTER — Encounter: Payer: Self-pay | Admitting: Family Medicine

## 2018-07-19 LAB — BASIC METABOLIC PANEL
BUN / CREAT RATIO: 20 (ref 10–24)
BUN: 19 mg/dL (ref 8–27)
CALCIUM: 10 mg/dL (ref 8.6–10.2)
CHLORIDE: 103 mmol/L (ref 96–106)
CO2: 22 mmol/L (ref 20–29)
Creatinine, Ser: 0.93 mg/dL (ref 0.76–1.27)
GFR calc Af Amer: 100 mL/min/{1.73_m2} (ref >59)
GFR calc non Af Amer: 86 mL/min/{1.73_m2} (ref >59)
GLUCOSE: 127 mg/dL — AB (ref 65–99)
Potassium: 4.6 mmol/L (ref 3.5–5.2)
Sodium: 140 mmol/L (ref 134–144)

## 2018-08-09 ENCOUNTER — Emergency Department: Payer: BLUE CROSS/BLUE SHIELD

## 2018-08-09 ENCOUNTER — Other Ambulatory Visit: Payer: Self-pay

## 2018-08-09 ENCOUNTER — Emergency Department
Admission: EM | Admit: 2018-08-09 | Discharge: 2018-08-09 | Disposition: A | Discharge status: Home / Self Care | Payer: BLUE CROSS/BLUE SHIELD | Attending: Emergency Medicine | Admitting: Emergency Medicine

## 2018-08-09 DIAGNOSIS — R197 Diarrhea, unspecified: Secondary | ICD-10-CM | POA: Insufficient documentation

## 2018-08-09 DIAGNOSIS — I252 Old myocardial infarction: Secondary | ICD-10-CM | POA: Insufficient documentation

## 2018-08-09 DIAGNOSIS — Z87891 Personal history of nicotine dependence: Secondary | ICD-10-CM | POA: Insufficient documentation

## 2018-08-09 DIAGNOSIS — R112 Nausea with vomiting, unspecified: Secondary | ICD-10-CM | POA: Diagnosis not present

## 2018-08-09 DIAGNOSIS — I2510 Atherosclerotic heart disease of native coronary artery without angina pectoris: Secondary | ICD-10-CM | POA: Diagnosis not present

## 2018-08-09 DIAGNOSIS — Z7984 Long term (current) use of oral hypoglycemic drugs: Secondary | ICD-10-CM | POA: Diagnosis not present

## 2018-08-09 DIAGNOSIS — R1110 Vomiting, unspecified: Secondary | ICD-10-CM

## 2018-08-09 DIAGNOSIS — E119 Type 2 diabetes mellitus without complications: Secondary | ICD-10-CM | POA: Insufficient documentation

## 2018-08-09 DIAGNOSIS — Z79899 Other long term (current) drug therapy: Secondary | ICD-10-CM | POA: Diagnosis not present

## 2018-08-09 DIAGNOSIS — R42 Dizziness and giddiness: Secondary | ICD-10-CM

## 2018-08-09 LAB — CBC WITH DIFFERENTIAL/PLATELET
Abs Immature Granulocytes: 0.09 10*3/uL — ABNORMAL HIGH (ref 0.00–0.07)
Basophils Absolute: 0.1 10*3/uL (ref 0.0–0.1)
Basophils Relative: 1 %
EOS ABS: 0.1 10*3/uL (ref 0.0–0.5)
EOS PCT: 1 %
HCT: 42 % (ref 39.0–52.0)
Hemoglobin: 14.3 g/dL (ref 13.0–17.0)
Immature Granulocytes: 1 %
LYMPHS PCT: 19 %
Lymphs Abs: 2.6 10*3/uL (ref 0.7–4.0)
MCH: 29.2 pg (ref 26.0–34.0)
MCHC: 34 g/dL (ref 30.0–36.0)
MCV: 85.9 fL (ref 80.0–100.0)
MONO ABS: 1 10*3/uL (ref 0.1–1.0)
Monocytes Relative: 8 %
Neutro Abs: 9.5 10*3/uL — ABNORMAL HIGH (ref 1.7–7.7)
Neutrophils Relative %: 70 %
Platelets: 254 10*3/uL (ref 150–400)
RBC: 4.89 MIL/uL (ref 4.22–5.81)
RDW: 14.2 % (ref 11.5–15.5)
WBC: 13.4 10*3/uL — ABNORMAL HIGH (ref 4.0–10.5)
nRBC: 0 % (ref 0.0–0.2)

## 2018-08-09 LAB — BASIC METABOLIC PANEL
Anion gap: 17 — ABNORMAL HIGH (ref 5–15)
BUN: 25 mg/dL — ABNORMAL HIGH (ref 8–23)
CO2: 22 mmol/L (ref 22–32)
Calcium: 9.6 mg/dL (ref 8.9–10.3)
Chloride: 98 mmol/L (ref 98–111)
Creatinine, Ser: 1.07 mg/dL (ref 0.61–1.24)
GFR calc Af Amer: >60 mL/min (ref >60)
GFR calc non Af Amer: >60 mL/min (ref >60)
Glucose, Bld: 208 mg/dL — ABNORMAL HIGH (ref 70–99)
Potassium: 3.4 mmol/L — ABNORMAL LOW (ref 3.5–5.1)
Sodium: 137 mmol/L (ref 135–145)

## 2018-08-09 LAB — TROPONIN I: Troponin I: <0.03 ng/mL (ref <0.03)

## 2018-08-09 LAB — HEPATIC FUNCTION PANEL
ALT: 55 U/L — AB (ref 0–44)
AST: 38 U/L (ref 15–41)
Albumin: 4.8 g/dL (ref 3.5–5.0)
Alkaline Phosphatase: 36 U/L — ABNORMAL LOW (ref 38–126)
Bilirubin, Direct: 0.1 mg/dL (ref 0.0–0.2)
Indirect Bilirubin: 0.7 mg/dL (ref 0.3–0.9)
Total Bilirubin: 0.8 mg/dL (ref 0.3–1.2)
Total Protein: 7.5 g/dL (ref 6.5–8.1)

## 2018-08-09 LAB — LIPASE, BLOOD: Lipase: 47 U/L (ref 11–51)

## 2018-08-09 IMAGING — CT CT HEAD WITHOUT CONTRAST
3 series · 16 of 47 positions shown, 19 images · non-contrast
Comparison: None.

CLINICAL DATA: Acute onset nausea, vomiting and diarrhea today.

EXAM:
CT HEAD WITHOUT CONTRAST
TECHNIQUE: Contiguous axial images were obtained from the base of the skull
through the vertex without intravenous contrast.

[Series 2: head wo · axial · 0.43mm/px · z∈[+410,+545]mm · 10 of 33 slices shown, 13 images]
[im 3/33  brain]
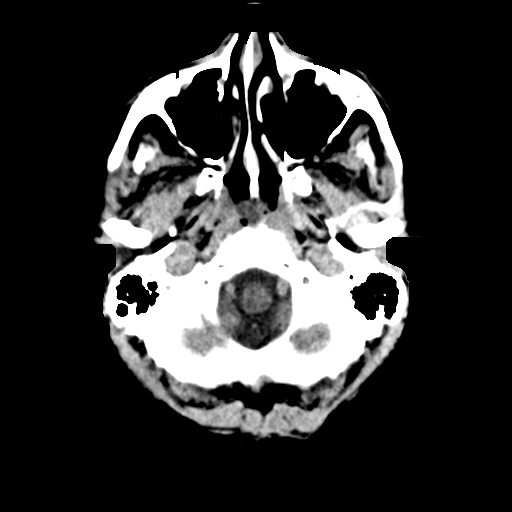
[im 3/33  bone]
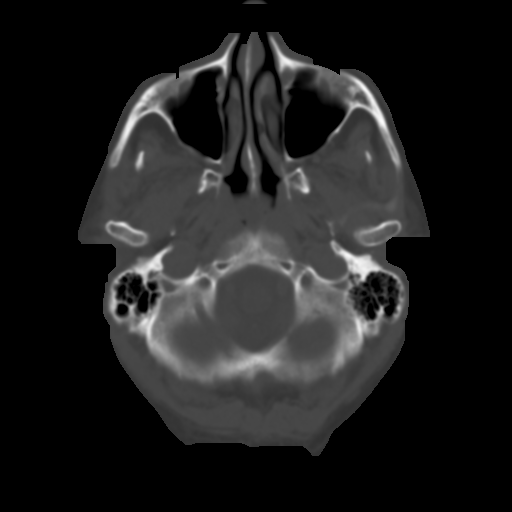
[im 6/33  brain]
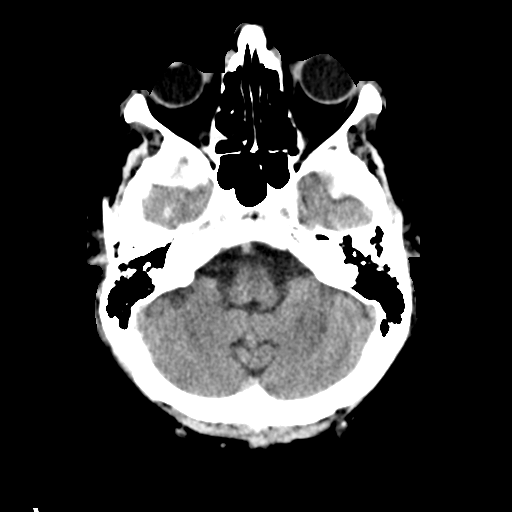
[im 9/33  brain]
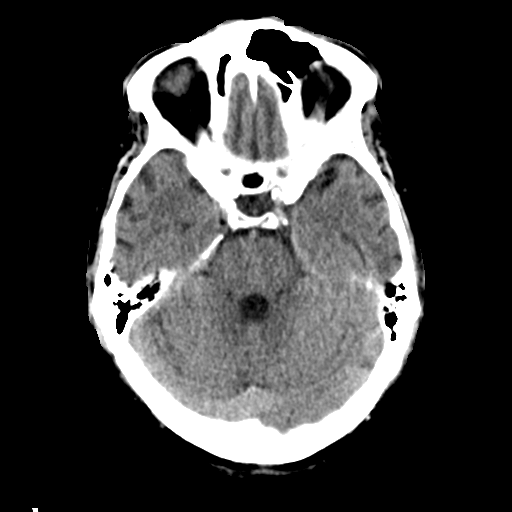
[im 12/33  brain]
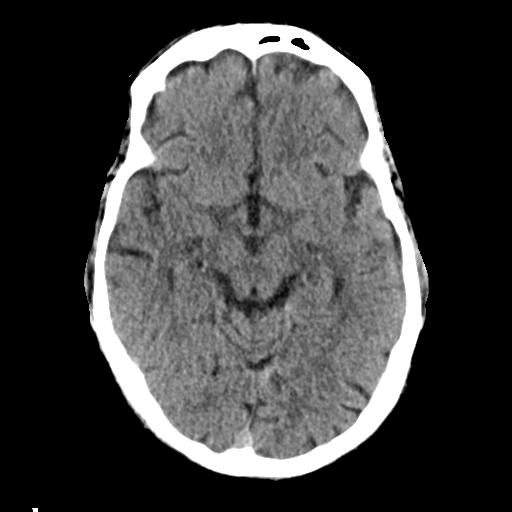
[im 15/33  brain]
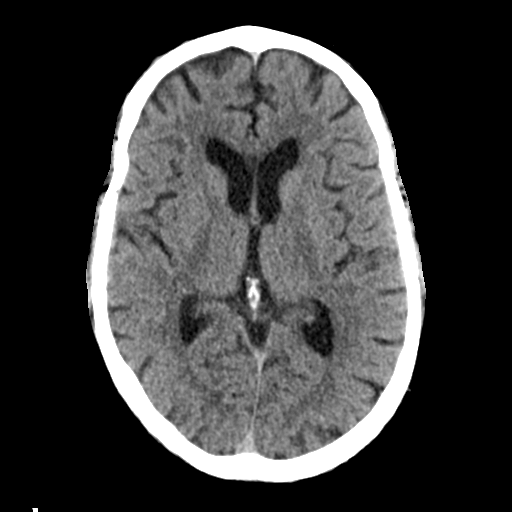
[im 15/33  bone]
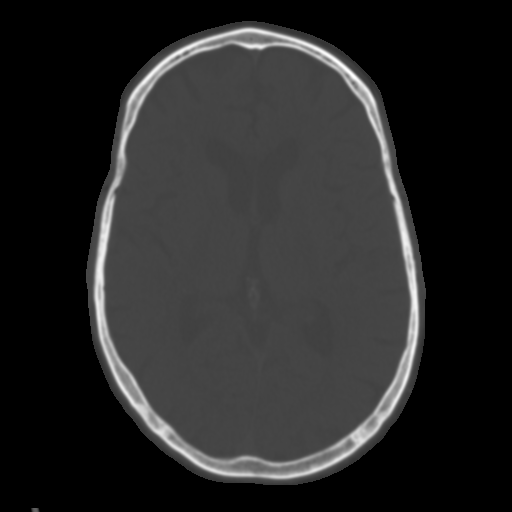
[im 18/33  brain]
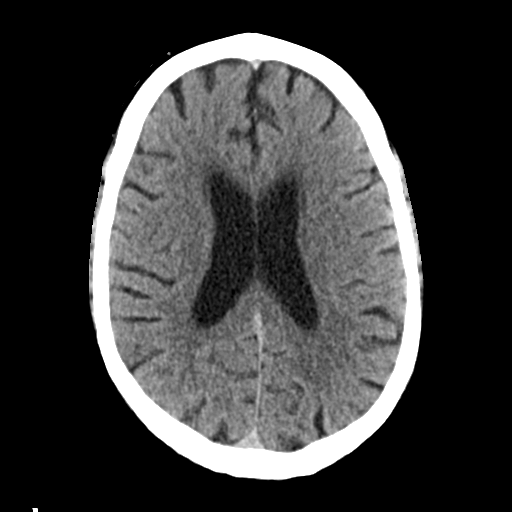
[im 21/33  brain]
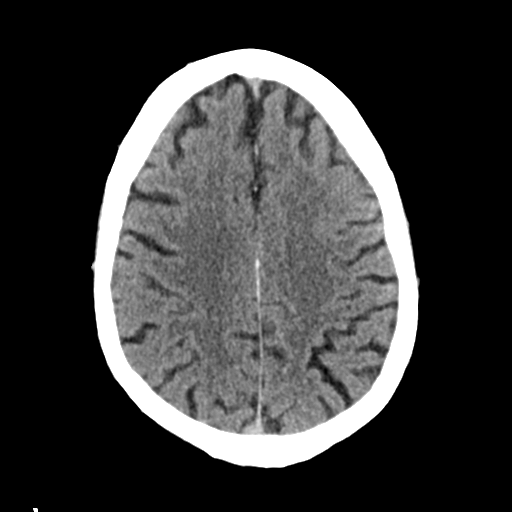
[im 25/33  brain]
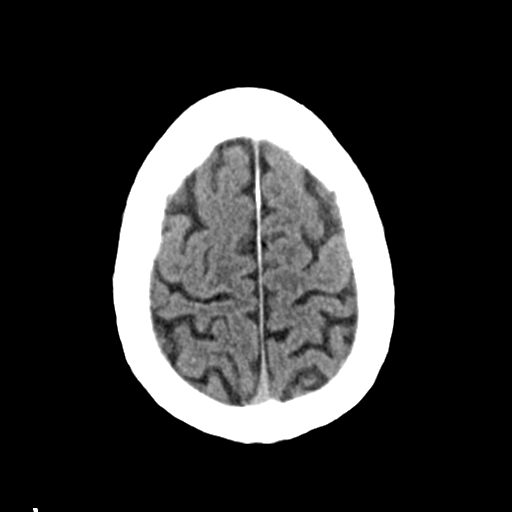
[im 27/33  brain]
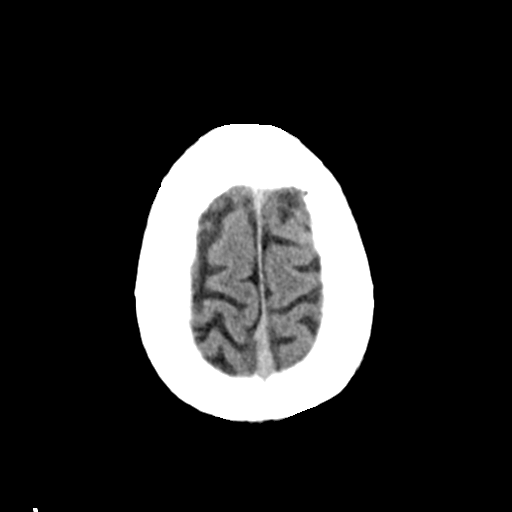
[im 27/33  bone]
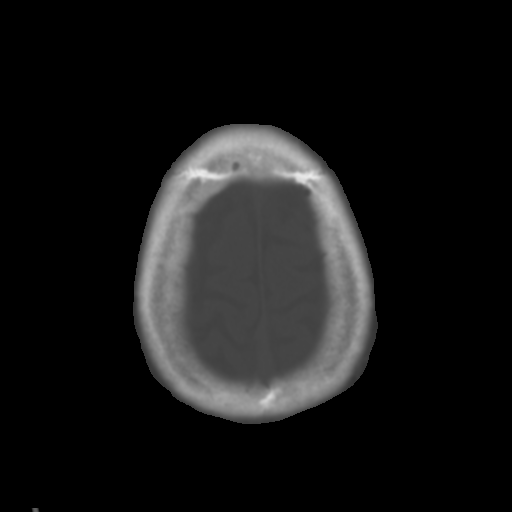
[im 30/33  brain]
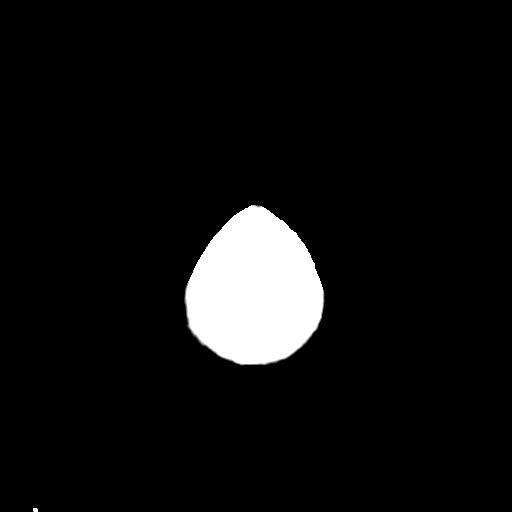

[Series 4: coronal soft tissue · coronal · 0.32mm/px · 3 of 70 slices shown]
[im 24/70  brain]
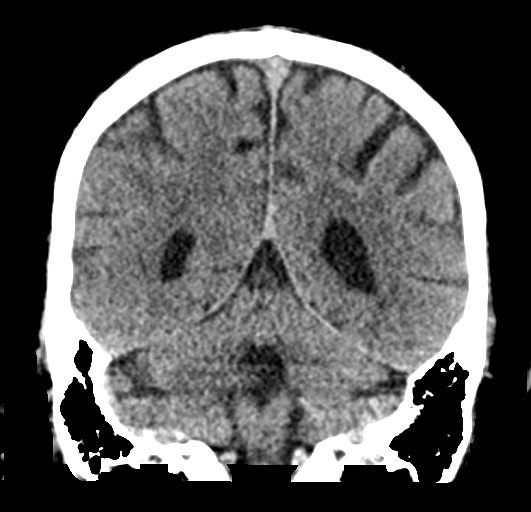
[im 31/70  brain]
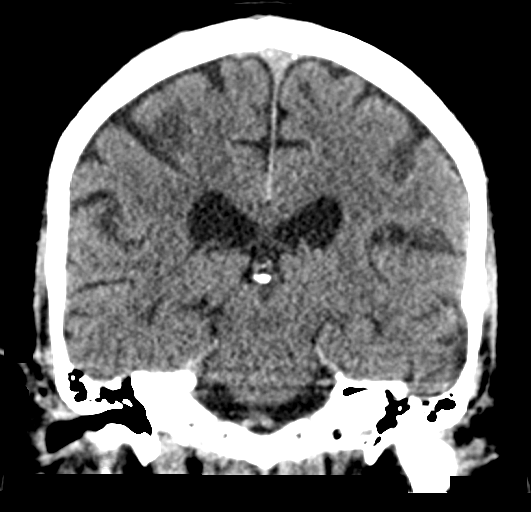
[im 39/70  brain]
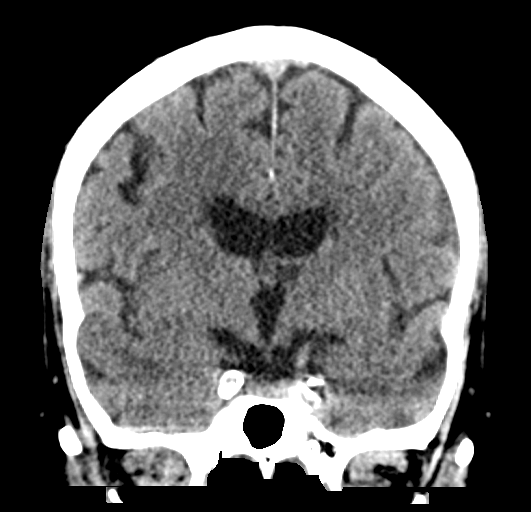

[Series 5: sagittal soft tissue · sagittal · 0.32mm/px · 3 of 55 slices shown]
[im 19/55  brain]
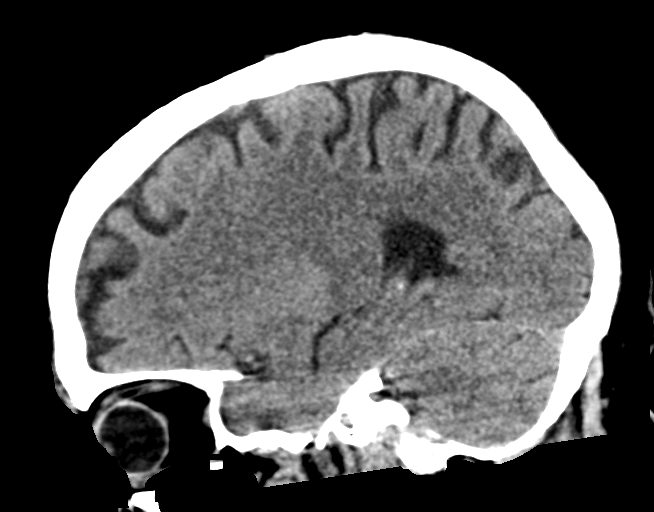
[im 28/55  brain]
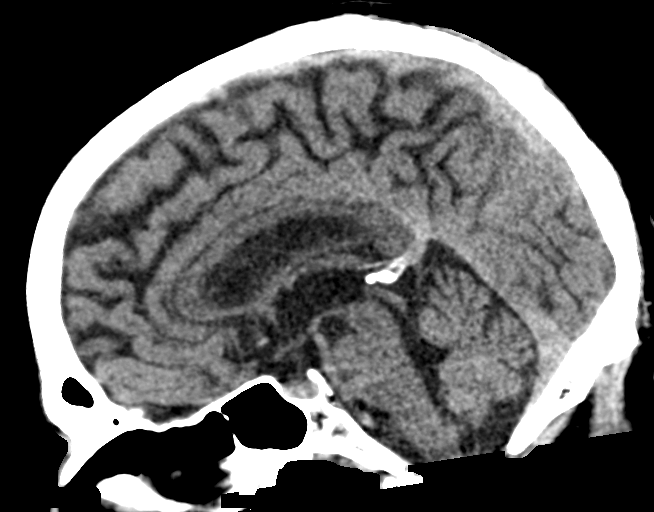
[im 37/55  brain]
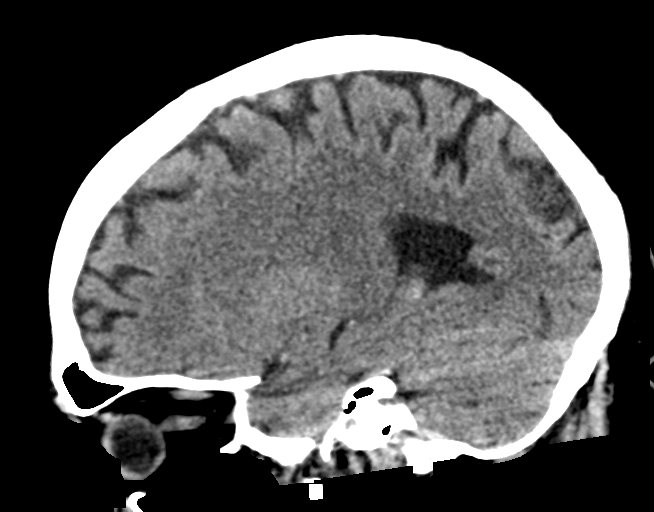

[16 of 47 positions shown; findings below may reference images not displayed]

FINDINGS: Brain: No evidence of acute infarction, hemorrhage, hydrocephalus,
extra-axial collection or mass lesion/mass effect.

Vascular: Atherosclerosis noted.

Skull: Intact.  No focal lesion.

Sinuses/Orbits: Negative.

Other: None.
IMPRESSION: No acute abnormality.

Atherosclerosis.

## 2018-08-09 IMAGING — MR MRI HEAD WITHOUT CONTRAST
9 of 10 series · 42 of 48 positions shown · non-contrast
Comparison: Head CT same day

CLINICAL DATA: Vomiting, diarrhea and dizziness beginning this
morning.

EXAM:
MRI HEAD WITHOUT CONTRAST
TECHNIQUE: Multiplanar, multiecho pulse sequences of the brain and surrounding
structures were obtained without intravenous contrast.

[Series 2: ax dwi_tracew · axial · 3.0mm · 0.68mm/px · z∈[-59,+101]mm · 6 of 55 slices shown]
[im 1/55]
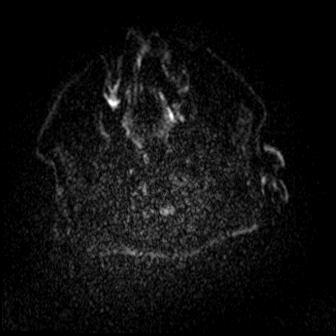
[im 11/55]
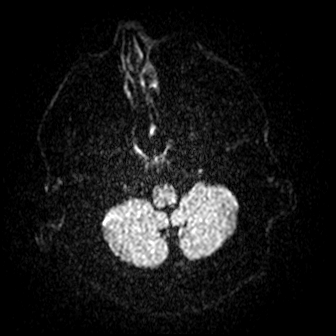
[im 22/55]
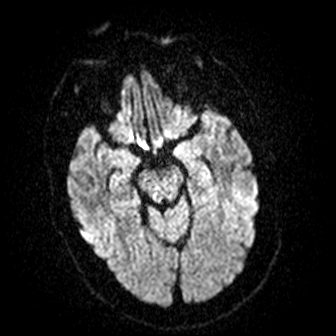
[im 33/55]
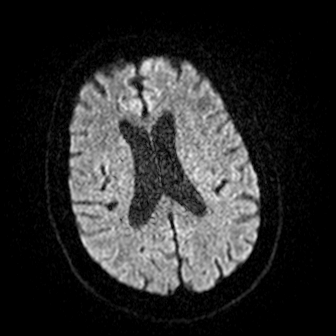
[im 44/55]
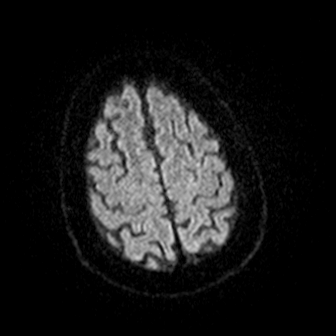
[im 55/55]
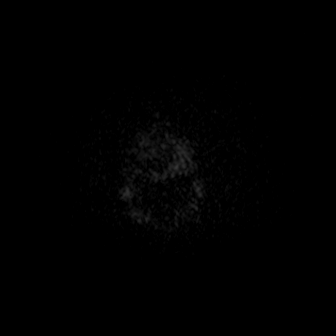

[Series 3: ax dwi_adc · axial · 3.0mm · 0.68mm/px · z∈[-59,+101]mm · 6 of 55 slices shown]
[im 1/55]
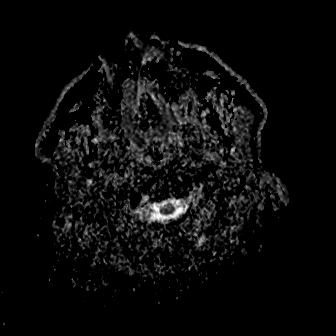
[im 11/55]
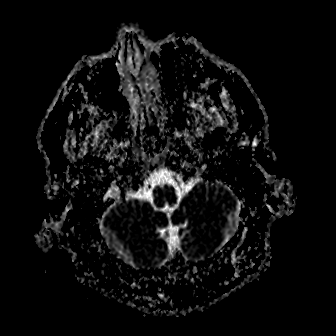
[im 22/55]
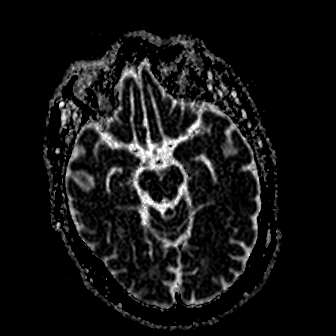
[im 33/55]
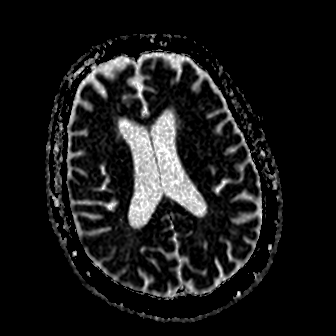
[im 44/55]
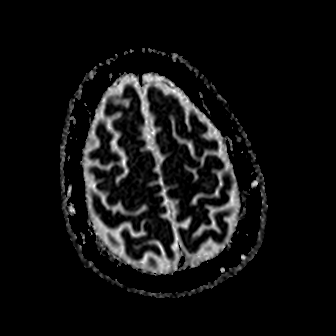
[im 55/55]
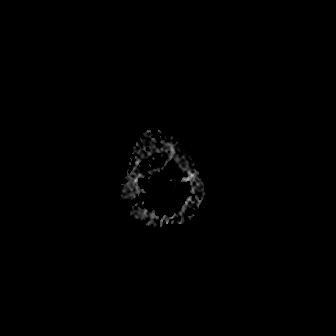

[Series 4: cor dwi_tracew · coronal · 5.0mm · 0.68mm/px · 6 of 45 slices shown]
[im 1/45]
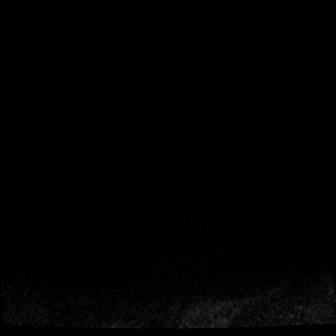
[im 9/45]
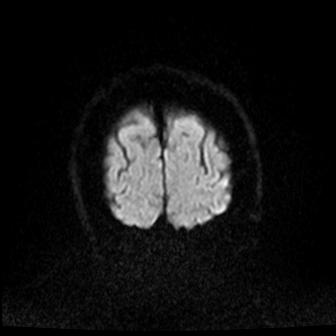
[im 18/45]
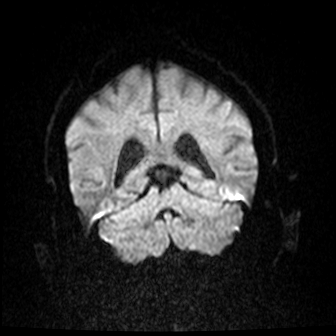
[im 27/45]
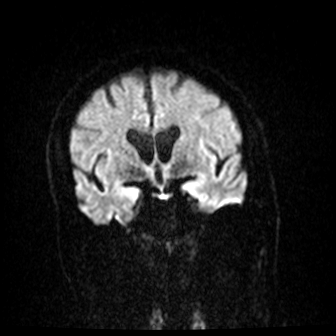
[im 36/45]
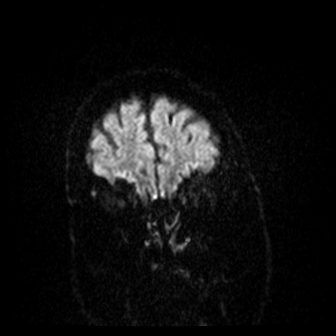
[im 45/45]
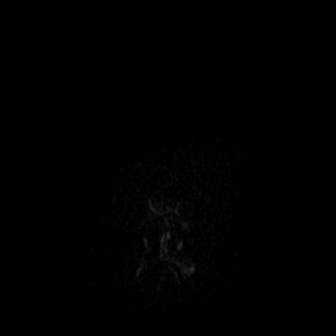

[Series 5: cor dwi_adc · coronal · 5.0mm · 0.68mm/px · 4 of 45 slices shown]
[im 1/45]
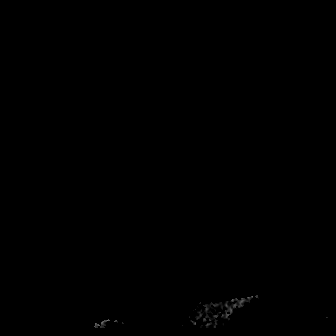
[im 9/45]
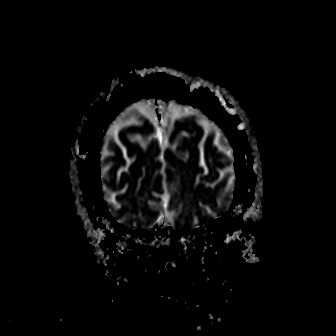
[im 18/45]
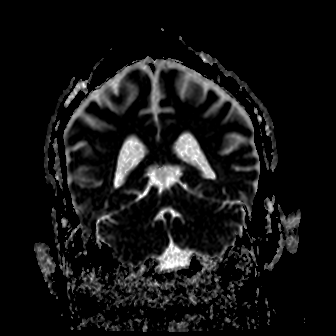
[im 27/45]
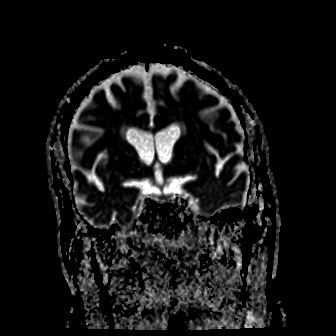

[Series 6: T2 · axial · 5.0mm · 0.45mm/px · z∈[-56,+98]mm · 4 of 27 slices shown (1 of 2)]
[im 1/27]
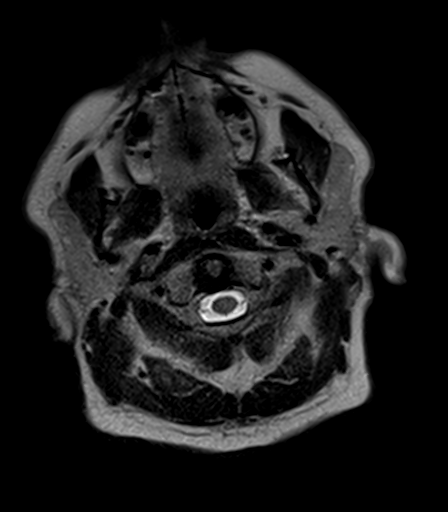
[im 9/27]
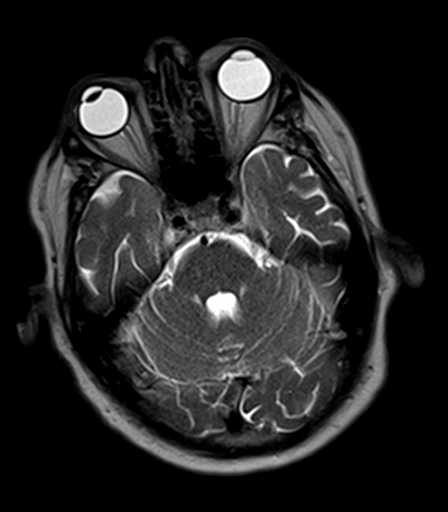
[im 18/27]
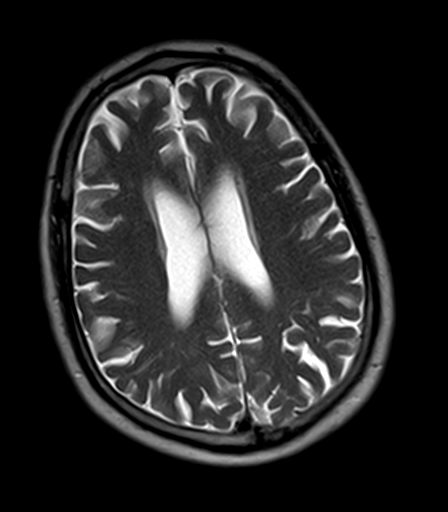
[im 27/27]
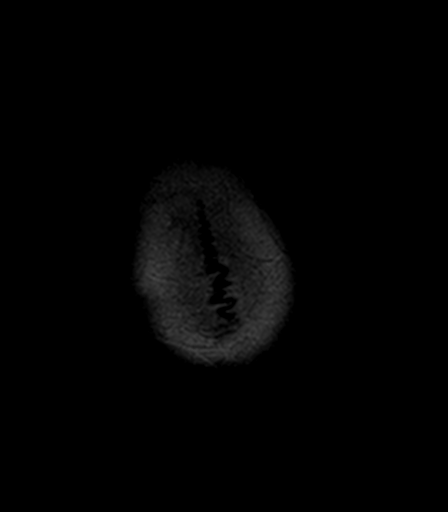

[Series 8: FLAIR · axial · 5.0mm · 1.20mm/px · z∈[-58,+96]mm · 4 of 27 slices shown]
[im 1/27]
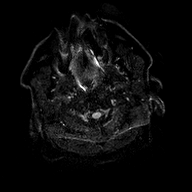
[im 9/27]
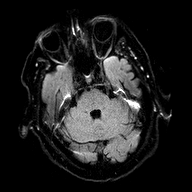
[im 18/27]
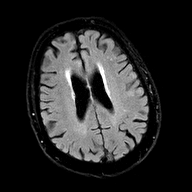
[im 27/27]
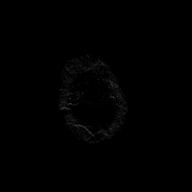

[Series 9: T1 · axial · 5.0mm · 0.90mm/px · z∈[-56,+98]mm · 4 of 27 slices shown (1 of 2)]
[im 1/27]
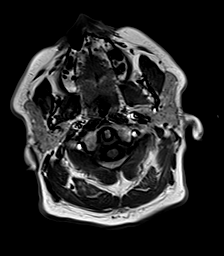
[im 9/27]
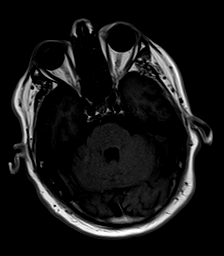
[im 18/27]
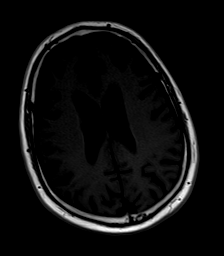
[im 27/27]
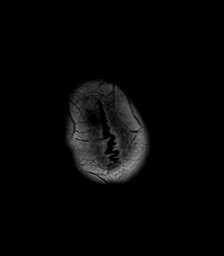

[Series 10: T1 · sagittal · 5.0mm · 0.94mm/px · 3 of 23 slices shown (2 of 2)]
[im 1/23]
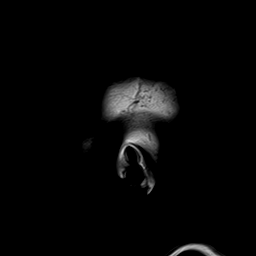
[im 12/23]
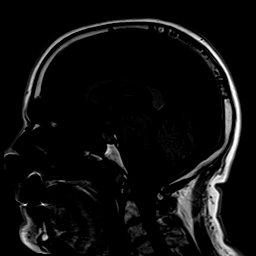
[im 23/23]
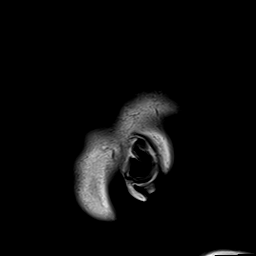

[Series 11: T2 · coronal · 5.0mm · 0.45mm/px · 5 of 35 slices shown (2 of 2)]
[im 1/35]
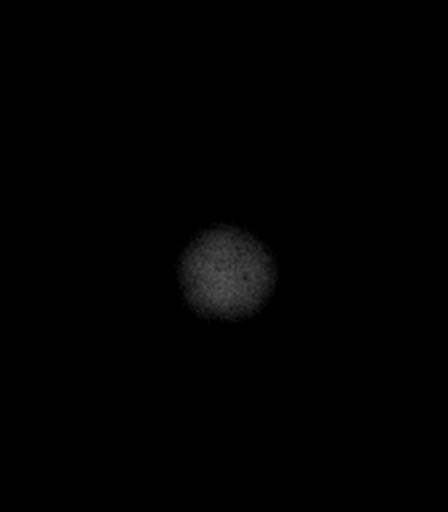
[im 9/35]
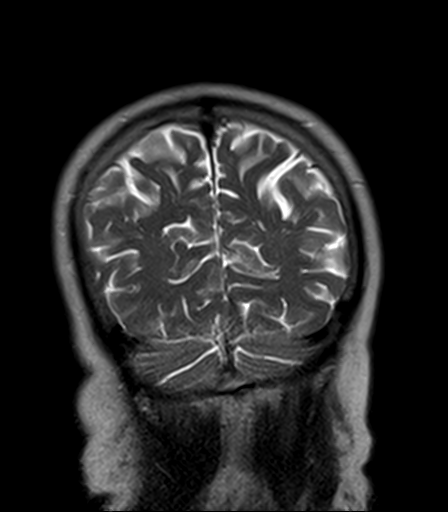
[im 18/35]
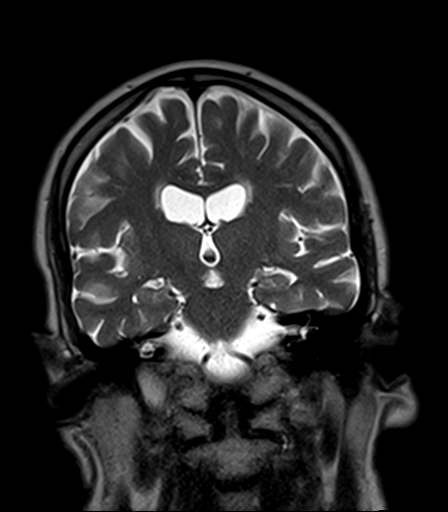
[im 26/35]
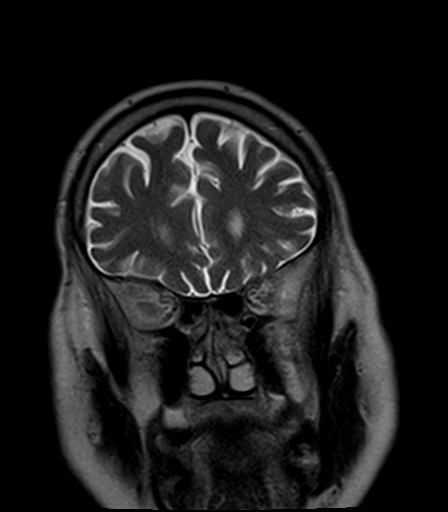
[im 35/35]
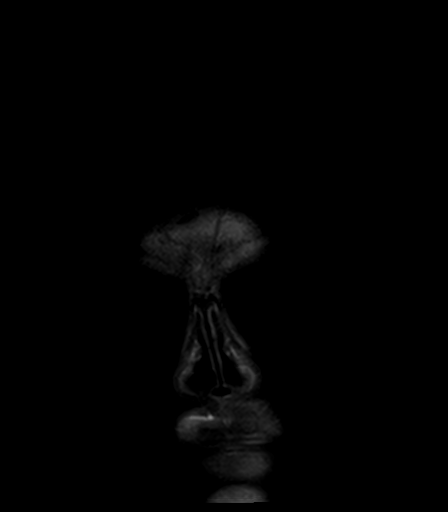

[42 of 48 positions shown; findings below may reference images not displayed]

FINDINGS: Brain: Diffusion imaging does not show any acute or subacute
infarction. The brainstem and cerebellum are normal. Cerebral
hemispheres show only minimal small vessel change of the deep white
matter. No cortical or large vessel territory infarction. No mass
lesion, hemorrhage, hydrocephalus or extra-axial collection.

Vascular: Major vessels at the base of the brain show flow.

Skull and upper cervical spine: Negative

Sinuses/Orbits: Clear/normal

Other: None
IMPRESSION: No acute finding. Minimal chronic small-vessel change of the
cerebral hemispheric white matter. No specific cause of the
presenting symptoms is identified.

## 2018-08-09 MED ORDER — METOCLOPRAMIDE HCL 5 MG/ML IJ SOLN
10.0000 mg | Freq: Once | INTRAMUSCULAR | Status: AC
  Administered 2018-08-09: 10 mg via INTRAVENOUS
  Filled 2018-08-09: qty 2

## 2018-08-09 MED ORDER — MECLIZINE HCL 25 MG PO TABS: 25.0000 mg | ORAL_TABLET | Freq: Three times a day (TID) | ORAL | 1 refills | Status: DC | PRN

## 2018-08-09 MED ORDER — MECLIZINE HCL 25 MG PO TABS
25.0000 mg | ORAL_TABLET | Freq: Once | ORAL | Status: AC
  Administered 2018-08-09: 25 mg via ORAL
  Filled 2018-08-09: qty 1

## 2018-08-09 MED ORDER — SODIUM CHLORIDE 0.9 % IV BOLUS
1000.0000 mL | Freq: Once | INTRAVENOUS | Status: AC
  Administered 2018-08-09: 1000 mL via INTRAVENOUS

## 2018-08-09 MED ORDER — ONDANSETRON HCL 4 MG/2ML IJ SOLN
4.0000 mg | Freq: Once | INTRAMUSCULAR | Status: AC
  Administered 2018-08-09: 4 mg via INTRAVENOUS
  Filled 2018-08-09: qty 2

## 2018-08-09 MED ORDER — ONDANSETRON 4 MG PO TBDP: 4.0000 mg | ORAL_TABLET | Freq: Three times a day (TID) | ORAL | 0 refills | Status: DC | PRN

## 2018-08-09 NOTE — ED Notes (Signed)
Pt helped to ambulate with this RN with no success. Pt states that he could only take a few steps before he started to get "swimmy headed" again and that he had to sit dow. EDP made aware.

## 2018-08-09 NOTE — ED Notes (Signed)
Patient transported to MRI 

## 2018-08-09 NOTE — ED Provider Notes (Signed)
[facility] Department Provider Note  ____________________________________________  Time seen: Approximately 12:59 PM  I have reviewed the triage vital signs and the nursing notes.   HISTORY  Chief Complaint Emesis and Diarrhea   HPI Ivan Larsen is a 63 y.o. male with a history of CAD, diabetes, hyperlipidemia, PAD, hypertension who presents for evaluation of vertigo and vomiting.  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 Had a normal BM and then developed room spinning sensation.  He became nauseated and had several episodes of nonbloody nonbilious emesis. He reports that dizziness is present with movement of his head but when he lays still it goes away.  He denies any personal or family history of stroke, slurred speech, facial droop, unilateral weakness or numbness, diplopia, dysarthria, dysphasia.  He denies fever or chills, chest pain or shortness of breath, cough or URI symptoms, abdominal pain  Past Medical History:  Diagnosis Date  . CAD (coronary artery disease)   . Diabetes mellitus without complication (HCC)   . Elevated liver enzymes   . History of kidney stones   . Hyperlipidemia   . MI (myocardial infarction) (HCC) 07/01/12  . PAD (peripheral artery disease) [facility])     Patient Active Problem List   Diagnosis Date Noted  . Aortic ectasia, abdominal (HCC) 10/11/2016  . Benign essential hypertension 09/29/2014  . Hyperlipidemia, mixed 04/08/2014  . Atherosclerotic heart disease of native coronary artery without angina pectoris 03/24/2013  . GERD (gastroesophageal reflux disease) 03/24/2013  . Stented coronary artery 03/24/2013  . DM type 2 without retinopathy (HCC) 01/29/2013  . Cataracts, bilateral 01/29/2013  . Cortical cataract of both eyes 01/29/2013  . PSC (posterior subcapsular cataract),  bilateral 01/29/2013    Past Surgical History:  Procedure Laterality Date  . CORONARY STENT PLACEMENT    . EYE SURGERY      Prior to Admission medications   Medication Sig Start Date End Date Taking? Authorizing Provider  amLODipine (NORVASC) 10 MG tablet Take 1 tablet (10 mg total) by mouth daily. 01/17/18   Steele Sizer, MD  atorvastatin (LIPITOR) 40 MG tablet Take 1 tablet (40 mg total) by mouth daily. 01/17/18   Steele Sizer, MD  benazepril (LOTENSIN) 40 MG tablet Take 1 tablet (40 mg total) by mouth daily. 01/17/18   Steele Sizer, MD  clopidogrel (PLAVIX) 75 MG tablet Take 1 tablet (75 mg total) by mouth daily. 01/17/18   Steele Sizer, MD  dapagliflozin propanediol (FARXIGA) 10 MG TABS tablet Take 10 mg by mouth daily. 01/17/18   Steele Sizer, MD  hydrochlorothiazide (HYDRODIURIL) 25 MG tablet Take 1 tablet (25 mg total) by mouth daily. 01/17/18   Steele Sizer, MD  meclizine (ANTIVERT) 25 MG tablet Take 1 tablet (25 mg total) by mouth 3 (three) times daily as needed for dizziness or nausea. 08/09/18   Sharman Cheek, MD  metFORMIN (GLUCOPHAGE) 500 MG tablet Take 2 tablets (1,000 mg total) by mouth 2 (two) times daily with a meal. 01/17/18   [facility]iss[facility], Redge Gainer, MD  metoprolol tartrate (LOPRESSOR) 50 MG tablet Take 1 tablet (50 mg total) by mouth 2 (two) times daily. 01/17/18   Steele Sizer, MD  Omega-3 Fatty Acids (FISH OIL OMEGA-3) 1000 MG CAPS Take by mouth 2 (two) times daily.    [provider]  ondansetron (ZOFRAN ODT) 4 MG disintegrating tablet Take 1 tablet (4 mg total) by mouth every 8 (eight) hours as needed for nausea or vomiting. 08/09/18   Sharman Cheek, MD  saxagliptin HCl (ONGLYZA) 5 MG TABS tablet Take 1 tablet (5 mg total) by mouth daily. 01/17/18   Steele Sizer, MD    Allergies Iodinated diagnostic agents; Iodine; Shellfish allergy; and Shellfish-derived products  Family History  Problem Relation Age of Onset  . Diabetes Mother   . Heart  attack Father     Social History Social History   Tobacco Use  . Smoking status: Former Smoker    Types: Cigarettes    Last attempt to quit: 01/15/2004    Years since quitting: 14.5  . Smokeless tobacco: Never Used  Substance Use Topics  . Alcohol use: Yes  . Drug use: No    Review of Systems  Constitutional: Negative for fever. Eyes: Negative for visual changes. ENT: Negative for sore throat. Neck: No neck pain  Cardiovascular: Negative for chest pain. Respiratory: Negative for shortness of breath. Gastrointestinal: Negative for abdominal pain, diarrhea. + vomiting Genitourinary: Negative for dysuria. Musculoskeletal: Negative for back pain. Skin: Negative for rash. Neurological: Negative for headaches, weakness or numbness. + vertigo Psych: No SI or HI  ____________________________________________   PHYSICAL EXAM:  VITAL SIGNS: ED Triage Vitals  Enc Vitals Group     BP 08/09/18 1222 132/61     Pulse Rate 08/09/18 1222 (!) 56     Resp 08/09/18 1222 15     Temp 08/09/18 1222 (!) 97.4 F (36.3 C)     Temp Source 08/09/18 1222 Oral     SpO2 08/09/18 1222 100 %     Weight 08/09/18 1221 150 lb (68 kg)     Height 08/09/18 1221  (1.676 m)     Head Circumference --      Peak Flow --      Pain Score 08/09/18 1221 0     Pain Loc --      Pain Edu? --      Excl. in GC? --     Constitutional: Alert and oriented. Well appearing and in no apparent distress. HEENT:      Head: Normocephalic and atraumatic.         Eyes: Conjunctivae are normal. Sclera is non-icteric.       Mouth/Throat: Mucous membranes are moist.       Neck: Supple with no signs of meningismus. Cardiovascular: Regular rate and rhythm. No murmurs, gallops, or rubs. 2+ symmetrical distal pulses are present in all extremities. No JVD. Respiratory: Normal respiratory effort. Lungs are clear to auscultation bilaterally. No wheezes, crackles, or rhonchi.  Gastrointestinal: Soft, non tender, and non  distended with positive bowel sounds. No rebound or guarding. Musculoskeletal: Nontender with normal range of motion in all extremities. No edema, cyanosis, or erythema of extremities. Neurologic: Normal speech and language. Face is symmetric.  No nystagmus, extraocular movements are intact, pupils are equal round and reactive, intact strength and sensation x4, no pronator drift or dysmetria. Skin: Skin is warm, dry and intact. No rash noted. Psychiatric: Mood and affect are normal. Speech and behavior are normal.  ____________________________________________   LABS (all labs ordered are listed, but only abnormal results are displayed)  Labs Reviewed  CBC WITH DIFFERENTIAL/PLATELET - Abnormal; Notable for the following components:      Result Value   WBC 13.4 (*)    Neutro Abs 9.5 (*)  Abs Immature Granulocytes 0.09 (*)    All other components within normal limits  BASIC METABOLIC PANEL - Abnormal; Notable for the following components:   Potassium 3.4 (*)    Glucose, Bld 208 (*)    BUN 25 (*)    Anion gap 17 (*)    All other components within normal limits  HEPATIC FUNCTION PANEL - Abnormal; Notable for the following components:   ALT 55 (*)    Alkaline Phosphatase 36 (*)    All other components within normal limits  TROPONIN I  LIPASE, BLOOD   ____________________________________________  EKG  ED ECG REPORT I, Nita Sickle, the attending physician, personally viewed and interpreted this ECG.  Normal sinus rhythm, rate of 69, normal intervals, normal axis, no ST elevations or depressions.  No significant changes when compared to prior. ____________________________________________  RADIOLOGY  I have personally reviewed the images performed during this visit and I agree with the Radiologist's read.   Interpretation by Radiologist:  Mr Brain Wo Contrast  Result Date: 08/09/2018 CLINICAL DATA:  Vomiting, diarrhea and dizziness beginning this morning. EXAM: MRI HEAD  WITHOUT CONTRAST TECHNIQUE: Multiplanar, multiecho pulse sequences of the brain and surrounding structures were obtained without intravenous contrast. COMPARISON:  Head CT same day FINDINGS: Brain: Diffusion imaging does not show any acute or subacute infarction. The brainstem and cerebellum are normal. Cerebral hemispheres show only minimal small vessel change of the deep white matter. No cortical or large vessel territory infarction. No mass lesion, hemorrhage, hydrocephalus or extra-axial collection. Vascular: Major vessels at the base of the brain show flow. Skull and upper cervical spine: Negative Sinuses/Orbits: Clear/normal Other: None IMPRESSION: No acute finding. Minimal chronic small-vessel change of the cerebral hemispheric white matter. No specific cause of the presenting symptoms is identified. Electronically Signed   By: Paulina Fusi M.D.   On: 08/09/2018 17:32      ____________________________________________   PROCEDURES  Procedure(s) performed: None Procedures Critical Care performed:  None ____________________________________________   INITIAL IMPRESSION / ASSESSMENT AND PLAN / ED COURSE  63 y.o. male with a history of CAD, diabetes, hyperlipidemia, PAD, hypertension who presents for evaluation of sudden onset of positional vertigo and vomiting.  Patient is neurologically intact, normal vital signs, EKG with no ischemic changes or dysrhythmias.  Differential diagnosis including benign paroxysmal vertigo versus labyrinthitis versus central causes of stroke.  Will give IV fluids, Zofran for nausea, check labs, CT head, meclizine and reassess.  ED COURSE:  Imaging pending. Care transferred to Dr. Scotty Court  As part of my medical decision making, I reviewed the following data within the electronic MEDICAL RECORD NUMBER Nursing notes reviewed and incorporated, Labs reviewed , EKG interpreted , Old EKG reviewed, Old chart reviewed, Radiograph reviewed , Notes from prior ED visits and [place]  Controlled Substance Database    Pertinent labs & imaging results that were available during my care of the patient were reviewed by me and considered in my medical decision making (see chart for details).    ____________________________________________   FINAL CLINICAL IMPRESSION(S) / ED DIAGNOSES  Final diagnoses:  Vomiting  Vertigo      NEW MEDICATIONS STARTED DURING THIS VISIT:  ED Discharge Orders         Ordered    ondansetron (ZOFRAN ODT) 4 MG disintegrating tablet  Every 8 hours PRN     08/09/18 1814    meclizine (ANTIVERT) 25 MG tablet  3 times daily PRN     08/09/18 1814  Note:  This document was prepared using Dragon voice recognition software and may include unintentional dictation errors.    Don Perking, [place], MD 08/10/18 [phone]

## 2018-08-09 NOTE — Discharge Instructions (Signed)
Your CT scan of the head and MRI of the brain were unremarkable and do not show any signs of bleeding or stroke.  Please follow-up with your doctor for continued monitoring of your symptoms.

## 2018-08-09 NOTE — ED Notes (Signed)
Patient transported to CT 

## 2018-08-09 NOTE — ED Provider Notes (Signed)
-----------------------------------------   6:15 PM on 08/09/2018 -----------------------------------------   Assumed care from Dr. Stan Head to follow-up on the patient's symptoms after receiving IV fluids and meclizine.  Unfortunately afterward he was still dizzy on standing, so an MRI was obtained.  This was negative for acute stroke.  No signs of intracranial hemorrhage.  At this point I think the symptoms are reliably due to vertigo/dehydration, and not due to stroke, intracranial hemorrhage, meningitis, encephalitis, vertebrobasilar insufficiency, carotid or vertebral dissection, or elevated intracranial pressure.  Doubt underlying cardiac issue.  I will start him on Zofran and meclizine, follow-up with primary care.   Sharman Cheek, MD 08/09/18 [phone]

## 2018-08-09 NOTE — ED Triage Notes (Signed)
Pt to ED via EMS. 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 PT has had diarrhea and x3-4 episodes of vomiting. PT felt fine this morning, is nauseated after 4mg  zofran enroute.

## 2019-01-16 ENCOUNTER — Ambulatory Visit (INDEPENDENT_AMBULATORY_CARE_PROVIDER_SITE_OTHER): Payer: BLUE CROSS/BLUE SHIELD | Admitting: Family Medicine

## 2019-01-16 ENCOUNTER — Encounter: Payer: Self-pay | Admitting: Family Medicine

## 2019-01-16 ENCOUNTER — Other Ambulatory Visit: Payer: Self-pay

## 2019-01-16 DIAGNOSIS — I2510 Atherosclerotic heart disease of native coronary artery without angina pectoris: Secondary | ICD-10-CM

## 2019-01-16 DIAGNOSIS — I77811 Abdominal aortic ectasia: Secondary | ICD-10-CM

## 2019-01-16 DIAGNOSIS — E1169 Type 2 diabetes mellitus with other specified complication: Secondary | ICD-10-CM

## 2019-01-16 DIAGNOSIS — I2583 Coronary atherosclerosis due to lipid rich plaque: Secondary | ICD-10-CM

## 2019-01-16 DIAGNOSIS — Z0000 Encounter for general adult medical examination without abnormal findings: Secondary | ICD-10-CM | POA: Diagnosis not present

## 2019-01-16 DIAGNOSIS — I10 Essential (primary) hypertension: Secondary | ICD-10-CM

## 2019-01-16 DIAGNOSIS — E119 Type 2 diabetes mellitus without complications: Secondary | ICD-10-CM | POA: Diagnosis not present

## 2019-01-16 DIAGNOSIS — E785 Hyperlipidemia, unspecified: Secondary | ICD-10-CM

## 2019-01-16 DIAGNOSIS — E782 Mixed hyperlipidemia: Secondary | ICD-10-CM

## 2019-01-16 MED ORDER — ATORVASTATIN CALCIUM 40 MG PO TABS: 40.0000 mg | ORAL_TABLET | Freq: Every day | ORAL | 12 refills | Status: DC

## 2019-01-16 MED ORDER — FARXIGA 10 MG PO TABS: 10.0000 mg | ORAL_TABLET | Freq: Every day | ORAL | 12 refills | Status: DC

## 2019-01-16 MED ORDER — CLOPIDOGREL BISULFATE 75 MG PO TABS: 75.0000 mg | ORAL_TABLET | Freq: Every day | ORAL | 12 refills | Status: DC

## 2019-01-16 MED ORDER — BENAZEPRIL HCL 40 MG PO TABS: 40.0000 mg | ORAL_TABLET | Freq: Every day | ORAL | 12 refills | Status: DC

## 2019-01-16 MED ORDER — AMLODIPINE BESYLATE 10 MG PO TABS: 10.0000 mg | ORAL_TABLET | Freq: Every day | ORAL | 12 refills | Status: DC

## 2019-01-16 MED ORDER — MECLIZINE HCL 25 MG PO TABS: 25.0000 mg | ORAL_TABLET | Freq: Three times a day (TID) | ORAL | 1 refills | Status: DC | PRN

## 2019-01-16 MED ORDER — ONDANSETRON 4 MG PO TBDP: 4.0000 mg | ORAL_TABLET | Freq: Three times a day (TID) | ORAL | 0 refills | Status: DC | PRN

## 2019-01-16 MED ORDER — METOPROLOL TARTRATE 50 MG PO TABS: ORAL_TABLET | ORAL | 12 refills | Status: DC

## 2019-01-16 MED ORDER — SAXAGLIPTIN HCL 5 MG PO TABS: ORAL_TABLET | ORAL | 12 refills | Status: DC

## 2019-01-16 MED ORDER — METFORMIN HCL 500 MG PO TABS: 1000.0000 mg | ORAL_TABLET | Freq: Two times a day (BID) | ORAL | 12 refills | Status: DC

## 2019-01-16 MED ORDER — HYDROCHLOROTHIAZIDE 25 MG PO TABS: 25.0000 mg | ORAL_TABLET | Freq: Every day | ORAL | 12 refills | Status: DC

## 2019-01-16 NOTE — Assessment & Plan Note (Signed)
The current medical regimen is effective;  continue present plan and medications.  

## 2019-01-16 NOTE — Progress Notes (Signed)
There were no vitals taken for this visit.   Subjective:    Patient ID: Ivan Larsen, male    DOB: 12/16/1955, 63 y.o.   MRN: [id number]  HPI: Ivan Larsen is a 63 y.o. male  Med check Discussed with patient all in all doing well no complaints taking medications without problems requesting 30-day supplies had episode of vertigo with nausea wants refills on that medicine is that helped a lot have been his medicine cabinet. No low blood sugar spells no issues with blood pressure or cholesterol cardiac status is stable and no leg issues.  Relevant past medical, surgical, family and social history reviewed and updated as indicated. Interim medical history since our last visit reviewed. Allergies and medications reviewed and updated.  Review of Systems  Constitutional: Negative.   Respiratory: Negative.   Cardiovascular: Negative.     Per HPI unless specifically indicated above     Objective:    There were no vitals taken for this visit.  Wt Readings from Last 3 Encounters:  08/09/18 150 lb (68 kg)  07/18/18 154 lb (69.9 kg)  01/17/18 159 lb (72.1 kg)    Physical Exam  Results for orders placed or performed during the hospital encounter of 08/09/18  CBC with Differential  Result Value Ref Range   WBC 13.4 (H) 4.0 - 10.5 K/uL   RBC 4.89 4.22 - 5.81 MIL/uL   Hemoglobin 14.3 13.0 - 17.0 g/dL   HCT 42.0 39.0 - 52.0 %   MCV 85.9 80.0 - 100.0 fL   MCH 29.2 26.0 - 34.0 pg   MCHC 34.0 30.0 - 36.0 g/dL   RDW 14.2 11.5 - 15.5 %   Platelets 254 150 - 400 K/uL   nRBC 0.0 0.0 - 0.2 %   Neutrophils Relative % 70 %   Neutro Abs 9.5 (H) 1.7 - 7.7 K/uL   Lymphocytes Relative 19 %   Lymphs Abs 2.6 0.7 - 4.0 K/uL   Monocytes Relative 8 %   Monocytes Absolute 1.0 0.1 - 1.0 K/uL   Eosinophils Relative 1 %   Eosinophils Absolute 0.1 0.0 - 0.5 K/uL   Basophils Relative 1 %   Basophils Absolute 0.1 0.0 - 0.1 K/uL   Immature Granulocytes 1 %   Abs Immature Granulocytes 0.09  (H) 0.00 - 0.07 K/uL  Basic metabolic panel  Result Value Ref Range   Sodium 137 135 - 145 mmol/L   Potassium 3.4 (L) 3.5 - 5.1 mmol/L   Chloride 98 98 - 111 mmol/L   CO2 22 22 - 32 mmol/L   Glucose, Bld 208 (H) 70 - 99 mg/dL   BUN 25 (H) 8 - 23 mg/dL   Creatinine, Ser 1.07 0.61 - 1.24 mg/dL   Calcium 9.6 8.9 - 10.3 mg/dL   GFR calc non Af Amer >60 >60 mL/min   GFR calc Af Amer >60 >60 mL/min   Anion gap 17 (H) 5 - 15  Troponin I - Add-On to previous collection  Result Value Ref Range   Troponin I <0.03 <0.03 ng/mL  Hepatic function panel  Result Value Ref Range   Total Protein 7.5 6.5 - 8.1 g/dL   Albumin 4.8 3.5 - 5.0 g/dL   AST 38 15 - 41 U/L   ALT 55 (H) 0 - 44 U/L   Alkaline Phosphatase 36 (L) 38 - 126 U/L   Total Bilirubin 0.8 0.3 - 1.2 mg/dL   Bilirubin, Direct 0.1 0.0 - 0.2 mg/dL   Indirect  Bilirubin 0.7 0.3 - 0.9 mg/dL  Lipase, blood  Result Value Ref Range   Lipase 47 11 - 51 U/L      Assessment & Plan:   Problem List Items Addressed This Visit      Cardiovascular and Mediastinum   Aortic ectasia, abdominal (HCC)    The current medical regimen is effective;  continue present plan and medications.       Relevant Medications   benazepril (LOTENSIN) 40 MG tablet   atorvastatin (LIPITOR) 40 MG tablet   hydrochlorothiazide (HYDRODIURIL) 25 MG tablet   amLODipine (NORVASC) 10 MG tablet   metoprolol tartrate (LOPRESSOR) 50 MG tablet   Benign essential hypertension    The current medical regimen is effective;  continue present plan and medications.       Relevant Medications   benazepril (LOTENSIN) 40 MG tablet   atorvastatin (LIPITOR) 40 MG tablet   hydrochlorothiazide (HYDRODIURIL) 25 MG tablet   amLODipine (NORVASC) 10 MG tablet   metoprolol tartrate (LOPRESSOR) 50 MG tablet     Endocrine   Diabetes mellitus associated with hormonal etiology (HCC)    The current medical regimen is effective;  continue present plan and medications.       Relevant  Medications   benazepril (LOTENSIN) 40 MG tablet   atorvastatin (LIPITOR) 40 MG tablet   dapagliflozin propanediol (FARXIGA) 10 MG TABS tablet   metFORMIN (GLUCOPHAGE) 500 MG tablet   saxagliptin HCl (ONGLYZA) 5 MG TABS tablet   Other Relevant Orders   Bayer DCA Hb A1c Waived     Other   Hyperlipidemia, mixed    The current medical regimen is effective;  continue present plan and medications.       Relevant Medications   benazepril (LOTENSIN) 40 MG tablet   atorvastatin (LIPITOR) 40 MG tablet   hydrochlorothiazide (HYDRODIURIL) 25 MG tablet   amLODipine (NORVASC) 10 MG tablet   metoprolol tartrate (LOPRESSOR) 50 MG tablet    Other Visit Diagnoses    PE (physical exam), annual    -  Primary   Relevant Orders   Comprehensive metabolic panel   Lipid panel   CBC with Differential/Platelet   TSH   Urinalysis, Routine w reflex microscopic   PSA   Essential hypertension       Relevant Medications   benazepril (LOTENSIN) 40 MG tablet   atorvastatin (LIPITOR) 40 MG tablet   hydrochlorothiazide (HYDRODIURIL) 25 MG tablet   amLODipine (NORVASC) 10 MG tablet   metoprolol tartrate (LOPRESSOR) 50 MG tablet   Hyperlipidemia, unspecified hyperlipidemia type       Relevant Medications   benazepril (LOTENSIN) 40 MG tablet   atorvastatin (LIPITOR) 40 MG tablet   hydrochlorothiazide (HYDRODIURIL) 25 MG tablet   amLODipine (NORVASC) 10 MG tablet   metoprolol tartrate (LOPRESSOR) 50 MG tablet   Diabetes mellitus without complication (HCC)       Relevant Medications   benazepril (LOTENSIN) 40 MG tablet   atorvastatin (LIPITOR) 40 MG tablet   dapagliflozin propanediol (FARXIGA) 10 MG TABS tablet   metFORMIN (GLUCOPHAGE) 500 MG tablet   saxagliptin HCl (ONGLYZA) 5 MG TABS tablet   Coronary artery disease due to lipid rich plaque       Relevant Medications   benazepril (LOTENSIN) 40 MG tablet   atorvastatin (LIPITOR) 40 MG tablet   hydrochlorothiazide (HYDRODIURIL) 25 MG tablet    amLODipine (NORVASC) 10 MG tablet   clopidogrel (PLAVIX) 75 MG tablet   metoprolol tartrate (LOPRESSOR) 50 MG tablet  Telemedicine using audio/video telecommunications for a synchronous communication visit. Today's visit due to COVID-19 isolation precautions I connected with and verified that I am speaking with the correct person using two identifiers.   I discussed the limitations, risks, security and privacy concerns of performing an evaluation and management service by telecommunication and the availability of in person appointments. I also discussed with the patient that there may be a patient responsible charge related to this service. The patient expressed understanding and agreed to proceed. The patient's location is work. I am at home.   I discussed the assessment and treatment plan with the patient. The patient was provided an opportunity to ask questions and all were answered. The patient agreed with the plan and demonstrated an understanding of the instructions.   The patient was advised to call back or seek an in-person evaluation if the symptoms worsen or if the condition fails to improve as anticipated.   I provided 21+ minutes of time during this encounter. Follow up plan: Return for Physical Exam, soon .

## 2019-01-18 ENCOUNTER — Other Ambulatory Visit: Payer: Self-pay | Admitting: Family Medicine

## 2019-01-18 MED ORDER — ONDANSETRON 4 MG PO TBDP: 4.0000 mg | ORAL_TABLET | Freq: Three times a day (TID) | ORAL | 0 refills | Status: DC | PRN

## 2019-01-18 NOTE — Telephone Encounter (Signed)
Routing to provider  

## 2019-01-18 NOTE — Telephone Encounter (Signed)
Requested medication (s) are due for refill today: yes  Requested medication (s) are on the active medication list: yes  Last refill:  01/16/2019  Future visit scheduled: no  Notes to clinic:  This refill cannot be delegated   Requested Prescriptions  Pending Prescriptions Disp Refills   ondansetron (ZOFRAN ODT) 4 MG disintegrating tablet 20 tablet 0    Sig: Take 1 tablet (4 mg total) by mouth every 8 (eight) hours as needed for nausea or vomiting.     Not Delegated - Gastroenterology: Antiemetics Failed - 01/18/2019  9:12 AM      Failed - This refill cannot be delegated      Passed - Valid encounter within last 6 months    Recent Outpatient Visits          2 days ago PE (physical exam), annual   [place], Jeannette How, MD   6 months ago Essential hypertension   [facility], Jeannette How, MD   1 year ago Essential hypertension   [facility], Jeannette How, MD   1 year ago Essential hypertension   [place], Jeannette How, MD   1 year ago Essential hypertension   [facility], Jeannette How, MD

## 2019-01-18 NOTE — Telephone Encounter (Signed)
Copied from [place] [id number]. Topic: Quick Communication - Rx Refill/Question >> Jan 18, 2019  9:09 AM Rainey Pines A wrote: Medication: ondansetron (ZOFRAN ODT) 4 MG disintegrating tablet (Patient stated that the pharmacy does not have record of medication and pharmacy needs med sent over.)  Has the patient contacted their pharmacy? {Yes (Agent: If no, request that the patient contact the pharmacy for the refill.) (Agent: If yes, when and what did the pharmacy advise?)Contact PCP  Preferred Pharmacy (with phone number or street name): [facility] [place] [phone] (Phone) [phone] (Fax)    Agent: Please be advised that RX refills may take up to 3 business days. We ask that you follow-up with your pharmacy.

## 2019-01-24 ENCOUNTER — Encounter: Payer: BLUE CROSS/BLUE SHIELD | Admitting: Family Medicine

## 2019-01-25 NOTE — Telephone Encounter (Signed)
Can this medication be called in to the pharmacy?

## 2019-01-25 NOTE — Telephone Encounter (Signed)
RX called into [facility].   Tried calling to notify patient's wife, no answer and no VM. Called and notified patient that RX was called in.

## 2019-01-25 NOTE — Telephone Encounter (Signed)
Pt wife is calling a back and the pharm did not received nausea medication. Please resend

## 2019-02-18 ENCOUNTER — Encounter: Payer: Self-pay | Admitting: Family Medicine

## 2019-02-18 ENCOUNTER — Other Ambulatory Visit: Payer: Self-pay

## 2019-02-18 ENCOUNTER — Ambulatory Visit (INDEPENDENT_AMBULATORY_CARE_PROVIDER_SITE_OTHER): Payer: BLUE CROSS/BLUE SHIELD | Admitting: Family Medicine

## 2019-02-18 VITALS — BP 130/59 | HR 61 | Temp 98.0°F | Ht 66.0 in | Wt 155.0 lb

## 2019-02-18 DIAGNOSIS — E1169 Type 2 diabetes mellitus with other specified complication: Secondary | ICD-10-CM

## 2019-02-18 DIAGNOSIS — Z0000 Encounter for general adult medical examination without abnormal findings: Secondary | ICD-10-CM

## 2019-02-18 LAB — URINALYSIS, ROUTINE W REFLEX MICROSCOPIC
Bilirubin, UA: NEGATIVE
Ketones, UA: NEGATIVE
Leukocytes,UA: NEGATIVE
Nitrite, UA: NEGATIVE
Protein,UA: NEGATIVE
RBC, UA: NEGATIVE
Specific Gravity, UA: 1.01 (ref 1.005–1.030)
Urobilinogen, Ur: 0.2 mg/dL (ref 0.2–1.0)
pH, UA: 5 (ref 5.0–7.5)

## 2019-02-18 LAB — BAYER DCA HB A1C WAIVED: HB A1C (BAYER DCA - WAIVED): 6.3 % (ref <7.0)

## 2019-02-18 NOTE — Progress Notes (Signed)
BP (!) 130/59 (BP Location: Left Arm, Patient Position: Sitting, Cuff Size: Normal)   Pulse 61   Temp 98 F (36.7 C) (Oral)   Ht  (1.676 m)   Wt 155 lb (70.3 kg)   SpO2 98%   BMI 25.02 kg/m    Subjective:    Patient ID: Ivan Larsen, male    DOB: 1956-03-01, 63 y.o.   MRN: [id number]  HPI: Ivan Larsen is a 63 y.o. male presenting on 02/18/2019 for comprehensive medical examination. Current medical complaints include:none  He currently lives with: Interim Problems from his last visit: no  Depression Screen done today and results listed below:  Depression screen [facility] 2/9 02/18/2019 01/18/2018 10/30/2017 07/04/2017 03/29/2017  Decreased Interest 0 0 0 0 0  Down, Depressed, Hopeless 0 0 0 0 0  PHQ - 2 Score 0 0 0 0 0  Altered sleeping 0 0 - - -  Tired, decreased energy 0 1 - - -  Change in appetite 0 0 - - -  Feeling bad or failure about yourself  0 0 - - -  Trouble concentrating 0 0 - - -  Moving slowly or fidgety/restless 0 0 - - -  Suicidal thoughts 0 0 - - -  PHQ-9 Score 0 1 - - -    The patient does not have a history of falls. I did complete a risk assessment for falls. A plan of care for falls was documented.   Past Medical History:  Past Medical History:  Diagnosis Date  . CAD (coronary artery disease)   . Diabetes mellitus without complication (HCC)   . Elevated liver enzymes   . History of kidney stones   . Hyperlipidemia   . MI (myocardial infarction) (HCC) 07/01/12  . PAD (peripheral artery disease) [facility])     Surgical History:  Past Surgical History:  Procedure Laterality Date  . CORONARY STENT PLACEMENT    . EYE SURGERY      Medications:  Current Outpatient Medications on File Prior to Visit  Medication Sig  . amLODipine (NORVASC) 10 MG tablet Take 1 tablet (10 mg total) by mouth daily.  Marland Kitchen atorvastatin (LIPITOR) 40 MG tablet Take 1 tablet (40 mg total) by mouth daily.  . benazepril (LOTENSIN) 40 MG tablet Take 1 tablet (40 mg total) by  mouth daily.  . clopidogrel (PLAVIX) 75 MG tablet Take 1 tablet (75 mg total) by mouth daily.  . dapagliflozin propanediol (FARXIGA) 10 MG TABS tablet Take 10 mg by mouth daily.  . hydrochlorothiazide (HYDRODIURIL) 25 MG tablet Take 1 tablet (25 mg total) by mouth daily.  . meclizine (ANTIVERT) 25 MG tablet Take 1 tablet (25 mg total) by mouth 3 (three) times daily as needed for dizziness or nausea.  . metFORMIN (GLUCOPHAGE) 500 MG tablet Take 2 tablets (1,000 mg total) by mouth 2 (two) times daily with a meal.  . metoprolol tartrate (LOPRESSOR) 50 MG tablet Take 1 tablet (50 mg total) by mouth 2 (two) times daily.  . Omega-3 Fatty Acids (FISH OIL OMEGA-3) 1000 MG CAPS Take by mouth 2 (two) times daily.  . ondansetron (ZOFRAN ODT) 4 MG disintegrating tablet Take 1 tablet (4 mg total) by mouth every 8 (eight) hours as needed for nausea or vomiting.  . saxagliptin HCl (ONGLYZA) 5 MG TABS tablet Take 1 tablet (5 mg total) by mouth daily.   No current facility-administered medications on file prior to visit.     Allergies:  Allergies  Allergen  Reactions  . Iodinated Diagnostic Agents Nausea Only    Other reaction(s): Vomiting  . Iodine   . Shellfish Allergy Nausea And Vomiting and Other (See Comments)    Sweating  Sweating    . Shellfish-Derived Products Nausea And Vomiting and Other (See Comments)    Other reaction(s): Nausea And Vomiting, Other (See Comments) Sweating  Sweating     Social History:  Social History   Socioeconomic History  . Marital status: Married    Spouse name: Not on file  . Number of children: Not on file  . Years of education: Not on file  . Highest education level: Not on file  Occupational History  . Not on file  Social Needs  . Financial resource strain: Not on file  . Food insecurity    Worry: Not on file    Inability: Not on file  . Transportation needs    Medical: Not on file    Non-medical: Not on file  Tobacco Use  . Smoking status: Former  Smoker    Types: Cigarettes    Quit date: 01/15/2004    Years since quitting: 15.1  . Smokeless tobacco: Never Used  Substance and Sexual Activity  . Alcohol use: Yes  . Drug use: No  . Sexual activity: Not on file  Lifestyle  . Physical activity    Days per week: Not on file    Minutes per session: Not on file  . Stress: Not on file  Relationships  . Social Musician on phone: Not on file    Gets together: Not on file    Attends religious service: Not on file    Active member of club or organization: Not on file    Attends meetings of clubs or organizations: Not on file    Relationship status: Not on file  . Intimate partner violence    Fear of current or ex partner: Not on file    Emotionally abused: Not on file    Physically abused: Not on file    Forced sexual activity: Not on file  Other Topics Concern  . Not on file  Social History Narrative  . Not on file   Social History   Tobacco Use  Smoking Status Former Smoker  . Types: Cigarettes  . Quit date: 01/15/2004  . Years since quitting: 15.1  Smokeless Tobacco Never Used   Social History   Substance and Sexual Activity  Alcohol Use Yes    Family History:  Family History  Problem Relation Age of Onset  . Diabetes Mother   . Heart attack Father     Past medical history, surgical history, medications, allergies, family history and social history reviewed with patient today and changes made to appropriate areas of the chart.   Review of Systems - General ROS: negative Psychological ROS: negative Ophthalmic ROS: negative ENT ROS: negative Allergy and Immunology ROS: negative Hematological and Lymphatic ROS: negative Endocrine ROS: negative Respiratory ROS: no cough, shortness of breath, or wheezing Cardiovascular ROS: no chest pain or dyspnea on exertion Gastrointestinal ROS: no abdominal pain, change in bowel habits, or black or bloody stools Genito-Urinary ROS: no dysuria, trouble voiding, or  hematuria Musculoskeletal ROS: negative Neurological ROS: no TIA or stroke symptoms Dermatological ROS: negative All other ROS negative except what is listed above and in the HPI.      Objective:    BP (!) 130/59 (BP Location: Left Arm, Patient Position: Sitting, Cuff Size: Normal)   Pulse  61   Temp 98 F (36.7 C) (Oral)   Ht 5\' 6"  (1.676 m)   Wt 155 lb (70.3 kg)   SpO2 98%   BMI 25.02 kg/m   Wt Readings from Last 3 Encounters:  02/18/19 155 lb (70.3 kg)  08/09/18 150 lb (68 kg)  07/18/18 154 lb (69.9 kg)    Physical Exam Vitals signs and nursing note reviewed.  Constitutional:      General: He is not in acute distress.    Appearance: He is well-developed.  HENT:     Head: Atraumatic.     Right Ear: Tympanic membrane and external ear normal.     Left Ear: Tympanic membrane and external ear normal.     Nose: Nose normal.     Mouth/Throat:     Mouth: Mucous membranes are moist.     Pharynx: Oropharynx is clear.  Eyes:     General: No scleral icterus.    Conjunctiva/sclera: Conjunctivae normal.     Pupils: Pupils are equal, round, and reactive to light.  Neck:     Musculoskeletal: Normal range of motion and neck supple.  Cardiovascular:     Rate and Rhythm: Normal rate and regular rhythm.     Heart sounds: Normal heart sounds. No murmur.  Pulmonary:     Effort: Pulmonary effort is normal. No respiratory distress.     Breath sounds: Normal breath sounds.  Abdominal:     General: Bowel sounds are normal. There is no distension.     Palpations: Abdomen is soft. There is no mass.     Tenderness: There is no abdominal tenderness. There is no guarding.  Genitourinary:    Comments: Prostate enlarged symmetrically, normal age changes Musculoskeletal: Normal range of motion.        General: No tenderness.  Skin:    General: Skin is warm and dry.     Findings: No rash.  Neurological:     General: No focal deficit present.     Mental Status: He is alert and oriented to  person, place, and time.     Deep Tendon Reflexes: Reflexes are normal and symmetric.  Psychiatric:        Mood and Affect: Mood normal.        Behavior: Behavior normal.        Thought Content: Thought content normal.        Judgment: Judgment normal.     Results for orders placed or performed in visit on 02/18/19  Bayer DCA Hb A1c Waived  Result Value Ref Range   HB A1C (BAYER DCA - WAIVED) 6.3 <7.0 %  PSA  Result Value Ref Range   Prostate Specific Ag, Serum 0.9 0.0 - 4.0 ng/mL  Urinalysis, Routine w reflex microscopic  Result Value Ref Range   Specific Gravity, UA 1.010 1.005 - 1.030   pH, UA 5.0 5.0 - 7.5   Color, UA Yellow Yellow   Appearance Ur Clear Clear   Leukocytes,UA Negative Negative   Protein,UA Negative Negative/Trace   Glucose, UA 3+ (A) Negative   Ketones, UA Negative Negative   RBC, UA Negative Negative   Bilirubin, UA Negative Negative   Urobilinogen, Ur 0.2 0.2 - 1.0 mg/dL   Nitrite, UA Negative Negative  TSH  Result Value Ref Range   TSH 0.714 0.450 - 4.500 uIU/mL  CBC with Differential/Platelet  Result Value Ref Range   WBC 12.1 (H) 3.4 - 10.8 x10E3/uL   RBC 5.09 4.14 - 5.80 x10E6/uL  Hemoglobin 15.2 13.0 - 17.7 g/dL   Hematocrit 04.5 40.9 - 51.0 %   MCV 89 79 - 97 fL   MCH 29.9 26.6 - 33.0 pg   MCHC 33.6 31.5 - 35.7 g/dL   RDW 81.1 91.4 - 78.2 %   Platelets 264 150 - 450 x10E3/uL   Neutrophils 71 Not Estab. %   Lymphs 17 Not Estab. %   Monocytes 8 Not Estab. %   Eos 2 Not Estab. %   Basos 1 Not Estab. %   Neutrophils Absolute 8.5 (H) 1.4 - 7.0 x10E3/uL   Lymphocytes Absolute 2.1 0.7 - 3.1 x10E3/uL   Monocytes Absolute 1.0 (H) 0.1 - 0.9 x10E3/uL   EOS (ABSOLUTE) 0.3 0.0 - 0.4 x10E3/uL   Basophils Absolute 0.1 0.0 - 0.2 x10E3/uL   Immature Granulocytes 1 Not Estab. %   Immature Grans (Abs) 0.1 0.0 - 0.1 x10E3/uL  Lipid panel  Result Value Ref Range   Cholesterol, Total 79 (L) 100 - 199 mg/dL   Triglycerides 956 0 - 149 mg/dL   HDL 33  (L) >21 mg/dL   VLDL Cholesterol Cal 21 5 - 40 mg/dL   LDL Chol Calc (NIH) 25 0 - 99 mg/dL   Chol/HDL Ratio 2.4 0.0 - 5.0 ratio  Comprehensive metabolic panel  Result Value Ref Range   Glucose 130 (H) 65 - 99 mg/dL   BUN 17 8 - 27 mg/dL   Creatinine, Ser 3.08 0.76 - 1.27 mg/dL   GFR calc non Af Amer 90 >59 mL/min/1.73   GFR calc Af Amer 104 >59 mL/min/1.73   BUN/Creatinine Ratio 19 10 - 24   Sodium 141 134 - 144 mmol/L   Potassium 4.4 3.5 - 5.2 mmol/L   Chloride 98 96 - 106 mmol/L   CO2 24 20 - 29 mmol/L   Calcium 10.4 (H) 8.6 - 10.2 mg/dL   Total Protein 7.3 6.0 - 8.5 g/dL   Albumin 4.9 (H) 3.8 - 4.8 g/dL   Globulin, Total 2.4 1.5 - 4.5 g/dL   Albumin/Globulin Ratio 2.0 1.2 - 2.2   Bilirubin Total 0.3 0.0 - 1.2 mg/dL   Alkaline Phosphatase 54 39 - 117 IU/L   AST 27 0 - 40 IU/L   ALT 47 (H) 0 - 44 IU/L      Assessment & Plan:   Problem List Items Addressed This Visit      Endocrine   Diabetes mellitus associated with hormonal etiology (HCC)    Other Visit Diagnoses    PE (physical exam), annual    -  Primary       Discussed aspirin prophylaxis for myocardial infarction prevention and decision was it was not indicated  LABORATORY TESTING:  Health maintenance labs ordered today as discussed above.   The natural history of prostate cancer and ongoing controversy regarding screening and potential treatment outcomes of prostate cancer has been discussed with the patient. The meaning of a false positive PSA and a false negative PSA has been discussed. He indicates understanding of the limitations of this screening test and wishes to proceed with screening PSA testing.   IMMUNIZATIONS:   - Tdap: Tetanus vaccination status reviewed: last tetanus booster within 10 years. - Influenza: Refused - Pneumovax: Up to date - Prevnar: Not applicable - HPV: Not applicable - Zostavax vaccine: Refused  SCREENING: - Colonoscopy: reviewed numerous options, including cologuard. he  declines all at this time but states maybe next year  Discussed with patient purpose of the colonoscopy is to  detect colon cancer at curable precancerous or early stages     PATIENT COUNSELING:    Sexuality: Discussed sexually transmitted diseases, partner selection, use of condoms, avoidance of unintended pregnancy  and contraceptive alternatives.   Advised to avoid cigarette smoking.  I discussed with the patient that most people either abstain from alcohol or drink within safe limits (<=14/week and <=4 drinks/occasion for males, <=7/weeks and <= 3 drinks/occasion for females) and that the risk for alcohol disorders and other health effects rises proportionally with the number of drinks per week and how often a drinker exceeds daily limits.  Discussed cessation/primary prevention of drug use and availability of treatment for abuse.   Diet: Encouraged to adjust caloric intake to maintain  or achieve ideal body weight, to reduce intake of dietary saturated fat and total fat, to limit sodium intake by avoiding high sodium foods and not adding table salt, and to maintain adequate dietary potassium and calcium preferably from fresh fruits, vegetables, and low-fat dairy products.    stressed the importance of regular exercise  Injury prevention: Discussed safety belts, safety helmets, smoke detector, smoking near bedding or upholstery.   Dental health: Discussed importance of regular tooth brushing, flossing, and dental visits.   Follow up plan: NEXT PREVENTATIVE PHYSICAL DUE IN 1 YEAR. Return in about 6 months (around 08/19/2019) for 6 month f/u.

## 2019-02-19 LAB — LIPID PANEL
Chol/HDL Ratio: 2.4 ratio (ref 0.0–5.0)
Cholesterol, Total: 79 mg/dL — ABNORMAL LOW (ref 100–199)
HDL: 33 mg/dL — ABNORMAL LOW (ref >39)
LDL Chol Calc (NIH): 25 mg/dL (ref 0–99)
Triglycerides: 115 mg/dL (ref 0–149)
VLDL Cholesterol Cal: 21 mg/dL (ref 5–40)

## 2019-02-19 LAB — TSH: TSH: 0.714 u[IU]/mL (ref 0.450–4.500)

## 2019-02-19 LAB — COMPREHENSIVE METABOLIC PANEL
ALT: 47 IU/L — ABNORMAL HIGH (ref 0–44)
AST: 27 IU/L (ref 0–40)
Albumin/Globulin Ratio: 2 (ref 1.2–2.2)
Albumin: 4.9 g/dL — ABNORMAL HIGH (ref 3.8–4.8)
Alkaline Phosphatase: 54 IU/L (ref 39–117)
BUN/Creatinine Ratio: 19 (ref 10–24)
BUN: 17 mg/dL (ref 8–27)
Bilirubin Total: 0.3 mg/dL (ref 0.0–1.2)
CO2: 24 mmol/L (ref 20–29)
Calcium: 10.4 mg/dL — ABNORMAL HIGH (ref 8.6–10.2)
Chloride: 98 mmol/L (ref 96–106)
Creatinine, Ser: 0.89 mg/dL (ref 0.76–1.27)
GFR calc Af Amer: 104 mL/min/{1.73_m2} (ref >59)
GFR calc non Af Amer: 90 mL/min/{1.73_m2} (ref >59)
Globulin, Total: 2.4 g/dL (ref 1.5–4.5)
Glucose: 130 mg/dL — ABNORMAL HIGH (ref 65–99)
Potassium: 4.4 mmol/L (ref 3.5–5.2)
Sodium: 141 mmol/L (ref 134–144)
Total Protein: 7.3 g/dL (ref 6.0–8.5)

## 2019-02-19 LAB — CBC WITH DIFFERENTIAL/PLATELET
Basophils Absolute: 0.1 10*3/uL (ref 0.0–0.2)
Basos: 1 %
EOS (ABSOLUTE): 0.3 10*3/uL (ref 0.0–0.4)
Eos: 2 %
Hematocrit: 45.3 % (ref 37.5–51.0)
Hemoglobin: 15.2 g/dL (ref 13.0–17.7)
Immature Grans (Abs): 0.1 10*3/uL (ref 0.0–0.1)
Immature Granulocytes: 1 %
Lymphocytes Absolute: 2.1 10*3/uL (ref 0.7–3.1)
Lymphs: 17 %
MCH: 29.9 pg (ref 26.6–33.0)
MCHC: 33.6 g/dL (ref 31.5–35.7)
MCV: 89 fL (ref 79–97)
Monocytes Absolute: 1 10*3/uL — ABNORMAL HIGH (ref 0.1–0.9)
Monocytes: 8 %
Neutrophils Absolute: 8.5 10*3/uL — ABNORMAL HIGH (ref 1.4–7.0)
Neutrophils: 71 %
Platelets: 264 10*3/uL (ref 150–450)
RBC: 5.09 x10E6/uL (ref 4.14–5.80)
RDW: 13.4 % (ref 11.6–15.4)
WBC: 12.1 10*3/uL — ABNORMAL HIGH (ref 3.4–10.8)

## 2019-02-19 LAB — PSA: Prostate Specific Ag, Serum: 0.9 ng/mL (ref 0.0–4.0)

## 2019-02-19 NOTE — Progress Notes (Signed)
LVM for patient to return phone call.   Okay for [facility] to relay result. Please document if result was given if patient calls back.

## 2019-05-27 ENCOUNTER — Telehealth: Payer: Self-pay

## 2019-05-27 NOTE — Telephone Encounter (Signed)
PA for Onglyza initiated and submitted via Cover My Meds. Key: [id number]

## 2019-05-27 NOTE — Telephone Encounter (Signed)
Covered alternatives are metformin, [place], [place], [place], [place], [place], [place], and International aid/development worker.

## 2019-05-28 ENCOUNTER — Telehealth: Payer: Self-pay | Admitting: Family Medicine

## 2019-05-28 NOTE — Telephone Encounter (Signed)
Pt is here stating that his insurance no longer covers his  onglyza 5 mg, Pt states if there is anything else he can take instead.

## 2019-05-29 MED ORDER — SITAGLIPTIN PHOSPHATE 25 MG PO TABS: 25.0000 mg | ORAL_TABLET | Freq: Every day | ORAL | 0 refills | Status: DC

## 2019-05-29 NOTE — Telephone Encounter (Signed)
Routing to provider as PA was denied. Patient is currently taking metformin and farxiga.

## 2019-05-29 NOTE — Telephone Encounter (Signed)
Will switch to [place]. I don't see where he has a f/u scheduled, can someone get him an appt in 3 months for DM recheck? Thank you!

## 2019-05-29 NOTE — Telephone Encounter (Signed)
Sent in [place] in place of onglyza, see other telephone encounter for details

## 2019-05-30 NOTE — Telephone Encounter (Signed)
Called pt let him know that [place] was sent to pharmacy, also scheduled f/u appt.

## 2019-07-22 ENCOUNTER — Telehealth: Payer: Self-pay

## 2019-07-22 MED ORDER — JARDIANCE 25 MG PO TABS: 25.0000 mg | ORAL_TABLET | Freq: Every day | ORAL | 0 refills | Status: DC

## 2019-07-22 NOTE — Telephone Encounter (Signed)
Fax from pharmacy. Marcelline Deist  is no longer covered by pt insurance, need to switch to a covered alternative.   LOV: 02/18/2019 with Roosvelt Maser, PA-C

## 2019-07-22 NOTE — Addendum Note (Signed)
Addended by: Roosvelt Maser E on: 07/22/2019 12:16 PM   Modules accepted: Orders

## 2019-07-22 NOTE — Telephone Encounter (Signed)
Jardiance sent to see if better covered. Pt due for 6 month f/u and will need to schedule for any further refills

## 2019-07-22 NOTE — Telephone Encounter (Signed)
Lvm to make this f.u apt  

## 2019-08-01 ENCOUNTER — Ambulatory Visit: Payer: BLUE CROSS/BLUE SHIELD | Admitting: Family Medicine

## 2019-08-07 ENCOUNTER — Ambulatory Visit (INDEPENDENT_AMBULATORY_CARE_PROVIDER_SITE_OTHER): Payer: Medicare Other | Admitting: Family Medicine

## 2019-08-07 ENCOUNTER — Encounter: Payer: Self-pay | Admitting: Family Medicine

## 2019-08-07 ENCOUNTER — Other Ambulatory Visit: Payer: Self-pay

## 2019-08-07 VITALS — BP 132/75 | HR 60 | Temp 98.1°F | Ht 66.0 in | Wt 151.0 lb

## 2019-08-07 DIAGNOSIS — K219 Gastro-esophageal reflux disease without esophagitis: Secondary | ICD-10-CM | POA: Diagnosis not present

## 2019-08-07 DIAGNOSIS — I2510 Atherosclerotic heart disease of native coronary artery without angina pectoris: Secondary | ICD-10-CM

## 2019-08-07 DIAGNOSIS — E1169 Type 2 diabetes mellitus with other specified complication: Secondary | ICD-10-CM | POA: Diagnosis not present

## 2019-08-07 DIAGNOSIS — I10 Essential (primary) hypertension: Secondary | ICD-10-CM | POA: Diagnosis not present

## 2019-08-07 DIAGNOSIS — R42 Dizziness and giddiness: Secondary | ICD-10-CM

## 2019-08-07 DIAGNOSIS — E782 Mixed hyperlipidemia: Secondary | ICD-10-CM

## 2019-08-07 MED ORDER — MECLIZINE HCL 25 MG PO TABS: 25.0000 mg | ORAL_TABLET | Freq: Three times a day (TID) | ORAL | 1 refills | Status: AC | PRN

## 2019-08-07 MED ORDER — JARDIANCE 25 MG PO TABS: 25.0000 mg | ORAL_TABLET | Freq: Every day | ORAL | 1 refills | Status: DC

## 2019-08-07 MED ORDER — SITAGLIPTIN PHOSPHATE 25 MG PO TABS: 25.0000 mg | ORAL_TABLET | Freq: Every day | ORAL | 1 refills | Status: DC

## 2019-08-07 NOTE — Progress Notes (Signed)
BP 132/75   Pulse 60   Temp 98.1 F (36.7 C) (Oral)   Ht 5\' 6"  (1.676 m)   Wt 151 lb (68.5 kg)   SpO2 97%   BMI 24.37 kg/m    Subjective:    Patient ID: Ivan Larsen, male    DOB: April 20, 1956, 64 y.o.   MRN: [id number]  HPI: Ivan Larsen is a 64 y.o. male  Chief Complaint  Patient presents with  . Diabetes  . Hypertension  . Hyperlipidemia  . Dizziness    meclizine   Presenting today for 6 month f/u chronic conditions.   Was recently diagnosed with vertigo, uses meclizine prn with good relief.   DM - Not regularly checking home BSs. Tolerating medications well without side effects. Denies hypoglycemic episodes. Trying to watch diet and stay active.   HTN - Not regularly checking home BPs. Taking medicines faithfully, tolerating well. Denies CP, SOB, HAs, dizziness.   HLD, CAD, PAD - on lipitor, plavix regimen.   Relevant past medical, surgical, family and social history reviewed and updated as indicated. Interim medical history since our last visit reviewed. Allergies and medications reviewed and updated.  Review of Systems  Per HPI unless specifically indicated above     Objective:    BP 132/75   Pulse 60   Temp 98.1 F (36.7 C) (Oral)   Ht 5\' 6"  (1.676 m)   Wt 151 lb (68.5 kg)   SpO2 97%   BMI 24.37 kg/m   Wt Readings from Last 3 Encounters:  08/07/19 151 lb (68.5 kg)  02/18/19 155 lb (70.3 kg)  08/09/18 150 lb (68 kg)    Physical Exam Vitals and nursing note reviewed.  Constitutional:      Appearance: Normal appearance.  HENT:     Head: Atraumatic.  Eyes:     Extraocular Movements: Extraocular movements intact.     Conjunctiva/sclera: Conjunctivae normal.  Cardiovascular:     Rate and Rhythm: Normal rate and regular rhythm.  Pulmonary:     Effort: Pulmonary effort is normal.     Breath sounds: Normal breath sounds.  Musculoskeletal:        General: Normal range of motion.     Cervical back: Normal range of motion and neck  supple.  Skin:    General: Skin is warm and dry.  Neurological:     General: No focal deficit present.     Mental Status: He is oriented to person, place, and time.  Psychiatric:        Mood and Affect: Mood normal.        Thought Content: Thought content normal.        Judgment: Judgment normal.     Results for orders placed or performed in visit on 08/07/19  Comprehensive metabolic panel  Result Value Ref Range   Glucose 139 (H) 65 - 99 mg/dL   BUN 21 8 - 27 mg/dL   Creatinine, Ser 1.03 0.76 - 1.27 mg/dL   GFR calc non Af Amer 76 >59 mL/min/1.73   GFR calc Af Amer 88 >59 mL/min/1.73   BUN/Creatinine Ratio 20 10 - 24   Sodium 142 134 - 144 mmol/L   Potassium 4.5 3.5 - 5.2 mmol/L   Chloride 101 96 - 106 mmol/L   CO2 23 20 - 29 mmol/L   Calcium 10.1 8.6 - 10.2 mg/dL   Total Protein 6.8 6.0 - 8.5 g/dL   Albumin 4.9 (H) 3.8 - 4.8 g/dL   Globulin,  Total 1.9 1.5 - 4.5 g/dL   Albumin/Globulin Ratio 2.6 (H) 1.2 - 2.2   Bilirubin Total 0.4 0.0 - 1.2 mg/dL   Alkaline Phosphatase 50 39 - 117 IU/L   AST 25 0 - 40 IU/L   ALT 38 0 - 44 IU/L  Lipid Panel w/o Chol/HDL Ratio  Result Value Ref Range   Cholesterol, Total 75 (L) 100 - 199 mg/dL   Triglycerides 409 0 - 149 mg/dL   HDL 34 (L) >81 mg/dL   VLDL Cholesterol Cal 22 5 - 40 mg/dL   LDL Chol Calc (NIH) 19 0 - 99 mg/dL  HgB X9J  Result Value Ref Range   Hgb A1c MFr Bld 6.9 (H) 4.8 - 5.6 %   Est. average glucose Bld gHb Est-mCnc 151 mg/dL      Assessment & Plan:   Problem List Items Addressed This Visit      Cardiovascular and Mediastinum   Atherosclerotic heart disease of native coronary artery without angina pectoris - Primary    Recheck lipids, adjust as needed      Relevant Orders   Comprehensive metabolic panel (Completed)   Lipid Panel w/o Chol/HDL Ratio (Completed)   Benign essential hypertension    Stable and well controlled, continue current regimen        Digestive   GERD (gastroesophageal reflux  disease)    Stable, well controlled. Continue current regimen      Relevant Medications   meclizine (ANTIVERT) 25 MG tablet     Endocrine   Diabetes mellitus associated with hormonal etiology (HCC)    Recheck A1C, adjust as needed. Continue current regimen      Relevant Medications   empagliflozin (JARDIANCE) 25 MG TABS tablet   sitaGLIPtin (JANUVIA) 25 MG tablet   Other Relevant Orders   HgB A1c (Completed)     Other   Hyperlipidemia, mixed    Stable, recheck lipids, adjust as needed. Continue working on lifestyle modifications for good control       Other Visit Diagnoses    Vertigo       Stable on prn meclizine, continue current regimen       Follow up plan: Return in about 6 months (around 02/07/2020) for CPE.

## 2019-08-08 ENCOUNTER — Encounter: Payer: Self-pay | Admitting: Family Medicine

## 2019-08-08 LAB — COMPREHENSIVE METABOLIC PANEL
ALT: 38 IU/L (ref 0–44)
AST: 25 IU/L (ref 0–40)
Albumin/Globulin Ratio: 2.6 — ABNORMAL HIGH (ref 1.2–2.2)
Albumin: 4.9 g/dL — ABNORMAL HIGH (ref 3.8–4.8)
Alkaline Phosphatase: 50 IU/L (ref 39–117)
BUN/Creatinine Ratio: 20 (ref 10–24)
BUN: 21 mg/dL (ref 8–27)
Bilirubin Total: 0.4 mg/dL (ref 0.0–1.2)
CO2: 23 mmol/L (ref 20–29)
Calcium: 10.1 mg/dL (ref 8.6–10.2)
Chloride: 101 mmol/L (ref 96–106)
Creatinine, Ser: 1.03 mg/dL (ref 0.76–1.27)
GFR calc Af Amer: 88 mL/min/{1.73_m2} (ref >59)
GFR calc non Af Amer: 76 mL/min/{1.73_m2} (ref >59)
Globulin, Total: 1.9 g/dL (ref 1.5–4.5)
Glucose: 139 mg/dL — ABNORMAL HIGH (ref 65–99)
Potassium: 4.5 mmol/L (ref 3.5–5.2)
Sodium: 142 mmol/L (ref 134–144)
Total Protein: 6.8 g/dL (ref 6.0–8.5)

## 2019-08-08 LAB — HEMOGLOBIN A1C
Est. average glucose Bld gHb Est-mCnc: 151 mg/dL
Hgb A1c MFr Bld: 6.9 % — ABNORMAL HIGH (ref 4.8–5.6)

## 2019-08-08 LAB — LIPID PANEL W/O CHOL/HDL RATIO
Cholesterol, Total: 75 mg/dL — ABNORMAL LOW (ref 100–199)
HDL: 34 mg/dL — ABNORMAL LOW (ref >39)
LDL Chol Calc (NIH): 19 mg/dL (ref 0–99)
Triglycerides: 124 mg/dL (ref 0–149)
VLDL Cholesterol Cal: 22 mg/dL (ref 5–40)

## 2019-08-11 NOTE — Assessment & Plan Note (Signed)
Stable, well controlled. Continue current regimen 

## 2019-08-11 NOTE — Assessment & Plan Note (Signed)
Stable and well controlled, continue current regimen 

## 2019-08-11 NOTE — Assessment & Plan Note (Signed)
Recheck A1C, adjust as needed. Continue current regimen. 

## 2019-08-11 NOTE — Assessment & Plan Note (Signed)
Stable, recheck lipids, adjust as needed. Continue working on lifestyle modifications for good control

## 2019-08-11 NOTE — Assessment & Plan Note (Signed)
Recheck lipids, adjust as needed 

## 2019-08-28 ENCOUNTER — Ambulatory Visit: Payer: BLUE CROSS/BLUE SHIELD | Admitting: Family Medicine

## 2019-08-29 ENCOUNTER — Telehealth: Payer: Self-pay

## 2019-08-29 NOTE — Telephone Encounter (Signed)
Prior Authorization initiated via CoverMyMeds for Meclizine Key: [id number]

## 2019-08-30 NOTE — Telephone Encounter (Signed)
PA Approved

## 2019-09-24 DIAGNOSIS — I70219 Atherosclerosis of native arteries of extremities with intermittent claudication, unspecified extremity: Secondary | ICD-10-CM | POA: Diagnosis not present

## 2019-09-24 DIAGNOSIS — I77811 Abdominal aortic ectasia: Secondary | ICD-10-CM | POA: Diagnosis not present

## 2019-09-24 DIAGNOSIS — I10 Essential (primary) hypertension: Secondary | ICD-10-CM | POA: Diagnosis not present

## 2019-12-26 ENCOUNTER — Other Ambulatory Visit: Payer: Self-pay | Admitting: Family Medicine

## 2019-12-26 NOTE — Telephone Encounter (Signed)
Requested medication (s) are due for refill today: yes  Requested medication (s) are on the active medication list: yes   Last refill:  01/18/2019 #20 0 refill  Future visit scheduled: yes   Notes to clinic:   not delegated per protocol     Requested Prescriptions  Pending Prescriptions Disp Refills   ondansetron (ZOFRAN-ODT) 4 MG disintegrating tablet [Pharmacy Med Name: ONDANSETRON ODT 4 MG TABLET] 21 tablet 0    Sig: 1 TABLET ORALLY DISSOLVED EVERY 8 HOURS IF NEEDED FOR NAUSEA AND VOMITING      Not Delegated - Gastroenterology: Antiemetics Failed - 12/26/2019  6:53 PM      Failed - This refill cannot be delegated      Passed - Valid encounter within last 6 months    Recent Outpatient Visits           4 months ago Atherosclerosis of native coronary artery without angina pectoris, unspecified whether native or transplanted heart   [facility] Particia Nearing, PA-C   10 months ago PE (physical exam), annual   [facility] Particia Nearing, [place]   11 months ago PE (physical exam), annual   [facility] Crissman, Redge Gainer, MD   1 year ago Essential hypertension   [facility] Crissman, Redge Gainer, MD   1 year ago Essential hypertension   [facility] Crissman, Redge Gainer, MD       Future Appointments             In 1 month Asaro, Jodelle Gross, NP [facility], [facility]

## 2019-12-27 ENCOUNTER — Telehealth: Payer: Self-pay

## 2019-12-27 ENCOUNTER — Telehealth: Payer: Self-pay | Admitting: Family Medicine

## 2019-12-27 NOTE — Telephone Encounter (Signed)
Prior Authorization initiated via CoverMyMeds for Ondansetron 4MG   Key: 

## 2019-12-27 NOTE — Telephone Encounter (Signed)
Copied from CRM [id number]. Topic: General - Inquiry >> Dec 27, 2019  1:15 PM Adrian Prince D wrote: Rachael Fee from [facility] called and said that the prior approval for Zofran 4mg  has been denied on his insurance. The reason was it is not being prescribed for an FDA label or a medically excepted use. Please advise

## 2019-12-27 NOTE — Telephone Encounter (Signed)
Routing to provider.  I listed Nausea and Vomiting as Dx. Listed 2 comorbid conditions.  Still denied Rx.

## 2019-12-27 NOTE — Telephone Encounter (Signed)
Next appointment with Shanda Bumps.

## 2019-12-29 NOTE — Telephone Encounter (Signed)
Please let pt know and see if he would like to try some phenergan instead

## 2019-12-30 NOTE — Telephone Encounter (Signed)
Pt returning call to Brittany. Please advise.  °

## 2019-12-30 NOTE — Progress Notes (Signed)
BP 129/68 (BP Location: Left Arm, Patient Position: Sitting, Cuff Size: Normal)   Pulse (!) 58   Temp 98 F (36.7 C) (Oral)   Ht 5\' 6"  (1.676 m)   Wt 143 lb 6.4 oz (65 kg)   SpO2 98%   BMI 23.15 kg/m    Subjective:    Patient ID: Ivan Larsen, male    DOB: 08/20/1955, 64 y.o.   MRN: [id number]  HPI: Ivan Larsen is a 64 y.o. male presenting with urinary symptoms.  Chief Complaint  Patient presents with  . Urinary Hesitancy    Starting feeling fatigued over the weekend. Patient states he tried to use the restroom but couldn't. Low grade fever off and on.    URINARY SYMPTOMS Reports symptoms started last week with increased urgency and felt like "he was passing a baseball."  He states it is has gotten to a point now where it is not that bad.  Has a history of kidney stones - had to have one "busted" up to pass in the past. Onset: 12/23/2019 Dysuria: yes Urinary frequency: no Urgency: yes Small volume voids: yes Symptom severity: moderate Urinary incontinence: yes Foul odor: no Hematuria: no Abdominal pain: no Back pain: no Suprapubic pain/pressure: yes Flank pain: no Fever:  Off and on last week, 100 Appetite change:  No Nausea: no Vomiting: no Relief with cranberry juice: Not tried Relief with pyridium: Not tried Status: better Previous urinary tract infection: yes Recurrent urinary tract infection: no Sexual activity: not currently sexually active History of sexually transmitted disease: no Penile discharge: no Treatments attempted: ate lemons   Allergies  Allergen Reactions  . Iodinated Diagnostic Agents Nausea Only    Other reaction(s): Vomiting  . Iodine   . Shellfish Allergy Nausea And Vomiting and Other (See Comments)    Sweating  Sweating    . Shellfish-Derived Products Nausea And Vomiting and Other (See Comments)    Other reaction(s): Nausea And Vomiting, Other (See Comments) Sweating  Sweating    Outpatient Encounter Medications as  of 12/31/2019  Medication Sig  . amLODipine (NORVASC) 10 MG tablet Take 1 tablet (10 mg total) by mouth daily.  01/02/2020 atorvastatin (LIPITOR) 40 MG tablet Take 1 tablet (40 mg total) by mouth daily.  . benazepril (LOTENSIN) 40 MG tablet Take 1 tablet (40 mg total) by mouth daily.  . clopidogrel (PLAVIX) 75 MG tablet Take 1 tablet (75 mg total) by mouth daily.  . empagliflozin (JARDIANCE) 25 MG TABS tablet Take 25 mg by mouth daily before breakfast. Needs appt for further refills  . hydrochlorothiazide (HYDRODIURIL) 25 MG tablet Take 1 tablet (25 mg total) by mouth daily.  . meclizine (ANTIVERT) 25 MG tablet Take 1 tablet (25 mg total) by mouth 3 (three) times daily as needed for dizziness or nausea.  . metFORMIN (GLUCOPHAGE) 500 MG tablet Take 2 tablets (1,000 mg total) by mouth 2 (two) times daily with a meal.  . metoprolol tartrate (LOPRESSOR) 50 MG tablet Take 1 tablet (50 mg total) by mouth 2 (two) times daily.  . Omega-3 Fatty Acids (FISH OIL OMEGA-3) 1000 MG CAPS Take by mouth 2 (two) times daily.  . ondansetron (ZOFRAN-ODT) 4 MG disintegrating tablet 1 TABLET ORALLY DISSOLVED EVERY 8 HOURS IF NEEDED FOR NAUSEA AND VOMITING  . sitaGLIPtin (JANUVIA) 25 MG tablet Take 1 tablet (25 mg total) by mouth daily.  . ciprofloxacin (CIPRO) 500 MG tablet Take 1 tablet (500 mg total) by mouth 2 (two) times daily.  Marland Kitchen  tamsulosin (FLOMAX) 0.4 MG CAPS capsule Take 1 capsule (0.4 mg total) by mouth at bedtime. Take at nighttime to minimize dizziness/hypotension   No facility-administered encounter medications on file as of 12/31/2019.   Patient Active Problem List   Diagnosis Date Noted  . Urinary urgency 12/31/2019  . Acute cystitis with hematuria 12/31/2019  . Aortic ectasia, abdominal (HCC) 10/11/2016  . Benign essential hypertension 09/29/2014  . Hyperlipidemia, mixed 04/08/2014  . Atherosclerotic heart disease of native coronary artery without angina pectoris 03/24/2013  . GERD (gastroesophageal reflux  disease) 03/24/2013  . Stented coronary artery 03/24/2013  . Diabetes mellitus associated with hormonal etiology (HCC) 01/29/2013  . Cataracts, bilateral 01/29/2013  . Cortical cataract of both eyes 01/29/2013  . PSC (posterior subcapsular cataract), bilateral 01/29/2013   Past Medical History:  Diagnosis Date  . CAD (coronary artery disease)   . Diabetes mellitus without complication (HCC)   . Elevated liver enzymes   . History of kidney stones   . Hyperlipidemia   . MI (myocardial infarction) (HCC) 07/01/12  . PAD (peripheral artery disease) (HCC)    Relevant past medical, surgical, family and social history reviewed and updated as indicated. Interim medical history since our last visit reviewed. Allergies and medications reviewed and updated.  Review of Systems  Constitutional: Negative for activity change, appetite change, chills, fatigue and fever.  Gastrointestinal: Negative.  Negative for abdominal pain, nausea and vomiting.  Genitourinary: Positive for decreased urine volume, difficulty urinating, dysuria and urgency. Negative for discharge, enuresis, flank pain and hematuria.  Musculoskeletal: Negative.  Negative for back pain.  Skin: Negative.   Neurological: Negative.   Psychiatric/Behavioral: Negative.     Per HPI unless specifically indicated above     Objective:    BP 129/68 (BP Location: Left Arm, Patient Position: Sitting, Cuff Size: Normal)   Pulse (!) 58   Temp 98 F (36.7 C) (Oral)   Ht 5\' 6"  (1.676 m)   Wt 143 lb 6.4 oz (65 kg)   SpO2 98%   BMI 23.15 kg/m   Wt Readings from Last 3 Encounters:  12/31/19 143 lb 6.4 oz (65 kg)  08/07/19 151 lb (68.5 kg)  02/18/19 155 lb (70.3 kg)    Physical Exam Vitals and nursing note reviewed.  Constitutional:      General: He is not in acute distress.    Appearance: Normal appearance. He is not toxic-appearing.  Abdominal:     General: Abdomen is flat. Bowel sounds are normal. There is no distension.      Palpations: Abdomen is soft.     Tenderness: There is no abdominal tenderness. There is no right CVA tenderness, left CVA tenderness or rebound.  Musculoskeletal:        General: No tenderness. Normal range of motion.     Right lower leg: No edema.     Left lower leg: No edema.  Skin:    General: Skin is warm and dry.     Coloration: Skin is not jaundiced or pale.  Neurological:     General: No focal deficit present.     Mental Status: He is alert and oriented to person, place, and time.     Motor: No weakness.     Gait: Gait normal.  Psychiatric:        Mood and Affect: Mood normal.        Behavior: Behavior normal.        Thought Content: Thought content normal.  Judgment: Judgment normal.       Assessment & Plan:   Problem List Items Addressed This Visit      Genitourinary   Acute cystitis with hematuria - Primary    Acute, ongoing.  UA showed 3+ glucose, 3+ blood, leukocytes, and 2+ protein.  Microscopic exam showed >30 WBC, few bacteria, and RBC.  Will send for culture and follow results.  Concerned for UTI, also cannot rule out kidney stone.  Will treat with Ciprofloxacin 500 mg bid x 7 days and flomax qhs for potential kidney stone.  If nausea or vomiting and develop unable to keep fluids down, go to ER.  If not improving by end of week, call or return to clinic.        Other   Urinary urgency    Acute, ongoing.  UA showed 3+ glucose, 3+ blood, leukocytes, and 2+ protein.  Microscopic exam showed >30 WBC, few bacteria, and RBC.  Will send for culture and follow results.  Concerned for UTI, also cannot rule out kidney stone.  Will treat with Ciprofloxacin 500 mg bid x 7 days and flomax qhs for potential kidney stone.  If nausea or vomiting and develop unable to keep fluids down, go to ER.  If not improving by end of week, call or return to clinic.      Relevant Orders   UA/M w/rflx Culture, Routine       Follow up plan: Return if symptoms worsen or fail to  improve.

## 2019-12-30 NOTE — Telephone Encounter (Signed)
Called and LVM asking for patient to please return my call.  

## 2019-12-30 NOTE — Patient Instructions (Signed)
Urinary Frequency, Adult Urinary frequency means urinating more often than usual. You may urinate every 1-2 hours even though you drink a normal amount of fluid and do not have a bladder infection or condition. Although you urinate more often than normal, the total amount of urine produced in a day is normal. With urinary frequency, you may have an urgent need to urinate often. The stress and anxiety of needing to find a bathroom quickly can make this urge worse. This condition may go away on its own or you may need treatment at home. Home treatment may include bladder training, exercises, taking medicines, or making changes to your diet. Follow these instructions at home: Bladder health   Keep a bladder diary if told by your health care provider. Keep track of: ? What you eat and drink. ? How often you urinate. ? How much you urinate.  Follow a bladder training program if told by your health care provider. This may include: ? Learning to delay going to the bathroom. ? Double urinating (voiding). This helps if you are not completely emptying your bladder. ? Scheduled voiding.  Do Kegel exercises as told by your health care provider. Kegel exercises strengthen the muscles that help control urination, which may help the condition. Eating and drinking  If told by your health care provider, make diet changes, such as: ? Avoiding caffeine. ? Drinking fewer fluids, especially alcohol. ? Not drinking in the evening. ? Avoiding foods or drinks that may irritate the bladder. These include coffee, tea, soda, artificial sweeteners, citrus, tomato-based foods, and chocolate. ? Eating foods that help prevent or ease constipation. Constipation can make this condition worse. Your health care provider may recommend that you:  Drink enough fluid to keep your urine pale yellow.  Take over-the-counter or prescription medicines.  Eat foods that are high in fiber, such as beans, whole grains, and fresh  fruits and vegetables.  Limit foods that are high in fat and processed sugars, such as fried or sweet foods. General instructions  Take over-the-counter and prescription medicines only as told by your health care provider.  Keep all follow-up visits as told by your health care provider. This is important. Contact a health care provider if:  You start urinating more often.  You feel pain or irritation when you urinate.  You notice blood in your urine.  Your urine looks cloudy.  You develop a fever.  You begin vomiting. Get help right away if:  You are unable to urinate. Summary  Urinary frequency means urinating more often than usual. With urinary frequency, you may urinate every 1-2 hours even though you drink a normal amount of fluid and do not have a bladder infection or other bladder condition.  Your health care provider may recommend that you keep a bladder diary, follow a bladder training program, or make dietary changes.  If told by your health care provider, do Kegel exercises to strengthen the muscles that help control urination.  Take over-the-counter and prescription medicines only as told by your health care provider.  Contact a health care provider if your symptoms do not improve or get worse. This information is not intended to replace advice given to you by your health care provider. Make sure you discuss any questions you have with your health care provider. Document Revised: 11/09/2017 Document Reviewed: 11/09/2017 Elsevier Patient Education  2020 [employer]  

## 2019-12-31 ENCOUNTER — Encounter: Payer: Self-pay | Admitting: Nurse Practitioner

## 2019-12-31 ENCOUNTER — Ambulatory Visit (INDEPENDENT_AMBULATORY_CARE_PROVIDER_SITE_OTHER): Payer: Medicare Other | Admitting: Nurse Practitioner

## 2019-12-31 ENCOUNTER — Other Ambulatory Visit: Payer: Self-pay

## 2019-12-31 VITALS — BP 129/68 | HR 58 | Temp 98.0°F | Ht 66.0 in | Wt 143.4 lb

## 2019-12-31 DIAGNOSIS — N3001 Acute cystitis with hematuria: Secondary | ICD-10-CM

## 2019-12-31 DIAGNOSIS — R350 Frequency of micturition: Secondary | ICD-10-CM | POA: Diagnosis not present

## 2019-12-31 DIAGNOSIS — R3915 Urgency of urination: Secondary | ICD-10-CM

## 2019-12-31 MED ORDER — CIPROFLOXACIN HCL 500 MG PO TABS
500.0000 mg | ORAL_TABLET | Freq: Two times a day (BID) | ORAL | 0 refills | Status: DC
Start: 2019-12-31 — End: 2020-02-07

## 2019-12-31 MED ORDER — TAMSULOSIN HCL 0.4 MG PO CAPS: 0.4000 mg | ORAL_CAPSULE | Freq: Every day | ORAL | 0 refills | Status: DC

## 2019-12-31 NOTE — Telephone Encounter (Signed)
Called and LVM asking for patient to please return my call.  

## 2019-12-31 NOTE — Assessment & Plan Note (Signed)
Acute, ongoing.  UA showed 3+ glucose, 3+ blood, leukocytes, and 2+ protein.  Microscopic exam showed >30 WBC, few bacteria, and RBC.  Will send for culture and follow results.  Concerned for UTI, also cannot rule out kidney stone.  Will treat with Ciprofloxacin 500 mg bid x 7 days and flomax qhs for potential kidney stone.  If nausea or vomiting and develop unable to keep fluids down, go to ER.  If not improving by end of week, call or return to clinic.

## 2019-12-31 NOTE — Telephone Encounter (Signed)
Patient returned my call. States that he went ahead and picked up the Zofran as it was only $12.

## 2019-12-31 NOTE — Assessment & Plan Note (Addendum)
Acute, ongoing.  UA showed 3+ glucose, 3+ blood, leukocytes, and 2+ protein.  Microscopic exam showed >30 WBC, few bacteria, and RBC.  Will send for culture and follow results.  Concerned for UTI, also cannot rule out kidney stone.  Will treat with Ciprofloxacin 500 mg bid x 7 days and flomax qhs for potential kidney stone.  If nausea or vomiting and develop unable to keep fluids down, go to ER.  If not improving by end of week, call or return to clinic. °

## 2020-01-03 LAB — URINE CULTURE, REFLEX

## 2020-01-03 LAB — UA/M W/RFLX CULTURE, ROUTINE
Bilirubin, UA: NEGATIVE
Ketones, UA: NEGATIVE
Nitrite, UA: NEGATIVE
Specific Gravity, UA: 1.01 (ref 1.005–1.030)
Urobilinogen, Ur: 0.2 mg/dL (ref 0.2–1.0)
pH, UA: 5.5 (ref 5.0–7.5)

## 2020-01-03 LAB — MICROSCOPIC EXAMINATION: WBC, UA: >30 /hpf — AB (ref 0–5)

## 2020-01-03 MED ORDER — SULFAMETHOXAZOLE-TRIMETHOPRIM 800-160 MG PO TABS
1.0000 | ORAL_TABLET | Freq: Two times a day (BID) | ORAL | 0 refills | Status: DC
Start: 2020-01-03 — End: 2020-02-07

## 2020-01-03 NOTE — Addendum Note (Signed)
Addended by: Cathlean Marseilles A on: 01/03/2020 01:12 PM   Modules accepted: Orders

## 2020-01-17 ENCOUNTER — Other Ambulatory Visit: Payer: Self-pay | Admitting: Nurse Practitioner

## 2020-01-17 DIAGNOSIS — I10 Essential (primary) hypertension: Secondary | ICD-10-CM

## 2020-01-17 DIAGNOSIS — E785 Hyperlipidemia, unspecified: Secondary | ICD-10-CM

## 2020-02-07 ENCOUNTER — Other Ambulatory Visit: Payer: Self-pay

## 2020-02-07 ENCOUNTER — Encounter: Payer: Self-pay | Admitting: Nurse Practitioner

## 2020-02-07 ENCOUNTER — Ambulatory Visit (INDEPENDENT_AMBULATORY_CARE_PROVIDER_SITE_OTHER): Payer: Medicare Other | Admitting: Nurse Practitioner

## 2020-02-07 ENCOUNTER — Encounter: Payer: BLUE CROSS/BLUE SHIELD | Admitting: Family Medicine

## 2020-02-07 VITALS — BP 136/65 | HR 66 | Temp 98.2°F | Ht 64.7 in | Wt 149.8 lb

## 2020-02-07 DIAGNOSIS — L97429 Non-pressure chronic ulcer of left heel and midfoot with unspecified severity: Secondary | ICD-10-CM

## 2020-02-07 DIAGNOSIS — Z0000 Encounter for general adult medical examination without abnormal findings: Secondary | ICD-10-CM | POA: Diagnosis not present

## 2020-02-07 DIAGNOSIS — Z1329 Encounter for screening for other suspected endocrine disorder: Secondary | ICD-10-CM

## 2020-02-07 DIAGNOSIS — Z1211 Encounter for screening for malignant neoplasm of colon: Secondary | ICD-10-CM

## 2020-02-07 DIAGNOSIS — E782 Mixed hyperlipidemia: Secondary | ICD-10-CM

## 2020-02-07 DIAGNOSIS — I2510 Atherosclerotic heart disease of native coronary artery without angina pectoris: Secondary | ICD-10-CM | POA: Diagnosis not present

## 2020-02-07 DIAGNOSIS — Z125 Encounter for screening for malignant neoplasm of prostate: Secondary | ICD-10-CM

## 2020-02-07 DIAGNOSIS — E1169 Type 2 diabetes mellitus with other specified complication: Secondary | ICD-10-CM | POA: Diagnosis not present

## 2020-02-07 DIAGNOSIS — I10 Essential (primary) hypertension: Secondary | ICD-10-CM | POA: Diagnosis not present

## 2020-02-07 DIAGNOSIS — E11621 Type 2 diabetes mellitus with foot ulcer: Secondary | ICD-10-CM | POA: Insufficient documentation

## 2020-02-07 LAB — UA/M W/RFLX CULTURE, ROUTINE
Bilirubin, UA: NEGATIVE
Ketones, UA: NEGATIVE
Leukocytes,UA: NEGATIVE
Nitrite, UA: NEGATIVE
Protein,UA: NEGATIVE
Specific Gravity, UA: 1.01 (ref 1.005–1.030)
Urobilinogen, Ur: 0.2 mg/dL (ref 0.2–1.0)
pH, UA: 5 (ref 5.0–7.5)

## 2020-02-07 LAB — BAYER DCA HB A1C WAIVED: HB A1C (BAYER DCA - WAIVED): 6.4 % (ref <7.0)

## 2020-02-07 LAB — MICROSCOPIC EXAMINATION
Bacteria, UA: NONE SEEN
WBC, UA: NONE SEEN /hpf (ref 0–5)

## 2020-02-07 MED ORDER — MUPIROCIN 2 % EX OINT: 1.0000 "application " | TOPICAL_OINTMENT | Freq: Two times a day (BID) | CUTANEOUS | 0 refills | Status: DC

## 2020-02-07 MED ORDER — SULFAMETHOXAZOLE-TRIMETHOPRIM 800-160 MG PO TABS: 1.0000 | ORAL_TABLET | Freq: Two times a day (BID) | ORAL | 0 refills | Status: DC

## 2020-02-07 NOTE — Assessment & Plan Note (Addendum)
Chronic, stable.  A1c 6.4% today.  Continue current medications: metformin 1000 mg twice daily, Jardiance 25 mg daily, .  CMP checked today.  Overdue for eye examination and cataract noted to right eye; strongly encouraged setting up appointment with Opthalmologist.   Foot exam revealed diabetic ulcer worrisome for osteomyelitis.  Will start twice daily Bactrim DS and Bactroban ointment topically.  Left foot x-ray obtained today.  Urgent referral placed to Podiatry for possible debridement and ongoing management of feet.  Referral also placed to wound care center at [facility].  Follow-up in 1 week for wound.

## 2020-02-07 NOTE — Progress Notes (Signed)
BP 136/65   Pulse 66   Temp 98.2 F (36.8 C) (Oral)   Ht 5' 4.7" (1.643 m)   Wt 149 lb 12.8 oz (67.9 kg)   SpO2 99%   BMI 25.16 kg/m    Subjective:    Patient ID: Ivan Larsen, male    DOB: 04/26/1956, 64 y.o.   MRN: [id number]  HPI: Ivan Larsen is a 64 y.o. male presenting on 02/07/2020 for comprehensive medical examination. Current medical complaints include:  FOOT PAIN Duration: days Involved foot: left Mechanism of injury: unknown Location: heel Onset: sudden  Severity: moderate  Quality:  Sharp, shooting Frequency: intermittent Radiation: no Aggravating factors: walking on it   Alleviating factors: Aleve   Status: stable Treatments attempted: Aleve   Relief with NSAIDs?:  moderate Weakness with weight bearing or walking: no Morning stiffness: no Swelling: yes Redness: yes Bruising: no Paresthesias / decreased sensation: yes  Fevers:no   He currently lives with: wife Interim Problems from his last visit: no  Depression Screen done today and results listed below:  Depression screen [facility] 2/9 02/07/2020 02/18/2019 01/18/2018 10/30/2017 07/04/2017  Decreased Interest 0 0 0 0 0  Down, Depressed, Hopeless 0 0 0 0 0  PHQ - 2 Score 0 0 0 0 0  Altered sleeping - 0 0 - -  Tired, decreased energy - 0 1 - -  Change in appetite - 0 0 - -  Feeling bad or failure about yourself  - 0 0 - -  Trouble concentrating - 0 0 - -  Moving slowly or fidgety/restless - 0 0 - -  Suicidal thoughts - 0 0 - -  PHQ-9 Score - 0 1 - -    The patient does not have a history of falls. I did not complete a risk assessment for falls. A plan of care for falls was not documented.   Past Medical History:  Past Medical History:  Diagnosis Date  . CAD (coronary artery disease)   . Diabetes mellitus without complication (HCC)   . Elevated liver enzymes   . History of kidney stones   . Hyperlipidemia   . MI (myocardial infarction) (HCC) 07/01/12  . [facility]per[facility]     Surgical History:  Past Surgical History:  Procedure Laterality Date  . CORONARY STENT PLACEMENT    . EYE SURGERY      Medications:  Current Outpatient Medications on File Prior to Visit  Medication Sig  . amLODipine (NORVASC) 10 MG tablet Take 1 tablet (10 mg total) by mouth daily.  Marland Kitchen atorvastatin (LIPITOR) 40 MG tablet Take 1 tablet (40 mg total) by mouth daily.  . benazepril (LOTENSIN) 40 MG tablet Take 1 tablet (40 mg total) by mouth daily.  . clopidogrel (PLAVIX) 75 MG tablet Take 1 tablet (75 mg total) by mouth daily.  . empagliflozin (JARDIANCE) 25 MG TABS tablet Take 25 mg by mouth daily before breakfast. Needs appt for further refills  . hydrochlorothiazide (HYDRODIURIL) 25 MG tablet Take 1 tablet (25 mg total) by mouth daily.  . meclizine (ANTIVERT) 25 MG tablet Take 1 tablet (25 mg total) by mouth 3 (three) times daily as needed for dizziness or nausea.  . metFORMIN (GLUCOPHAGE) 500 MG tablet Take 2 tablets (1,000 mg total) by mouth 2 (two) times daily with a meal.  . metoprolol tartrate (LOPRESSOR) 50 MG tablet Take 1 tablet (50 mg total) by mouth 2 (two) times daily.  . Omega-3 Fatty Acids (FISH OIL  OMEGA-3) 1000 MG CAPS Take by mouth 2 (two) times daily.  . ondansetron (ZOFRAN-ODT) 4 MG disintegrating tablet 1 TABLET ORALLY DISSOLVED EVERY 8 HOURS IF NEEDED FOR NAUSEA AND VOMITING  . sitaGLIPtin (JANUVIA) 25 MG tablet Take 1 tablet (25 mg total) by mouth daily.  . tamsulosin (FLOMAX) 0.4 MG CAPS capsule Take 1 capsule (0.4 mg total) by mouth at bedtime. Take at nighttime to minimize dizziness/hypotension   No current facility-administered medications on file prior to visit.    Allergies:  Allergies  Allergen Reactions  . Iodinated Diagnostic Agents Nausea Only    Other reaction(s): Vomiting  . Iodine   . Shellfish Allergy Nausea And Vomiting and Other (See Comments)    Sweating  Sweating    . Shellfish-Derived Products Nausea And Vomiting and Other (See  Comments)    Other reaction(s): Nausea And Vomiting, Other (See Comments) Sweating  Sweating     Social History:  Social History   Socioeconomic History  . Marital status: Married    Spouse name: Not on file  . Number of children: Not on file  . Years of education: Not on file  . Highest education level: Not on file  Occupational History  . Not on file  Tobacco Use  . Smoking status: Former Smoker    Types: Cigarettes    Quit date: 01/15/2004    Years since quitting: 16.0  . Smokeless tobacco: Never Used  Vaping Use  . Vaping Use: Never used  Substance and Sexual Activity  . Alcohol use: Yes  . Drug use: No  . Sexual activity: Not on file  Other Topics Concern  . Not on file  Social History Narrative  . Not on file   Social Determinants of Health   Financial Resource Strain:   . Difficulty of Paying Living Expenses: Not on file  Food Insecurity:   . Worried About Programme researcher, broadcasting/film/video in the Last Year: Not on file  . Ran Out of Food in the Last Year: Not on file  Transportation Needs:   . Lack of Transportation (Medical): Not on file  . Lack of Transportation (Non-Medical): Not on file  Physical Activity:   . Days of Exercise per Week: Not on file  . Minutes of Exercise per Session: Not on file  Stress:   . Feeling of Stress : Not on file  Social Connections:   . Frequency of Communication with Friends and Family: Not on file  . Frequency of Social Gatherings with Friends and Family: Not on file  . Attends Religious Services: Not on file  . Active Member of Clubs or Organizations: Not on file  . Attends Banker Meetings: Not on file  . Marital Status: Not on file  Intimate Partner Violence:   . Fear of Current or Ex-Partner: Not on file  . Emotionally Abused: Not on file  . Physically Abused: Not on file  . Sexually Abused: Not on file   Social History   Tobacco Use  Smoking Status Former Smoker  . Types: Cigarettes  . Quit date: 01/15/2004    . Years since quitting: 16.0  Smokeless Tobacco Never Used   Social History   Substance and Sexual Activity  Alcohol Use Yes    Family History:  Family History  Problem Relation Age of Onset  . Diabetes Mother   . Heart attack Father     Past medical history, surgical history, medications, allergies, family history and social history reviewed with patient today  and changes made to appropriate areas of the chart.   Review of Systems  Constitutional: Negative.  Negative for chills, fever and malaise/fatigue.  HENT: Negative.  Negative for ear pain, sinus pain, sore throat and tinnitus.   Eyes: Negative.  Negative for blurred vision, double vision, pain, discharge and redness.  Respiratory: Negative.  Negative for cough, sputum production, shortness of breath and wheezing.   Cardiovascular: Negative.  Negative for chest pain, palpitations and leg swelling.  Gastrointestinal: Negative.  Negative for abdominal pain, constipation, diarrhea, heartburn, nausea and vomiting.  Genitourinary: Negative.  Negative for dysuria, frequency, hematuria and urgency.  Musculoskeletal: Positive for joint pain (heel pain). Negative for back pain, myalgias and neck pain.  Skin:       +painful area on bottom of foot  Neurological: Negative.  Negative for dizziness, tingling, weakness and headaches.  Psychiatric/Behavioral: Negative.  Negative for depression and suicidal ideas. The patient is not nervous/anxious and does not have insomnia.    All other ROS negative except what is listed above and in the HPI.      Objective:    BP 136/65   Pulse 66   Temp 98.2 F (36.8 C) (Oral)   Ht 5' 4.7" (1.643 m)   Wt 149 lb 12.8 oz (67.9 kg)   SpO2 99%   BMI 25.16 kg/m   Wt Readings from Last 3 Encounters:  02/07/20 149 lb 12.8 oz (67.9 kg)  12/31/19 143 lb 6.4 oz (65 kg)  08/07/19 151 lb (68.5 kg)    Physical Exam Vitals and nursing note reviewed.  Constitutional:      General: He is not in acute  distress.    Appearance: Normal appearance. He is not toxic-appearing.  HENT:     Head: Normocephalic and atraumatic.     Right Ear: Tympanic membrane, ear canal and external ear normal. There is no impacted cerumen.     Left Ear: Tympanic membrane, ear canal and external ear normal. There is no impacted cerumen.     Nose: Nose normal. No congestion.     Mouth/Throat:     Mouth: Mucous membranes are moist.     Pharynx: Oropharynx is clear. No oropharyngeal exudate or posterior oropharyngeal erythema.  Eyes:     General: No scleral icterus.    Extraocular Movements: Extraocular movements intact.     Comments: Right eye with cataract  Cardiovascular:     Rate and Rhythm: Normal rate and regular rhythm.     Pulses: Normal pulses.     Heart sounds: Normal heart sounds.  Pulmonary:     Effort: Pulmonary effort is normal. No respiratory distress.     Breath sounds: Normal breath sounds. No wheezing, rhonchi or rales.  Abdominal:     General: Abdomen is flat. Bowel sounds are normal.     Palpations: Abdomen is soft.     Tenderness: There is no right CVA tenderness or left CVA tenderness.  Musculoskeletal:        General: Normal range of motion.     Cervical back: Normal range of motion.     Right lower leg: No edema.     Left lower leg: Edema present.  Lymphadenopathy:     Cervical: No cervical adenopathy.  Skin:    General: Skin is warm and dry.     Capillary Refill: Capillary refill takes less than 2 seconds.     Coloration: Skin is not jaundiced or pale.     Findings: Erythema (LLE) present.  Neurological:     General: No focal deficit present.     Mental Status: He is alert and oriented to person, place, and time.     Motor: No weakness.     Gait: Gait normal.  Psychiatric:        Mood and Affect: Mood normal.        Behavior: Behavior normal.        Thought Content: Thought content normal.        Judgment: Judgment normal.        Assessment & Plan:   Problem  List Items Addressed This Visit      Cardiovascular and Mediastinum   Atherosclerotic heart disease of native coronary artery without angina pectoris   Relevant Orders   Comprehensive metabolic panel   Benign essential hypertension    Chronic, stable.  Continue amlodipine 10 mg, benazepril 40 mg, hydrochlorothiazide 25 mg daily.  CBC, CMP checked today.      Relevant Orders   Lipid Panel w/o Chol/HDL Ratio   CBC with Differential/Platelet   Comprehensive metabolic panel     Endocrine   Diabetes mellitus associated with hormonal etiology (HCC)    Chronic, stable.  A1c 6.4% today.  Continue current medications: metformin 1000 mg twice daily, Jardiance 25 mg daily, .  CMP checked today.  Overdue for eye examination and cataract noted to right eye; strongly encouraged setting up appointment with Opthalmologist.   Foot exam revealed diabetic ulcer worrisome for osteomyelitis.  Will start twice daily Bactrim DS and Bactroban ointment topically.  Left foot x-ray obtained today.  Urgent referral placed to Podiatry for possible debridement and ongoing management of feet.  Referral also placed to wound care center at [facility].  Follow-up in 1 week for wound.      Relevant Orders   Bayer DCA Hb A1c Waived   Lipid Panel w/o Chol/HDL Ratio   UA/M w/rflx Culture, Routine   Ambulatory referral to Podiatry   Diabetic ulcer of left heel associated with type 2 diabetes mellitus (HCC)    Foot exam revealed diabetic ulcer worrisome for osteomyelitis.  Will start twice daily Bactrim DS and Bactroban ointment topically.  Left foot x-ray obtained today.  Urgent referral placed to Podiatry for possible debridement and ongoing management of feet.  Referral also placed to wound care center at [facility].  Follow-up in 1 week for wound.      Relevant Medications   sulfamethoxazole-trimethoprim (BACTRIM DS) 800-160 MG tablet   mupirocin ointment (BACTROBAN) 2 %   Other Relevant Orders   DG Foot Complete Left    AMB referral to wound care center   Ambulatory referral to Podiatry     Other   Hyperlipidemia, mixed    Chronic, stable.  Cholesterol levels and CMP checked today.  Continue atorvastatin 40 mg daily for now.      Relevant Orders   Lipid Panel w/o Chol/HDL Ratio   CBC with Differential/Platelet   Comprehensive metabolic panel    Other Visit Diagnoses    Annual physical exam    -  Primary   Screening for prostate cancer       Relevant Orders   PSA   Screening for thyroid disorder       Relevant Orders   TSH   Screening for colon cancer       Relevant Orders   Cologuard       Discussed aspirin prophylaxis for myocardial infarction prevention and decision was it was not indicated  LABORATORY  TESTING:  Health maintenance labs ordered today as discussed above.   The natural history of prostate cancer and ongoing controversy regarding screening and potential treatment outcomes of prostate cancer has been discussed with the patient. The meaning of a false positive PSA and a false negative PSA has been discussed. He indicates understanding of the limitations of this screening test and wishes to proceed with screening PSA testing.   IMMUNIZATIONS:   - Tdap: Tetanus vaccination status reviewed: last tetanus booster within 10 years. - Influenza: Refused - Pneumovax: Up to date - Prevnar: Not applicable - HPV: Not applicable - Zostavax vaccine: Not applicable  - COVID-19: refused  SCREENING: - Colonoscopy: Cologuard ordered today; refused colonoscopy in past  Discussed with patient purpose of the colonoscopy is to detect colon cancer at curable precancerous or early stages   - AAA Screening: Not applicable  -Hearing Test: Not applicable  -Spirometry: Not applicable   PATIENT COUNSELING:    Sexuality: Discussed sexually transmitted diseases, partner selection, use of condoms, avoidance of unintended pregnancy  and contraceptive alternatives.   Advised to avoid cigarette  smoking.  I discussed with the patient that most people either abstain from alcohol or drink within safe limits (<=14/week and <=4 drinks/occasion for males, <=7/weeks and <= 3 drinks/occasion for females) and that the risk for alcohol disorders and other health effects rises proportionally with the number of drinks per week and how often a drinker exceeds daily limits.  Discussed cessation/primary prevention of drug use and availability of treatment for abuse.   Diet: Encouraged to adjust caloric intake to maintain  or achieve ideal body weight, to reduce intake of dietary saturated fat and total fat, to limit sodium intake by avoiding high sodium foods and not adding table salt, and to maintain adequate dietary potassium and calcium preferably from fresh fruits, vegetables, and low-fat dairy products.    stressed the importance of regular exercise  Injury prevention: Discussed safety belts, safety helmets, smoke detector, smoking near bedding or upholstery.   Dental health: Discussed importance of regular tooth brushing, flossing, and dental visits.   Follow up plan: NEXT PREVENTATIVE PHYSICAL DUE IN 1 YEAR. Return in about 1 week (around 02/14/2020) for wound follow up.

## 2020-02-07 NOTE — Assessment & Plan Note (Signed)
Chronic, stable.  Continue amlodipine 10 mg, benazepril 40 mg, hydrochlorothiazide 25 mg daily.  CBC, CMP checked today.

## 2020-02-07 NOTE — Assessment & Plan Note (Signed)
Chronic, stable.  Cholesterol levels and CMP checked today.  Continue atorvastatin 40 mg daily for now.

## 2020-02-07 NOTE — Patient Instructions (Addendum)
Go here for your foot x-ray: [address], [place] [address]  Preventive Care 65 Years and Older, Male Preventive care refers to lifestyle choices and visits with your health care provider that can promote health and wellness. This includes:  A yearly physical exam. This is also called an annual well check.  Regular dental and eye exams.  Immunizations.  Screening for certain conditions.  Healthy lifestyle choices, such as diet and exercise. What can I expect for my preventive care visit? Physical exam Your health care provider will check:  Height and weight. These may be used to calculate body mass index (BMI), which is a measurement that tells if you are at a healthy weight.  Heart rate and blood pressure.  Your skin for abnormal spots. Counseling Your health care provider may ask you questions about:  Alcohol, tobacco, and drug use.  Emotional well-being.  Home and relationship well-being.  Sexual activity.  Eating habits.  History of falls.  Memory and ability to understand (cognition).  Work and work Statistician. What immunizations do I need?  Influenza (flu) vaccine  This is recommended every year. Tetanus, diphtheria, and pertussis (Tdap) vaccine  You may need a Td booster every 10 years. Varicella (chickenpox) vaccine  You may need this vaccine if you have not already been vaccinated. Zoster (shingles) vaccine  You may need this after age 41. Pneumococcal conjugate (PCV13) vaccine  One dose is recommended after age 28. Pneumococcal polysaccharide (PPSV23) vaccine  One dose is recommended after age 66. Measles, mumps, and rubella (MMR) vaccine  You may need at least one dose of MMR if you were born in 1957 or later. You may also need a second dose. Meningococcal conjugate (MenACWY) vaccine  You may need this if you have certain conditions. Hepatitis A vaccine  You may need this if you have certain conditions or if you travel or work in  places where you may be exposed to hepatitis A. Hepatitis B vaccine  You may need this if you have certain conditions or if you travel or work in places where you may be exposed to hepatitis B. Haemophilus influenzae type b (Hib) vaccine  You may need this if you have certain conditions. You may receive vaccines as individual doses or as more than one vaccine together in one shot (combination vaccines). Talk with your health care provider about the risks and benefits of combination vaccines. What tests do I need? Blood tests  Lipid and cholesterol levels. These may be checked every 5 years, or more frequently depending on your overall health.  Hepatitis C test.  Hepatitis B test. Screening  Lung cancer screening. You may have this screening every year starting at age 56 if you have a 30-pack-year history of smoking and currently smoke or have quit within the past 15 years.  Colorectal cancer screening. All adults should have this screening starting at age 61 and continuing until age 69. Your health care provider may recommend screening at age 2 if you are at increased risk. You will have tests every 1-10 years, depending on your results and the type of screening test.  Prostate cancer screening. Recommendations will vary depending on your family history and other risks.  Diabetes screening. This is done by checking your blood sugar (glucose) after you have not eaten for a while (fasting). You may have this done every 1-3 years.  Abdominal aortic aneurysm (AAA) screening. You may need this if you are a current or former smoker.  Sexually  transmitted disease (STD) testing. Follow these instructions at home: Eating and drinking  Eat a diet that includes fresh fruits and vegetables, whole grains, lean protein, and low-fat dairy products. Limit your intake of foods with high amounts of sugar, saturated fats, and salt.  Take vitamin and mineral supplements as recommended by your health  care provider.  Do not drink alcohol if your health care provider tells you not to drink.  If you drink alcohol: ? Limit how much you have to 0-2 drinks a day. ? Be aware of how much alcohol is in your drink. In the [place] one drink equals one 12 oz bottle of beer (355 mL), one 5 oz glass of wine (148 mL), or one 1 oz glass of hard liquor (44 mL). Lifestyle  Take daily care of your teeth and gums.  Stay active. Exercise for at least 30 minutes on 5 or more days each week.  Do not use any products that contain nicotine or tobacco, such as cigarettes, e-cigarettes, and chewing tobacco. If you need help quitting, ask your health care provider.  If you are sexually active, practice safe sex. Use a condom or other form of protection to prevent STIs (sexually transmitted infections).  Talk with your health care provider about taking a low-dose aspirin or statin. What's next?  Visit your health care provider once a year for a well check visit.  Ask your health care provider how often you should have your eyes and teeth checked.  Stay up to date on all vaccines. This information is not intended to replace advice given to you by your health care provider. Make sure you discuss any questions you have with your health care provider. Document Revised: 04/26/2018 Document Reviewed: 04/26/2018 Elsevier Patient Education  [place].  Diabetes Mellitus and [place] care is an important part of your health, especially when you have diabetes. Diabetes may cause you to have problems because of poor blood flow (circulation) to your feet and legs, which can cause your skin to:  Become thinner and drier.  Break more easily.  Heal more slowly.  Peel and crack. You may also have nerve damage (neuropathy) in your legs and feet, causing decreased feeling in them. This means that you may not notice minor injuries to your feet that could lead to more serious problems. Noticing and  addressing any potential problems early is the best way to prevent future foot problems. How to care for your feet Foot hygiene  Wash your feet daily with warm water and mild soap. Do not use hot water. Then, pat your feet and the areas between your toes until they are completely dry. Do not soak your feet as this can dry your skin.  Trim your toenails straight across. Do not dig under them or around the cuticle. File the edges of your nails with an emery board or nail file.  Apply a moisturizing lotion or petroleum jelly to the skin on your feet and to dry, brittle toenails. Use lotion that does not contain alcohol and is unscented. Do not apply lotion between your toes. Shoes and socks  Wear clean socks or stockings every day. Make sure they are not too tight. Do not wear knee-high stockings since they may decrease blood flow to your legs.  Wear shoes that fit properly and have enough cushioning. Always look in your shoes before you put them on to be sure there are no objects inside.  To break in new shoes, wear  them for just a few hours a day. This prevents injuries on your feet. Wounds, scrapes, corns, and calluses  Check your feet daily for blisters, cuts, bruises, sores, and redness. If you cannot see the bottom of your feet, use a mirror or ask someone for help.  Do not cut corns or calluses or try to remove them with medicine.  If you find a minor scrape, cut, or break in the skin on your feet, keep it and the skin around it clean and dry. You may clean these areas with mild soap and water. Do not clean the area with peroxide, alcohol, or iodine.  If you have a wound, scrape, corn, or callus on your foot, look at it several times a day to make sure it is healing and not infected. Check for: ? Redness, swelling, or pain. ? Fluid or blood. ? Warmth. ? Pus or a bad smell. General instructions  Do not cross your legs. This may decrease blood flow to your feet.  Do not use heating  pads or hot water bottles on your feet. They may burn your skin. If you have lost feeling in your feet or legs, you may not know this is happening until it is too late.  Protect your feet from hot and cold by wearing shoes, such as at the beach or on hot pavement.  Schedule a complete foot exam at least once a year (annually) or more often if you have foot problems. If you have foot problems, report any cuts, sores, or bruises to your health care provider immediately. Contact a health care provider if:  You have a medical condition that increases your risk of infection and you have any cuts, sores, or bruises on your feet.  You have an injury that is not healing.  You have redness on your legs or feet.  You feel burning or tingling in your legs or feet.  You have pain or cramps in your legs and feet.  Your legs or feet are numb.  Your feet always feel cold.  You have pain around a toenail. Get help right away if:  You have a wound, scrape, corn, or callus on your foot and: ? You have pain, swelling, or redness that gets worse. ? You have fluid or blood coming from the wound, scrape, corn, or callus. ? Your wound, scrape, corn, or callus feels warm to the touch. ? You have pus or a bad smell coming from the wound, scrape, corn, or callus. ? You have a fever. ? You have a red line going up your leg. Summary  Check your feet every day for cuts, sores, red spots, swelling, and blisters.  Moisturize feet and legs daily.  Wear shoes that fit properly and have enough cushioning.  If you have foot problems, report any cuts, sores, or bruises to your health care provider immediately.  Schedule a complete foot exam at least once a year (annually) or more often if you have foot problems. This information is not intended to replace advice given to you by your health care provider. Make sure you discuss any questions you have with your health care provider. Document Revised: 01/23/2019  Document Reviewed: 06/03/2016 Elsevier Patient Education  [place].

## 2020-02-07 NOTE — Assessment & Plan Note (Signed)
Foot exam revealed diabetic ulcer worrisome for osteomyelitis.  Will start twice daily Bactrim DS and Bactroban ointment topically.  Left foot x-ray obtained today.  Urgent referral placed to Podiatry for possible debridement and ongoing management of feet.  Referral also placed to wound care center at [facility].  Follow-up in 1 week for wound.

## 2020-02-08 LAB — COMPREHENSIVE METABOLIC PANEL
ALT: 30 IU/L (ref 0–44)
AST: 20 IU/L (ref 0–40)
Albumin/Globulin Ratio: 2.2 (ref 1.2–2.2)
Albumin: 4.9 g/dL — ABNORMAL HIGH (ref 3.8–4.8)
Alkaline Phosphatase: 60 IU/L (ref 44–121)
BUN/Creatinine Ratio: 23 (ref 10–24)
BUN: 23 mg/dL (ref 8–27)
Bilirubin Total: 0.5 mg/dL (ref 0.0–1.2)
CO2: 27 mmol/L (ref 20–29)
Calcium: 9.9 mg/dL (ref 8.6–10.2)
Chloride: 97 mmol/L (ref 96–106)
Creatinine, Ser: 0.99 mg/dL (ref 0.76–1.27)
GFR calc Af Amer: 92 mL/min/{1.73_m2} (ref >59)
GFR calc non Af Amer: 80 mL/min/{1.73_m2} (ref >59)
Globulin, Total: 2.2 g/dL (ref 1.5–4.5)
Glucose: 122 mg/dL — ABNORMAL HIGH (ref 65–99)
Potassium: 4.2 mmol/L (ref 3.5–5.2)
Sodium: 141 mmol/L (ref 134–144)
Total Protein: 7.1 g/dL (ref 6.0–8.5)

## 2020-02-08 LAB — CBC WITH DIFFERENTIAL/PLATELET
Basophils Absolute: 0.1 10*3/uL (ref 0.0–0.2)
Basos: 1 %
EOS (ABSOLUTE): 0.4 10*3/uL (ref 0.0–0.4)
Eos: 4 %
Hematocrit: 43.2 % (ref 37.5–51.0)
Hemoglobin: 14 g/dL (ref 13.0–17.7)
Immature Grans (Abs): 0.1 10*3/uL (ref 0.0–0.1)
Immature Granulocytes: 1 %
Lymphocytes Absolute: 2.1 10*3/uL (ref 0.7–3.1)
Lymphs: 20 %
MCH: 29.1 pg (ref 26.6–33.0)
MCHC: 32.4 g/dL (ref 31.5–35.7)
MCV: 90 fL (ref 79–97)
Monocytes Absolute: 1.1 10*3/uL — ABNORMAL HIGH (ref 0.1–0.9)
Monocytes: 10 %
Neutrophils Absolute: 7.1 10*3/uL — ABNORMAL HIGH (ref 1.4–7.0)
Neutrophils: 64 %
Platelets: 250 10*3/uL (ref 150–450)
RBC: 4.81 x10E6/uL (ref 4.14–5.80)
RDW: 14.4 % (ref 11.6–15.4)
WBC: 10.8 10*3/uL (ref 3.4–10.8)

## 2020-02-08 LAB — LIPID PANEL W/O CHOL/HDL RATIO
Cholesterol, Total: 108 mg/dL (ref 100–199)
HDL: 38 mg/dL — ABNORMAL LOW (ref >39)
LDL Chol Calc (NIH): 47 mg/dL (ref 0–99)
Triglycerides: 133 mg/dL (ref 0–149)
VLDL Cholesterol Cal: 23 mg/dL (ref 5–40)

## 2020-02-08 LAB — PSA: Prostate Specific Ag, Serum: 1.3 ng/mL (ref 0.0–4.0)

## 2020-02-08 LAB — TSH: TSH: 1.95 u[IU]/mL (ref 0.450–4.500)

## 2020-02-10 ENCOUNTER — Ambulatory Visit
Admission: RE | Admit: 2020-02-10 | Discharge: 2020-02-10 | Disposition: A | Discharge status: Home / Self Care | Payer: Medicare Other | Source: Ambulatory Visit | Attending: Nurse Practitioner | Admitting: Nurse Practitioner

## 2020-02-10 ENCOUNTER — Other Ambulatory Visit: Payer: Self-pay

## 2020-02-10 DIAGNOSIS — L97429 Non-pressure chronic ulcer of left heel and midfoot with unspecified severity: Secondary | ICD-10-CM | POA: Insufficient documentation

## 2020-02-10 DIAGNOSIS — M7732 Calcaneal spur, left foot: Secondary | ICD-10-CM | POA: Diagnosis not present

## 2020-02-10 DIAGNOSIS — E11621 Type 2 diabetes mellitus with foot ulcer: Secondary | ICD-10-CM

## 2020-02-10 IMAGING — DX DG FOOT COMPLETE 3+V*L*
3 series · 3 of 3 positions shown · non-contrast
Comparison: None.

CLINICAL DATA: Pain and swelling posteriorly and medially. Diabetes
mellitus.

EXAM:
LEFT FOOT - COMPLETE 3+ VIEW

[foot ap]
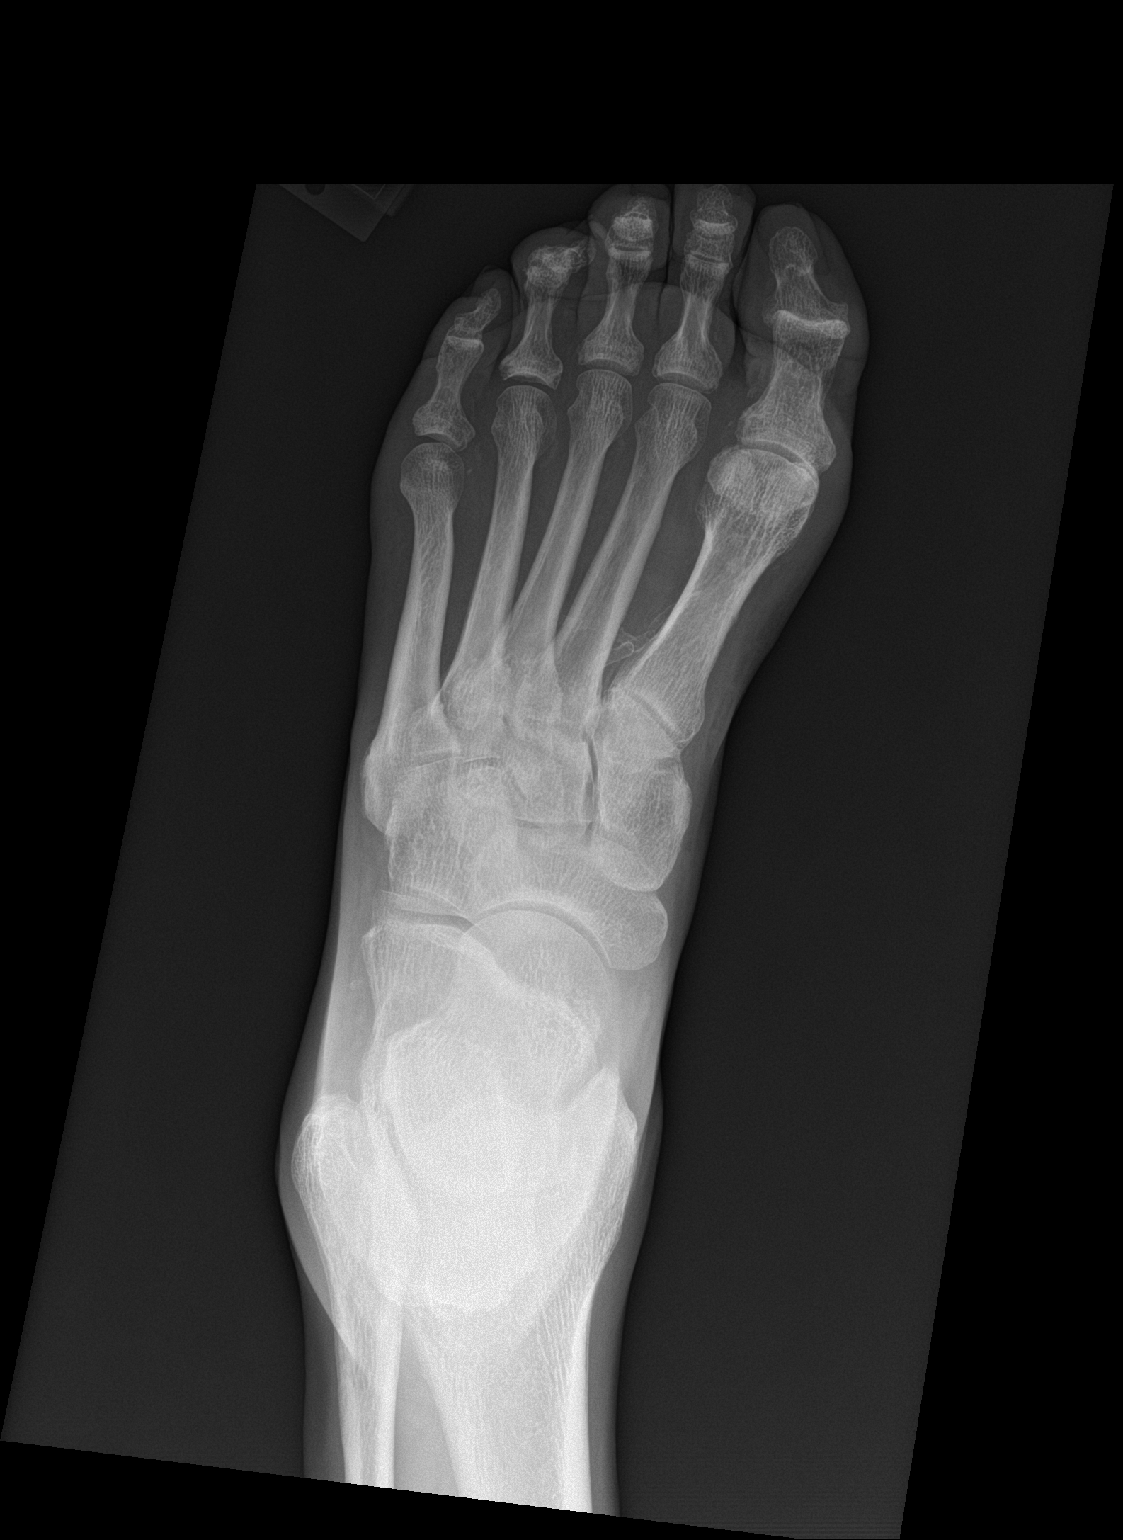

[foot obl]
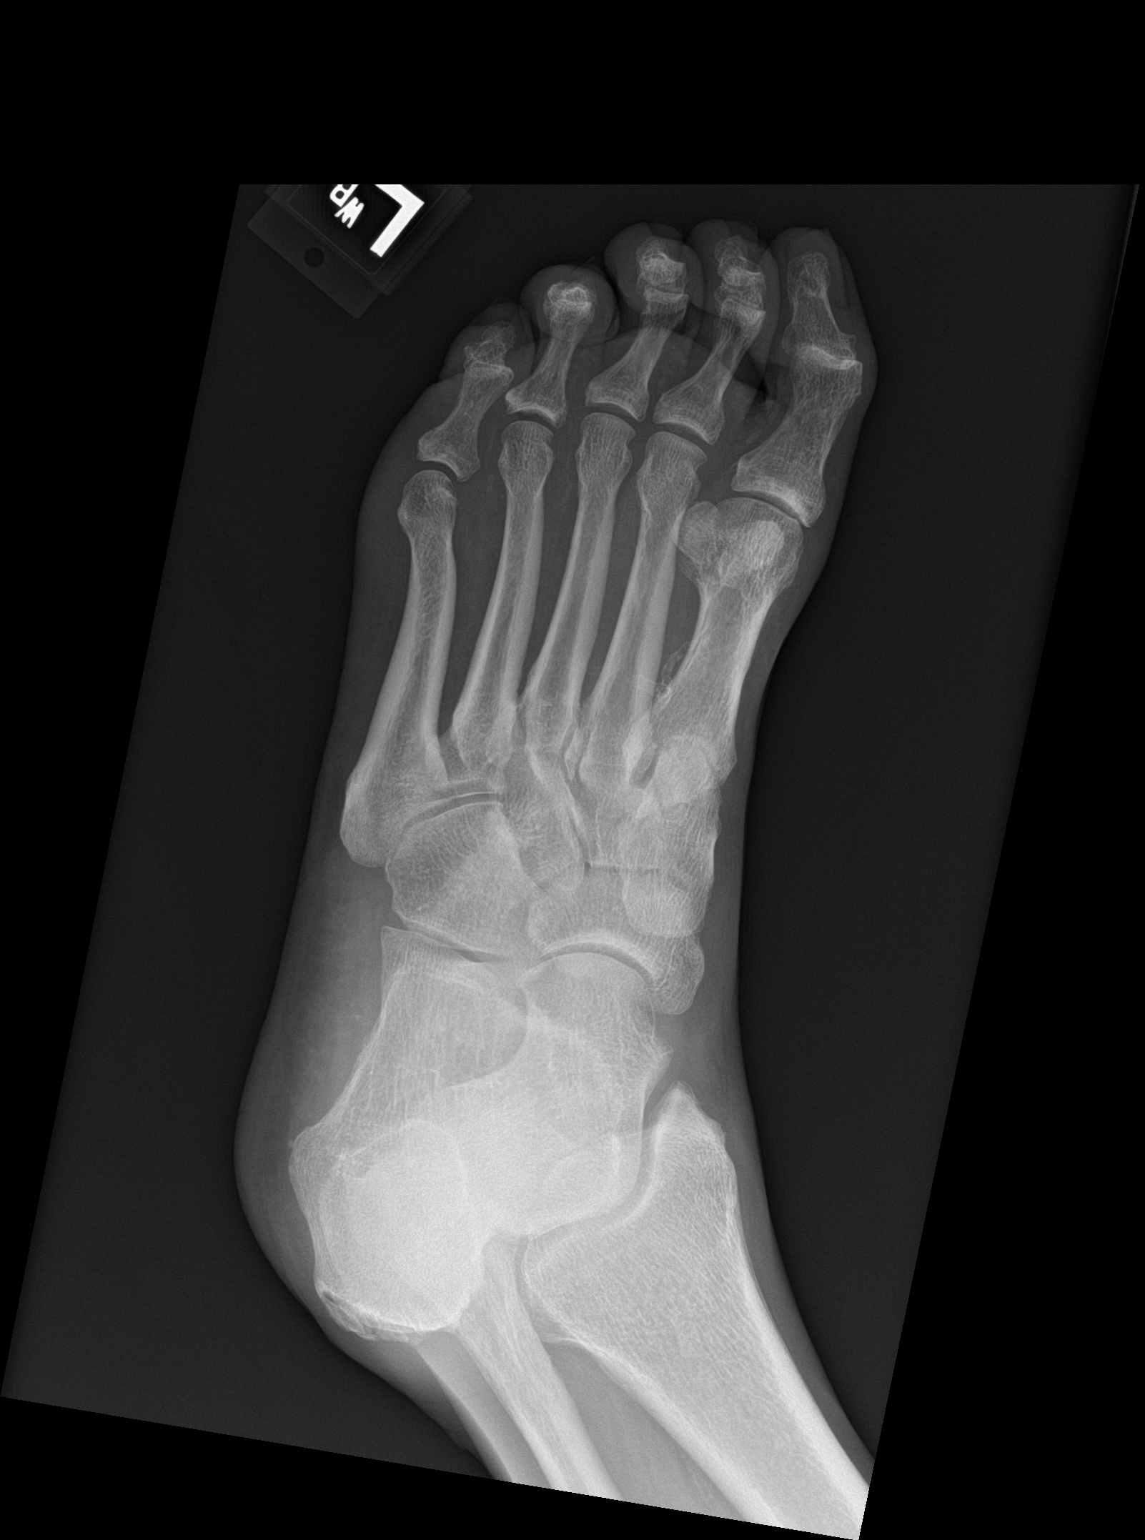

[foot lat]
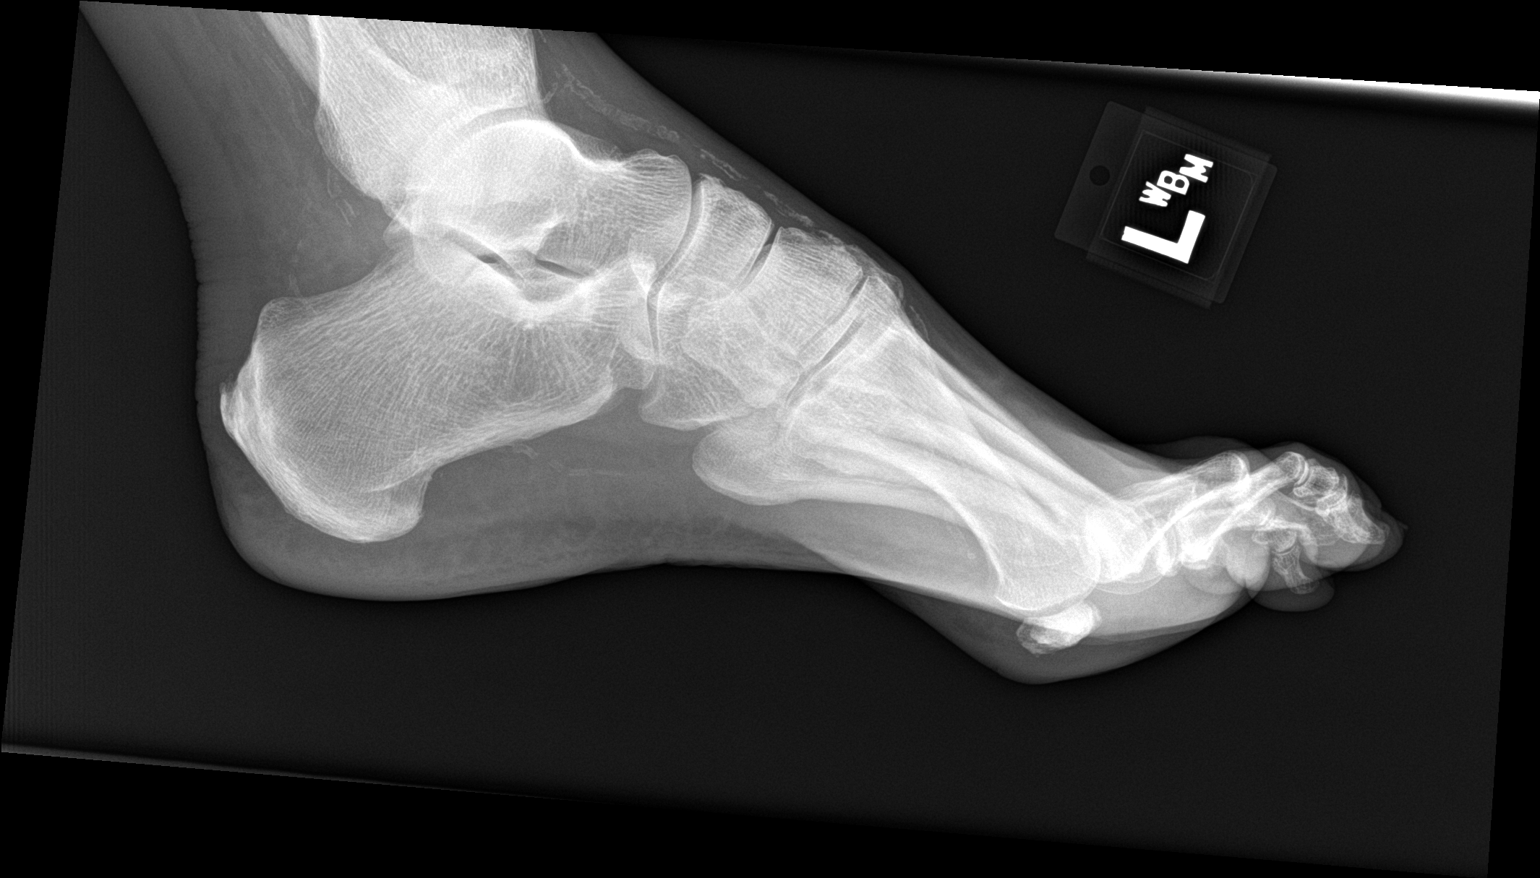

[3 of 3 positions shown; findings below may reference images not displayed]

FINDINGS: Frontal, oblique, and lateral views were obtained. No fracture or
dislocation. Flexion deformities are noted at the first IP joint as
well as at all PIP and D IP joints. There is mild narrowing of the
first MTP joint. Other joint spaces appear normal. There is MATY
MATY. There is a small posterior calcaneal spur. There is no
erosive change or bony destruction. No soft tissue air. There are
multiple foci of arterial vascular calcification.
IMPRESSION: No bony destruction or erosion. No fracture or dislocation.
Narrowing first MTP joint. Flexion of multiple distal joints. Small
posterior calcaneal spur. Multiple foci of arterial vascular
calcification, likely due to known diabetes mellitus.

## 2020-02-11 ENCOUNTER — Ambulatory Visit: Payer: Medicare Other | Admitting: Podiatry

## 2020-02-11 ENCOUNTER — Other Ambulatory Visit: Payer: Self-pay

## 2020-02-11 DIAGNOSIS — E0843 Diabetes mellitus due to underlying condition with diabetic autonomic (poly)neuropathy: Secondary | ICD-10-CM

## 2020-02-11 DIAGNOSIS — L97422 Non-pressure chronic ulcer of left heel and midfoot with fat layer exposed: Secondary | ICD-10-CM

## 2020-02-11 NOTE — Progress Notes (Signed)
   Subjective:  64 y.o. male with PMHx of diabetes mellitus presenting today for evaluation of a heel ulcer to the left heel.  Patient is currently taking oral antibiotic Bactrim DS and applying mupirocin ointment to the left heel.  Prescribed by PCP.  He states that approximately 1-2 weeks ago he developed an ulcer to the left heel with increased pain.  He presents today for follow-up treatment and evaluation   Past Medical History:  Diagnosis Date  . CAD (coronary artery disease)   . Diabetes mellitus without complication (HCC)   . Elevated liver enzymes   . History of kidney stones   . Hyperlipidemia   . MI (myocardial infarction) (HCC) 07/01/12  . PAD (peripheral artery disease) (HCC)       Objective/Physical Exam General: The patient is alert and oriented x3 in no acute distress.  Dermatology:  Wound #1 noted to the 0.3 x 0.4 x 0.1 cm (LxWxD).   To the noted ulceration(s), there is no eschar. There is a moderate amount of slough, fibrin, and necrotic tissue noted. Granulation tissue and wound base is red. There is a minimal amount of serosanguineous drainage noted. There is no exposed bone muscle-tendon ligament or joint. There is no malodor. Periwound integrity is intact. Skin is warm, dry and supple bilateral lower extremities.  Vascular: Palpable pedal pulses bilaterally. No edema or erythema noted. Capillary refill within normal limits.  Neurological: Epicritic and protective threshold diminished bilaterally.   Musculoskeletal Exam: Range of motion within normal limits to all pedal and ankle joints bilateral. Muscle strength 5/5 in all groups bilateral.   Assessment: 1.  Left heel ulcer secondary to diabetes mellitus 2. diabetes mellitus w/ peripheral neuropathy   Plan of Care:  1. Patient was evaluated. 2. medically necessary excisional debridement including subcutaneous tissue was performed using a tissue nipper and a chisel blade. Excisional debridement of all the  necrotic nonviable tissue down to healthy bleeding viable tissue was performed with post-debridement measurements same as pre-. 3. the wound was cleansed and dry sterile dressing applied. 4.  Continue mupirocin ointment 2 times daily  5.  Continue oral Bactrim DS until completed  6.  Patient already has a follow-up appointment with his PCP, Dr. Aline August.  I suspect that the heel ulcer should heal uneventfully.  He is under good care and so far I agree with all the management of the wound that is been done so far 7.  Return to clinic as needed   Felecia Shelling, DPM [facility]  Dr. Felecia Shelling, DPM    [address]                                        [place], [place] [address]                Office [phone]  Fax [phone]

## 2020-02-13 ENCOUNTER — Other Ambulatory Visit: Payer: Self-pay | Admitting: Family Medicine

## 2020-02-13 DIAGNOSIS — I10 Essential (primary) hypertension: Secondary | ICD-10-CM

## 2020-02-13 DIAGNOSIS — I2510 Atherosclerotic heart disease of native coronary artery without angina pectoris: Secondary | ICD-10-CM

## 2020-02-13 DIAGNOSIS — I2583 Coronary atherosclerosis due to lipid rich plaque: Secondary | ICD-10-CM

## 2020-02-13 NOTE — Telephone Encounter (Signed)
Routing to provider  

## 2020-02-13 NOTE — Telephone Encounter (Signed)
Requested medication (s) are due for refill today: Yes  Requested medication (s) are on the active medication list: Yes  Last refill:  01/16/19  Future visit scheduled: Yes  Notes to clinic:  Prescriptions have expired.    Requested Prescriptions  Pending Prescriptions Disp Refills   metoprolol tartrate (LOPRESSOR) 50 MG tablet [Pharmacy Med Name: METOPROLOL TARTRATE 50 MG TAB] 60 tablet 0    Sig: Take 1 tablet (50 mg total) by mouth 2 (two) times daily.      Cardiovascular:  Beta Blockers Passed - 02/13/2020  8:49 AM      Passed - Last BP in normal range    BP Readings from Last 1 Encounters:  02/07/20 136/65          Passed - Last Heart Rate in normal range    Pulse Readings from Last 1 Encounters:  02/07/20 66          Passed - Valid encounter within last 6 months    Recent Outpatient Visits           6 days ago Annual physical exam   [facility] Valentino Nose, NP   1 month ago Acute cystitis with hematuria   [facility] Valentino Nose, NP   6 months ago Atherosclerosis of native coronary artery without angina pectoris, unspecified whether native or transplanted heart   [facility] Particia Nearing, [place]   12 months ago PE (physical exam), annual   [facility] [place], Salley Hews, [place]   1 year ago PE (physical exam), annual   [facility] [place], Redge Gainer, MD       Future Appointments             Tomorrow Valentino Nose, NP [facility], [facility]              clopidogrel (PLAVIX) 75 MG tablet [Pharmacy Med Name: CLOPIDOGREL 75 MG TABLET] 30 tablet 0    Sig: Take 1 tablet (75 mg total) by mouth daily.      Hematology: Antiplatelets - clopidogrel Failed - 02/13/2020  8:49 AM      Failed - Evaluate AST, ALT within 2 months of therapy initiation.      Passed - ALT in normal range and within 360 days    ALT  Date Value Ref Range Status  02/07/2020 30 0 - 44  IU/L Final   SGPT (ALT)  Date Value Ref Range Status  07/01/2012 87 (H) 12 - 78 U/L Final   ALT (SGPT) Piccolo, Waived  Date Value Ref Range Status  07/18/2018 47 10 - 47 U/L Final          Passed - AST in normal range and within 360 days    AST  Date Value Ref Range Status  02/07/2020 20 0 - 40 IU/L Final   SGOT(AST)  Date Value Ref Range Status  07/01/2012 44 (H) 15 - 37 Unit/L Final   AST (SGOT) Piccolo, Waived  Date Value Ref Range Status  07/18/2018 40 (H) 11 - 38 U/L Final          Passed - HCT in normal range and within 180 days    Hematocrit  Date Value Ref Range Status  02/07/2020 43.2 37.5 - 51.0 % Final          Passed - HGB in normal range and within 180 days    Hemoglobin  Date Value Ref Range Status  02/07/2020 14.0  13.0 - 17.7 g/dL Final          Passed - PLT in normal range and within 180 days    Platelets  Date Value Ref Range Status  02/07/2020 250 150 - 450 x10E3/uL Final          Passed - Valid encounter within last 6 months    Recent Outpatient Visits           6 days ago Annual physical exam   [facility] Valentino Nose, NP   1 month ago Acute cystitis with hematuria   [facility] Valentino Nose, NP   6 months ago Atherosclerosis of native coronary artery without angina pectoris, unspecified whether native or transplanted heart   [facility] Particia Nearing, [place]   12 months ago PE (physical exam), annual   [facility] [place], Salley Hews, [place]   1 year ago PE (physical exam), annual   [facility] [place], Redge Gainer, [place]       Future Appointments             Tomorrow Valentino Nose, NP [facility], [facility]

## 2020-02-14 ENCOUNTER — Ambulatory Visit: Payer: Medicare Other | Admitting: Nurse Practitioner

## 2020-02-17 ENCOUNTER — Other Ambulatory Visit: Payer: Self-pay | Admitting: Family Medicine

## 2020-02-17 ENCOUNTER — Other Ambulatory Visit: Payer: Self-pay

## 2020-02-17 ENCOUNTER — Ambulatory Visit (INDEPENDENT_AMBULATORY_CARE_PROVIDER_SITE_OTHER): Payer: Medicare Other | Admitting: Nurse Practitioner

## 2020-02-17 ENCOUNTER — Encounter: Payer: Self-pay | Admitting: Nurse Practitioner

## 2020-02-17 VITALS — BP 106/57 | HR 52 | Temp 97.8°F | Ht 66.0 in | Wt 149.0 lb

## 2020-02-17 DIAGNOSIS — E785 Hyperlipidemia, unspecified: Secondary | ICD-10-CM

## 2020-02-17 DIAGNOSIS — E11621 Type 2 diabetes mellitus with foot ulcer: Secondary | ICD-10-CM

## 2020-02-17 DIAGNOSIS — L97429 Non-pressure chronic ulcer of left heel and midfoot with unspecified severity: Secondary | ICD-10-CM | POA: Diagnosis not present

## 2020-02-17 DIAGNOSIS — Z5189 Encounter for other specified aftercare: Secondary | ICD-10-CM | POA: Diagnosis not present

## 2020-02-17 DIAGNOSIS — I10 Essential (primary) hypertension: Secondary | ICD-10-CM

## 2020-02-17 NOTE — Assessment & Plan Note (Signed)
Acute, ongoing.  Appears to be healing well s/p debridement with Podiatrist on 9/28.  Will continue the oral antibiotic through Wednesday and continue the twice daily bactroban until completely healed.  Follow up in 2 weeks to measure for complete resolution.  Discussed ongoing wound care and maintenance of both feet with ongoing Diabetes.

## 2020-02-17 NOTE — Telephone Encounter (Signed)
Requested Prescriptions  Pending Prescriptions Disp Refills  . atorvastatin (LIPITOR) 40 MG tablet [Pharmacy Med Name: ATORVASTATIN 40 MG TABLET] 90 tablet 4    Sig: Take 1 tablet (40 mg total) by mouth daily.     Cardiovascular:  Antilipid - Statins Failed - 02/17/2020  9:25 AM      Failed - LDL in normal range and within 360 days    Ldl Cholesterol, Calc  Date Value Ref Range Status  07/02/2012 4 0 - 100 mg/dL Final   LDL Chol Calc (NIH)  Date Value Ref Range Status  02/07/2020 47 0 - 99 mg/dL Final         Failed - HDL in normal range and within 360 days    HDL Cholesterol  Date Value Ref Range Status  07/02/2012 28 (L) 40 - 60 mg/dL Final   HDL  Date Value Ref Range Status  02/07/2020 38 (L) >39 mg/dL Final         Passed - Total Cholesterol in normal range and within 360 days    Cholesterol, Total  Date Value Ref Range Status  02/07/2020 108 100 - 199 mg/dL Final   Cholesterol  Date Value Ref Range Status  07/02/2012 72 0 - 200 mg/dL Final   Cholesterol Piccolo, Waived  Date Value Ref Range Status  07/18/2018 82 <200 mg/dL Final    Comment:                            Desirable                <200                         Borderline High      200- 239                         High                     >239          Passed - Triglycerides in normal range and within 360 days    Triglycerides  Date Value Ref Range Status  02/07/2020 133 0 - 149 mg/dL Final  19/37/9024 097 0 - 200 mg/dL Final   Triglycerides Piccolo,Waived  Date Value Ref Range Status  07/18/2018 153 (H) <150 mg/dL Final    Comment:                            Normal                   <150                         Borderline High     150 - 199                         High                200 - 499                         Very High                >499  Passed - Patient is not pregnant      Passed - Valid encounter within last 12 months    Recent Outpatient Visits          Today  Visit for wound check   [facility] Valentino Nose, NP   1 week ago Annual physical exam   [facility] Valentino Nose, NP   1 month ago Acute cystitis with hematuria   [facility] Valentino Nose, NP   6 months ago Atherosclerosis of native coronary artery without angina pectoris, unspecified whether native or transplanted heart   [facility] Particia Nearing, [place]   12 months ago PE (physical exam), annual   [facility] Particia Nearing, [place]      Future Appointments            In 3 weeks Valentino Nose, NP [facility], [facility]           . amLODipine (NORVASC) 10 MG tablet [Pharmacy Med Name: AMLODIPINE BESYLATE 10 MG TAB] 90 tablet 1    Sig: Take 1 tablet (10 mg total) by mouth daily.     Cardiovascular:  Calcium Channel Blockers Passed - 02/17/2020  9:25 AM      Passed - Last BP in normal range    BP Readings from Last 1 Encounters:  02/17/20 (!) 106/57         Passed - Valid encounter within last 6 months    Recent Outpatient Visits          Today Visit for wound check   [facility] Valentino Nose, NP   1 week ago Annual physical exam   [facility] Valentino Nose, NP   1 month ago Acute cystitis with hematuria   [facility] Valentino Nose, NP   6 months ago Atherosclerosis of native coronary artery without angina pectoris, unspecified whether native or transplanted heart   [facility] Particia Nearing, [place]   12 months ago PE (physical exam), annual   [facility] Particia Nearing, [place]      Future Appointments            In 3 weeks Valentino Nose, NP [facility], [facility]           . benazepril (LOTENSIN) 40 MG tablet [Pharmacy Med Name: BENAZEPRIL HCL 40 MG TABLET] 90 tablet 1    Sig: Take 1 tablet (40 mg total) by mouth daily.     Cardiovascular:   ACE Inhibitors Passed - 02/17/2020  9:25 AM      Passed - Cr in normal range and within 180 days    Creatinine  Date Value Ref Range Status  07/03/2012 0.86 0.60 - 1.30 mg/dL Final   Creatinine, Ser  Date Value Ref Range Status  02/07/2020 0.99 0.76 - 1.27 mg/dL Final         Passed - K in normal range and within 180 days    Potassium  Date Value Ref Range Status  02/07/2020 4.2 3.5 - 5.2 mmol/L Final  07/03/2012 4.0 3.5 - 5.1 mmol/L Final         Passed - Patient is not pregnant      Passed - Last BP in normal range    BP Readings from Last 1 Encounters:  02/17/20 (!) 106/57         Passed - Valid encounter within last 6 months  Recent Outpatient Visits          Today Visit for wound check   [facility] Valentino Nose, NP   1 week ago Annual physical exam   [facility] Valentino Nose, NP   1 month ago Acute cystitis with hematuria   [facility] Valentino Nose, NP   6 months ago Atherosclerosis of native coronary artery without angina pectoris, unspecified whether native or transplanted heart   [facility] Particia Nearing, [place]   12 months ago PE (physical exam), annual   [facility], Salley Hews, [place]      Future Appointments            In 3 weeks Valentino Nose, NP [facility], [facility]

## 2020-02-17 NOTE — Patient Instructions (Signed)
Wound Care, Adult Taking care of your wound properly can help to prevent pain, infection, and scarring. It can also help your wound to heal more quickly. How to care for your wound Wound care      Follow instructions from your health care provider about how to take care of your wound. Make sure you: ? Wash your hands with soap and water before you change the bandage (dressing). If soap and water are not available, use hand sanitizer. ? Change your dressing as told by your health care provider. ? Leave stitches (sutures), skin glue, or adhesive strips in place. These skin closures may need to stay in place for 2 weeks or longer. If adhesive strip edges start to loosen and curl up, you may trim the loose edges. Do not remove adhesive strips completely unless your health care provider tells you to do that.  Check your wound area every day for signs of infection. Check for: ? Redness, swelling, or pain. ? Fluid or blood. ? Warmth. ? Pus or a bad smell.  Ask your health care provider if you should clean the wound with mild soap and water. Doing this may include: ? Using a clean towel to pat the wound dry after cleaning it. Do not rub or scrub the wound. ? Applying a cream or ointment. Do this only as told by your health care provider. ? Covering the incision with a clean dressing.  Ask your health care provider when you can leave the wound uncovered.  Keep the dressing dry until your health care provider says it can be removed. Do not take baths, swim, use a hot tub, or do anything that would put the wound underwater until your health care provider approves. Ask your health care provider if you can take showers. You may only be allowed to take sponge baths. Medicines   If you were prescribed an antibiotic medicine, cream, or ointment, take or use the antibiotic as told by your health care provider. Do not stop taking or using the antibiotic even if your condition improves.  Take  over-the-counter and prescription medicines only as told by your health care provider. If you were prescribed pain medicine, take it 30 or more minutes before you do any wound care or as told by your health care provider. General instructions  Return to your normal activities as told by your health care provider. Ask your health care provider what activities are safe.  Do not scratch or pick at the wound.  Do not use any products that contain nicotine or tobacco, such as cigarettes and e-cigarettes. These may delay wound healing. If you need help quitting, ask your health care provider.  Keep all follow-up visits as told by your health care provider. This is important.  Eat a diet that includes protein, vitamin A, vitamin C, and other nutrient-rich foods to help the wound heal. ? Foods rich in protein include meat, dairy, beans, nuts, and other sources. ? Foods rich in vitamin A include carrots and dark green, leafy vegetables. ? Foods rich in vitamin C include citrus, tomatoes, and other fruits and vegetables. ? Nutrient-rich foods have protein, carbohydrates, fat, vitamins, or minerals. Eat a variety of healthy foods including vegetables, fruits, and whole grains. Contact a health care provider if:  You received a tetanus shot and you have swelling, severe pain, redness, or bleeding at the injection site.  Your pain is not controlled with medicine.  You have redness, swelling, or pain around the wound.    You have fluid or blood coming from the wound.  Your wound feels warm to the touch.  You have pus or a bad smell coming from the wound.  You have a fever or chills.  You are nauseous or you vomit.  You are dizzy. Get help right away if:  You have a red streak going away from your wound.  The edges of the wound open up and separate.  Your wound is bleeding, and the bleeding does not stop with gentle pressure.  You have a rash.  You faint.  You have trouble  breathing. Summary  Always wash your hands with soap and water before changing your bandage (dressing).  To help with healing, eat foods that are rich in protein, vitamin A, vitamin C, and other nutrients.  Check your wound every day for signs of infection. Contact your health care provider if you suspect that your wound is infected. This information is not intended to replace advice given to you by your health care provider. Make sure you discuss any questions you have with your health care provider. Document Revised: 08/20/2018 Document Reviewed: 11/17/2015 Elsevier Patient Education  2020 [employer].  

## 2020-02-17 NOTE — Telephone Encounter (Signed)
Requested Prescriptions  Pending Prescriptions Disp Refills  . JARDIANCE 25 MG TABS tablet [Pharmacy Med Name: JARDIANCE 25 MG TABLET] 30 tablet 0    Sig: Take 25 mg by mouth daily before breakfast. Needs appt for further refills     Endocrinology:  Diabetes - SGLT2 Inhibitors Failed - 02/17/2020  9:25 AM      Failed - LDL in normal range and within 360 days    Ldl Cholesterol, Calc  Date Value Ref Range Status  07/02/2012 4 0 - 100 mg/dL Final   LDL Chol Calc (NIH)  Date Value Ref Range Status  02/07/2020 47 0 - 99 mg/dL Final         Passed - Cr in normal range and within 360 days    Creatinine  Date Value Ref Range Status  07/03/2012 0.86 0.60 - 1.30 mg/dL Final   Creatinine, Ser  Date Value Ref Range Status  02/07/2020 0.99 0.76 - 1.27 mg/dL Final         Passed - HBA1C is between 0 and 7.9 and within 180 days    Hemoglobin A1C  Date Value Ref Range Status  12/22/2015 7.3  Final   HB A1C (BAYER DCA - WAIVED)  Date Value Ref Range Status  02/07/2020 6.4 <7.0 % Final    Comment:                                          Diabetic Adult            <7.0                                       Healthy Adult        4.3 - 5.7                                                           (DCCT/NGSP) American Diabetes Association's Summary of Glycemic Recommendations for Adults with Diabetes: Hemoglobin A1c <7.0%. More stringent glycemic goals (A1c <6.0%) may further reduce complications at the cost of increased risk of hypoglycemia.          Passed - eGFR in normal range and within 360 days    EGFR (African American)  Date Value Ref Range Status  07/03/2012 >60  Final   GFR calc Af Amer  Date Value Ref Range Status  02/07/2020 92 >59 mL/min/1.73 Final    Comment:    **[facility] currently reports eGFR in compliance with the current**   recommendations of the [employer]. [facility] will   update reporting as new guidelines are published from the NKF-ASN   Task  force.    EGFR (Non-African Amer.)  Date Value Ref Range Status  07/03/2012 >60  Final    Comment:    eGFR values <40m/min/1.73 m2 may be an indication of chronic kidney disease (CKD). Calculated eGFR is useful in patients with stable renal function. The eGFR calculation will not be reliable in acutely ill patients when serum creatinine is changing rapidly. It is not useful in  patients on dialysis. The eGFR calculation may not be applicable to  patients at the low and high extremes of body sizes, pregnant women, and vegetarians.    GFR calc non Af Amer  Date Value Ref Range Status  02/07/2020 80 >59 mL/min/1.73 Final         Passed - Valid encounter within last 6 months    Recent Outpatient Visits          Today Visit for wound check   [facility] Eulogio Bear, NP   1 week ago Annual physical exam   [facility] Eulogio Bear, NP   1 month ago Acute cystitis with hematuria   [place], [place]   6 months ago Atherosclerosis of native coronary artery without angina pectoris, unspecified whether native or transplanted heart   [facility] [place], [place]   12 months ago PE (physical exam), annual   [facility], Lilia Argue, [place]      Future Appointments            In 3 weeks Eulogio Bear, NP [facility], [place]

## 2020-02-17 NOTE — Progress Notes (Signed)
BP (!) 106/57 (BP Location: Right Arm, Patient Position: Sitting)   Pulse (!) 52   Temp 97.8 F (36.6 C) (Oral)   Ht 5\' 6"  (1.676 m)   Wt 149 lb (67.6 kg)   SpO2 98%   BMI 24.05 kg/m    Subjective:    Patient ID: Ivan Larsen, male    DOB: Jun 09, 1955, 64 y.o.   MRN: [id number]  HPI: Ivan Larsen is a 63 y.o. male presenting for wound check.  Chief Complaint  Patient presents with  . Wound Check    follow up - left heel, pain stills comes and goes   WOUND CHECK Patient reports his wound is doing much better.  He saw the Podiatrist last week who removed much of the dead tissue down to healthy bleeding tissue.  Since the excisional debridement, he reports the size of the ulcer has decreased.  He has continued putting the bactroban ointment on it twice daily and does feel a stinging sensation when applying the ointment. He has not had any issues with redness, swelling, or drainage around the site of the wound.  He denies fevers, loss of appetite, dizziness, nausea, or vomiting.  He will finish the antibiotics up this Wednesday and reports tolerating them well.  Allergies  Allergen Reactions  . Iodinated Diagnostic Agents Nausea Only    Other reaction(s): Vomiting  . Iodine   . Shellfish Allergy Nausea And Vomiting and Other (See Comments)    Sweating  Sweating    . Shellfish-Derived Products Nausea And Vomiting and Other (See Comments)    Other reaction(s): Nausea And Vomiting, Other (See Comments) Sweating  Sweating    Outpatient Encounter Medications as of 02/17/2020  Medication Sig  . amLODipine (NORVASC) 10 MG tablet Take 1 tablet (10 mg total) by mouth daily.  04/18/2020 atorvastatin (LIPITOR) 40 MG tablet Take 1 tablet (40 mg total) by mouth daily.  . benazepril (LOTENSIN) 40 MG tablet Take 1 tablet (40 mg total) by mouth daily.  . clopidogrel (PLAVIX) 75 MG tablet Take 1 tablet (75 mg total) by mouth daily.  . empagliflozin (JARDIANCE) 25 MG TABS tablet Take 25 mg by  mouth daily before breakfast. Needs appt for further refills  . hydrochlorothiazide (HYDRODIURIL) 25 MG tablet Take 1 tablet (25 mg total) by mouth daily.  . meclizine (ANTIVERT) 25 MG tablet Take 1 tablet (25 mg total) by mouth 3 (three) times daily as needed for dizziness or nausea.  . metFORMIN (GLUCOPHAGE) 500 MG tablet Take 2 tablets (1,000 mg total) by mouth 2 (two) times daily with a meal.  . metoprolol tartrate (LOPRESSOR) 50 MG tablet Take 1 tablet (50 mg total) by mouth 2 (two) times daily.  . mupirocin ointment (BACTROBAN) 2 % Place 1 application into the nose 2 (two) times daily.  . Omega-3 Fatty Acids (FISH OIL OMEGA-3) 1000 MG CAPS Take by mouth 2 (two) times daily.  . ondansetron (ZOFRAN-ODT) 4 MG disintegrating tablet 1 TABLET ORALLY DISSOLVED EVERY 8 HOURS IF NEEDED FOR NAUSEA AND VOMITING  . sitaGLIPtin (JANUVIA) 25 MG tablet Take 1 tablet (25 mg total) by mouth daily.  Marland Kitchen sulfamethoxazole-trimethoprim (BACTRIM DS) 800-160 MG tablet Take 1 tablet by mouth 2 (two) times daily.  . tamsulosin (FLOMAX) 0.4 MG CAPS capsule Take 1 capsule (0.4 mg total) by mouth at bedtime. Take at nighttime to minimize dizziness/hypotension   No facility-administered encounter medications on file as of 02/17/2020.   Patient Active Problem List   Diagnosis Date Noted  .  Diabetic ulcer of left heel associated with type 2 diabetes mellitus (HCC) 02/07/2020  . Urinary urgency 12/31/2019  . Acute cystitis with hematuria 12/31/2019  . Aortic ectasia, abdominal (HCC) 10/11/2016  . Benign essential hypertension 09/29/2014  . Hyperlipidemia, mixed 04/08/2014  . Atherosclerotic heart disease of native coronary artery without angina pectoris 03/24/2013  . GERD (gastroesophageal reflux disease) 03/24/2013  . Stented coronary artery 03/24/2013  . Diabetes mellitus associated with hormonal etiology (HCC) 01/29/2013  . Cataracts, bilateral 01/29/2013  . Cortical cataract of both eyes 01/29/2013  . PSC  (posterior subcapsular cataract), bilateral 01/29/2013   Past Medical History:  Diagnosis Date  . CAD (coronary artery disease)   . Diabetes mellitus without complication (HCC)   . Elevated liver enzymes   . History of kidney stones   . Hyperlipidemia   . MI (myocardial infarction) (HCC) 07/01/12  . PAD (peripheral artery disease) (HCC)     Relevant past medical, surgical, family and social history reviewed and updated as indicated. Interim medical history since our last visit reviewed.  Review of Systems  Constitutional: Negative.  Negative for activity change, appetite change, diaphoresis, fatigue and fever.  Gastrointestinal: Negative.  Negative for nausea and vomiting.  Skin: Positive for wound. Negative for color change, pallor and rash.  Neurological: Negative.  Negative for dizziness, light-headedness and headaches.  Psychiatric/Behavioral: Negative.     Per HPI unless specifically indicated above     Objective:    BP (!) 106/57 (BP Location: Right Arm, Patient Position: Sitting)   Pulse (!) 52   Temp 97.8 F (36.6 C) (Oral)   Ht 5\' 6"  (1.676 m)   Wt 149 lb (67.6 kg)   SpO2 98%   BMI 24.05 kg/m   Wt Readings from Last 3 Encounters:  02/17/20 149 lb (67.6 kg)  02/07/20 149 lb 12.8 oz (67.9 kg)  12/31/19 143 lb 6.4 oz (65 kg)    Physical Exam Vitals and nursing note reviewed.  Constitutional:      General: He is not in acute distress.    Appearance: Normal appearance. He is not toxic-appearing.  Abdominal:     General: Abdomen is flat. There is no distension.     Palpations: Abdomen is soft.  Skin:    Coloration: Skin is not jaundiced or pale.     Findings: No erythema.       Neurological:     Mental Status: He is alert and oriented to person, place, and time.     Motor: No weakness.     Gait: Gait normal.  Psychiatric:        Mood and Affect: Mood normal.        Behavior: Behavior normal.        Thought Content: Thought content normal.         Judgment: Judgment normal.        Assessment & Plan:   Problem List Items Addressed This Visit      Endocrine   Diabetic ulcer of left heel associated with type 2 diabetes mellitus (HCC)    Acute, ongoing.  Appears to be healing well s/p debridement with Podiatrist on 9/28.  Will continue the oral antibiotic through Wednesday and continue the twice daily bactroban until completely healed.  Follow up in 2 weeks to measure for complete resolution.  Discussed ongoing wound care and maintenance of both feet with ongoing Diabetes.        Other Visit Diagnoses    Visit for wound check    -  Primary       Follow up plan: Return in about 2 weeks (around 03/02/2020) for wound check.

## 2020-02-17 NOTE — Telephone Encounter (Signed)
Requested Prescriptions  Pending Prescriptions Disp Refills  . JARDIANCE 25 MG TABS tablet [Pharmacy Med Name: JARDIANCE 25 MG TABLET] 30 tablet 0    Sig: Take 25 mg by mouth daily before breakfast. Needs appt for further refills     Endocrinology:  Diabetes - SGLT2 Inhibitors Failed - 02/17/2020  9:25 AM      Failed - LDL in normal range and within 360 days    Ldl Cholesterol, Calc  Date Value Ref Range Status  07/02/2012 4 0 - 100 mg/dL Final   LDL Chol Calc (NIH)  Date Value Ref Range Status  02/07/2020 47 0 - 99 mg/dL Final         Passed - Cr in normal range and within 360 days    Creatinine  Date Value Ref Range Status  07/03/2012 0.86 0.60 - 1.30 mg/dL Final   Creatinine, Ser  Date Value Ref Range Status  02/07/2020 0.99 0.76 - 1.27 mg/dL Final         Passed - HBA1C is between 0 and 7.9 and within 180 days    Hemoglobin A1C  Date Value Ref Range Status  12/22/2015 7.3  Final   HB A1C (BAYER DCA - WAIVED)  Date Value Ref Range Status  02/07/2020 6.4 <7.0 % Final    Comment:                                          Diabetic Adult            <7.0                                       Healthy Adult        4.3 - 5.7                                                           (DCCT/NGSP) American Diabetes Association's Summary of Glycemic Recommendations for Adults with Diabetes: Hemoglobin A1c <7.0%. More stringent glycemic goals (A1c <6.0%) may further reduce complications at the cost of increased risk of hypoglycemia.          Passed - eGFR in normal range and within 360 days    EGFR (African American)  Date Value Ref Range Status  07/03/2012 >60  Final   GFR calc Af Amer  Date Value Ref Range Status  02/07/2020 92 >59 mL/min/1.73 Final    Comment:    **[facility] currently reports eGFR in compliance with the current**   recommendations of the National [employer]. [facility] will   update reporting as new guidelines are published from the NKF-ASN   Task  force.    EGFR (Non-African Amer.)  Date Value Ref Range Status  07/03/2012 >60  Final    Comment:    eGFR values <60mL/min/1.73 m2 may be an indication of chronic kidney disease (CKD). Calculated eGFR is useful in patients with stable renal function. The eGFR calculation will not be reliable in acutely ill patients when serum creatinine is changing rapidly. It is not useful in  patients on dialysis. The eGFR calculation may not be applicable to   patients at the low and high extremes of body sizes, pregnant women, and vegetarians.    GFR calc non Af Amer  Date Value Ref Range Status  02/07/2020 80 >59 mL/min/1.73 Final         Passed - Valid encounter within last 6 months    Recent Outpatient Visits          Today Visit for wound check   [facility] Martinez, Jessica A, NP   1 week ago Annual physical exam   [facility] Martinez, Jessica A, NP   1 month ago Acute cystitis with hematuria   [facility] Martinez, Jessica A, NP   6 months ago Atherosclerosis of native coronary artery without angina pectoris, unspecified whether native or transplanted heart   [facility], Rachel Elizabeth, PA-C   12 months ago PE (physical exam), annual   [facility] [address], Rachel Elizabeth, PA-C      Future Appointments            In 3 weeks Martinez, Jessica A, NP [facility]            

## 2020-02-20 ENCOUNTER — Other Ambulatory Visit: Payer: Self-pay | Admitting: Nurse Practitioner

## 2020-02-20 ENCOUNTER — Encounter: Payer: BLUE CROSS/BLUE SHIELD | Admitting: Family Medicine

## 2020-02-20 ENCOUNTER — Other Ambulatory Visit: Payer: Self-pay

## 2020-02-20 ENCOUNTER — Telehealth: Payer: Self-pay

## 2020-02-20 DIAGNOSIS — I10 Essential (primary) hypertension: Secondary | ICD-10-CM

## 2020-02-20 DIAGNOSIS — E119 Type 2 diabetes mellitus without complications: Secondary | ICD-10-CM

## 2020-02-20 NOTE — Telephone Encounter (Signed)
Call pt left VM that some of his medications was sent iin on 02/17/2020. Told pt to call back and tell me if he still needs any more Rfs sent in.  KP

## 2020-02-20 NOTE — Telephone Encounter (Signed)
Pt would like a refill on all medication and all sent to [facility].

## 2020-02-24 ENCOUNTER — Other Ambulatory Visit: Payer: Self-pay | Admitting: Family Medicine

## 2020-02-24 ENCOUNTER — Encounter: Payer: Self-pay | Admitting: Nurse Practitioner

## 2020-03-09 ENCOUNTER — Other Ambulatory Visit: Payer: Self-pay

## 2020-03-09 ENCOUNTER — Encounter: Payer: Self-pay | Admitting: Nurse Practitioner

## 2020-03-09 ENCOUNTER — Ambulatory Visit (INDEPENDENT_AMBULATORY_CARE_PROVIDER_SITE_OTHER): Payer: Medicare Other | Admitting: Nurse Practitioner

## 2020-03-09 VITALS — BP 133/72 | HR 54 | Temp 98.2°F | Wt 142.4 lb

## 2020-03-09 DIAGNOSIS — E11621 Type 2 diabetes mellitus with foot ulcer: Secondary | ICD-10-CM

## 2020-03-09 DIAGNOSIS — E1169 Type 2 diabetes mellitus with other specified complication: Secondary | ICD-10-CM

## 2020-03-09 DIAGNOSIS — L97429 Non-pressure chronic ulcer of left heel and midfoot with unspecified severity: Secondary | ICD-10-CM | POA: Diagnosis not present

## 2020-03-09 MED ORDER — GABAPENTIN 100 MG PO CAPS: 100.0000 mg | ORAL_CAPSULE | Freq: Every day | ORAL | 1 refills | Status: DC

## 2020-03-09 MED ORDER — MUPIROCIN 2 % EX OINT: 1.0000 "application " | TOPICAL_OINTMENT | Freq: Two times a day (BID) | CUTANEOUS | 1 refills | Status: DC

## 2020-03-09 NOTE — Patient Instructions (Signed)

## 2020-03-09 NOTE — Assessment & Plan Note (Signed)
Acute, ongoing.  Appears to be nonhealing at this point and with some eschar noted.  Last podiatrist appointment was about 1 month ago-we will reach out to podiatrist and schedule an appointment, may need further debridement.  Continue with Bactroban ointment-oral antibiotics not indicated at this time.  Follow-up in 4 weeks.

## 2020-03-09 NOTE — Progress Notes (Signed)
BP 133/72   Pulse (!) 54   Temp 98.2 F (36.8 C) (Oral)   Wt 142 lb 6.4 oz (64.6 kg)   SpO2 96%   BMI 22.98 kg/m    Subjective:    Patient ID: Ivan Larsen, male    DOB: 06/19/1955, 64 y.o.   MRN: [id number]  HPI: Ivan Larsen is a 64 y.o. male presenting for wound check.  Chief Complaint  Patient presents with  . Wound Check    L heel, pt states the first wound is better, went to podiatry. Think he has another place coming up on the same heel now    SKIN INFECTION Duration: months Location: left heel History of trauma in area: known diabetic ulcer x about 1 month; had I&D with Dr. Logan Bores about 1 month ago Pain: yes Quality: burning Severity: moderate - severe Redness: yes Swelling: no Oozing: no Pus: no Fevers: no Nausea/vomiting: no Status: stable Treatments attempted: bactroban ointment, saw Podiatrist in past and had I&D Tetanus: UTD  Allergies  Allergen Reactions  . Iodinated Diagnostic Agents Nausea Only    Other reaction(s): Vomiting  . Iodine   . Shellfish Allergy Nausea And Vomiting and Other (See Comments)    Sweating  Sweating    . Shellfish-Derived Products Nausea And Vomiting and Other (See Comments)    Other reaction(s): Nausea And Vomiting, Other (See Comments) Sweating  Sweating    Outpatient Encounter Medications as of 03/09/2020  Medication Sig  . amLODipine (NORVASC) 10 MG tablet Take 1 tablet (10 mg total) by mouth daily.  Marland Kitchen atorvastatin (LIPITOR) 40 MG tablet Take 1 tablet (40 mg total) by mouth daily.  . benazepril (LOTENSIN) 40 MG tablet Take 1 tablet (40 mg total) by mouth daily.  . clopidogrel (PLAVIX) 75 MG tablet Take 1 tablet (75 mg total) by mouth daily.  . hydrochlorothiazide (HYDRODIURIL) 25 MG tablet Take 1 tablet (25 mg total) by mouth daily.  Marland Kitchen JANUVIA 25 MG tablet Take 1 tablet (25 mg total) by mouth daily.  Marland Kitchen JARDIANCE 25 MG TABS tablet Take 25 mg by mouth daily before breakfast. Needs appt for further refills    . meclizine (ANTIVERT) 25 MG tablet Take 1 tablet (25 mg total) by mouth 3 (three) times daily as needed for dizziness or nausea.  . metFORMIN (GLUCOPHAGE) 500 MG tablet Take 2 tablets (1,000 mg total) by mouth 2 (two) times daily with a meal.  . metoprolol tartrate (LOPRESSOR) 50 MG tablet Take 1 tablet (50 mg total) by mouth 2 (two) times daily.  . mupirocin ointment (BACTROBAN) 2 % Place 1 application into the nose 2 (two) times daily.  . Omega-3 Fatty Acids (FISH OIL OMEGA-3) 1000 MG CAPS Take by mouth 2 (two) times daily.  . ondansetron (ZOFRAN-ODT) 4 MG disintegrating tablet 1 TABLET ORALLY DISSOLVED EVERY 8 HOURS IF NEEDED FOR NAUSEA AND VOMITING  . tamsulosin (FLOMAX) 0.4 MG CAPS capsule Take 1 capsule (0.4 mg total) by mouth at bedtime. Take at nighttime to minimize dizziness/hypotension  . [DISCONTINUED] mupirocin ointment (BACTROBAN) 2 % Place 1 application into the nose 2 (two) times daily.  Marland Kitchen gabapentin (NEURONTIN) 100 MG capsule Take 1 capsule (100 mg total) by mouth at bedtime.  . [DISCONTINUED] sulfamethoxazole-trimethoprim (BACTRIM DS) 800-160 MG tablet Take 1 tablet by mouth 2 (two) times daily.   No facility-administered encounter medications on file as of 03/09/2020.   Patient Active Problem List   Diagnosis Date Noted  . Diabetic ulcer of left heel  associated with type 2 diabetes mellitus (HCC) 02/07/2020  . Urinary urgency 12/31/2019  . Acute cystitis with hematuria 12/31/2019  . Aortic ectasia, abdominal (HCC) 10/11/2016  . Benign essential hypertension 09/29/2014  . Hyperlipidemia, mixed 04/08/2014  . Atherosclerotic heart disease of native coronary artery without angina pectoris 03/24/2013  . GERD (gastroesophageal reflux disease) 03/24/2013  . Stented coronary artery 03/24/2013  . Diabetes mellitus associated with hormonal etiology (HCC) 01/29/2013  . Cataracts, bilateral 01/29/2013  . Cortical cataract of both eyes 01/29/2013  . PSC (posterior subcapsular  cataract), bilateral 01/29/2013   Past Medical History:  Diagnosis Date  . CAD (coronary artery disease)   . Diabetes mellitus without complication (HCC)   . Elevated liver enzymes   . History of kidney stones   . Hyperlipidemia   . MI (myocardial infarction) (HCC) 07/01/12  . PAD (peripheral artery disease) (HCC)    Relevant past medical, surgical, family and social history reviewed and updated as indicated. Interim medical history since our last visit reviewed.  Review of Systems  Constitutional: Negative.  Negative for activity change, appetite change, chills, diaphoresis, fatigue and fever.  Gastrointestinal: Negative.  Negative for nausea and vomiting.  Musculoskeletal: Negative for arthralgias, back pain, gait problem, joint swelling and myalgias.       + heel pain  Skin: Positive for color change and wound. Negative for pallor and rash.  Neurological: Negative.   Psychiatric/Behavioral: Negative.     Per HPI unless specifically indicated above     Objective:    BP 133/72   Pulse (!) 54   Temp 98.2 F (36.8 C) (Oral)   Wt 142 lb 6.4 oz (64.6 kg)   SpO2 96%   BMI 22.98 kg/m   Wt Readings from Last 3 Encounters:  03/09/20 142 lb 6.4 oz (64.6 kg)  02/17/20 149 lb (67.6 kg)  02/07/20 149 lb 12.8 oz (67.9 kg)    Physical Exam Vitals and nursing note reviewed.  Constitutional:      General: He is not in acute distress.    Appearance: Normal appearance. He is not toxic-appearing.  Abdominal:     General: Abdomen is flat. There is no distension.     Palpations: Abdomen is soft.  Skin:    Coloration: Skin is not jaundiced or pale.     Findings: No erythema.       Neurological:     Mental Status: He is alert and oriented to person, place, and time.     Motor: No weakness.     Gait: Gait normal.  Psychiatric:        Mood and Affect: Mood normal.        Behavior: Behavior normal.        Thought Content: Thought content normal.        Judgment: Judgment normal.         Assessment & Plan:   Problem List Items Addressed This Visit      Endocrine   Diabetes mellitus associated with hormonal etiology (HCC) - Primary    Chronic, ongoing.  Due for A1c recheck in December.  Given significant burning with diabetic ulcer, will start trial of gabapentin 100 mg.  If patient does not notice difference, can increase to up to 300 mg before next visit in 4 weeks.  Encouraged patient to take every night for maximum benefit.  Follow-up in 4 weeks.      Diabetic ulcer of left heel associated with type 2 diabetes mellitus (HCC)  Acute, ongoing.  Appears to be nonhealing at this point and with some eschar noted.  Last podiatrist appointment was about 1 month ago-we will reach out to podiatrist and schedule an appointment, may need further debridement.  Continue with Bactroban ointment-oral antibiotics not indicated at this time.  Follow-up in 4 weeks.      Relevant Medications   mupirocin ointment (BACTROBAN) 2 %       Follow up plan: Return in about 4 weeks (around 04/06/2020) for wound check and gabapentin f/u.

## 2020-03-09 NOTE — Assessment & Plan Note (Signed)
Chronic, ongoing.  Due for A1c recheck in December.  Given significant burning with diabetic ulcer, will start trial of gabapentin 100 mg.  If patient does not notice difference, can increase to up to 300 mg before next visit in 4 weeks.  Encouraged patient to take every night for maximum benefit.  Follow-up in 4 weeks.

## 2020-03-10 ENCOUNTER — Ambulatory Visit: Payer: Medicare Other | Admitting: Podiatry

## 2020-03-11 ENCOUNTER — Other Ambulatory Visit: Payer: Self-pay

## 2020-03-11 ENCOUNTER — Emergency Department
Admission: EM | Admit: 2020-03-11 | Discharge: 2020-03-11 | Disposition: A | Discharge status: Home / Self Care | Payer: Medicare Other | Attending: Student in an Organized Health Care Education/Training Program | Admitting: Student in an Organized Health Care Education/Training Program

## 2020-03-11 ENCOUNTER — Encounter: Payer: Self-pay | Admitting: Emergency Medicine

## 2020-03-11 DIAGNOSIS — Z5321 Procedure and treatment not carried out due to patient leaving prior to being seen by health care provider: Secondary | ICD-10-CM | POA: Insufficient documentation

## 2020-03-11 DIAGNOSIS — R112 Nausea with vomiting, unspecified: Secondary | ICD-10-CM | POA: Diagnosis not present

## 2020-03-11 DIAGNOSIS — R1111 Vomiting without nausea: Secondary | ICD-10-CM | POA: Diagnosis not present

## 2020-03-11 DIAGNOSIS — I10 Essential (primary) hypertension: Secondary | ICD-10-CM | POA: Diagnosis not present

## 2020-03-11 DIAGNOSIS — K219 Gastro-esophageal reflux disease without esophagitis: Secondary | ICD-10-CM | POA: Insufficient documentation

## 2020-03-11 DIAGNOSIS — R110 Nausea: Secondary | ICD-10-CM | POA: Diagnosis not present

## 2020-03-11 DIAGNOSIS — R0989 Other specified symptoms and signs involving the circulatory and respiratory systems: Secondary | ICD-10-CM | POA: Diagnosis not present

## 2020-03-11 LAB — COMPREHENSIVE METABOLIC PANEL
ALT: 30 U/L (ref 0–44)
AST: 30 U/L (ref 15–41)
Albumin: 5 g/dL (ref 3.5–5.0)
Alkaline Phosphatase: 45 U/L (ref 38–126)
Anion gap: 17 — ABNORMAL HIGH (ref 5–15)
BUN: 28 mg/dL — ABNORMAL HIGH (ref 8–23)
CO2: 28 mmol/L (ref 22–32)
Calcium: 9.6 mg/dL (ref 8.9–10.3)
Chloride: 94 mmol/L — ABNORMAL LOW (ref 98–111)
Creatinine, Ser: 0.85 mg/dL (ref 0.61–1.24)
GFR, Estimated: >60 mL/min (ref >60)
Glucose, Bld: 184 mg/dL — ABNORMAL HIGH (ref 70–99)
Potassium: 3.4 mmol/L — ABNORMAL LOW (ref 3.5–5.1)
Sodium: 139 mmol/L (ref 135–145)
Total Bilirubin: 1.2 mg/dL (ref 0.3–1.2)
Total Protein: 7.9 g/dL (ref 6.5–8.1)

## 2020-03-11 LAB — CBC
HCT: 40.4 % (ref 39.0–52.0)
Hemoglobin: 14.1 g/dL (ref 13.0–17.0)
MCH: 30.4 pg (ref 26.0–34.0)
MCHC: 34.9 g/dL (ref 30.0–36.0)
MCV: 87.1 fL (ref 80.0–100.0)
Platelets: 248 10*3/uL (ref 150–400)
RBC: 4.64 MIL/uL (ref 4.22–5.81)
RDW: 14.6 % (ref 11.5–15.5)
WBC: 18.1 10*3/uL — ABNORMAL HIGH (ref 4.0–10.5)
nRBC: 0 % (ref 0.0–0.2)

## 2020-03-11 LAB — URINALYSIS, COMPLETE (UACMP) WITH MICROSCOPIC
Bacteria, UA: NONE SEEN
Bilirubin Urine: NEGATIVE
Glucose, UA: >=500 mg/dL — AB
Hgb urine dipstick: NEGATIVE
Ketones, ur: 20 mg/dL — AB
Leukocytes,Ua: NEGATIVE
Nitrite: NEGATIVE
Protein, ur: 30 mg/dL — AB
Specific Gravity, Urine: 1.025 (ref 1.005–1.030)
pH: 6 (ref 5.0–8.0)

## 2020-03-11 LAB — TROPONIN I (HIGH SENSITIVITY): Troponin I (High Sensitivity): 11 ng/L (ref <18)

## 2020-03-11 LAB — LIPASE, BLOOD: Lipase: 28 U/L (ref 11–51)

## 2020-03-11 NOTE — ED Notes (Signed)
Pt requesting something to drink; pt notified of need to remain NPO until eval by the provider due to his specific c/o vomiting; pt st he is going to get himself something to drink now despite instructions

## 2020-03-11 NOTE — ED Triage Notes (Signed)
Pt comes into the ED via EMS from home with c/o nausea today, states he had an episode 2 days ago . #[id number], 4mg  zofran and 300NS infused

## 2020-03-11 NOTE — ED Triage Notes (Signed)
Pt comes into the ED via [facility] from home c/o emesis and GERD feeling.  Pt states that it started initially on Monday but went away until lunch today.  Pt states he has no actual abdominal pain, but there is a pressure and burning sensation and that is when he needs to vomit.  Pt in NAD at this time with even and unlabored respirations and is neurologically intact.

## 2020-03-12 ENCOUNTER — Telehealth: Payer: Self-pay | Admitting: Emergency Medicine

## 2020-03-12 NOTE — Telephone Encounter (Signed)
Called patient due to left emergency department before provider exam to inquire about condition and follow up plans. Left message. 

## 2020-03-20 ENCOUNTER — Other Ambulatory Visit: Payer: Self-pay

## 2020-03-20 DIAGNOSIS — I10 Essential (primary) hypertension: Secondary | ICD-10-CM

## 2020-03-20 DIAGNOSIS — E119 Type 2 diabetes mellitus without complications: Secondary | ICD-10-CM

## 2020-03-20 MED ORDER — TAMSULOSIN HCL 0.4 MG PO CAPS: 0.4000 mg | ORAL_CAPSULE | Freq: Every day | ORAL | 1 refills | Status: AC

## 2020-03-20 MED ORDER — HYDROCHLOROTHIAZIDE 25 MG PO TABS: 25.0000 mg | ORAL_TABLET | Freq: Every day | ORAL | 1 refills | Status: AC

## 2020-03-20 MED ORDER — EMPAGLIFLOZIN 25 MG PO TABS: 25.0000 mg | ORAL_TABLET | Freq: Every day | ORAL | 1 refills | Status: AC

## 2020-03-20 MED ORDER — METFORMIN HCL 500 MG PO TABS
1000.0000 mg | ORAL_TABLET | Freq: Two times a day (BID) | ORAL | 1 refills | Status: AC
Start: 2020-03-20 — End: ?

## 2020-03-20 MED ORDER — SITAGLIPTIN PHOSPHATE 25 MG PO TABS: 25.0000 mg | ORAL_TABLET | Freq: Every day | ORAL | 1 refills | Status: AC

## 2020-03-20 NOTE — Telephone Encounter (Signed)
Reviewed medications, not all are due. Marked meds that are due. Patient had CPE 02/07/20.

## 2020-03-20 NOTE — Telephone Encounter (Signed)
Pt presented in office stating that he needs refills on his medicine. He didn't know which ones but said that he takes a lot. Pt states that he was told that he would get them at his last appt. Last appt was for a wound check last cpe in sept. Please advise

## 2020-04-06 ENCOUNTER — Ambulatory Visit: Payer: Medicare Other | Admitting: Nurse Practitioner

## 2020-04-06 DIAGNOSIS — I10 Essential (primary) hypertension: Secondary | ICD-10-CM | POA: Diagnosis not present

## 2020-04-06 DIAGNOSIS — I2510 Atherosclerotic heart disease of native coronary artery without angina pectoris: Secondary | ICD-10-CM | POA: Diagnosis not present

## 2020-04-06 DIAGNOSIS — E782 Mixed hyperlipidemia: Secondary | ICD-10-CM | POA: Diagnosis not present

## 2020-04-06 DIAGNOSIS — I70219 Atherosclerosis of native arteries of extremities with intermittent claudication, unspecified extremity: Secondary | ICD-10-CM | POA: Diagnosis not present

## 2020-04-14 ENCOUNTER — Encounter: Payer: Self-pay | Admitting: Nurse Practitioner

## 2020-04-14 ENCOUNTER — Other Ambulatory Visit: Payer: Self-pay

## 2020-04-14 ENCOUNTER — Ambulatory Visit (INDEPENDENT_AMBULATORY_CARE_PROVIDER_SITE_OTHER): Payer: Medicare Other | Admitting: Nurse Practitioner

## 2020-04-14 VITALS — BP 124/71 | HR 60 | Temp 98.0°F | Ht 65.35 in | Wt 142.1 lb

## 2020-04-14 DIAGNOSIS — E11621 Type 2 diabetes mellitus with foot ulcer: Secondary | ICD-10-CM

## 2020-04-14 DIAGNOSIS — E1140 Type 2 diabetes mellitus with diabetic neuropathy, unspecified: Secondary | ICD-10-CM | POA: Diagnosis not present

## 2020-04-14 DIAGNOSIS — L97429 Non-pressure chronic ulcer of left heel and midfoot with unspecified severity: Secondary | ICD-10-CM | POA: Diagnosis not present

## 2020-04-14 MED ORDER — COLLAGENASE 250 UNIT/GM EX OINT: 1.0000 "application " | TOPICAL_OINTMENT | Freq: Every day | CUTANEOUS | 0 refills | Status: DC

## 2020-04-14 MED ORDER — GABAPENTIN 300 MG PO CAPS: 300.0000 mg | ORAL_CAPSULE | Freq: Every day | ORAL | 1 refills | Status: DC

## 2020-04-14 NOTE — Assessment & Plan Note (Signed)
Acute, ongoing.  Did not make it to podiatry appointment.  Strongly encourage patient to follow-up with podiatrist for ongoing diabetic foot care.  In meantime, will start with twice daily debridement ointment to eschar.  Discussed wound care at length-patient to clean wound with only mild soap and water (NOT hydrogen peroxide) and apply debridement ointment after and cover with gauze and tape.  He is to change his wound twice daily and follow-up in 2 weeks.

## 2020-04-14 NOTE — Progress Notes (Signed)
BP 124/71   Pulse 60   Temp 98 F (36.7 C)   Ht 5' 5.35" (1.66 m)   Wt 142 lb 2 oz (64.5 kg)   SpO2 97%   BMI 23.40 kg/m    Subjective:    Patient ID: Ivan Larsen, male    DOB: 17-Dec-1955, 64 y.o.   MRN: [id number]  HPI: Ivan Larsen is a 64 y.o. male presenting for wound check and gabapentin follow up.  Chief Complaint  Patient presents with  . Wound Check    left heel   WOUND CHECK Reports wound on his left heel has been improving.  He did not make it back to his podiatrist.  Has been putting hydrogen peroxide and nonprescription ointment on it daily and thinks it is getting smaller.  Denies fevers, nausea, vomiting, changes in bowel movements.  NEUROPATHY Took gabapentin 100 mg, has run out, but thinks that it was slightly helping with some of the nerve pain. Neuropathy status: uncontrolled  Satisfied with current treatment?: no Medication side effects: no Medication compliance:  excellent compliance Location: bottoms of feet Pain: yes Severity: moderate  Quality:  Frequency: intermittent Bilateral: yes Symmetric: yes Numbness: yes Decreased sensation: yes  Tingling: yes; sharp shooting pain at random times Weakness: no Context: stable   Allergies  Allergen Reactions  . Iodinated Diagnostic Agents Nausea Only    Other reaction(s): Vomiting  . Iodine   . Shellfish Allergy Nausea And Vomiting and Other (See Comments)    Sweating  Sweating    . Shellfish-Derived Products Nausea And Vomiting and Other (See Comments)    Other reaction(s): Nausea And Vomiting, Other (See Comments) Sweating  Sweating    Outpatient Encounter Medications as of 04/14/2020  Medication Sig  . amLODipine (NORVASC) 10 MG tablet Take 1 tablet (10 mg total) by mouth daily.  Marland Kitchen atorvastatin (LIPITOR) 40 MG tablet Take 1 tablet (40 mg total) by mouth daily.  . benazepril (LOTENSIN) 40 MG tablet Take 1 tablet (40 mg total) by mouth daily.  . clopidogrel (PLAVIX) 75 MG  tablet Take 1 tablet (75 mg total) by mouth daily.  . collagenase (SANTYL) ointment Apply 1 application topically daily.  . empagliflozin (JARDIANCE) 25 MG TABS tablet Take 1 tablet (25 mg total) by mouth daily before breakfast.  . gabapentin (NEURONTIN) 300 MG capsule Take 1 capsule (300 mg total) by mouth at bedtime.  . hydrochlorothiazide (HYDRODIURIL) 25 MG tablet Take 1 tablet (25 mg total) by mouth daily.  . meclizine (ANTIVERT) 25 MG tablet Take 1 tablet (25 mg total) by mouth 3 (three) times daily as needed for dizziness or nausea.  . metFORMIN (GLUCOPHAGE) 500 MG tablet Take 2 tablets (1,000 mg total) by mouth 2 (two) times daily with a meal.  . metoprolol tartrate (LOPRESSOR) 50 MG tablet Take 1 tablet (50 mg total) by mouth 2 (two) times daily.  . mupirocin ointment (BACTROBAN) 2 % Place 1 application into the nose 2 (two) times daily.  . Omega-3 Fatty Acids (FISH OIL OMEGA-3) 1000 MG CAPS Take by mouth 2 (two) times daily.  . ondansetron (ZOFRAN-ODT) 4 MG disintegrating tablet 1 TABLET ORALLY DISSOLVED EVERY 8 HOURS IF NEEDED FOR NAUSEA AND VOMITING  . sitaGLIPtin (JANUVIA) 25 MG tablet Take 1 tablet (25 mg total) by mouth daily.  . tamsulosin (FLOMAX) 0.4 MG CAPS capsule Take 1 capsule (0.4 mg total) by mouth at bedtime. Take at nighttime to minimize dizziness/hypotension  . [DISCONTINUED] gabapentin (NEURONTIN) 100 MG capsule Take  1 capsule (100 mg total) by mouth at bedtime.   No facility-administered encounter medications on file as of 04/14/2020.   Patient Active Problem List   Diagnosis Date Noted  . Type 2 diabetes mellitus with diabetic neuropathy, without long-term current use of insulin (HCC) 04/14/2020  . Diabetic ulcer of left heel associated with type 2 diabetes mellitus (HCC) 02/07/2020  . Urinary urgency 12/31/2019  . Acute cystitis with hematuria 12/31/2019  . Aortic ectasia, abdominal (HCC) 10/11/2016  . Benign essential hypertension 09/29/2014  . Hyperlipidemia,  mixed 04/08/2014  . Atherosclerotic heart disease of native coronary artery without angina pectoris 03/24/2013  . GERD (gastroesophageal reflux disease) 03/24/2013  . Stented coronary artery 03/24/2013  . Diabetes mellitus associated with hormonal etiology (HCC) 01/29/2013  . Cataracts, bilateral 01/29/2013  . Cortical cataract of both eyes 01/29/2013  . PSC (posterior subcapsular cataract), bilateral 01/29/2013   Past Medical History:  Diagnosis Date  . CAD (coronary artery disease)   . Diabetes mellitus without complication (HCC)   . Elevated liver enzymes   . History of kidney stones   . Hyperlipidemia   . MI (myocardial infarction) (HCC) 07/01/12  . PAD (peripheral artery disease) (HCC)    Relevant past medical, surgical, family and social history reviewed and updated as indicated. Interim medical history since our last visit reviewed.  Review of Systems  Constitutional: Negative.  Negative for activity change, appetite change, fatigue and fever.  Gastrointestinal: Negative.   Musculoskeletal: Negative.   Skin: Positive for wound (Left heel). Negative for color change and pallor.  Neurological: Positive for numbness. Negative for dizziness, weakness, light-headedness and headaches.  Psychiatric/Behavioral: Negative.     Per HPI unless specifically indicated above     Objective:    BP 124/71   Pulse 60   Temp 98 F (36.7 C)   Ht 5' 5.35" (1.66 m)   Wt 142 lb 2 oz (64.5 kg)   SpO2 97%   BMI 23.40 kg/m   Wt Readings from Last 3 Encounters:  04/14/20 142 lb 2 oz (64.5 kg)  03/11/20 142 lb (64.4 kg)  03/09/20 142 lb 6.4 oz (64.6 kg)    Physical Exam Vitals and nursing note reviewed.  Constitutional:      General: He is not in acute distress.    Appearance: Normal appearance. He is not toxic-appearing.  Pulmonary:     Effort: Pulmonary effort is normal. No respiratory distress.  Skin:    General: Skin is warm and dry.     Coloration: Skin is not jaundiced or  pale.     Findings: Wound present. No erythema.     Comments: 0.5 cm x 0.5 cm circular wound noted to left heel.  Wound base thick and black with no exudate or odor.  Mild erythema surrounding wound without fluctuance.  Neurological:     General: No focal deficit present.     Mental Status: He is alert and oriented to person, place, and time.     Motor: No weakness.     Gait: Gait normal.  Psychiatric:        Mood and Affect: Mood normal.        Behavior: Behavior normal.        Thought Content: Thought content normal.        Judgment: Judgment normal.        Assessment & Plan:   Problem List Items Addressed This Visit      Endocrine   Diabetic ulcer of left heel  associated with type 2 diabetes mellitus (HCC) - Primary    Acute, ongoing.  Did not make it to podiatry appointment.  Strongly encourage patient to follow-up with podiatrist for ongoing diabetic foot care.  In meantime, will start with twice daily debridement ointment to eschar.  Discussed wound care at length-patient to clean wound with only mild soap and water (NOT hydrogen peroxide) and apply debridement ointment after and cover with gauze and tape.  He is to change his wound twice daily and follow-up in 2 weeks.      Type 2 diabetes mellitus with diabetic neuropathy, without long-term current use of insulin (HCC)    Chronic, ongoing.  With some benefit from gabapentin 100 mg at nighttime.  Will increase to gabapentin 300 mg at nighttime and reassess neuropathy at follow-up in 2 weeks.          Follow up plan: Return in about 2 weeks (around 04/28/2020) for wound check.

## 2020-04-14 NOTE — Patient Instructions (Signed)
Collagenase ointment What is this medicine? COLLAGENASE (kohl LAH jen ace) is an enzyme that breaks down collagen in damaged tissue and helps healthy tissue to grow. It may help wounds heal faster. This medicine may be used for other purposes; ask your health care provider or pharmacist if you have questions. COMMON BRAND NAME(S): Santyl What should I tell my health care provider before I take this medicine? They need to know if you have any of these conditions:  an unusual or allergic reaction to collagenase, other medicines, foods, dyes, or preservatives  pregnant or trying to get pregnant  breast-feeding How should I use this medicine? This medicine is for external use only. Do not take by mouth. Wash the wound as directed by your health care professional. Do not scrub. If you are applying a topical antibiotic to the wound, apply the antibiotic before this medicine. Do not touch the tip of the ointment tube with any surface, especially your fingers or the wound. Try to only get the ointment on the wound itself. If some gets on normal skin, wipe away with a sterile gauze pad. Do not use your medicine more often than directed. Talk to your pediatrician regarding the use of this medicine in children. Special care may be needed. Overdosage: If you think you have taken too much of this medicine contact a poison control center or emergency room at once. NOTE: This medicine is only for you. Do not share this medicine with others. What if I miss a dose? If you miss a dose, use it as soon as you can. If it is almost time for your next dose, use only that dose. Do not use double or extra doses. What may interact with this medicine? Do not take this medicine with any of the following medications:  aluminum acetate, Burow's solution  povidone iodine  silver nitrate  silver sulfadiazine This list may not describe all possible interactions. Give your health care provider a list of all the  medicines, herbs, non-prescription drugs, or dietary supplements you use. Also tell them if you smoke, drink alcohol, or use illegal drugs. Some items may interact with your medicine. What should I watch for while using this medicine? Visit your doctor or health care professional for regular checks on your progress. If your wound begins to look worse, smell bad, has colored discharge or more discharge, or increases in size, contact your health care professional. You may have an infection or need a change in your treatment. If you develop a fever, chills, low blood pressure, dizziness, rapid heartbeat, or confusion, contact your health care professional immediately. You may have an infection of your blood. What side effects may I notice from receiving this medicine? Side effects that you should report to your doctor or health care professional as soon as possible:  breathing problems  skin rash or hives Side effects that usually do not require medical attention (report to your doctor or health care professional if they continue or are bothersome):  redness at the application site This list may not describe all possible side effects. Call your doctor for medical advice about side effects. You may report side effects to FDA at 1-800-FDA-1088. Where should I keep my medicine? Keep out of the reach of children. Store below 25 degrees C (77 degrees F). Throw away any unused medication after the expiration date. NOTE: This sheet is a summary. It may not cover all possible information. If you have questions about this medicine, talk to your doctor,   pharmacist, or health care provider.  2020 Elsevier/Gold Standard (2007-08-14 16:39:42)  

## 2020-04-14 NOTE — Assessment & Plan Note (Signed)
Chronic, ongoing.  With some benefit from gabapentin 100 mg at nighttime.  Will increase to gabapentin 300 mg at nighttime and reassess neuropathy at follow-up in 2 weeks.

## 2020-05-01 ENCOUNTER — Encounter: Payer: Self-pay | Admitting: Nurse Practitioner

## 2020-05-05 ENCOUNTER — Ambulatory Visit (INDEPENDENT_AMBULATORY_CARE_PROVIDER_SITE_OTHER): Payer: Medicare Other | Admitting: Nurse Practitioner

## 2020-05-05 ENCOUNTER — Encounter: Payer: Self-pay | Admitting: Nurse Practitioner

## 2020-05-05 ENCOUNTER — Other Ambulatory Visit: Payer: Self-pay

## 2020-05-05 VITALS — BP 148/72 | HR 72 | Temp 98.0°F | Ht 65.24 in | Wt 143.8 lb

## 2020-05-05 DIAGNOSIS — L97421 Non-pressure chronic ulcer of left heel and midfoot limited to breakdown of skin: Secondary | ICD-10-CM | POA: Diagnosis not present

## 2020-05-05 DIAGNOSIS — E11621 Type 2 diabetes mellitus with foot ulcer: Secondary | ICD-10-CM

## 2020-05-05 MED ORDER — GABAPENTIN 300 MG PO CAPS: 300.0000 mg | ORAL_CAPSULE | Freq: Three times a day (TID) | ORAL | 4 refills | Status: AC

## 2020-05-05 MED ORDER — COLLAGENASE 250 UNIT/GM EX OINT: 1.0000 "application " | TOPICAL_OINTMENT | Freq: Every day | CUTANEOUS | 0 refills | Status: AC

## 2020-05-05 NOTE — Assessment & Plan Note (Addendum)
Acute, ongoing for 2 months.  Continue twice daily debridement ointment to eschar, Santyl.  Discussed wound care at length-patient to clean wound with only mild soap and water (NOT hydrogen peroxide) and apply debridement ointment after and cover with gauze and tape.  He is to change his wound twice daily and follow-up in 4 weeks at office.  Urgent referral placed to wound care due to ongoing presence of wound with deep eschar at wound bed, may benefit further debridement and alternate regimen.  He is aware to immediately notify provider if any erythema, warmth, drainage, or increase size presents.  Increase Gabapentin to 300 MG TID for discomfort.

## 2020-05-05 NOTE — Progress Notes (Signed)
BP (!) 148/72   Pulse 72   Temp 98 F (36.7 C) (Oral)   Ht 5' 5.24" (1.657 m)   Wt 143 lb 12.8 oz (65.2 kg)   SpO2 98%   BMI 23.76 kg/m    Subjective:    Patient ID: Ivan Larsen, male    DOB: 1955-06-16, 64 y.o.   MRN: [id number]  HPI: Ivan Larsen is a 64 y.o. male  Chief Complaint  Patient presents with  . Wound Check    Left foot., has been stinging and burning and keeping him up  at night   SKIN WOUND  Follow-up for ulcer left heel -- initially seen on 02/07/20.  Was seen in clinic for follow-up last on 04/14/20 for diabetic ulcer of left heel in presence of underlying T2DM.  Was to see podiatry, has not seen since 02/11/20 -- was to return but did not feel well.  Last visit the provider prescribed Santyl for debridement and recommend wound care at home.  Did have wound care referral, has not seen.  Does endorse discomfort to area, stinging and burning.  Taking Gabapentin 300 MG QHS.  Last A1c 6.4% in September. Duration: months Location: left heel History of trauma in area: no Pain: yes Quality: yes Severity: 8/10 Redness: no Swelling: no Oozing: no Pus: no Fevers: no Nausea/vomiting: no Status: fluctuating Treatments attempted:abx and Santyl Tetanus: UTD   Relevant past medical, surgical, family and social history reviewed and updated as indicated. Interim medical history since our last visit reviewed. Allergies and medications reviewed and updated.  Review of Systems  Constitutional: Negative for activity change, diaphoresis, fatigue and fever.  Respiratory: Negative for cough, chest tightness, shortness of breath and wheezing.   Cardiovascular: Negative for chest pain, palpitations and leg swelling.  Skin: Positive for wound.  Psychiatric/Behavioral: Negative.     Per HPI unless specifically indicated above     Objective:    BP (!) 148/72   Pulse 72   Temp 98 F (36.7 C) (Oral)   Ht 5' 5.24" (1.657 m)   Wt 143 lb 12.8 oz (65.2 kg)    SpO2 98%   BMI 23.76 kg/m   Wt Readings from Last 3 Encounters:  05/05/20 143 lb 12.8 oz (65.2 kg)  04/14/20 142 lb 2 oz (64.5 kg)  03/11/20 142 lb (64.4 kg)    Physical Exam Vitals and nursing note reviewed.  Constitutional:      General: He is awake. He is not in acute distress.    Appearance: He is well-developed and well-groomed. He is not ill-appearing.  HENT:     Head: Normocephalic and atraumatic.     Right Ear: Hearing, ear canal and external ear normal. No drainage.     Left Ear: Hearing, ear canal and external ear normal. No drainage.  Eyes:     General: Lids are normal.        Right eye: No discharge.        Left eye: No discharge.     Conjunctiva/sclera: Conjunctivae normal.     Pupils: Pupils are equal, round, and reactive to light.  Neck:     Vascular: No carotid bruit.     Trachea: Trachea normal.  Cardiovascular:     Rate and Rhythm: Normal rate and regular rhythm.     Pulses:          Dorsalis pedis pulses are 2+ on the right side and 2+ on the left side.  Posterior tibial pulses are 2+ on the right side and 2+ on the left side.     Heart sounds: Normal heart sounds, S1 normal and S2 normal. No murmur heard. No gallop.   Pulmonary:     Effort: Pulmonary effort is normal. No accessory muscle usage or respiratory distress.     Breath sounds: Normal breath sounds.  Abdominal:     General: Bowel sounds are normal.     Palpations: Abdomen is soft. There is no hepatomegaly or splenomegaly.  Musculoskeletal:        General: Normal range of motion.     Cervical back: Normal range of motion and neck supple.     Right lower leg: No edema.     Left lower leg: No edema.       Feet:  Feet:     Right foot:     Protective Sensation: 10 sites tested. 8 sites sensed.     Skin integrity: Skin integrity normal.     Toenail Condition: Right toenails are normal.     Left foot:     Protective Sensation: 10 sites tested. 8 sites sensed.     Skin integrity: Ulcer  present.     Toenail Condition: Left toenails are normal.  Skin:    General: Skin is warm and dry.     Capillary Refill: Capillary refill takes less than 2 seconds.  Neurological:     Mental Status: He is alert and oriented to person, place, and time.     Deep Tendon Reflexes: Reflexes are normal and symmetric.     Reflex Scores:      Brachioradialis reflexes are 2+ on the right side and 2+ on the left side.      Patellar reflexes are 2+ on the right side and 2+ on the left side. Psychiatric:        Attention and Perception: Attention normal.        Mood and Affect: Mood normal.        Speech: Speech normal.        Behavior: Behavior normal. Behavior is cooperative.        Thought Content: Thought content normal.     Results for orders placed or performed during the hospital encounter of 03/11/20  Lipase, blood  Result Value Ref Range   Lipase 28 11 - 51 U/L  Comprehensive metabolic panel  Result Value Ref Range   Sodium 139 135 - 145 mmol/L   Potassium 3.4 (L) 3.5 - 5.1 mmol/L   Chloride 94 (L) 98 - 111 mmol/L   CO2 28 22 - 32 mmol/L   Glucose, Bld 184 (H) 70 - 99 mg/dL   BUN 28 (H) 8 - 23 mg/dL   Creatinine, Ser 1.19 0.61 - 1.24 mg/dL   Calcium 9.6 8.9 - 41.7 mg/dL   Total Protein 7.9 6.5 - 8.1 g/dL   Albumin 5.0 3.5 - 5.0 g/dL   AST 30 15 - 41 U/L   ALT 30 0 - 44 U/L   Alkaline Phosphatase 45 38 - 126 U/L   Total Bilirubin 1.2 0.3 - 1.2 mg/dL   GFR, Estimated >40 >81 mL/min   Anion gap 17 (H) 5 - 15  CBC  Result Value Ref Range   WBC 18.1 (H) 4.0 - 10.5 K/uL   RBC 4.64 4.22 - 5.81 MIL/uL   Hemoglobin 14.1 13.0 - 17.0 g/dL   HCT 44.8 18.5 - 63.1 %   MCV 87.1 80.0 - 100.0 fL  MCH 30.4 26.0 - 34.0 pg   MCHC 34.9 30.0 - 36.0 g/dL   RDW 16.1 09.6 - 04.5 %   Platelets 248 150 - 400 K/uL   nRBC 0.0 0.0 - 0.2 %  Urinalysis, Complete w Microscopic  Result Value Ref Range   Color, Urine YELLOW (A) YELLOW   APPearance CLEAR (A) CLEAR   Specific Gravity, Urine 1.025  1.005 - 1.030   pH 6.0 5.0 - 8.0   Glucose, UA >=500 (A) NEGATIVE mg/dL   Hgb urine dipstick NEGATIVE NEGATIVE   Bilirubin Urine NEGATIVE NEGATIVE   Ketones, ur 20 (A) NEGATIVE mg/dL   Protein, ur 30 (A) NEGATIVE mg/dL   Nitrite NEGATIVE NEGATIVE   Leukocytes,Ua NEGATIVE NEGATIVE   RBC / HPF 0-5 0 - 5 RBC/hpf   WBC, UA 0-5 0 - 5 WBC/hpf   Bacteria, UA NONE SEEN NONE SEEN   Squamous Epithelial / LPF 0-5 0 - 5  Troponin I (High Sensitivity)  Result Value Ref Range   Troponin I (High Sensitivity) 11 <18 ng/L      Assessment & Plan:   Problem List Items Addressed This Visit      Endocrine   Diabetic ulcer of left heel associated with type 2 diabetes mellitus (HCC) - Primary    Acute, ongoing for 2 months.  Continue twice daily debridement ointment to eschar, Santyl.  Discussed wound care at length-patient to clean wound with only mild soap and water (NOT hydrogen peroxide) and apply debridement ointment after and cover with gauze and tape.  He is to change his wound twice daily and follow-up in 4 weeks at office.  Urgent referral placed to wound care due to ongoing presence of wound with deep eschar at wound bed, may benefit further debridement and alternate regimen.  He is aware to immediately notify provider if any erythema, warmth, drainage, or increase size presents.  Increase Gabapentin to 300 MG TID for discomfort.      Relevant Orders   Ambulatory referral to Wound Clinic       Follow up plan: Return in about 4 weeks (around 06/02/2020) for Diabetic follow-up and ulcer follow-up.

## 2020-05-05 NOTE — Patient Instructions (Signed)
Diabetes Mellitus and Foot Care Foot care is an important part of your health, especially when you have diabetes. Diabetes may cause you to have problems because of poor blood flow (circulation) to your feet and legs, which can cause your skin to:  Become thinner and drier.  Break more easily.  Heal more slowly.  Peel and crack. You may also have nerve damage (neuropathy) in your legs and feet, causing decreased feeling in them. This means that you may not notice minor injuries to your feet that could lead to more serious problems. Noticing and addressing any potential problems early is the best way to prevent future foot problems. How to care for your feet Foot hygiene  Wash your feet daily with warm water and mild soap. Do not use hot water. Then, pat your feet and the areas between your toes until they are completely dry. Do not soak your feet as this can dry your skin.  Trim your toenails straight across. Do not dig under them or around the cuticle. File the edges of your nails with an emery board or nail file.  Apply a moisturizing lotion or petroleum jelly to the skin on your feet and to dry, brittle toenails. Use lotion that does not contain alcohol and is unscented. Do not apply lotion between your toes. Shoes and socks  Wear clean socks or stockings every day. Make sure they are not too tight. Do not wear knee-high stockings since they may decrease blood flow to your legs.  Wear shoes that fit properly and have enough cushioning. Always look in your shoes before you put them on to be sure there are no objects inside.  To break in new shoes, wear them for just a few hours a day. This prevents injuries on your feet. Wounds, scrapes, corns, and calluses  Check your feet daily for blisters, cuts, bruises, sores, and redness. If you cannot see the bottom of your feet, use a mirror or ask someone for help.  Do not cut corns or calluses or try to remove them with medicine.  If you  find a minor scrape, cut, or break in the skin on your feet, keep it and the skin around it clean and dry. You may clean these areas with mild soap and water. Do not clean the area with peroxide, alcohol, or iodine.  If you have a wound, scrape, corn, or callus on your foot, look at it several times a day to make sure it is healing and not infected. Check for: ? Redness, swelling, or pain. ? Fluid or blood. ? Warmth. ? Pus or a bad smell. General instructions  Do not cross your legs. This may decrease blood flow to your feet.  Do not use heating pads or hot water bottles on your feet. They may burn your skin. If you have lost feeling in your feet or legs, you may not know this is happening until it is too late.  Protect your feet from hot and cold by wearing shoes, such as at the beach or on hot pavement.  Schedule a complete foot exam at least once a year (annually) or more often if you have foot problems. If you have foot problems, report any cuts, sores, or bruises to your health care provider immediately. Contact a health care provider if:  You have a medical condition that increases your risk of infection and you have any cuts, sores, or bruises on your feet.  You have an injury that is not   healing.  You have redness on your legs or feet.  You feel burning or tingling in your legs or feet.  You have pain or cramps in your legs and feet.  Your legs or feet are numb.  Your feet always feel cold.  You have pain around a toenail. Get help right away if:  You have a wound, scrape, corn, or callus on your foot and: ? You have pain, swelling, or redness that gets worse. ? You have fluid or blood coming from the wound, scrape, corn, or callus. ? Your wound, scrape, corn, or callus feels warm to the touch. ? You have pus or a bad smell coming from the wound, scrape, corn, or callus. ? You have a fever. ? You have a red line going up your leg. Summary  Check your feet every day  for cuts, sores, red spots, swelling, and blisters.  Moisturize feet and legs daily.  Wear shoes that fit properly and have enough cushioning.  If you have foot problems, report any cuts, sores, or bruises to your health care provider immediately.  Schedule a complete foot exam at least once a year (annually) or more often if you have foot problems. This information is not intended to replace advice given to you by your health care provider. Make sure you discuss any questions you have with your health care provider. Document Revised: 01/23/2019 Document Reviewed: 06/03/2016 Elsevier Patient Education  2020 [employer]  

## 2020-05-25 DIAGNOSIS — I2510 Atherosclerotic heart disease of native coronary artery without angina pectoris: Secondary | ICD-10-CM | POA: Diagnosis not present

## 2020-05-30 ENCOUNTER — Encounter: Payer: Self-pay | Admitting: Nurse Practitioner

## 2020-05-30 DIAGNOSIS — I739 Peripheral vascular disease, unspecified: Secondary | ICD-10-CM | POA: Insufficient documentation

## 2020-06-02 ENCOUNTER — Other Ambulatory Visit: Payer: Self-pay

## 2020-06-02 ENCOUNTER — Encounter: Payer: Self-pay | Admitting: Nurse Practitioner

## 2020-06-02 ENCOUNTER — Ambulatory Visit (INDEPENDENT_AMBULATORY_CARE_PROVIDER_SITE_OTHER): Payer: Medicare Other | Admitting: Nurse Practitioner

## 2020-06-02 VITALS — BP 128/71 | HR 84 | Temp 98.0°F | Ht 65.28 in | Wt 143.2 lb

## 2020-06-02 DIAGNOSIS — I152 Hypertension secondary to endocrine disorders: Secondary | ICD-10-CM | POA: Diagnosis not present

## 2020-06-02 DIAGNOSIS — E1159 Type 2 diabetes mellitus with other circulatory complications: Secondary | ICD-10-CM

## 2020-06-02 DIAGNOSIS — I739 Peripheral vascular disease, unspecified: Secondary | ICD-10-CM | POA: Diagnosis not present

## 2020-06-02 DIAGNOSIS — Z125 Encounter for screening for malignant neoplasm of prostate: Secondary | ICD-10-CM

## 2020-06-02 DIAGNOSIS — E1169 Type 2 diabetes mellitus with other specified complication: Secondary | ICD-10-CM

## 2020-06-02 DIAGNOSIS — E785 Hyperlipidemia, unspecified: Secondary | ICD-10-CM

## 2020-06-02 DIAGNOSIS — E11621 Type 2 diabetes mellitus with foot ulcer: Secondary | ICD-10-CM | POA: Diagnosis not present

## 2020-06-02 DIAGNOSIS — E1140 Type 2 diabetes mellitus with diabetic neuropathy, unspecified: Secondary | ICD-10-CM

## 2020-06-02 DIAGNOSIS — L97429 Non-pressure chronic ulcer of left heel and midfoot with unspecified severity: Secondary | ICD-10-CM

## 2020-06-02 DIAGNOSIS — I77811 Abdominal aortic ectasia: Secondary | ICD-10-CM

## 2020-06-02 DIAGNOSIS — E538 Deficiency of other specified B group vitamins: Secondary | ICD-10-CM

## 2020-06-02 DIAGNOSIS — I2510 Atherosclerotic heart disease of native coronary artery without angina pectoris: Secondary | ICD-10-CM

## 2020-06-02 LAB — BAYER DCA HB A1C WAIVED: HB A1C (BAYER DCA - WAIVED): 6.3 % (ref <7.0)

## 2020-06-02 MED ORDER — DOXYCYCLINE HYCLATE 100 MG PO TABS: 100.0000 mg | ORAL_TABLET | Freq: Two times a day (BID) | ORAL | 0 refills | Status: AC

## 2020-06-02 MED ORDER — MUPIROCIN 2 % EX OINT: 1.0000 "application " | TOPICAL_OINTMENT | Freq: Two times a day (BID) | CUTANEOUS | 1 refills | Status: AC

## 2020-06-02 NOTE — Assessment & Plan Note (Signed)
Chronic, stable with history of 2 stents in 2005.  Continue current medication regimen and collaboration with cardiology.

## 2020-06-02 NOTE — Assessment & Plan Note (Signed)
Followed by cardiology at [facility], continue this collaboration.  Recent note reviewed.

## 2020-06-02 NOTE — Assessment & Plan Note (Signed)
Chronic, ongoing with BP at goal today.  Recommend he monitor BP at least a few mornings a week at home and document.  DASH diet at home.  Continue current medication regimen and adjust as needed.  Labs today: TSH and BMP -- unable to void to provide urine.  Return in 3 months.

## 2020-06-02 NOTE — Assessment & Plan Note (Signed)
Chronic, stable.  Cholesterol levels checked today.  Continue atorvastatin 40 mg daily and adjust as needed.

## 2020-06-02 NOTE — Patient Instructions (Signed)
Wound Care, Adult Taking care of your wound properly can help to prevent pain, infection, and scarring. It can also help your wound heal more quickly. Follow instructions from your health care provider about how to care for your wound. Supplies needed:  Soap and water.  Wound cleanser.  Gauze.  If needed, a clean bandage (dressing) or other type of wound dressing material to cover or place in the wound. Follow your health care provider's instructions about what dressing supplies to use.  Cream or ointment to apply to the wound, if told by your health care provider. How to care for your wound Cleaning the wound Ask your health care provider how to clean the wound. This may include:  Using mild soap and water or a wound cleanser.  Using a clean gauze to pat the wound dry after cleaning it. Do not rub or scrub the wound. Dressing care  Wash your hands with soap and water for at least 20 seconds before and after you change the dressing. If soap and water are not available, use hand sanitizer.  Change your dressing as told by your health care provider. This may include: ? Cleaning or rinsing out (irrigating) the wound. ? Placing a dressing over the wound or in the wound (packing). ? Covering the wound with an outer dressing.  Leave any stitches (sutures), skin glue, or adhesive strips in place. These skin closures may need to stay in place for 2 weeks or longer. If adhesive strip edges start to loosen and curl up, you may trim the loose edges. Do not remove adhesive strips completely unless your health care provider tells you to do that.  Ask your health care provider when you can leave the wound uncovered. Checking for infection Check your wound area every day for signs of infection. Check for:  More redness, swelling, or pain.  Fluid or blood.  Warmth.  Pus or a bad smell.   Follow these instructions at home Medicines  If you were prescribed an antibiotic medicine, cream, or  ointment, take or apply it as told by your health care provider. Do not stop using the antibiotic even if your condition improves.  If you were prescribed pain medicine, take it 30 minutes before you do any wound care or as told by your health care provider.  Take over-the-counter and prescription medicines only as told by your health care provider. Eating and drinking  Eat a diet that includes protein, vitamin A, vitamin C, and other nutrient-rich foods to help the wound heal. ? Foods rich in protein include meat, fish, eggs, dairy, beans, and nuts. ? Foods rich in vitamin A include carrots and dark green, leafy vegetables. ? Foods rich in vitamin C include citrus fruits, tomatoes, broccoli, and peppers.  Drink enough fluid to keep your urine pale yellow. General instructions  Do not take baths, swim, use a hot tub, or do anything that would put the wound underwater until your health care provider approves. Ask your health care provider if you may take showers. You may only be allowed to take sponge baths.  Do not scratch or pick at the wound. Keep it covered as told by your health care provider.  Return to your normal activities as told by your health care provider. Ask your health care provider what activities are safe for you.  Protect your wound from the sun when you are outside for the first 6 months, or for as long as told by your health care provider. Cover   up the scar area or apply sunscreen that has an SPF of at least 30.  Do not use any products that contain nicotine or tobacco, such as cigarettes, e-cigarettes, and chewing tobacco. These may delay wound healing. If you need help quitting, ask your health care provider.  Keep all follow-up visits as told by your health care provider. This is important. Contact a health care provider if:  You received a tetanus shot and you have swelling, severe pain, redness, or bleeding at the injection site.  Your pain is not controlled  with medicine.  You have any of these signs of infection: ? More redness, swelling, or pain around the wound. ? Fluid or blood coming from the wound. ? Warmth coming from the wound. ? Pus or a bad smell coming from the wound. ? A fever or chills.  You are nauseous or you vomit.  You are dizzy. Get help right away if:  You have a red streak of skin near the area around your wound.  Your wound has been closed with staples, sutures, skin glue, or adhesive strips and it begins to open up and separate.  Your wound is bleeding, and the bleeding does not stop with gentle pressure.  You have a rash.  You faint.  You have trouble breathing. These symptoms may represent a serious problem that is an emergency. Do not wait to see if the symptoms will go away. Get medical help right away. Call your local emergency services (911 in the [place] Do not drive yourself to the hospital. Summary  Always wash your hands with soap and water for at least 20 seconds before and after changing your dressing.  Change your dressing as told by your health care provider.  To help with healing, eat foods that are rich in protein, vitamin A, vitamin C, and other nutrients.  Check your wound every day for signs of infection. Contact your health care provider if you suspect that your wound is infected. This information is not intended to replace advice given to you by your health care provider. Make sure you discuss any questions you have with your health care provider. Document Revised: 02/15/2019 Document Reviewed: 02/15/2019 Elsevier Patient Education  2021 [employer]  

## 2020-06-02 NOTE — Assessment & Plan Note (Signed)
Chronic, ongoing.  No past vascular assessment per patient.  Will place urgent referral to vascular today for further assessment and recommendations due to current wound that is poorly healing and concern for future wounds with his T2DM.

## 2020-06-02 NOTE — Assessment & Plan Note (Signed)
Chronic, ongoing with A1c remaining stable today at 6.3%.  Continue current medication regimen and adjust as needed.  Recommend he monitor BS at home daily in morning while fasting.  Document these for provider.  Unable to obtain urine at visit today, recommend he return sample to office for urine ALB check.  Return in 3 months for diabetes check.

## 2020-06-02 NOTE — Progress Notes (Signed)
BP 128/71   Pulse 84   Temp 98 F (36.7 C) (Oral)   Ht 5' 5.28" (1.658 m)   Wt 143 lb 3.2 oz (65 kg)   SpO2 95%   BMI 23.63 kg/m    Subjective:    Patient ID: Ivan Larsen, male    DOB: 08-18-1955, 65 y.o.   MRN: [id number]  HPI: Ivan Larsen is a 65 y.o. male  Chief Complaint  Patient presents with  . Diabetes  . Ulcer on foot    Left foot ulcer, Patient states it is about the same.   DIABETES Last A1c 6.4% in September.  Continues on Jardiance, Metformin, and Januvia.   Hypoglycemic episodes:no Polydipsia/polyuria: no Visual disturbance: no Chest pain: no Paresthesias: no Glucose Monitoring: yes  Accucheck frequency: rarely  Fasting glucose: unsure -- he reports they are good  Post prandial:  Evening:  Before meals: Taking Insulin?: no  Long acting insulin:  Short acting insulin: Blood Pressure Monitoring: not checking Retinal Examination: Not up to Date Foot Exam: Up to Date Pneumovax: refuses Influenza: refuses Aspirin: no   HYPERTENSION / HYPERLIPIDEMIA Continues on Amlodipine. Benazepril, HCTZ, Metoprolol, and Atorvastatin + Plavix.  Has history of 2 stents in 2005.  Last saw Dr. Gwen Pounds 04/06/20 -- reports he monitor his heart and aortic ectasia. Satisfied with current treatment? yes Duration of hypertension: chronic BP monitoring frequency: not checking BP range:  BP medication side effects: no Duration of hyperlipidemia: chronic Cholesterol medication side effects: no Cholesterol supplements: fish oil Medication compliance: good compliance Aspirin: no Recent stressors: no Recurrent headaches: no Visual changes: no Palpitations: no Dyspnea: no Chest pain: no Lower extremity edema: no Dizzy/lightheaded: no  SKIN WOUND  Follow-up for ulcer left heel -- initially seen on 02/07/20.  Was seen in clinic for follow-up last on 04/14/20 for diabetic ulcer of left heel in presence of underlying T2DM.  Was to see podiatry, has not seen  since 02/11/20 -- was to return but did not feel well.  Last visit the provider prescribed Santyl for debridement and recommend wound care at home -- he refuses home wound care referral -- wishes to wait and see wound clinic.  Did have wound care referral last visit, has not seen as of yet -- scheduled February 2nd at 0830.  Does endorse discomfort to area, stinging and burning.  Taking Gabapentin 300 MG TID.    He does report on and off edema to left leg.   Duration: months Location: left heel History of trauma in area: no Pain: yes Quality: yes Severity: 8/10 Redness: no Swelling: no Oozing: no Pus: no Fevers: no Nausea/vomiting: no Status: fluctuating Treatments attempted:abx and Santyl Tetanus: UTD   Relevant past medical, surgical, family and social history reviewed and updated as indicated. Interim medical history since our last visit reviewed. Allergies and medications reviewed and updated.  Review of Systems  Constitutional: Negative for activity change, diaphoresis, fatigue and fever.  Respiratory: Negative for cough, chest tightness, shortness of breath and wheezing.   Cardiovascular: Negative for chest pain, palpitations and leg swelling.  Skin: Positive for wound.  Psychiatric/Behavioral: Negative.     Per HPI unless specifically indicated above     Objective:    BP 128/71   Pulse 84   Temp 98 F (36.7 C) (Oral)   Ht 5' 5.28" (1.658 m)   Wt 143 lb 3.2 oz (65 kg)   SpO2 95%   BMI 23.63 kg/m   Wt Readings from Last 3  Encounters:  06/02/20 143 lb 3.2 oz (65 kg)  05/05/20 143 lb 12.8 oz (65.2 kg)  04/14/20 142 lb 2 oz (64.5 kg)    Physical Exam Vitals and nursing note reviewed.  Constitutional:      General: He is awake. He is not in acute distress.    Appearance: He is well-developed and well-groomed. He is not ill-appearing.  HENT:     Head: Normocephalic and atraumatic.     Right Ear: Hearing, ear canal and external ear normal. No drainage.     Left  Ear: Hearing, ear canal and external ear normal. No drainage.  Eyes:     General: Lids are normal.        Right eye: No discharge.        Left eye: No discharge.     Conjunctiva/sclera: Conjunctivae normal.     Pupils: Pupils are equal, round, and reactive to light.  Neck:     Vascular: No carotid bruit.     Trachea: Trachea normal.  Cardiovascular:     Rate and Rhythm: Normal rate and regular rhythm.     Pulses:          Dorsalis pedis pulses are 1+ on the right side and 1+ on the left side.       Posterior tibial pulses are 1+ on the right side and 1+ on the left side.     Heart sounds: Normal heart sounds, S1 normal and S2 normal. No murmur heard. No gallop.   Pulmonary:     Effort: Pulmonary effort is normal. No accessory muscle usage or respiratory distress.     Breath sounds: Normal breath sounds.  Abdominal:     General: Bowel sounds are normal.     Palpations: Abdomen is soft. There is no hepatomegaly or splenomegaly.  Musculoskeletal:        General: Normal range of motion.     Cervical back: Normal range of motion and neck supple.     Right lower leg: No edema.     Left lower leg: No edema.       Feet:  Feet:     Right foot:     Protective Sensation: 10 sites tested. 8 sites sensed.     Skin integrity: Skin integrity normal.     Toenail Condition: Right toenails are normal.     Left foot:     Protective Sensation: 10 sites tested. 8 sites sensed.     Skin integrity: Ulcer present.     Toenail Condition: Left toenails are normal.     Comments: Some mild erythema present and he reports occasional edema to extremity since wound presented, none present today. Skin:    General: Skin is warm and dry.     Capillary Refill: Capillary refill takes less than 2 seconds.  Neurological:     Mental Status: He is alert and oriented to person, place, and time.     Deep Tendon Reflexes: Reflexes are normal and symmetric.     Reflex Scores:      Brachioradialis reflexes are 2+  on the right side and 2+ on the left side.      Patellar reflexes are 2+ on the right side and 2+ on the left side. Psychiatric:        Attention and Perception: Attention normal.        Mood and Affect: Mood normal.        Speech: Speech normal.  Behavior: Behavior normal. Behavior is cooperative.        Thought Content: Thought content normal.     Results for orders placed or performed during the hospital encounter of 03/11/20  Lipase, blood  Result Value Ref Range   Lipase 28 11 - 51 U/L  Comprehensive metabolic panel  Result Value Ref Range   Sodium 139 135 - 145 mmol/L   Potassium 3.4 (L) 3.5 - 5.1 mmol/L   Chloride 94 (L) 98 - 111 mmol/L   CO2 28 22 - 32 mmol/L   Glucose, Bld 184 (H) 70 - 99 mg/dL   BUN 28 (H) 8 - 23 mg/dL   Creatinine, Ser 5.32 0.61 - 1.24 mg/dL   Calcium 9.6 8.9 - 99.2 mg/dL   Total Protein 7.9 6.5 - 8.1 g/dL   Albumin 5.0 3.5 - 5.0 g/dL   AST 30 15 - 41 U/L   ALT 30 0 - 44 U/L   Alkaline Phosphatase 45 38 - 126 U/L   Total Bilirubin 1.2 0.3 - 1.2 mg/dL   GFR, Estimated >42 >68 mL/min   Anion gap 17 (H) 5 - 15  CBC  Result Value Ref Range   WBC 18.1 (H) 4.0 - 10.5 K/uL   RBC 4.64 4.22 - 5.81 MIL/uL   Hemoglobin 14.1 13.0 - 17.0 g/dL   HCT 34.1 96.2 - 22.9 %   MCV 87.1 80.0 - 100.0 fL   MCH 30.4 26.0 - 34.0 pg   MCHC 34.9 30.0 - 36.0 g/dL   RDW 79.8 92.1 - 19.4 %   Platelets 248 150 - 400 K/uL   nRBC 0.0 0.0 - 0.2 %  Urinalysis, Complete w Microscopic  Result Value Ref Range   Color, Urine YELLOW (A) YELLOW   APPearance CLEAR (A) CLEAR   Specific Gravity, Urine 1.025 1.005 - 1.030   pH 6.0 5.0 - 8.0   Glucose, UA >=500 (A) NEGATIVE mg/dL   Hgb urine dipstick NEGATIVE NEGATIVE   Bilirubin Urine NEGATIVE NEGATIVE   Ketones, ur 20 (A) NEGATIVE mg/dL   Protein, ur 30 (A) NEGATIVE mg/dL   Nitrite NEGATIVE NEGATIVE   Leukocytes,Ua NEGATIVE NEGATIVE   RBC / HPF 0-5 0 - 5 RBC/hpf   WBC, UA 0-5 0 - 5 WBC/hpf   Bacteria, UA NONE SEEN  NONE SEEN   Squamous Epithelial / LPF 0-5 0 - 5  Troponin I (High Sensitivity)  Result Value Ref Range   Troponin I (High Sensitivity) 11 <18 ng/L      Assessment & Plan:   Problem List Items Addressed This Visit      Cardiovascular and Mediastinum   Atherosclerotic heart disease of native coronary artery without angina pectoris    Chronic, stable with history of 2 stents in 2005.  Continue current medication regimen and collaboration with cardiology.      Aortic ectasia, abdominal (HCC)    Followed by cardiology at [facility], continue this collaboration.  Recent note reviewed.      Hypertension associated with diabetes (HCC)    Chronic, ongoing with BP at goal today.  Recommend he monitor BP at least a few mornings a week at home and document.  DASH diet at home.  Continue current medication regimen and adjust as needed.  Labs today: TSH and BMP -- unable to void to provide urine.  Return in 3 months.       Relevant Orders   Bayer DCA Hb A1c Waived   Basic metabolic panel   TSH  Peripheral vascular disease (HCC)    Chronic, ongoing.  No past vascular assessment per patient.  Will place urgent referral to vascular today for further assessment and recommendations due to current wound that is poorly healing and concern for future wounds with his T2DM.        Relevant Orders   Ambulatory referral to Vascular Surgery     Endocrine   Hyperlipidemia associated with type 2 diabetes mellitus (HCC)    Chronic, stable.  Cholesterol levels checked today.  Continue atorvastatin 40 mg daily and adjust as needed.      Relevant Orders   Bayer DCA Hb A1c Waived   Lipid Panel w/o Chol/HDL Ratio   Diabetic ulcer of left heel associated with type 2 diabetes mellitus (HCC) - Primary    Acute, ongoing for 3 months.  Continue twice daily debridement ointment to eschar, Santyl.  Discussed wound care at length-patient to clean wound with only mild soap and water (NOT hydrogen peroxide) and apply  debridement ointment after and cover with gauze and tape.  He is to change his wound twice daily and follow-up in 4 weeks at office.  Seeing wound care in February and refuses home wound care.  Doxycycline script sent today.  He is aware to immediately notify provider if any erythema, warmth, drainage, or increase size presents.  Cotninue Gabapentin 300 MG TID for discomfort.  Vascular referral placed.      Relevant Medications   mupirocin ointment (BACTROBAN) 2 %   Other Relevant Orders   Ambulatory referral to Vascular Surgery   Type 2 diabetes mellitus with diabetic neuropathy, without long-term current use of insulin (HCC)    Chronic, ongoing with A1c remaining stable today at 6.3%.  Continue current medication regimen and adjust as needed.  Recommend he monitor BS at home daily in morning while fasting.  Document these for provider.  Unable to obtain urine at visit today, recommend he return sample to office for urine ALB check.  Return in 3 months for diabetes check.      Relevant Orders   Bayer DCA Hb A1c Waived   Microalbumin, Urine Waived    Other Visit Diagnoses    Vitamin B12 deficiency       History of low levels reported, check today and start supplement as needed.  Is on long term Metformin.   Relevant Orders   Vitamin B12   Prostate cancer screening       PSA on labs today.   Relevant Orders   PSA       Follow up plan: Return in about 4 weeks (around 06/30/2020) for Wound Check.

## 2020-06-02 NOTE — Assessment & Plan Note (Signed)
Acute, ongoing for 3 months.  Continue twice daily debridement ointment to eschar, Santyl.  Discussed wound care at length-patient to clean wound with only mild soap and water (NOT hydrogen peroxide) and apply debridement ointment after and cover with gauze and tape.  He is to change his wound twice daily and follow-up in 4 weeks at office.  Seeing wound care in February and refuses home wound care.  Doxycycline script sent today.  He is aware to immediately notify provider if any erythema, warmth, drainage, or increase size presents.  Cotninue Gabapentin 300 MG TID for discomfort.  Vascular referral placed.

## 2020-06-03 ENCOUNTER — Other Ambulatory Visit: Payer: Self-pay | Admitting: Nurse Practitioner

## 2020-06-03 DIAGNOSIS — I341 Nonrheumatic mitral (valve) prolapse: Secondary | ICD-10-CM | POA: Insufficient documentation

## 2020-06-03 DIAGNOSIS — I2510 Atherosclerotic heart disease of native coronary artery without angina pectoris: Secondary | ICD-10-CM | POA: Diagnosis not present

## 2020-06-03 DIAGNOSIS — I10 Essential (primary) hypertension: Secondary | ICD-10-CM | POA: Diagnosis not present

## 2020-06-03 DIAGNOSIS — E782 Mixed hyperlipidemia: Secondary | ICD-10-CM | POA: Diagnosis not present

## 2020-06-03 DIAGNOSIS — I70219 Atherosclerosis of native arteries of extremities with intermittent claudication, unspecified extremity: Secondary | ICD-10-CM | POA: Diagnosis not present

## 2020-06-03 DIAGNOSIS — E11621 Type 2 diabetes mellitus with foot ulcer: Secondary | ICD-10-CM

## 2020-06-03 LAB — BASIC METABOLIC PANEL
BUN/Creatinine Ratio: 21 (ref 10–24)
BUN: 24 mg/dL (ref 8–27)
CO2: 21 mmol/L (ref 20–29)
Calcium: 10.2 mg/dL (ref 8.6–10.2)
Chloride: 99 mmol/L (ref 96–106)
Creatinine, Ser: 1.16 mg/dL (ref 0.76–1.27)
GFR calc Af Amer: 76 mL/min/{1.73_m2} (ref >59)
GFR calc non Af Amer: 66 mL/min/{1.73_m2} (ref >59)
Glucose: 153 mg/dL — ABNORMAL HIGH (ref 65–99)
Potassium: 4.1 mmol/L (ref 3.5–5.2)
Sodium: 140 mmol/L (ref 134–144)

## 2020-06-03 LAB — LIPID PANEL W/O CHOL/HDL RATIO
Cholesterol, Total: 79 mg/dL — ABNORMAL LOW (ref 100–199)
HDL: 27 mg/dL — ABNORMAL LOW (ref >39)
LDL Chol Calc (NIH): 26 mg/dL (ref 0–99)
Triglycerides: 152 mg/dL — ABNORMAL HIGH (ref 0–149)
VLDL Cholesterol Cal: 26 mg/dL (ref 5–40)

## 2020-06-03 LAB — VITAMIN B12: Vitamin B-12: >2000 pg/mL — ABNORMAL HIGH (ref 232–1245)

## 2020-06-03 LAB — PSA: Prostate Specific Ag, Serum: 1.2 ng/mL (ref 0.0–4.0)

## 2020-06-03 LAB — TSH: TSH: 0.754 u[IU]/mL (ref 0.450–4.500)

## 2020-06-03 NOTE — Progress Notes (Signed)
Please let Ousman know his labs have returned.  Kidney function remains stable.  Thyroid and prostate labs normal.  Cholesterol levels at goal.  B12 level is normal.  I am going to try to add CBC to his recent labs, if unable to I may have him come back in to check this due to his wound.  Want to ensure no infection.  I would also like to obtain x-ray imaging of heel, which I will order for [facility] location and he can walk in at his convenience and have done.  If any questions please let me know.   Keep being awesome!!  Thank you for allowing me to participate in your care. Kindest regards, Lonita Debes

## 2020-06-04 DIAGNOSIS — E1159 Type 2 diabetes mellitus with other circulatory complications: Secondary | ICD-10-CM | POA: Diagnosis not present

## 2020-06-04 DIAGNOSIS — E785 Hyperlipidemia, unspecified: Secondary | ICD-10-CM | POA: Diagnosis not present

## 2020-06-04 DIAGNOSIS — E1169 Type 2 diabetes mellitus with other specified complication: Secondary | ICD-10-CM | POA: Diagnosis not present

## 2020-06-04 DIAGNOSIS — I152 Hypertension secondary to endocrine disorders: Secondary | ICD-10-CM | POA: Diagnosis not present

## 2020-06-04 NOTE — Addendum Note (Signed)
Addended by: Pablo Ledger on: 06/04/2020 02:27 PM   Modules accepted: Orders

## 2020-06-05 ENCOUNTER — Other Ambulatory Visit: Payer: Self-pay | Admitting: Nurse Practitioner

## 2020-06-05 LAB — CBC WITH DIFFERENTIAL/PLATELET
Basophils Absolute: 0.1 10*3/uL (ref 0.0–0.2)
Basos: 1 %
EOS (ABSOLUTE): 0.3 10*3/uL (ref 0.0–0.4)
Eos: 2 %
Hematocrit: 45.6 % (ref 37.5–51.0)
Hemoglobin: 14.7 g/dL (ref 13.0–17.7)
Immature Grans (Abs): 0 10*3/uL (ref 0.0–0.1)
Immature Granulocytes: 0 %
Lymphocytes Absolute: 2.1 10*3/uL (ref 0.7–3.1)
Lymphs: 14 %
MCH: 28.4 pg (ref 26.6–33.0)
MCHC: 32.2 g/dL (ref 31.5–35.7)
MCV: 88 fL (ref 79–97)
Monocytes Absolute: 1.2 10*3/uL — ABNORMAL HIGH (ref 0.1–0.9)
Monocytes: 8 %
Neutrophils Absolute: 11.3 10*3/uL — ABNORMAL HIGH (ref 1.4–7.0)
Neutrophils: 75 %
Platelets: 320 10*3/uL (ref 150–450)
RBC: 5.17 x10E6/uL (ref 4.14–5.80)
RDW: 12.7 % (ref 11.6–15.4)
WBC: 15 10*3/uL — ABNORMAL HIGH (ref 3.4–10.8)

## 2020-06-05 NOTE — Progress Notes (Signed)
Error

## 2020-06-05 NOTE — Progress Notes (Signed)
Good morning, please let Ivan Larsen know his lab has returned and he continues to have mild elevation in white blood cell count and neutrophils -- showing infection continues, although levels have improved.  Continue antibiotic as ordered and ensure to attend wound clinic and vascular visit.  We may need to make wound care changes, such as using Aquacel AG vs Santyl in future.  Any questions?

## 2020-06-08 ENCOUNTER — Telehealth: Payer: Self-pay

## 2020-06-08 MED ORDER — HYDROCODONE-ACETAMINOPHEN 5-325 MG PO TABS
1.0000 | ORAL_TABLET | Freq: Four times a day (QID) | ORAL | 0 refills | Status: DC | PRN
Start: 2020-06-08 — End: 2020-06-18

## 2020-06-08 NOTE — Addendum Note (Signed)
Addended by: Aura Dials T on: 06/08/2020 01:03 PM   Modules accepted: Orders

## 2020-06-08 NOTE — Telephone Encounter (Signed)
Called to notify patient of lab results, patient notified, he states that his appt for vascular and wound is not until 06/16/20 and 06/17/20. He states that it is causing him a lot of pain at night, that its at a 10 at night, would like to know if there is anything that he can be given to help with this pain, he states that he is taking the gabapentin.

## 2020-06-08 NOTE — Telephone Encounter (Signed)
Patient notified

## 2020-06-08 NOTE — Telephone Encounter (Signed)
I have sent in 5 day supply of Norco, with opioid to take at night as needed.  However, if any worsening pain or redness, drainage, fever, warmth noted to wound then I want him to immediately go to ER for assessment.

## 2020-06-10 ENCOUNTER — Ambulatory Visit
Admission: RE | Admit: 2020-06-10 | Discharge: 2020-06-10 | Disposition: A | Discharge status: Home / Self Care | Payer: Medicare Other | Attending: Nurse Practitioner | Admitting: Nurse Practitioner

## 2020-06-10 ENCOUNTER — Ambulatory Visit
Admission: RE | Admit: 2020-06-10 | Discharge: 2020-06-10 | Disposition: A | Discharge status: Home / Self Care | Payer: Medicare Other | Source: Ambulatory Visit | Attending: Nurse Practitioner | Admitting: Nurse Practitioner

## 2020-06-10 ENCOUNTER — Other Ambulatory Visit: Payer: Self-pay

## 2020-06-10 DIAGNOSIS — L97428 Non-pressure chronic ulcer of left heel and midfoot with other specified severity: Secondary | ICD-10-CM | POA: Insufficient documentation

## 2020-06-10 DIAGNOSIS — E11621 Type 2 diabetes mellitus with foot ulcer: Secondary | ICD-10-CM | POA: Insufficient documentation

## 2020-06-10 DIAGNOSIS — S91302A Unspecified open wound, left foot, initial encounter: Secondary | ICD-10-CM | POA: Diagnosis not present

## 2020-06-10 IMAGING — DX DG FOOT COMPLETE 3+V*L*
3 series · 3 of 3 positions shown · non-contrast
Comparison: [DATE]

CLINICAL DATA: Non healing wound to LEFT heel.

EXAM:
LEFT FOOT - COMPLETE 3+ VIEW

[foot ap]
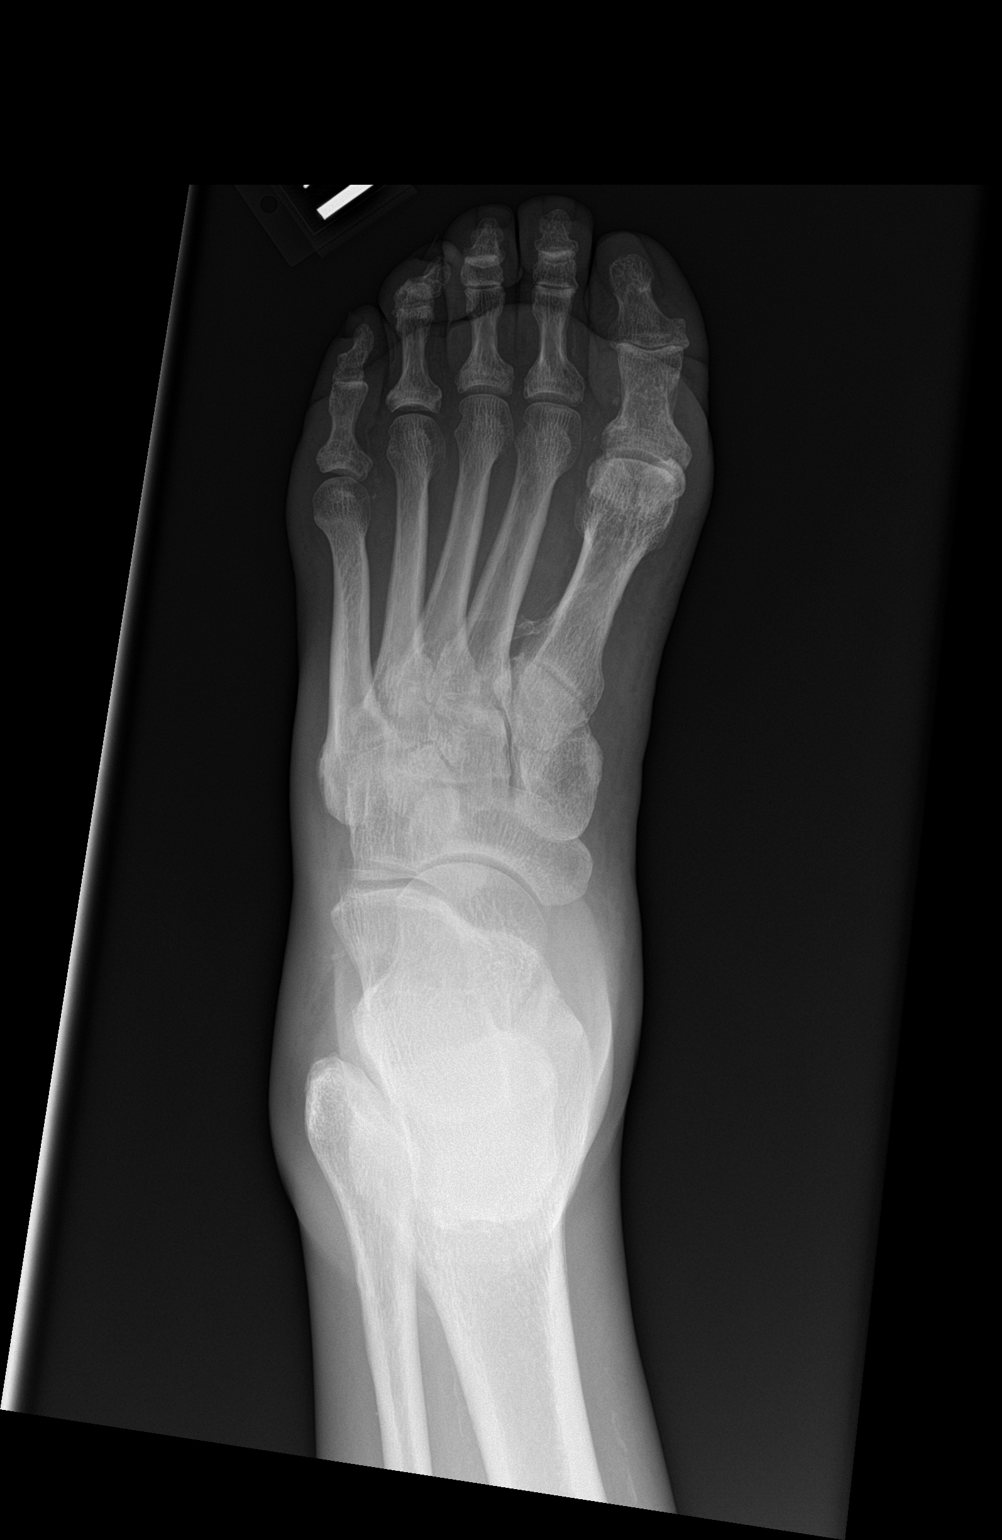

[foot obl]
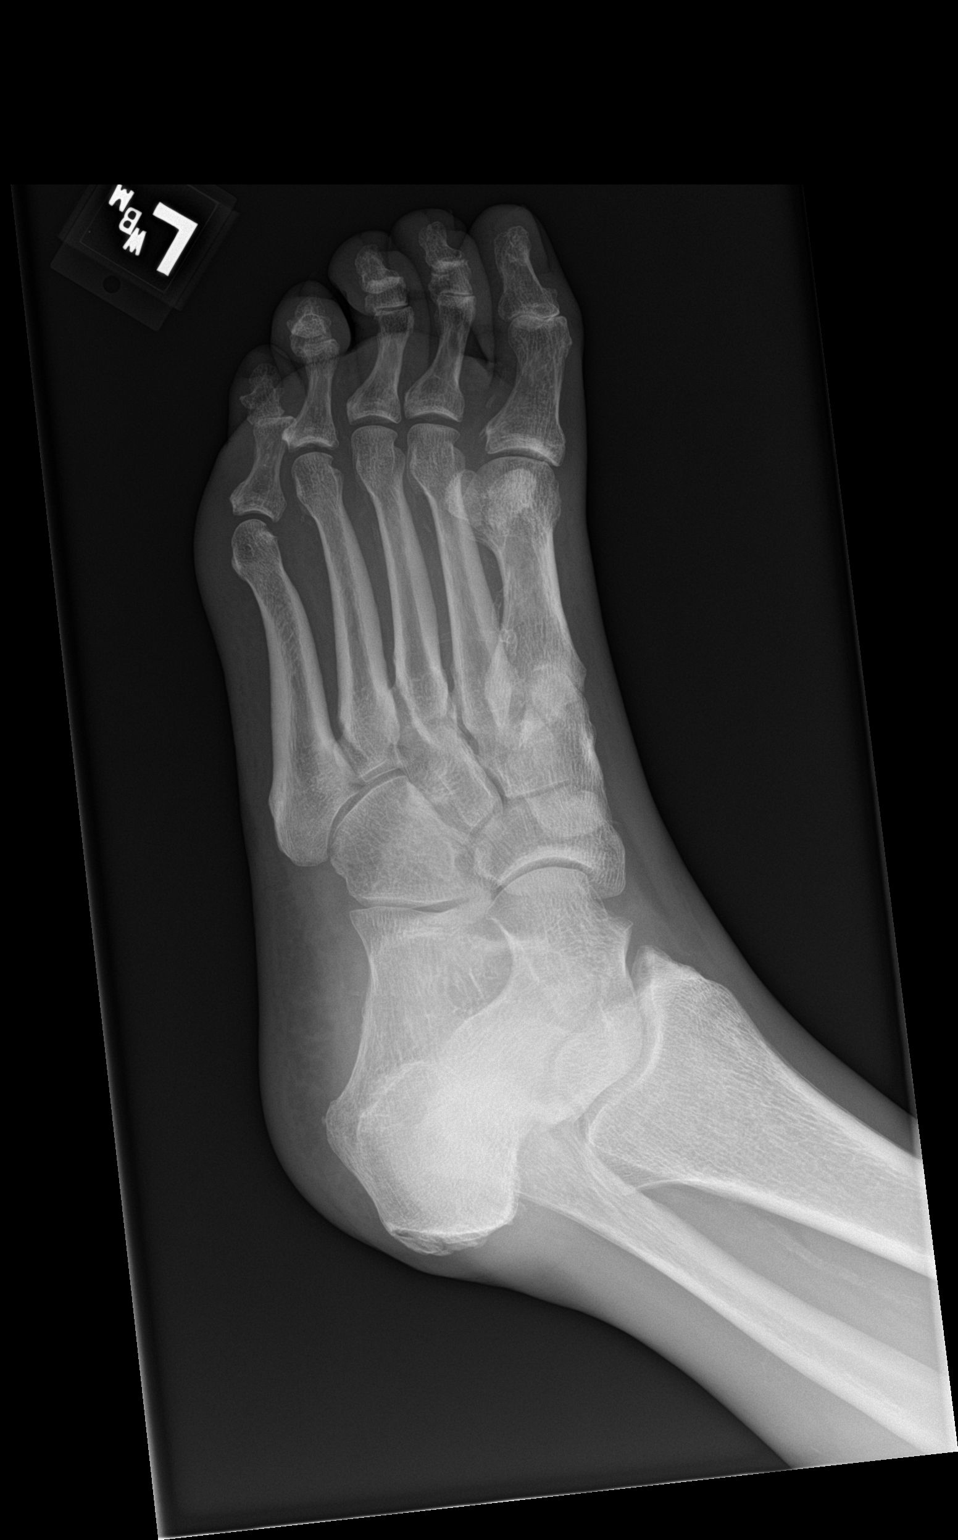

[foot lat]
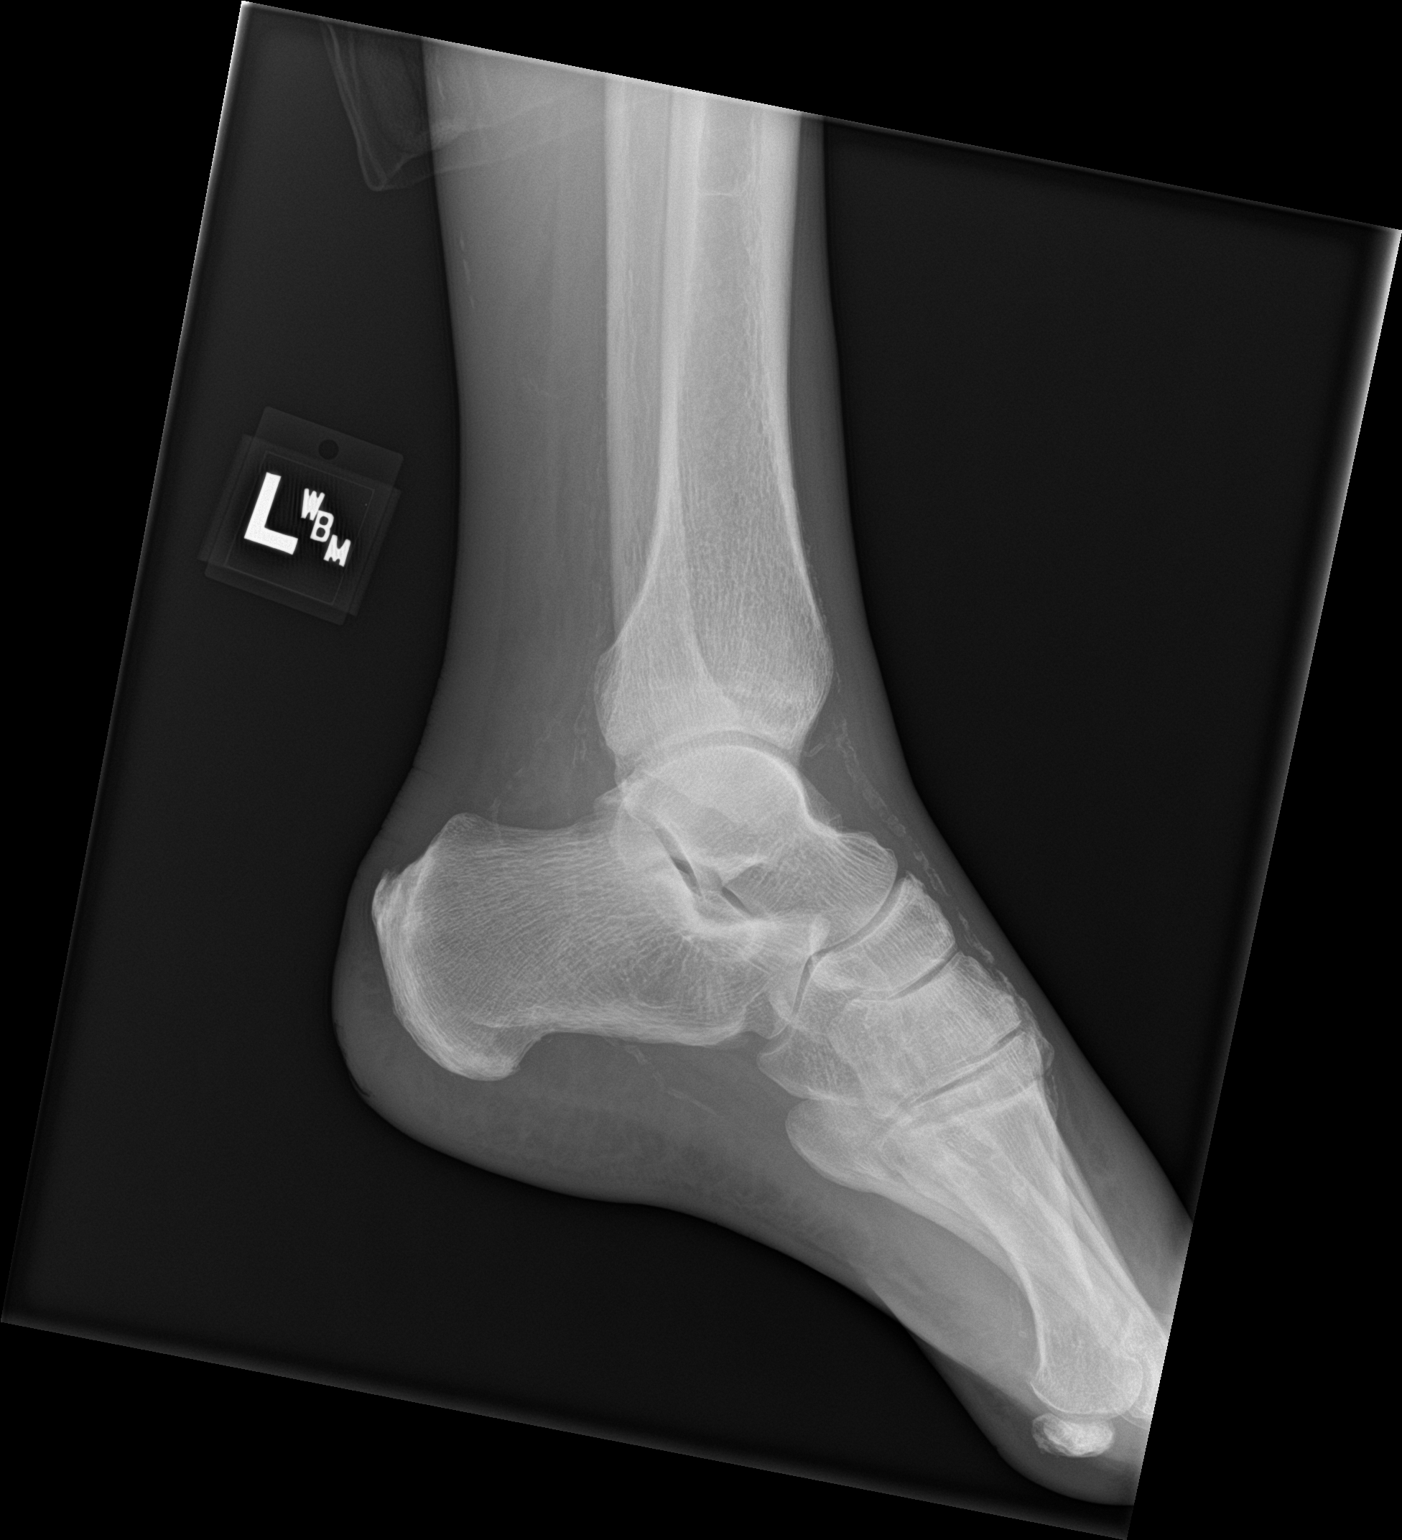

[3 of 3 positions shown; findings below may reference images not displayed]

FINDINGS: No fracture, subluxation or dislocation identified.

No radiographic evidence of acute osteomyelitis identified.

Heavy vascular calcifications are present.

No focal bony lesions are noted.
IMPRESSION: 1. No acute abnormality. No radiographic evidence of acute
osteomyelitis.
2. Heavy vascular calcifications.

## 2020-06-10 NOTE — Progress Notes (Signed)
Good morning, please let Ivan Larsen know that imaging noted no evidence of acute bone inflammation or infection --- this is good news, as is a concern with wounds.  I am glad he is seeing vascular and wound care upcoming, please ensure to keep these visits and immediately return to see me or go to ER if any worsening wound presentation or increase pain or fever.  Check temperature daily and if >99 or 100 consistently let me know.  Any questions? Keep being awesome!!  Thank you for allowing me to participate in your care. Kindest regards, Maleigha Colvard

## 2020-06-15 ENCOUNTER — Other Ambulatory Visit (INDEPENDENT_AMBULATORY_CARE_PROVIDER_SITE_OTHER): Payer: Self-pay | Admitting: Vascular Surgery

## 2020-06-15 DIAGNOSIS — L97429 Non-pressure chronic ulcer of left heel and midfoot with unspecified severity: Secondary | ICD-10-CM

## 2020-06-15 DIAGNOSIS — E10621 Type 1 diabetes mellitus with foot ulcer: Secondary | ICD-10-CM

## 2020-06-15 DIAGNOSIS — I739 Peripheral vascular disease, unspecified: Secondary | ICD-10-CM

## 2020-06-16 ENCOUNTER — Other Ambulatory Visit (INDEPENDENT_AMBULATORY_CARE_PROVIDER_SITE_OTHER): Payer: Self-pay | Admitting: Vascular Surgery

## 2020-06-16 ENCOUNTER — Ambulatory Visit (INDEPENDENT_AMBULATORY_CARE_PROVIDER_SITE_OTHER): Payer: Medicare Other | Admitting: Vascular Surgery

## 2020-06-16 ENCOUNTER — Ambulatory Visit (INDEPENDENT_AMBULATORY_CARE_PROVIDER_SITE_OTHER): Payer: Medicare Other

## 2020-06-16 ENCOUNTER — Encounter (INDEPENDENT_AMBULATORY_CARE_PROVIDER_SITE_OTHER): Payer: Medicare Other

## 2020-06-16 ENCOUNTER — Encounter (INDEPENDENT_AMBULATORY_CARE_PROVIDER_SITE_OTHER): Payer: Self-pay | Admitting: Vascular Surgery

## 2020-06-16 ENCOUNTER — Other Ambulatory Visit: Payer: Self-pay

## 2020-06-16 ENCOUNTER — Encounter (INDEPENDENT_AMBULATORY_CARE_PROVIDER_SITE_OTHER): Payer: Medicare Other | Admitting: Vascular Surgery

## 2020-06-16 VITALS — BP 155/79 | HR 63 | Resp 16 | Ht 65.5 in | Wt 142.0 lb

## 2020-06-16 DIAGNOSIS — E1140 Type 2 diabetes mellitus with diabetic neuropathy, unspecified: Secondary | ICD-10-CM | POA: Diagnosis not present

## 2020-06-16 DIAGNOSIS — E10621 Type 1 diabetes mellitus with foot ulcer: Secondary | ICD-10-CM

## 2020-06-16 DIAGNOSIS — E1159 Type 2 diabetes mellitus with other circulatory complications: Secondary | ICD-10-CM | POA: Diagnosis not present

## 2020-06-16 DIAGNOSIS — R6889 Other general symptoms and signs: Secondary | ICD-10-CM | POA: Diagnosis not present

## 2020-06-16 DIAGNOSIS — E11621 Type 2 diabetes mellitus with foot ulcer: Secondary | ICD-10-CM | POA: Diagnosis not present

## 2020-06-16 DIAGNOSIS — I739 Peripheral vascular disease, unspecified: Secondary | ICD-10-CM

## 2020-06-16 DIAGNOSIS — L97429 Non-pressure chronic ulcer of left heel and midfoot with unspecified severity: Secondary | ICD-10-CM | POA: Diagnosis not present

## 2020-06-16 DIAGNOSIS — I7025 Atherosclerosis of native arteries of other extremities with ulceration: Secondary | ICD-10-CM

## 2020-06-16 DIAGNOSIS — I152 Hypertension secondary to endocrine disorders: Secondary | ICD-10-CM

## 2020-06-16 DIAGNOSIS — I2510 Atherosclerotic heart disease of native coronary artery without angina pectoris: Secondary | ICD-10-CM

## 2020-06-16 NOTE — Assessment & Plan Note (Signed)
blood glucose control important in reducing the progression of atherosclerotic disease. Also, involved in wound healing. On appropriate medications.  

## 2020-06-16 NOTE — Progress Notes (Signed)
Patient ID: Ivan Larsen, male   DOB: [birth year], 65 y.o.   MRN: [id number]  Chief Complaint  Patient presents with  . New Patient (Initial Visit)    Ref Cannady diabetic ulcer with left heel ulcer    HPI IVO MOGA is a 65 y.o. male.  I am asked to see the patient by J. Cannady for evaluation of a non-healing ulceration of the left foot.  This started about a month ago without any clear cause or inciting event.  There was no local trauma to the area.  He is scheduled to see the wound care center tomorrow.  He has been putting an ointment and keeping a Band-Aid on this ulcer for the past couple of weeks.  It is mildly painful but he has longstanding diabetes and some degree of neuropathy of the lower extremities.  No fevers or chills or signs of systemic infection.  No current right leg symptoms.  He does have some claudication symptoms with activity and has for many years.  No rest pain at night.  Noninvasive studies were performed today showing significant disease bilaterally.  Duplex suggest some aortoiliac disease with monophasic flow in both common femoral arteries.  The left SFA is occluded by duplex.  ABIs were 1.0 on the right but may be falsely elevated as the waveforms were monophasic and the digital pressures were reduced.  ABI was 0.49 on the left with a digit pressure of only 35.   Past Medical History:  Diagnosis Date  . CAD (coronary artery disease)   . Diabetes mellitus without complication ([place])   . Elevated liver enzymes   . History of kidney stones   . Hyperlipidemia   . MI (myocardial infarction) ([place]) 07/01/12  . PAD (peripheral artery disease) ([place])     Past Surgical History:  Procedure Laterality Date  . CORONARY STENT PLACEMENT    . EYE SURGERY       Family History  Problem Relation Age of Onset  . Diabetes Mother   . Heart attack Father   no bleeding or clotting disorders   Social History   Tobacco Use  . Smoking status: Former Smoker     Types: Cigarettes    Quit date: 01/15/2004    Years since quitting: 16.4  . Smokeless tobacco: Never Used  Vaping Use  . Vaping Use: Never used  Substance Use Topics  . Alcohol use: Yes  . Drug use: No    Allergies  Allergen Reactions  . Iodinated Diagnostic Agents Nausea Only    Other reaction(s): Vomiting  . Iodine   . Shellfish Allergy Nausea And Vomiting and Other (See Comments)    Sweating  Sweating    . Shellfish-Derived Products Nausea And Vomiting and Other (See Comments)    Other reaction(s): Nausea And Vomiting, Other (See Comments) Sweating  Sweating     Current Outpatient Medications  Medication Sig Dispense Refill  . amLODipine (NORVASC) 10 MG tablet Take 1 tablet (10 mg total) by mouth daily. 90 tablet 1  . atorvastatin (LIPITOR) 40 MG tablet Take 1 tablet (40 mg total) by mouth daily. 90 tablet 4  . benazepril (LOTENSIN) 40 MG tablet Take 1 tablet (40 mg total) by mouth daily. 90 tablet 1  . clopidogrel (PLAVIX) 75 MG tablet Take 1 tablet (75 mg total) by mouth daily. 90 tablet 1  . collagenase (SANTYL) ointment Apply 1 application topically daily. 15 g 0  . empagliflozin (JARDIANCE) 25 MG TABS tablet Take  1 tablet (25 mg total) by mouth daily before breakfast. 90 tablet 1  . gabapentin (NEURONTIN) 300 MG capsule Take 1 capsule (300 mg total) by mouth 3 (three) times daily. 270 capsule 4  . hydrochlorothiazide (HYDRODIURIL) 25 MG tablet Take 1 tablet (25 mg total) by mouth daily. 90 tablet 1  . meclizine (ANTIVERT) 25 MG tablet Take 1 tablet (25 mg total) by mouth 3 (three) times daily as needed for dizziness or nausea. 30 tablet 1  . metFORMIN (GLUCOPHAGE) 500 MG tablet Take 2 tablets (1,000 mg total) by mouth 2 (two) times daily with a meal. 360 tablet 1  . metoprolol tartrate (LOPRESSOR) 50 MG tablet Take 1 tablet (50 mg total) by mouth 2 (two) times daily. 180 tablet 1  . mupirocin ointment (BACTROBAN) 2 % Apply 1 application topically 2 (two) times daily. 22  g 1  . Omega-3 Fatty Acids (FISH OIL OMEGA-3) 1000 MG CAPS Take by mouth 2 (two) times daily.    . ondansetron (ZOFRAN-ODT) 4 MG disintegrating tablet 1 TABLET ORALLY DISSOLVED EVERY 8 HOURS IF NEEDED FOR NAUSEA AND VOMITING 21 tablet 0  . sitaGLIPtin (JANUVIA) 25 MG tablet Take 1 tablet (25 mg total) by mouth daily. 90 tablet 1  . tamsulosin (FLOMAX) 0.4 MG CAPS capsule Take 1 capsule (0.4 mg total) by mouth at bedtime. Take at nighttime to minimize dizziness/hypotension 90 capsule 1  . doxycycline (VIBRA-TABS) 100 MG tablet Take 1 tablet (100 mg total) by mouth 2 (two) times daily. (Patient not taking: Reported on 06/16/2020) 20 tablet 0   No current facility-administered medications for this visit.      REVIEW OF SYSTEMS (Negative unless checked)  Constitutional: _0 Weight loss  _1 Fever  _2 Chills Cardiac: _3 Chest pain   _4 Chest pressure   _5 Palpitations   _6 Shortness of breath when laying flat   _7 Shortness of breath at rest   _8 Shortness of breath with exertion. Vascular:  _9 Pain in legs with walking   _10 Pain in legs at rest   _11 Pain in legs when laying flat   _12 Claudication   _13 Pain in feet when walking  _14 Pain in feet at rest  _15 Pain in feet when laying flat   _16 History of DVT   _17 Phlebitis   _18 Swelling in legs   _19 Varicose veins   _20 Non-healing ulcers Pulmonary:   _21 Uses home oxygen   _22 Productive cough   _23 Hemoptysis   _24 Wheeze  _25 COPD   _26 Asthma Neurologic:  _27 Dizziness  _28 Blackouts   _29 Seizures   _30 History of stroke   _31 History of TIA  _32 Aphasia   _33 Temporary blindness   _34 Dysphagia   _35 Weakness or numbness in arms   _36 Weakness or numbness in legs Musculoskeletal:  _37 Arthritis   _38 Joint swelling   _39 Joint pain   _40 Low back pain Hematologic:  _41 Easy bruising  _42 Easy bleeding   _43 Hypercoagulable state   _44 Anemic  _45 Hepatitis Gastrointestinal:  _46 Blood in stool   _47 Vomiting blood  _48 Gastroesophageal reflux/heartburn   _49 Abdominal pain Genitourinary:  _50 Chronic kidney disease    _51 Difficult urination  _52 Frequent urination  _53 Burning with urination   _54 Hematuria Skin:  _55 Rashes   _56 Ulcers   _57 Wounds Psychological:  _58 History of anxiety   _59  History of major depression.    Physical Exam BP (!) 155/79 (BP Location: Left Arm)   Pulse 63   Resp 16   Ht 5' 5.5" (1.664 m)   Wt 142 lb (64.4 kg)   BMI 23.27 kg/m  Gen:  WD/WN, NAD Head: [place]/AT, No temporalis wasting.  Ear/Nose/Throat: Hearing grossly intact, nares w/o  erythema or drainage, oropharynx w/o Erythema/Exudate Eyes: Conjunctiva clear, sclera non-icteric  Neck: trachea midline.  No JVD.  Pulmonary:  Good air movement, respirations not labored, no use of accessory muscles  Cardiac: RRR, no JVD Vascular:  Vessel Right Left  Radial Palpable Palpable                          DP 1+ 1+  PT 1+ NP   Gastrointestinal:. No masses, surgical incisions, or scars. Musculoskeletal: M/S 5/5 throughout.  Extremities without ischemic changes.  No deformity or atrophy.  Quarter sized medium depth ulceration on the left heel with mild surrounding erythema.  Trace lower extremity edema. Neurologic: Sensation grossly intact in extremities.  Symmetrical.  Speech is fluent. Motor exam as listed above. Psychiatric: Judgment intact, Mood & affect appropriate for pt's clinical situation. Dermatologic: Left heel ulceration as above    Radiology DG Foot Complete Left  Result Date: 06/10/2020 CLINICAL DATA:  Non healing wound to LEFT heel. EXAM: LEFT FOOT - COMPLETE 3+ VIEW COMPARISON:  02/10/2020 FINDINGS: No fracture, subluxation or dislocation identified. No radiographic evidence of acute osteomyelitis identified. Heavy vascular calcifications are present. No focal bony lesions are noted. IMPRESSION: 1. No acute abnormality. No radiographic evidence of acute osteomyelitis. 2. Heavy vascular calcifications. Electronically Signed   By: Margarette Canada M.D.   On: 06/10/2020 20:15   VAS [place] ABI WITH/WO TBI  Result Date:  06/16/2020 LOWER EXTREMITY DOPPLER STUDY Indications: Peripheral artery disease, and Left heel ulcer.  Performing Technologist: Concha Norway RVT  Examination Guidelines: A complete evaluation includes at minimum, Doppler waveform signals and systolic blood pressure reading at the level of bilateral brachial, anterior tibial, and posterior tibial arteries, when vessel segments are accessible. Bilateral testing is considered an integral part of a complete examination. Photoelectric Plethysmograph (PPG) waveforms and toe systolic pressure readings are included as required and additional duplex testing as needed. Limited examinations for reoccurring indications may be performed as noted.  ABI Findings: +---------+------------------+-----+-------------------+--------+ Right    Rt Pressure (mmHg)IndexWaveform           Comment  +---------+------------------+-----+-------------------+--------+ Brachial 159                                                +---------+------------------+-----+-------------------+--------+ ATA      163               1.03 dampened monophasic         +---------+------------------+-----+-------------------+--------+ PTA      161               1.01 dampened monophasic         +---------+------------------+-----+-------------------+--------+ Great Toe79                0.50 Abnormal                    +---------+------------------+-----+-------------------+--------+ +---------+------------------+-----+-------------------+-------+ Left     Lt Pressure (mmHg)IndexWaveform           Comment +---------+------------------+-----+-------------------+-------+ ATA      78                0.49 dampened monophasic        +---------+------------------+-----+-------------------+-------+ PTA      65                0.41 dampened  monophasic        +---------+------------------+-----+-------------------+-------+ Great Toe35                0.22 Abnormal                    +---------+------------------+-----+-------------------+-------+ +-------+-----------+-----------+------------+------------+ ABI/TBIToday's ABIToday's TBIPrevious ABIPrevious TBI +-------+-----------+-----------+------------+------------+ Right  1.03       .50        .66                      +-------+-----------+-----------+------------+------------+ Left   .49        .22        .66                      +-------+-----------+-----------+------------+------------+  Summary: Right: The right toe-brachial index is abnormal. ABIs are unreliable. Although ankle brachial indices are within normal limits (0.95-1.29), arterial Doppler waveforms at the ankle suggest some component of arterial occlusive disease. Left: Resting left ankle-brachial index indicates severe left lower extremity arterial disease. The left toe-brachial index is abnormal.  *See table(s) above for measurements and observations.  Electronically signed by Leotis Pain MD on 06/16/2020 at 11:51:09 AM.   Final    VAS [place] LOWER EXTREMITY ARTERIAL DUPLEX  Result Date: 06/16/2020 LOWER EXTREMITY ARTERIAL DUPLEX STUDY Indications: Peripheral artery disease.  Current ABI: Right=1.03 & Left=0.49 Performing Technologist: Concha Norway RVT  Examination Guidelines: A complete evaluation includes B-mode imaging, spectral Doppler, color Doppler, and power Doppler as needed of all accessible portions of each vessel. Bilateral testing is considered an integral part of a complete examination. Limited examinations for reoccurring indications may be performed as noted.  +----------+--------+-----+--------+----------+--------+ RIGHT     PSV cm/sRatioStenosisWaveform  Comments +----------+--------+-----+--------+----------+--------+ CFA       63                   monophasic         +----------+--------+-----+--------+----------+--------+ SFA                    occluded                    +----------+--------+-----+--------+----------+--------+ POP       35                   monophasic         +----------+--------+-----+--------+----------+--------+ ATA Distal19                   monophasic         +----------+--------+-----+--------+----------+--------+ PTA Distal23                   monophasic         +----------+--------+-----+--------+----------+--------+  +----------+--------+-----+--------+----------+--------+ LEFT      PSV cm/sRatioStenosisWaveform  Comments +----------+--------+-----+--------+----------+--------+ CFA       103                  monophasic         +----------+--------+-----+--------+----------+--------+ SFA                    occluded                   +----------+--------+-----+--------+----------+--------+ POP       23                   monophasic         +----------+--------+-----+--------+----------+--------+  ATA Distal13                   monophasic         +----------+--------+-----+--------+----------+--------+ PTA Distal17                   monophasic         +----------+--------+-----+--------+----------+--------+  Summary: Right: Total occlusion noted in the superficial femoral artery. Severe amount of atherosclerosis throughout. Flow reconstitutes at the popliteal artery and is dampened monophasic distally. Left: Total occlusion noted in the superficial femoral artery. Severe amount of atherosclerosis throughout. Flow reconstitutes at the popliteal artery and is dampened monophasic distally. Monophasic flow in the bilateral CFA suggestive of significant aortoiliac level arterial disease.  See table(s) above for measurements and observations. Electronically signed by Leotis Pain MD on 06/16/2020 at 11:51:03 AM.    Final     Labs Recent Results (from the past 2160 hour(s))  Bayer DCA Hb A1c Waived     Status: None   Collection Time: 06/02/20  1:44 PM  Result Value Ref Range   HB A1C (BAYER DCA - WAIVED)  6.3 <7.0 %    Comment:                                       Diabetic Adult            <7.0                                       Healthy Adult        4.3 - 5.7                                                           (DCCT/NGSP) American Diabetes Association's Summary of Glycemic Recommendations for Adults with Diabetes: Hemoglobin A1c <7.0%. More stringent glycemic goals (A1c <6.0%) may further reduce complications at the cost of increased risk of hypoglycemia.   Basic metabolic panel     Status: Abnormal   Collection Time: 06/02/20  1:47 PM  Result Value Ref Range   Glucose 153 (H) 65 - 99 mg/dL   BUN 24 8 - 27 mg/dL   Creatinine, Ser 1.16 0.76 - 1.27 mg/dL   GFR calc non Af Amer 66 >59 mL/min/1.73   GFR calc Af Amer 76 >59 mL/min/1.73    Comment: **In accordance with recommendations from the NKF-ASN Task force,**   [facility] is in the process of updating its eGFR calculation to the   2021 CKD-EPI creatinine equation that estimates kidney function   without a race variable.    BUN/Creatinine Ratio 21 10 - 24   Sodium 140 134 - 144 mmol/L   Potassium 4.1 3.5 - 5.2 mmol/L   Chloride 99 96 - 106 mmol/L   CO2 21 20 - 29 mmol/L   Calcium 10.2 8.6 - 10.2 mg/dL  TSH     Status: None   Collection Time: 06/02/20  1:47 PM  Result Value Ref Range   TSH 0.754 0.450 - 4.500 uIU/mL  Lipid Panel w/o Chol/HDL Ratio     Status: Abnormal   Collection Time:  06/02/20  1:47 PM  Result Value Ref Range   Cholesterol, Total 79 (L) 100 - 199 mg/dL   Triglycerides 152 (H) 0 - 149 mg/dL   HDL 27 (L) >39 mg/dL   VLDL Cholesterol Cal 26 5 - 40 mg/dL   LDL Chol Calc (NIH) 26 0 - 99 mg/dL  Vitamin B12     Status: Abnormal   Collection Time: 06/02/20  1:47 PM  Result Value Ref Range   Vitamin B-12 >2000 (H) 232 - 1245 pg/mL  PSA     Status: None   Collection Time: 06/02/20  1:47 PM  Result Value Ref Range   Prostate Specific Ag, Serum 1.2 0.0 - 4.0 ng/mL    Comment: Roche ECLIA  methodology. According to the American Urological Association, Serum PSA should decrease and remain at undetectable levels after radical prostatectomy. The AUA defines biochemical recurrence as an initial PSA value 0.2 ng/mL or greater followed by a subsequent confirmatory PSA value 0.2 ng/mL or greater. Values obtained with different assay methods or kits cannot be used interchangeably. Results cannot be interpreted as absolute evidence of the presence or absence of malignant disease.   CBC with Differential/Platelet     Status: Abnormal   Collection Time: 06/04/20  2:41 PM  Result Value Ref Range   WBC 15.0 (H) 3.4 - 10.8 x10E3/uL   RBC 5.17 4.14 - 5.80 x10E6/uL   Hemoglobin 14.7 13.0 - 17.7 g/dL   Hematocrit 45.6 37.5 - 51.0 %   MCV 88 79 - 97 fL   MCH 28.4 26.6 - 33.0 pg   MCHC 32.2 31.5 - 35.7 g/dL   RDW 12.7 11.6 - 15.4 %   Platelets 320 150 - 450 x10E3/uL   Neutrophils 75 Not Estab. %   Lymphs 14 Not Estab. %   Monocytes 8 Not Estab. %   Eos 2 Not Estab. %   Basos 1 Not Estab. %   Neutrophils Absolute 11.3 (H) 1.4 - 7.0 x10E3/uL   Lymphocytes Absolute 2.1 0.7 - 3.1 x10E3/uL   Monocytes Absolute 1.2 (H) 0.1 - 0.9 x10E3/uL   EOS (ABSOLUTE) 0.3 0.0 - 0.4 x10E3/uL   Basophils Absolute 0.1 0.0 - 0.2 x10E3/uL   Immature Granulocytes 0 Not Estab. %   Immature Grans (Abs) 0.0 0.0 - 0.1 x10E3/uL    Assessment/Plan:  Atherosclerosis of native arteries of the extremities with ulceration (HCC) Noninvasive studies were performed today showing significant disease bilaterally.  Duplex suggest some aortoiliac disease with monophasic flow in both common femoral arteries.  The left SFA is occluded by duplex.  ABIs were 1.0 on the right but may be falsely elevated as the waveforms were monophasic and the digital pressures were reduced.  ABI was 0.49 on the left with a digit pressure of only 35.   Recommend:  The patient has evidence of severe atherosclerotic changes of both lower  extremities associated with ulceration and tissue loss of the foot.  This represents a limb threatening ischemia and places the patient at the risk for limb loss.  Patient should undergo angiography of the lower extremities with the hope for intervention for limb salvage.  The risks and benefits as well as the alternative therapies was discussed in detail with the patient.  All questions were answered.  Patient agrees to proceed with angiography.  The patient will follow up with me in the office after the procedure.    Hypertension associated with diabetes ([place]) blood pressure control important in reducing the progression of atherosclerotic  disease. On appropriate oral medications.   Diabetic ulcer of left heel associated with type 2 diabetes mellitus (HCC) blood glucose control important in reducing the progression of atherosclerotic disease. Also, involved in wound healing. On appropriate medications.   Atherosclerotic heart disease of native coronary artery without angina pectoris Status post previous coronary intervention      Leotis Pain 06/16/2020, 3:23 PM   This note was created with Dragon medical transcription system.  Any errors from dictation are unintentional.

## 2020-06-16 NOTE — H&P (View-Only) (Signed)
Patient ID: Ivan Larsen, male   DOB: [birth year], 65 y.o.   MRN: [id number]  Chief Complaint  Patient presents with  . New Patient (Initial Visit)    Ref Cannady diabetic ulcer with left heel ulcer    HPI Ivan Larsen is a 65 y.o. male.  I am asked to see the patient by J. Cannady for evaluation of a non-healing ulceration of the left foot.  This started about a month ago without any clear cause or inciting event.  There was no local trauma to the area.  He is scheduled to see the wound care center tomorrow.  He has been putting an ointment and keeping a Band-Aid on this ulcer for the past couple of weeks.  It is mildly painful but he has longstanding diabetes and some degree of neuropathy of the lower extremities.  No fevers or chills or signs of systemic infection.  No current right leg symptoms.  He does have some claudication symptoms with activity and has for many years.  No rest pain at night.  Noninvasive studies were performed today showing significant disease bilaterally.  Duplex suggest some aortoiliac disease with monophasic flow in both common femoral arteries.  The left SFA is occluded by duplex.  ABIs were 1.0 on the right but may be falsely elevated as the waveforms were monophasic and the digital pressures were reduced.  ABI was 0.49 on the left with a digit pressure of only 35.   Past Medical History:  Diagnosis Date  . CAD (coronary artery disease)   . Diabetes mellitus without complication ([place])   . Elevated liver enzymes   . History of kidney stones   . Hyperlipidemia   . MI (myocardial infarction) ([place]) 07/01/12  . PAD (peripheral artery disease) ([place])     Past Surgical History:  Procedure Laterality Date  . CORONARY STENT PLACEMENT    . EYE SURGERY       Family History  Problem Relation Age of Onset  . Diabetes Mother   . Heart attack Father   no bleeding or clotting disorders   Social History   Tobacco Use  . Smoking status: Former Smoker     Types: Cigarettes    Quit date: 01/15/2004    Years since quitting: 16.4  . Smokeless tobacco: Never Used  Vaping Use  . Vaping Use: Never used  Substance Use Topics  . Alcohol use: Yes  . Drug use: No    Allergies  Allergen Reactions  . Iodinated Diagnostic Agents Nausea Only    Other reaction(s): Vomiting  . Iodine   . Shellfish Allergy Nausea And Vomiting and Other (See Comments)    Sweating  Sweating    . Shellfish-Derived Products Nausea And Vomiting and Other (See Comments)    Other reaction(s): Nausea And Vomiting, Other (See Comments) Sweating  Sweating     Current Outpatient Medications  Medication Sig Dispense Refill  . amLODipine (NORVASC) 10 MG tablet Take 1 tablet (10 mg total) by mouth daily. 90 tablet 1  . atorvastatin (LIPITOR) 40 MG tablet Take 1 tablet (40 mg total) by mouth daily. 90 tablet 4  . benazepril (LOTENSIN) 40 MG tablet Take 1 tablet (40 mg total) by mouth daily. 90 tablet 1  . clopidogrel (PLAVIX) 75 MG tablet Take 1 tablet (75 mg total) by mouth daily. 90 tablet 1  . collagenase (SANTYL) ointment Apply 1 application topically daily. 15 g 0  . empagliflozin (JARDIANCE) 25 MG TABS tablet Take  1 tablet (25 mg total) by mouth daily before breakfast. 90 tablet 1  . gabapentin (NEURONTIN) 300 MG capsule Take 1 capsule (300 mg total) by mouth 3 (three) times daily. 270 capsule 4  . hydrochlorothiazide (HYDRODIURIL) 25 MG tablet Take 1 tablet (25 mg total) by mouth daily. 90 tablet 1  . meclizine (ANTIVERT) 25 MG tablet Take 1 tablet (25 mg total) by mouth 3 (three) times daily as needed for dizziness or nausea. 30 tablet 1  . metFORMIN (GLUCOPHAGE) 500 MG tablet Take 2 tablets (1,000 mg total) by mouth 2 (two) times daily with a meal. 360 tablet 1  . metoprolol tartrate (LOPRESSOR) 50 MG tablet Take 1 tablet (50 mg total) by mouth 2 (two) times daily. 180 tablet 1  . mupirocin ointment (BACTROBAN) 2 % Apply 1 application topically 2 (two) times daily. 22  g 1  . Omega-3 Fatty Acids (FISH OIL OMEGA-3) 1000 MG CAPS Take by mouth 2 (two) times daily.    . ondansetron (ZOFRAN-ODT) 4 MG disintegrating tablet 1 TABLET ORALLY DISSOLVED EVERY 8 HOURS IF NEEDED FOR NAUSEA AND VOMITING 21 tablet 0  . sitaGLIPtin (JANUVIA) 25 MG tablet Take 1 tablet (25 mg total) by mouth daily. 90 tablet 1  . tamsulosin (FLOMAX) 0.4 MG CAPS capsule Take 1 capsule (0.4 mg total) by mouth at bedtime. Take at nighttime to minimize dizziness/hypotension 90 capsule 1  . doxycycline (VIBRA-TABS) 100 MG tablet Take 1 tablet (100 mg total) by mouth 2 (two) times daily. (Patient not taking: Reported on 06/16/2020) 20 tablet 0   No current facility-administered medications for this visit.      REVIEW OF SYSTEMS (Negative unless checked)  Constitutional: _0 Weight loss  _1 Fever  _2 Chills Cardiac: _3 Chest pain   _4 Chest pressure   _5 Palpitations   _6 Shortness of breath when laying flat   _7 Shortness of breath at rest   _8 Shortness of breath with exertion. Vascular:  _9 Pain in legs with walking   _10 Pain in legs at rest   _11 Pain in legs when laying flat   _12 Claudication   _13 Pain in feet when walking  _14 Pain in feet at rest  _15 Pain in feet when laying flat   _16 History of DVT   _17 Phlebitis   _18 Swelling in legs   _19 Varicose veins   _20 Non-healing ulcers Pulmonary:   _21 Uses home oxygen   _22 Productive cough   _23 Hemoptysis   _24 Wheeze  _25 COPD   _26 Asthma Neurologic:  _27 Dizziness  _28 Blackouts   _29 Seizures   _30 History of stroke   _31 History of TIA  _32 Aphasia   _33 Temporary blindness   _34 Dysphagia   _35 Weakness or numbness in arms   _36 Weakness or numbness in legs Musculoskeletal:  _37 Arthritis   _38 Joint swelling   _39 Joint pain   _40 Low back pain Hematologic:  _41 Easy bruising  _42 Easy bleeding   _43 Hypercoagulable state   _44 Anemic  _45 Hepatitis Gastrointestinal:  _46 Blood in stool   _47 Vomiting blood  _48 Gastroesophageal reflux/heartburn   _49 Abdominal pain Genitourinary:  _50 Chronic kidney disease    _51 Difficult urination  _52 Frequent urination  _53 Burning with urination   _54 Hematuria Skin:  _55 Rashes   _56 Ulcers   _57 Wounds Psychological:  _58 History of anxiety   _59  History of major depression.    Physical Exam BP (!) 155/79 (BP Location: Left Arm)   Pulse 63   Resp 16   Ht 5' 5.5" (1.664 m)   Wt 142 lb (64.4 kg)   BMI 23.27 kg/m  Gen:  WD/WN, NAD Head: [place]/AT, No temporalis wasting.  Ear/Nose/Throat: Hearing grossly intact, nares w/o  erythema or drainage, oropharynx w/o Erythema/Exudate Eyes: Conjunctiva clear, sclera non-icteric  Neck: trachea midline.  No JVD.  Pulmonary:  Good air movement, respirations not labored, no use of accessory muscles  Cardiac: RRR, no JVD Vascular:  Vessel Right Left  Radial Palpable Palpable                          DP 1+ 1+  PT 1+ NP   Gastrointestinal:. No masses, surgical incisions, or scars. Musculoskeletal: M/S 5/5 throughout.  Extremities without ischemic changes.  No deformity or atrophy.  Quarter sized medium depth ulceration on the left heel with mild surrounding erythema.  Trace lower extremity edema. Neurologic: Sensation grossly intact in extremities.  Symmetrical.  Speech is fluent. Motor exam as listed above. Psychiatric: Judgment intact, Mood & affect appropriate for pt's clinical situation. Dermatologic: Left heel ulceration as above    Radiology DG Foot Complete Left  Result Date: 06/10/2020 CLINICAL DATA:  Non healing wound to LEFT heel. EXAM: LEFT FOOT - COMPLETE 3+ VIEW COMPARISON:  02/10/2020 FINDINGS: No fracture, subluxation or dislocation identified. No radiographic evidence of acute osteomyelitis identified. Heavy vascular calcifications are present. No focal bony lesions are noted. IMPRESSION: 1. No acute abnormality. No radiographic evidence of acute osteomyelitis. 2. Heavy vascular calcifications. Electronically Signed   By: Margarette Canada M.D.   On: 06/10/2020 20:15   VAS [place] ABI WITH/WO TBI  Result Date:  06/16/2020 LOWER EXTREMITY DOPPLER STUDY Indications: Peripheral artery disease, and Left heel ulcer.  Performing Technologist: Concha Norway RVT  Examination Guidelines: A complete evaluation includes at minimum, Doppler waveform signals and systolic blood pressure reading at the level of bilateral brachial, anterior tibial, and posterior tibial arteries, when vessel segments are accessible. Bilateral testing is considered an integral part of a complete examination. Photoelectric Plethysmograph (PPG) waveforms and toe systolic pressure readings are included as required and additional duplex testing as needed. Limited examinations for reoccurring indications may be performed as noted.  ABI Findings: +---------+------------------+-----+-------------------+--------+ Right    Rt Pressure (mmHg)IndexWaveform           Comment  +---------+------------------+-----+-------------------+--------+ Brachial 159                                                +---------+------------------+-----+-------------------+--------+ ATA      163               1.03 dampened monophasic         +---------+------------------+-----+-------------------+--------+ PTA      161               1.01 dampened monophasic         +---------+------------------+-----+-------------------+--------+ Great Toe79                0.50 Abnormal                    +---------+------------------+-----+-------------------+--------+ +---------+------------------+-----+-------------------+-------+ Left     Lt Pressure (mmHg)IndexWaveform           Comment +---------+------------------+-----+-------------------+-------+ ATA      78                0.49 dampened monophasic        +---------+------------------+-----+-------------------+-------+ PTA      65                0.41 dampened  monophasic        +---------+------------------+-----+-------------------+-------+ Great Toe35                0.22 Abnormal                    +---------+------------------+-----+-------------------+-------+ +-------+-----------+-----------+------------+------------+ ABI/TBIToday's ABIToday's TBIPrevious ABIPrevious TBI +-------+-----------+-----------+------------+------------+ Right  1.03       .50        .66                      +-------+-----------+-----------+------------+------------+ Left   .49        .22        .66                      +-------+-----------+-----------+------------+------------+  Summary: Right: The right toe-brachial index is abnormal. ABIs are unreliable. Although ankle brachial indices are within normal limits (0.95-1.29), arterial Doppler waveforms at the ankle suggest some component of arterial occlusive disease. Left: Resting left ankle-brachial index indicates severe left lower extremity arterial disease. The left toe-brachial index is abnormal.  *See table(s) above for measurements and observations.  Electronically signed by Leotis Pain MD on 06/16/2020 at 11:51:09 AM.   Final    VAS [place] LOWER EXTREMITY ARTERIAL DUPLEX  Result Date: 06/16/2020 LOWER EXTREMITY ARTERIAL DUPLEX STUDY Indications: Peripheral artery disease.  Current ABI: Right=1.03 & Left=0.49 Performing Technologist: Concha Norway RVT  Examination Guidelines: A complete evaluation includes B-mode imaging, spectral Doppler, color Doppler, and power Doppler as needed of all accessible portions of each vessel. Bilateral testing is considered an integral part of a complete examination. Limited examinations for reoccurring indications may be performed as noted.  +----------+--------+-----+--------+----------+--------+ RIGHT     PSV cm/sRatioStenosisWaveform  Comments +----------+--------+-----+--------+----------+--------+ CFA       63                   monophasic         +----------+--------+-----+--------+----------+--------+ SFA                    occluded                    +----------+--------+-----+--------+----------+--------+ POP       35                   monophasic         +----------+--------+-----+--------+----------+--------+ ATA Distal19                   monophasic         +----------+--------+-----+--------+----------+--------+ PTA Distal23                   monophasic         +----------+--------+-----+--------+----------+--------+  +----------+--------+-----+--------+----------+--------+ LEFT      PSV cm/sRatioStenosisWaveform  Comments +----------+--------+-----+--------+----------+--------+ CFA       103                  monophasic         +----------+--------+-----+--------+----------+--------+ SFA                    occluded                   +----------+--------+-----+--------+----------+--------+ POP       23                   monophasic         +----------+--------+-----+--------+----------+--------+  ATA Distal13                   monophasic         +----------+--------+-----+--------+----------+--------+ PTA Distal17                   monophasic         +----------+--------+-----+--------+----------+--------+  Summary: Right: Total occlusion noted in the superficial femoral artery. Severe amount of atherosclerosis throughout. Flow reconstitutes at the popliteal artery and is dampened monophasic distally. Left: Total occlusion noted in the superficial femoral artery. Severe amount of atherosclerosis throughout. Flow reconstitutes at the popliteal artery and is dampened monophasic distally. Monophasic flow in the bilateral CFA suggestive of significant aortoiliac level arterial disease.  See table(s) above for measurements and observations. Electronically signed by Leotis Pain MD on 06/16/2020 at 11:51:03 AM.    Final     Labs Recent Results (from the past 2160 hour(s))  Bayer DCA Hb A1c Waived     Status: None   Collection Time: 06/02/20  1:44 PM  Result Value Ref Range   HB A1C (BAYER DCA - WAIVED)  6.3 <7.0 %    Comment:                                       Diabetic Adult            <7.0                                       Healthy Adult        4.3 - 5.7                                                           (DCCT/NGSP) American Diabetes Association's Summary of Glycemic Recommendations for Adults with Diabetes: Hemoglobin A1c <7.0%. More stringent glycemic goals (A1c <6.0%) may further reduce complications at the cost of increased risk of hypoglycemia.   Basic metabolic panel     Status: Abnormal   Collection Time: 06/02/20  1:47 PM  Result Value Ref Range   Glucose 153 (H) 65 - 99 mg/dL   BUN 24 8 - 27 mg/dL   Creatinine, Ser 1.16 0.76 - 1.27 mg/dL   GFR calc non Af Amer 66 >59 mL/min/1.73   GFR calc Af Amer 76 >59 mL/min/1.73    Comment: **In accordance with recommendations from the NKF-ASN Task force,**   [facility] is in the process of updating its eGFR calculation to the   2021 CKD-EPI creatinine equation that estimates kidney function   without a race variable.    BUN/Creatinine Ratio 21 10 - 24   Sodium 140 134 - 144 mmol/L   Potassium 4.1 3.5 - 5.2 mmol/L   Chloride 99 96 - 106 mmol/L   CO2 21 20 - 29 mmol/L   Calcium 10.2 8.6 - 10.2 mg/dL  TSH     Status: None   Collection Time: 06/02/20  1:47 PM  Result Value Ref Range   TSH 0.754 0.450 - 4.500 uIU/mL  Lipid Panel w/o Chol/HDL Ratio     Status: Abnormal   Collection Time:  06/02/20  1:47 PM  Result Value Ref Range   Cholesterol, Total 79 (L) 100 - 199 mg/dL   Triglycerides 152 (H) 0 - 149 mg/dL   HDL 27 (L) >39 mg/dL   VLDL Cholesterol Cal 26 5 - 40 mg/dL   LDL Chol Calc (NIH) 26 0 - 99 mg/dL  Vitamin B12     Status: Abnormal   Collection Time: 06/02/20  1:47 PM  Result Value Ref Range   Vitamin B-12 >2000 (H) 232 - 1245 pg/mL  PSA     Status: None   Collection Time: 06/02/20  1:47 PM  Result Value Ref Range   Prostate Specific Ag, Serum 1.2 0.0 - 4.0 ng/mL    Comment: Roche ECLIA  methodology. According to the American Urological Association, Serum PSA should decrease and remain at undetectable levels after radical prostatectomy. The AUA defines biochemical recurrence as an initial PSA value 0.2 ng/mL or greater followed by a subsequent confirmatory PSA value 0.2 ng/mL or greater. Values obtained with different assay methods or kits cannot be used interchangeably. Results cannot be interpreted as absolute evidence of the presence or absence of malignant disease.   CBC with Differential/Platelet     Status: Abnormal   Collection Time: 06/04/20  2:41 PM  Result Value Ref Range   WBC 15.0 (H) 3.4 - 10.8 x10E3/uL   RBC 5.17 4.14 - 5.80 x10E6/uL   Hemoglobin 14.7 13.0 - 17.7 g/dL   Hematocrit 45.6 37.5 - 51.0 %   MCV 88 79 - 97 fL   MCH 28.4 26.6 - 33.0 pg   MCHC 32.2 31.5 - 35.7 g/dL   RDW 12.7 11.6 - 15.4 %   Platelets 320 150 - 450 x10E3/uL   Neutrophils 75 Not Estab. %   Lymphs 14 Not Estab. %   Monocytes 8 Not Estab. %   Eos 2 Not Estab. %   Basos 1 Not Estab. %   Neutrophils Absolute 11.3 (H) 1.4 - 7.0 x10E3/uL   Lymphocytes Absolute 2.1 0.7 - 3.1 x10E3/uL   Monocytes Absolute 1.2 (H) 0.1 - 0.9 x10E3/uL   EOS (ABSOLUTE) 0.3 0.0 - 0.4 x10E3/uL   Basophils Absolute 0.1 0.0 - 0.2 x10E3/uL   Immature Granulocytes 0 Not Estab. %   Immature Grans (Abs) 0.0 0.0 - 0.1 x10E3/uL    Assessment/Plan:  Atherosclerosis of native arteries of the extremities with ulceration (HCC) Noninvasive studies were performed today showing significant disease bilaterally.  Duplex suggest some aortoiliac disease with monophasic flow in both common femoral arteries.  The left SFA is occluded by duplex.  ABIs were 1.0 on the right but may be falsely elevated as the waveforms were monophasic and the digital pressures were reduced.  ABI was 0.49 on the left with a digit pressure of only 35.   Recommend:  The patient has evidence of severe atherosclerotic changes of both lower  extremities associated with ulceration and tissue loss of the foot.  This represents a limb threatening ischemia and places the patient at the risk for limb loss.  Patient should undergo angiography of the lower extremities with the hope for intervention for limb salvage.  The risks and benefits as well as the alternative therapies was discussed in detail with the patient.  All questions were answered.  Patient agrees to proceed with angiography.  The patient will follow up with me in the office after the procedure.    Hypertension associated with diabetes ([place]) blood pressure control important in reducing the progression of atherosclerotic  disease. On appropriate oral medications.   Diabetic ulcer of left heel associated with type 2 diabetes mellitus (HCC) blood glucose control important in reducing the progression of atherosclerotic disease. Also, involved in wound healing. On appropriate medications.   Atherosclerotic heart disease of native coronary artery without angina pectoris Status post previous coronary intervention      Leotis Pain 06/16/2020, 3:23 PM   This note was created with Dragon medical transcription system.  Any errors from dictation are unintentional.

## 2020-06-16 NOTE — H&P (View-Only) (Signed)
Patient ID: Ivan Larsen, male   DOB: [birth year], 65 y.o.   MRN: [id number]  Chief Complaint  Patient presents with  . New Patient (Initial Visit)    Ref Cannady diabetic ulcer with left heel ulcer    HPI IVO MOGA is a 65 y.o. male.  I am asked to see the patient by J. Cannady for evaluation of a non-healing ulceration of the left foot.  This started about a month ago without any clear cause or inciting event.  There was no local trauma to the area.  He is scheduled to see the wound care center tomorrow.  He has been putting an ointment and keeping a Band-Aid on this ulcer for the past couple of weeks.  It is mildly painful but he has longstanding diabetes and some degree of neuropathy of the lower extremities.  No fevers or chills or signs of systemic infection.  No current right leg symptoms.  He does have some claudication symptoms with activity and has for many years.  No rest pain at night.  Noninvasive studies were performed today showing significant disease bilaterally.  Duplex suggest some aortoiliac disease with monophasic flow in both common femoral arteries.  The left SFA is occluded by duplex.  ABIs were 1.0 on the right but may be falsely elevated as the waveforms were monophasic and the digital pressures were reduced.  ABI was 0.49 on the left with a digit pressure of only 35.   Past Medical History:  Diagnosis Date  . CAD (coronary artery disease)   . Diabetes mellitus without complication ([place])   . Elevated liver enzymes   . History of kidney stones   . Hyperlipidemia   . MI (myocardial infarction) ([place]) 07/01/12  . PAD (peripheral artery disease) ([place])     Past Surgical History:  Procedure Laterality Date  . CORONARY STENT PLACEMENT    . EYE SURGERY       Family History  Problem Relation Age of Onset  . Diabetes Mother   . Heart attack Father   no bleeding or clotting disorders   Social History   Tobacco Use  . Smoking status: Former Smoker     Types: Cigarettes    Quit date: 01/15/2004    Years since quitting: 16.4  . Smokeless tobacco: Never Used  Vaping Use  . Vaping Use: Never used  Substance Use Topics  . Alcohol use: Yes  . Drug use: No    Allergies  Allergen Reactions  . Iodinated Diagnostic Agents Nausea Only    Other reaction(s): Vomiting  . Iodine   . Shellfish Allergy Nausea And Vomiting and Other (See Comments)    Sweating  Sweating    . Shellfish-Derived Products Nausea And Vomiting and Other (See Comments)    Other reaction(s): Nausea And Vomiting, Other (See Comments) Sweating  Sweating     Current Outpatient Medications  Medication Sig Dispense Refill  . amLODipine (NORVASC) 10 MG tablet Take 1 tablet (10 mg total) by mouth daily. 90 tablet 1  . atorvastatin (LIPITOR) 40 MG tablet Take 1 tablet (40 mg total) by mouth daily. 90 tablet 4  . benazepril (LOTENSIN) 40 MG tablet Take 1 tablet (40 mg total) by mouth daily. 90 tablet 1  . clopidogrel (PLAVIX) 75 MG tablet Take 1 tablet (75 mg total) by mouth daily. 90 tablet 1  . collagenase (SANTYL) ointment Apply 1 application topically daily. 15 g 0  . empagliflozin (JARDIANCE) 25 MG TABS tablet Take  1 tablet (25 mg total) by mouth daily before breakfast. 90 tablet 1  . gabapentin (NEURONTIN) 300 MG capsule Take 1 capsule (300 mg total) by mouth 3 (three) times daily. 270 capsule 4  . hydrochlorothiazide (HYDRODIURIL) 25 MG tablet Take 1 tablet (25 mg total) by mouth daily. 90 tablet 1  . meclizine (ANTIVERT) 25 MG tablet Take 1 tablet (25 mg total) by mouth 3 (three) times daily as needed for dizziness or nausea. 30 tablet 1  . metFORMIN (GLUCOPHAGE) 500 MG tablet Take 2 tablets (1,000 mg total) by mouth 2 (two) times daily with a meal. 360 tablet 1  . metoprolol tartrate (LOPRESSOR) 50 MG tablet Take 1 tablet (50 mg total) by mouth 2 (two) times daily. 180 tablet 1  . mupirocin ointment (BACTROBAN) 2 % Apply 1 application topically 2 (two) times daily. 22  g 1  . Omega-3 Fatty Acids (FISH OIL OMEGA-3) 1000 MG CAPS Take by mouth 2 (two) times daily.    . ondansetron (ZOFRAN-ODT) 4 MG disintegrating tablet 1 TABLET ORALLY DISSOLVED EVERY 8 HOURS IF NEEDED FOR NAUSEA AND VOMITING 21 tablet 0  . sitaGLIPtin (JANUVIA) 25 MG tablet Take 1 tablet (25 mg total) by mouth daily. 90 tablet 1  . tamsulosin (FLOMAX) 0.4 MG CAPS capsule Take 1 capsule (0.4 mg total) by mouth at bedtime. Take at nighttime to minimize dizziness/hypotension 90 capsule 1  . doxycycline (VIBRA-TABS) 100 MG tablet Take 1 tablet (100 mg total) by mouth 2 (two) times daily. (Patient not taking: Reported on 06/16/2020) 20 tablet 0   No current facility-administered medications for this visit.      REVIEW OF SYSTEMS (Negative unless checked)  Constitutional: _0 Weight loss  _1 Fever  _2 Chills Cardiac: _3 Chest pain   _4 Chest pressure   _5 Palpitations   _6 Shortness of breath when laying flat   _7 Shortness of breath at rest   _8 Shortness of breath with exertion. Vascular:  _9 Pain in legs with walking   _10 Pain in legs at rest   _11 Pain in legs when laying flat   _12 Claudication   _13 Pain in feet when walking  _14 Pain in feet at rest  _15 Pain in feet when laying flat   _16 History of DVT   _17 Phlebitis   _18 Swelling in legs   _19 Varicose veins   _20 Non-healing ulcers Pulmonary:   _21 Uses home oxygen   _22 Productive cough   _23 Hemoptysis   _24 Wheeze  _25 COPD   _26 Asthma Neurologic:  _27 Dizziness  _28 Blackouts   _29 Seizures   _30 History of stroke   _31 History of TIA  _32 Aphasia   _33 Temporary blindness   _34 Dysphagia   _35 Weakness or numbness in arms   _36 Weakness or numbness in legs Musculoskeletal:  _37 Arthritis   _38 Joint swelling   _39 Joint pain   _40 Low back pain Hematologic:  _41 Easy bruising  _42 Easy bleeding   _43 Hypercoagulable state   _44 Anemic  _45 Hepatitis Gastrointestinal:  _46 Blood in stool   _47 Vomiting blood  _48 Gastroesophageal reflux/heartburn   _49 Abdominal pain Genitourinary:  _50 Chronic kidney disease    _51 Difficult urination  _52 Frequent urination  _53 Burning with urination   _54 Hematuria Skin:  _55 Rashes   _56 Ulcers   _57 Wounds Psychological:  _58 History of anxiety   _59  History of major depression.    Physical Exam BP (!) 155/79 (BP Location: Left Arm)   Pulse 63   Resp 16   Ht 5' 5.5" (1.664 m)   Wt 142 lb (64.4 kg)   BMI 23.27 kg/m  Gen:  WD/WN, NAD Head: [place]/AT, No temporalis wasting.  Ear/Nose/Throat: Hearing grossly intact, nares w/o  erythema or drainage, oropharynx w/o Erythema/Exudate Eyes: Conjunctiva clear, sclera non-icteric  Neck: trachea midline.  No JVD.  Pulmonary:  Good air movement, respirations not labored, no use of accessory muscles  Cardiac: RRR, no JVD Vascular:  Vessel Right Left  Radial Palpable Palpable                          DP 1+ 1+  PT 1+ NP   Gastrointestinal:. No masses, surgical incisions, or scars. Musculoskeletal: M/S 5/5 throughout.  Extremities without ischemic changes.  No deformity or atrophy.  Quarter sized medium depth ulceration on the left heel with mild surrounding erythema.  Trace lower extremity edema. Neurologic: Sensation grossly intact in extremities.  Symmetrical.  Speech is fluent. Motor exam as listed above. Psychiatric: Judgment intact, Mood & affect appropriate for pt's clinical situation. Dermatologic: Left heel ulceration as above    Radiology DG Foot Complete Left  Result Date: 06/10/2020 CLINICAL DATA:  Non healing wound to LEFT heel. EXAM: LEFT FOOT - COMPLETE 3+ VIEW COMPARISON:  02/10/2020 FINDINGS: No fracture, subluxation or dislocation identified. No radiographic evidence of acute osteomyelitis identified. Heavy vascular calcifications are present. No focal bony lesions are noted. IMPRESSION: 1. No acute abnormality. No radiographic evidence of acute osteomyelitis. 2. Heavy vascular calcifications. Electronically Signed   By: Margarette Canada M.D.   On: 06/10/2020 20:15   VAS [place] ABI WITH/WO TBI  Result Date:  06/16/2020 LOWER EXTREMITY DOPPLER STUDY Indications: Peripheral artery disease, and Left heel ulcer.  Performing Technologist: Concha Norway RVT  Examination Guidelines: A complete evaluation includes at minimum, Doppler waveform signals and systolic blood pressure reading at the level of bilateral brachial, anterior tibial, and posterior tibial arteries, when vessel segments are accessible. Bilateral testing is considered an integral part of a complete examination. Photoelectric Plethysmograph (PPG) waveforms and toe systolic pressure readings are included as required and additional duplex testing as needed. Limited examinations for reoccurring indications may be performed as noted.  ABI Findings: +---------+------------------+-----+-------------------+--------+ Right    Rt Pressure (mmHg)IndexWaveform           Comment  +---------+------------------+-----+-------------------+--------+ Brachial 159                                                +---------+------------------+-----+-------------------+--------+ ATA      163               1.03 dampened monophasic         +---------+------------------+-----+-------------------+--------+ PTA      161               1.01 dampened monophasic         +---------+------------------+-----+-------------------+--------+ Great Toe79                0.50 Abnormal                    +---------+------------------+-----+-------------------+--------+ +---------+------------------+-----+-------------------+-------+ Left     Lt Pressure (mmHg)IndexWaveform           Comment +---------+------------------+-----+-------------------+-------+ ATA      78                0.49 dampened monophasic        +---------+------------------+-----+-------------------+-------+ PTA      65                0.41 dampened  monophasic        +---------+------------------+-----+-------------------+-------+ Great Toe35                0.22 Abnormal                    +---------+------------------+-----+-------------------+-------+ +-------+-----------+-----------+------------+------------+ ABI/TBIToday's ABIToday's TBIPrevious ABIPrevious TBI +-------+-----------+-----------+------------+------------+ Right  1.03       .50        .66                      +-------+-----------+-----------+------------+------------+ Left   .49        .22        .66                      +-------+-----------+-----------+------------+------------+  Summary: Right: The right toe-brachial index is abnormal. ABIs are unreliable. Although ankle brachial indices are within normal limits (0.95-1.29), arterial Doppler waveforms at the ankle suggest some component of arterial occlusive disease. Left: Resting left ankle-brachial index indicates severe left lower extremity arterial disease. The left toe-brachial index is abnormal.  *See table(s) above for measurements and observations.  Electronically signed by Leotis Pain MD on 06/16/2020 at 11:51:09 AM.   Final    VAS [place] LOWER EXTREMITY ARTERIAL DUPLEX  Result Date: 06/16/2020 LOWER EXTREMITY ARTERIAL DUPLEX STUDY Indications: Peripheral artery disease.  Current ABI: Right=1.03 & Left=0.49 Performing Technologist: Concha Norway RVT  Examination Guidelines: A complete evaluation includes B-mode imaging, spectral Doppler, color Doppler, and power Doppler as needed of all accessible portions of each vessel. Bilateral testing is considered an integral part of a complete examination. Limited examinations for reoccurring indications may be performed as noted.  +----------+--------+-----+--------+----------+--------+ RIGHT     PSV cm/sRatioStenosisWaveform  Comments +----------+--------+-----+--------+----------+--------+ CFA       63                   monophasic         +----------+--------+-----+--------+----------+--------+ SFA                    occluded                    +----------+--------+-----+--------+----------+--------+ POP       35                   monophasic         +----------+--------+-----+--------+----------+--------+ ATA Distal19                   monophasic         +----------+--------+-----+--------+----------+--------+ PTA Distal23                   monophasic         +----------+--------+-----+--------+----------+--------+  +----------+--------+-----+--------+----------+--------+ LEFT      PSV cm/sRatioStenosisWaveform  Comments +----------+--------+-----+--------+----------+--------+ CFA       103                  monophasic         +----------+--------+-----+--------+----------+--------+ SFA                    occluded                   +----------+--------+-----+--------+----------+--------+ POP       23                   monophasic         +----------+--------+-----+--------+----------+--------+  ATA Distal13                   monophasic         +----------+--------+-----+--------+----------+--------+ PTA Distal17                   monophasic         +----------+--------+-----+--------+----------+--------+  Summary: Right: Total occlusion noted in the superficial femoral artery. Severe amount of atherosclerosis throughout. Flow reconstitutes at the popliteal artery and is dampened monophasic distally. Left: Total occlusion noted in the superficial femoral artery. Severe amount of atherosclerosis throughout. Flow reconstitutes at the popliteal artery and is dampened monophasic distally. Monophasic flow in the bilateral CFA suggestive of significant aortoiliac level arterial disease.  See table(s) above for measurements and observations. Electronically signed by Leotis Pain MD on 06/16/2020 at 11:51:03 AM.    Final     Labs Recent Results (from the past 2160 hour(s))  Bayer DCA Hb A1c Waived     Status: None   Collection Time: 06/02/20  1:44 PM  Result Value Ref Range   HB A1C (BAYER DCA - WAIVED)  6.3 <7.0 %    Comment:                                       Diabetic Adult            <7.0                                       Healthy Adult        4.3 - 5.7                                                           (DCCT/NGSP) American Diabetes Association's Summary of Glycemic Recommendations for Adults with Diabetes: Hemoglobin A1c <7.0%. More stringent glycemic goals (A1c <6.0%) may further reduce complications at the cost of increased risk of hypoglycemia.   Basic metabolic panel     Status: Abnormal   Collection Time: 06/02/20  1:47 PM  Result Value Ref Range   Glucose 153 (H) 65 - 99 mg/dL   BUN 24 8 - 27 mg/dL   Creatinine, Ser 1.16 0.76 - 1.27 mg/dL   GFR calc non Af Amer 66 >59 mL/min/1.73   GFR calc Af Amer 76 >59 mL/min/1.73    Comment: **In accordance with recommendations from the NKF-ASN Task force,**   [facility] is in the process of updating its eGFR calculation to the   2021 CKD-EPI creatinine equation that estimates kidney function   without a race variable.    BUN/Creatinine Ratio 21 10 - 24   Sodium 140 134 - 144 mmol/L   Potassium 4.1 3.5 - 5.2 mmol/L   Chloride 99 96 - 106 mmol/L   CO2 21 20 - 29 mmol/L   Calcium 10.2 8.6 - 10.2 mg/dL  TSH     Status: None   Collection Time: 06/02/20  1:47 PM  Result Value Ref Range   TSH 0.754 0.450 - 4.500 uIU/mL  Lipid Panel w/o Chol/HDL Ratio     Status: Abnormal   Collection Time:  06/02/20  1:47 PM  Result Value Ref Range   Cholesterol, Total 79 (L) 100 - 199 mg/dL   Triglycerides 152 (H) 0 - 149 mg/dL   HDL 27 (L) >39 mg/dL   VLDL Cholesterol Cal 26 5 - 40 mg/dL   LDL Chol Calc (NIH) 26 0 - 99 mg/dL  Vitamin B12     Status: Abnormal   Collection Time: 06/02/20  1:47 PM  Result Value Ref Range   Vitamin B-12 >2000 (H) 232 - 1245 pg/mL  PSA     Status: None   Collection Time: 06/02/20  1:47 PM  Result Value Ref Range   Prostate Specific Ag, Serum 1.2 0.0 - 4.0 ng/mL    Comment: Roche ECLIA  methodology. According to the American Urological Association, Serum PSA should decrease and remain at undetectable levels after radical prostatectomy. The AUA defines biochemical recurrence as an initial PSA value 0.2 ng/mL or greater followed by a subsequent confirmatory PSA value 0.2 ng/mL or greater. Values obtained with different assay methods or kits cannot be used interchangeably. Results cannot be interpreted as absolute evidence of the presence or absence of malignant disease.   CBC with Differential/Platelet     Status: Abnormal   Collection Time: 06/04/20  2:41 PM  Result Value Ref Range   WBC 15.0 (H) 3.4 - 10.8 x10E3/uL   RBC 5.17 4.14 - 5.80 x10E6/uL   Hemoglobin 14.7 13.0 - 17.7 g/dL   Hematocrit 45.6 37.5 - 51.0 %   MCV 88 79 - 97 fL   MCH 28.4 26.6 - 33.0 pg   MCHC 32.2 31.5 - 35.7 g/dL   RDW 12.7 11.6 - 15.4 %   Platelets 320 150 - 450 x10E3/uL   Neutrophils 75 Not Estab. %   Lymphs 14 Not Estab. %   Monocytes 8 Not Estab. %   Eos 2 Not Estab. %   Basos 1 Not Estab. %   Neutrophils Absolute 11.3 (H) 1.4 - 7.0 x10E3/uL   Lymphocytes Absolute 2.1 0.7 - 3.1 x10E3/uL   Monocytes Absolute 1.2 (H) 0.1 - 0.9 x10E3/uL   EOS (ABSOLUTE) 0.3 0.0 - 0.4 x10E3/uL   Basophils Absolute 0.1 0.0 - 0.2 x10E3/uL   Immature Granulocytes 0 Not Estab. %   Immature Grans (Abs) 0.0 0.0 - 0.1 x10E3/uL    Assessment/Plan:  Atherosclerosis of native arteries of the extremities with ulceration (HCC) Noninvasive studies were performed today showing significant disease bilaterally.  Duplex suggest some aortoiliac disease with monophasic flow in both common femoral arteries.  The left SFA is occluded by duplex.  ABIs were 1.0 on the right but may be falsely elevated as the waveforms were monophasic and the digital pressures were reduced.  ABI was 0.49 on the left with a digit pressure of only 35.   Recommend:  The patient has evidence of severe atherosclerotic changes of both lower  extremities associated with ulceration and tissue loss of the foot.  This represents a limb threatening ischemia and places the patient at the risk for limb loss.  Patient should undergo angiography of the lower extremities with the hope for intervention for limb salvage.  The risks and benefits as well as the alternative therapies was discussed in detail with the patient.  All questions were answered.  Patient agrees to proceed with angiography.  The patient will follow up with me in the office after the procedure.    Hypertension associated with diabetes ([place]) blood pressure control important in reducing the progression of atherosclerotic  disease. On appropriate oral medications.   Diabetic ulcer of left heel associated with type 2 diabetes mellitus (HCC) blood glucose control important in reducing the progression of atherosclerotic disease. Also, involved in wound healing. On appropriate medications.   Atherosclerotic heart disease of native coronary artery without angina pectoris Status post previous coronary intervention      Leotis Pain 06/16/2020, 3:23 PM   This note was created with Dragon medical transcription system.  Any errors from dictation are unintentional.

## 2020-06-16 NOTE — Assessment & Plan Note (Signed)
Noninvasive studies were performed today showing significant disease bilaterally.  Duplex suggest some aortoiliac disease with monophasic flow in both common femoral arteries.  The left SFA is occluded by duplex.  ABIs were 1.0 on the right but may be falsely elevated as the waveforms were monophasic and the digital pressures were reduced.  ABI was 0.49 on the left with a digit pressure of only 35.   Recommend:  The patient has evidence of severe atherosclerotic changes of both lower extremities associated with ulceration and tissue loss of the foot.  This represents a limb threatening ischemia and places the patient at the risk for limb loss.  Patient should undergo angiography of the lower extremities with the hope for intervention for limb salvage.  The risks and benefits as well as the alternative therapies was discussed in detail with the patient.  All questions were answered.  Patient agrees to proceed with angiography.  The patient will follow up with me in the office after the procedure.

## 2020-06-16 NOTE — Assessment & Plan Note (Signed)
Status post previous coronary intervention

## 2020-06-16 NOTE — Assessment & Plan Note (Signed)
blood pressure control important in reducing the progression of atherosclerotic disease. On appropriate oral medications.  

## 2020-06-16 NOTE — Patient Instructions (Signed)
Peripheral Vascular Disease  Peripheral vascular disease (PVD) is a disease of the blood vessels that carry blood from the heart to the rest of the body. PVD is also called peripheral artery disease (PAD) or poor circulation. PVD affects most of the body. But it affects the legs and feet the most. PVD can lead to acute limb ischemia. This happens when there is a sudden stop of blood flow to an arm or leg. This is a medical emergency. What are the causes? The most common cause of PVD is a buildup of a fatty substance (plaque) inside your arteries. This decreases blood flow. Plaque can break off and block blood in a smaller artery. This can lead to acute limb ischemia. Other common causes of PVD include:  Blood clots inside the blood vessels.  Injuries to blood vessels.  Irritation and swelling of blood vessels.  Sudden tightening of the blood vessel (spasms). What increases the risk?  A family history of PVD.  Medical conditions, including: ? High cholesterol. ? Diabetes. ? High blood pressure. ? Heart disease. ? Past problems with blood clots. ? Past injury, such as burns or a broken bone.  Other conditions, such as: ? Buerger's disease. This is caused by swollen or irritated blood vessels in your hands and feet. ? Arthritis. ? Birth defects that affect the arteries in your legs. ? Kidney disease.  Using tobacco or nicotine products.  Not getting enough exercise.  Being very overweight (obese).  Being 50 years old or older. What are the signs or symptoms?  Cramps in your butt, legs, and feet.  Pain and weakness in your legs when you are active that goes away when you rest.  Leg pain when at rest.  Leg numbness, tingling, or weakness.  Coldness in a leg or foot, especially when compared with the other leg or foot.  Skin or hair changes. These can include: ? Hair loss. ? Shiny skin. ? Pale or bluish skin. ? Thick toenails.  Being unable to get or keep an  erection.  Tiredness (fatigue).  Weak pulse or no pulse in the feet.  Wounds and sores on the toes, feet, or legs. These take longer to heal. How is this treated? Underlying causes are treated first. Other conditions, like diabetes, high cholesterol, and blood pressure, are also treated. Treatment may include:  Lifestyle changes, such as: ? Quitting smoking. ? Getting regular exercise. ? Having a diet low in fat and cholesterol. ? Not drinking alcohol.  Taking medicines, such as: ? Blood thinners. ? Medicines to improve blood flow. ? Medicines to improve your blood cholesterol.  Procedures to: ? Open the arteries and restore blood flow. ? Insert a small mesh tube (stent) to keep a blocked vessel open. ? Create a new path for blood to flow to the body (peripheral bypass). ? Remove dead tissue from a wound. ? Remove an affected leg or arm. Follow these instructions at home: Medicines  Take over-the-counter and prescription medicines only as told by your doctor.  If you are taking blood thinners: ? Talk with your doctor before you take any medicines that have aspirin, or NSAIDs, such as ibuprofen. ? Take medicines exactly as told. Take them at the same time each day. ? Avoid doing things that could hurt or bruise you. Take action to prevent falls. ? Wear an alert bracelet or carry a card that shows you are taking blood thinners. Lifestyle  Get regular exercise. Ask your doctor about how to stay active.    Talk with your doctor about keeping a healthy weight. If needed, ask about losing weight.  Eat a diet that is low in fat and cholesterol. If you need help, talk with your doctor.  Do not drink alcohol.  Do not smoke or use any products that contain nicotine or tobacco. If you need help quitting, ask your doctor.      General instructions  Take good care of your feet. To do this: ? Wear shoes that fit well and feel good. ? Check your feet often for any cuts or  sores.  Get a flu shot (influenza vaccine) each year.  Keep all follow-up visits. Where to find more information  Society for Vascular Surgery: vascular.org  American Heart Association: heart.org  National Heart, Lung, and Blood Institute: nhlbi.nih.gov Contact a doctor if:  You have cramps in your legs when you walk.  You have leg pain when you rest.  Your leg or foot feels cold.  Your skin changes.  You cannot get or keep an erection.  You have cuts or sores on your legs or feet that do not heal. Get help right away if:  You have sudden changes in the color and feeling of your arms or legs, such as: ? Your arm or leg turns cold, numb, and blue. ? Your arm or leg becomes red, warm, swollen, painful, or numb.  You have any signs of a stroke. "BE FAST" is an easy way to remember the main warning signs: ? B - Balance. Dizziness, sudden trouble walking, or loss of balance. ? E - Eyes. Trouble seeing or a change in how you see. ? F - Face. Sudden weakness or loss of feeling of the face. The face or eyelid may droop on one side. ? A - Arms. Weakness or loss of feeling in an arm. This happens all of a sudden and most often on one side of the body. ? S - Speech. Sudden trouble speaking, slurred speech, or trouble understanding what people say. ? T - Time. Time to call emergency services. Write down what time symptoms started.  You have other signs of a stroke, such as: ? A sudden, very bad headache with no known cause. ? Feeling like you may vomit (nausea). ? Vomiting. ? A seizure.  You have chest pain or trouble breathing. These symptoms may be an emergency. Get help right away. Call your local emergency services (911 in the [place]  Do not wait to see if the symptoms will go away.  Do not drive yourself to the hospital. Summary  Peripheral vascular disease (PVD) is a disease of the blood vessels.  PVD affects the legs and feet the most.  Symptoms may include leg  pain or leg numbness, tingling, and weakness.  Treatment may include lifestyle changes, medicines, and procedures. This information is not intended to replace advice given to you by your health care provider. Make sure you discuss any questions you have with your health care provider. Document Revised: 11/04/2019 Document Reviewed: 11/04/2019 Elsevier Patient Education  2021 [employer]  

## 2020-06-17 ENCOUNTER — Telehealth (INDEPENDENT_AMBULATORY_CARE_PROVIDER_SITE_OTHER): Payer: Self-pay

## 2020-06-17 ENCOUNTER — Encounter: Payer: Medicare Other | Attending: Internal Medicine | Admitting: Internal Medicine

## 2020-06-17 DIAGNOSIS — Z955 Presence of coronary angioplasty implant and graft: Secondary | ICD-10-CM | POA: Diagnosis not present

## 2020-06-17 DIAGNOSIS — L97429 Non-pressure chronic ulcer of left heel and midfoot with unspecified severity: Secondary | ICD-10-CM | POA: Diagnosis not present

## 2020-06-17 DIAGNOSIS — L97422 Non-pressure chronic ulcer of left heel and midfoot with fat layer exposed: Secondary | ICD-10-CM | POA: Diagnosis not present

## 2020-06-17 DIAGNOSIS — Z87891 Personal history of nicotine dependence: Secondary | ICD-10-CM | POA: Insufficient documentation

## 2020-06-17 DIAGNOSIS — E11621 Type 2 diabetes mellitus with foot ulcer: Secondary | ICD-10-CM | POA: Insufficient documentation

## 2020-06-17 DIAGNOSIS — E1151 Type 2 diabetes mellitus with diabetic peripheral angiopathy without gangrene: Secondary | ICD-10-CM | POA: Diagnosis not present

## 2020-06-17 DIAGNOSIS — E1142 Type 2 diabetes mellitus with diabetic polyneuropathy: Secondary | ICD-10-CM | POA: Insufficient documentation

## 2020-06-17 DIAGNOSIS — I70244 Atherosclerosis of native arteries of left leg with ulceration of heel and midfoot: Secondary | ICD-10-CM | POA: Diagnosis not present

## 2020-06-17 DIAGNOSIS — Z888 Allergy status to other drugs, medicaments and biological substances status: Secondary | ICD-10-CM | POA: Insufficient documentation

## 2020-06-17 NOTE — Progress Notes (Signed)
Ivan Larsen, Ivan Larsen ([id number]) Visit Report for 06/17/2020 Abuse/Suicide Risk Screen Details Patient Name: Ivan Larsen, Ivan Larsen Date of Service: 06/17/2020 8:30 AM Medical Record Number: [id number] Patient Account Number: [id number] Date of Birth/Sex: 01-16-1956 (65 y.o. M) Treating RN: Ivan Larsen Primary Care Ivan Larsen: Ivan Larsen Other Clinician: Referring Ivan Larsen: Ivan Larsen Treating Tashawn Greff/Extender: Ivan Larsen in Treatment: 0 Abuse/Suicide Risk Screen Items Answer ABUSE RISK SCREEN: Has anyone close to you tried to hurt or harm you recentlyo No Do you feel uncomfortable with anyone in your familyo No Has anyone forced you do things that you didnot want to doo No Electronic Signature(s) Signed: 06/17/2020 4:08:12 PM By: Ivan Larsen, Ivan Prader RN Entered By: Ivan Larsen, Larsen on 06/17/2020 08:35:52 Ivan Larsen, Ivan Larsen ([id number]) -------------------------------------------------------------------------------- Activities of Daily Living Details Patient Name: Ivan Larsen Date of Service: 06/17/2020 8:30 AM Medical Record Number: [id number] Patient Account Number: [id number] Date of Birth/Sex: 01/15/1956 (65 y.o. M) Treating RN: Ivan Larsen Primary Care Mikka Kissner: Ivan Larsen Other Clinician: Referring Ivan Larsen: Ivan Larsen Treating Sofhia Ulibarri/Extender: Ivan Larsen in Treatment: 0 Activities of Daily Living Items Answer Activities of Daily Living (Please select one for each item) Drive Automobile Completely Able Take Medications Completely Able Use Telephone Completely Able Care for Appearance Completely Able Use Toilet Completely Able Bath / Shower Completely Able Dress Self Completely Able Feed Self Completely Able Walk Completely Able Get In / Out Bed Completely Able Housework Completely Able Prepare Meals Completely Able Handle Money Completely Able Shop for Self Completely Able Electronic Signature(s) Signed:  06/17/2020 4:08:12 PM By: Ivan Larsen, Ivan Prader RN Entered By: Ivan Larsen, Ivan Larsen on 06/17/2020 08:36:10 Ivan Larsen ([id number]) -------------------------------------------------------------------------------- Education Screening Details Patient Name: Ivan Larsen Date of Service: 06/17/2020 8:30 AM Medical Record Number: [id number] Patient Account Number: [id number] Date of Birth/Sex: [birth year] (64 y.o. M) Treating RN: Ivan Larsen Primary Care Ivan Larsen: Ivan Larsen Other Clinician: Referring Ivan Larsen: Ivan Larsen Treating Ivan Larsen/Extender: Ivan Larsen in Treatment: 0 Primary Learner Assessed: Patient Learning Preferences/Education Level/Primary Language Learning Preference: Explanation, Demonstration Highest Education Level: High School Preferred Language: English Cognitive Barrier Language Barrier: No Translator Needed: No Memory Deficit: No Emotional Barrier: No Cultural/Religious Beliefs Affecting Medical Care: No Physical Barrier Impaired Vision: No Impaired Hearing: No Decreased Hand dexterity: No Knowledge/Comprehension Knowledge Level: High Comprehension Level: High Ability to understand written instructions: High Ability to understand verbal instructions: High Motivation Anxiety Level: Calm Cooperation: Cooperative Education Importance: Acknowledges Need Interest in Health Problems: Asks Questions Perception: Coherent Willingness to Engage in Self-Management High Activities: Readiness to Engage in Self-Management High Activities: Electronic Signature(s) Signed: 06/17/2020 4:08:12 PM By: Ivan Larsen, Ivan Prader RN Entered By: Ivan Larsen, Ivan Larsen on 06/17/2020 08:36:51 Ivan Larsen ([id number]) -------------------------------------------------------------------------------- Fall Risk Assessment Details Patient Name: Ivan Larsen Date of Service: 06/17/2020 8:30 AM Medical Record Number: [id number] Patient  Account Number: [id number] Date of Birth/Sex: [birth year] (64 y.o. M) Treating RN: Ivan Larsen Primary Care Ivan Larsen: Ivan Larsen Other Clinician: Referring Ivan Larsen: Ivan Larsen Treating Ivan Larsen/Extender: Ivan Larsen in Treatment: 0 Fall Risk Assessment Items Have you had 2 or more falls in the last 12 monthso 0 No Have you had any fall that resulted in injury in the last 12 monthso 0 No FALLS RISK SCREEN History of falling - immediate or within 3 months 0 No Secondary diagnosis (Do you have 2 or more medical diagnoseso) 0 No Ambulatory aid None/bed rest/wheelchair/nurse 0 Yes Crutches/cane/walker 0 No Furniture 0 No Intravenous therapy Access/Saline/Heparin Lock 0 No Gait/Transferring  Normal/ bed rest/ wheelchair 0 Yes Weak (short steps with or without shuffle, stooped but able to lift head while walking, may 0 No seek support from furniture) Impaired (short steps with shuffle, may have difficulty arising from chair, head down, impaired 0 No balance) Mental Status Oriented to own ability 0 Yes Electronic Signature(s) Signed: 06/17/2020 4:08:12 PM By: Ivan Larsen, Ivan Prader RN Entered By: Ivan Larsen, Larsen on 06/17/2020 08:37:37 Ivan Larsen ([id number]) -------------------------------------------------------------------------------- Foot Assessment Details Patient Name: Ivan Larsen Date of Service: 06/17/2020 8:30 AM Medical Record Number: [id number] Patient Account Number: [id number] Date of Birth/Sex: [birth year] (64 y.o. M) Treating RN: Ivan Larsen Primary Care Ivan Larsen: Ivan Larsen Other Clinician: Referring Ivan Larsen: Ivan Larsen Treating Ivan Larsen/Extender: Ivan Larsen in Treatment: 0 Foot Assessment Items Site Locations + = Sensation present, - = Sensation absent, C = Callus, U = Ulcer R = Redness, W = Warmth, M = Maceration, PU = Pre-ulcerative lesion F = Fissure, S = Swelling, D =  Dryness Assessment Right: Left: Other Deformity: No No Prior Foot Ulcer: No No Prior Amputation: No No Charcot Joint: No No Ambulatory Status: Ambulatory Without Help Gait: Steady Electronic Signature(s) Signed: 06/17/2020 4:08:12 PM By: Ivan Larsen, Ivan Prader RN Entered By: Ivan Larsen, Ivan Larsen on 06/17/2020 08:45:00 Altizer, Ivan Larsen ([id number]) -------------------------------------------------------------------------------- Nutrition Risk Screening Details Patient Name: Ivan Larsen Date of Service: 06/17/2020 8:30 AM Medical Record Number: [id number] Patient Account Number: [id number] Date of Birth/Sex: [birth year] (65 y.o. M) Treating RN: Ivan Larsen Primary Care Hollyn Stucky: Ivan Larsen Other Clinician: Referring Latera Mclin: Ivan Larsen Treating Verlin Uher/Extender: Ivan [place] in Treatment: 0 Height (in): 65 Weight (lbs): 140 Body Mass Index (BMI): 23.3 Nutrition Risk Screening Items Score Screening NUTRITION RISK SCREEN: I have an illness or condition that made me change the kind and/or amount of food I eat 0 No I eat fewer than two meals per day 0 No I eat few fruits and vegetables, or milk products 0 No I have three or more drinks of beer, liquor or wine almost every day 0 No I have tooth or mouth problems that make it hard for me to eat 0 No I don't always have enough money to buy the food I need 0 No I eat alone most of the time 0 No I take three or more different prescribed or over-the-counter drugs a day 1 Yes Without wanting to, I have lost or gained 10 pounds in the last six months 0 No I am not always physically able to shop, cook and/or feed myself 0 No Nutrition Protocols Good Risk Protocol 0 No interventions needed Moderate Risk Protocol High Risk Proctocol Risk Level: Good Risk Score: 1 Electronic Signature(s) Signed: 06/17/2020 4:08:12 PM By: Ivan Larsen, Ivan Prader RN Entered By: Ivan Larsen, Ivan Larsen on 06/17/2020 08:37:54

## 2020-06-17 NOTE — Telephone Encounter (Signed)
Spoke with the patient and he is scheduled with Dr. Wyn Quaker on 06/25/20 with a 9:30 am arrival time to the [facility]. Covid testing is on 06/23/20 between 8-1 pm at the [facility]. Pre-procedure instructions were discussed and will be mailed.

## 2020-06-18 ENCOUNTER — Telehealth: Payer: Self-pay

## 2020-06-18 ENCOUNTER — Other Ambulatory Visit: Payer: Self-pay | Admitting: Nurse Practitioner

## 2020-06-18 MED ORDER — HYDROCODONE-ACETAMINOPHEN 5-325 MG PO TABS: 1.0000 | ORAL_TABLET | Freq: Four times a day (QID) | ORAL | 0 refills | Status: AC | PRN

## 2020-06-18 NOTE — Telephone Encounter (Signed)
Sent in refills on this, since he recently had debriding of wound and suspect is having more discomfort with this.  Sent in 5 days, #20 pills, alert him to use sparingly with severe pain only.

## 2020-06-18 NOTE — Progress Notes (Addendum)
Larsen, Ivan TOZZI ([id number]) Visit Report for 06/17/2020 Allergy List Details Patient Name: Ivan Larsen, Ivan Larsen Date of Service: 06/17/2020 8:30 AM Medical Record Number: [id number] Patient Account Number: [id number] Date of Birth/Sex: 1955/09/23 (65 y.o. M) Treating Larsen: Ivan Larsen Primary Care Ivan Larsen: Ivan Larsen Other Clinician: Referring Ivan Larsen: Ivan Larsen Treating Ivan Larsen/Extender: Ivan Larsen in Treatment: 0 Allergies Active Allergies iodine Type: Food Shellfish Containing Products Type: Allergen Allergy Notes Electronic Signature(s) Signed: 06/17/2020 4:08:12 PM By: Phillis Haggis, Dondra Prader Larsen Entered By: Phillis Haggis, Dondra Prader on 06/17/2020 08:27:16 Ivan Larsen ([id number]) -------------------------------------------------------------------------------- Arrival Information Details Patient Name: Ivan Larsen Date of Service: 06/17/2020 8:30 AM Medical Record Number: [id number] Patient Account Number: [id number] Date of Birth/Sex: August 11, 1955 (65 y.o. M) Treating Larsen: Ivan Larsen Primary Care Ivan Larsen: Ivan Larsen Other Clinician: Referring Ivan Larsen: Ivan Larsen Treating Ivan Larsen/Extender: Ivan [place] in Treatment: 0 Visit Information Patient Arrived: Ambulatory Arrival Time: 08:20 Accompanied By: self Transfer Assistance: None Patient Identification Verified: Yes Secondary Verification Process Completed: Yes Patient Requires Transmission-Based No Precautions: Patient Has Alerts: Yes Patient Alerts: Patient on Blood Thinner ABI L .49 TBI .22 06/16/20 ABI R 1.03 TBI .50 06/16/20 Plavix Electronic Signature(s) Signed: 06/17/2020 9:43:36 AM By: Ivan Larsen, Ivan Larsen, BSN Entered By: Ivan Larsen, BSN, Larsen, CWS, Kim on 06/17/2020 09:43:36 Ivan Larsen ([id number]) -------------------------------------------------------------------------------- Clinic Level of Care Assessment Details Patient Name: Ivan Larsen Date of Service: 06/17/2020 8:30 AM Medical Record Number: [id number] Patient Account Number: [id number] Date of Birth/Sex: 04-May-1956 (65 y.o. M) Treating Larsen: Ivan Larsen Primary Care Pasqual Farias: Ivan Larsen Other Clinician: Referring Ivan Larsen: Ivan Larsen Treating Ivan Larsen: Ivan Larsen in Treatment: 0 Clinic Level of Care Assessment Items TOOL 1 Quantity Score []  - Use when EandM and Procedure is performed on INITIAL visit 0 ASSESSMENTS - Nursing Assessment / Reassessment X - General Physical Exam (combine w/ comprehensive assessment (listed just below) when performed on new 1 20 pt. evals) X- 1 25 Comprehensive Assessment (HX, ROS, Risk Assessments, Wounds Hx, etc.) ASSESSMENTS - Wound and Skin Assessment / Reassessment []  - Dermatologic / Skin Assessment (not related to wound area) 0 ASSESSMENTS - Ostomy and/or Continence Assessment and Care []  - Incontinence Assessment and Management 0 []  - 0 Ostomy Care Assessment and Management (repouching, etc.) PROCESS - Coordination of Care X - Simple Patient / Family Education for ongoing care 1 15 []  - 0 Complex (extensive) Patient / Family Education for ongoing care X- 1 10 Staff obtains , Records, Test Results / Process Orders []  - 0 Staff telephones HHA, Nursing Homes / Clarify orders / etc []  - 0 Routine Transfer to another Facility (non-emergent condition) []  - 0 Routine Hospital Admission (non-emergent condition) X- 1 15 New Admissions / / Ordering NPWT, Apligraf, etc. []  - 0 Emergency Hospital Admission (emergent condition) PROCESS - Special Needs []  - Pediatric / Minor Patient Management 0 []  - 0 Isolation Patient Management []  - 0 Hearing / Language / Visual special needs []  - 0 Assessment of Community assistance (transportation, D/C planning, etc.) []  - 0 Additional assistance / Altered mentation []  - 0 Support Surface(s) Assessment (bed, cushion, seat,  etc.) INTERVENTIONS - Miscellaneous []  - External ear exam 0 []  - 0 Patient Transfer (multiple staff / / Similar devices) []  - 0 Simple Staple / Suture removal (25 or less) []  - 0 Complex Staple / Suture removal (26 or more) []  - 0 Hypo/Hyperglycemic Management (do not check if billed separately) []  -  0 Ankle / Brachial Index (ABI) - do not check if billed separately Has the patient been seen at the hospital within the last three years: Yes Total Score: 85 Level Of Care: New/Established - Level 3 [name], Ivan Larsen ([id number]) Electronic Signature(s) Signed: 06/17/2020 5:11:32 PM By: Ivan Larsen, Ivan Larsen, BSN Entered By: Ivan Larsen, BSN, Larsen, CWS, Kim on 06/17/2020 09:22:06 Ivan Larsen ([id number]) -------------------------------------------------------------------------------- Encounter Discharge Information Details Patient Name: Ivan Larsen Date of Service: 06/17/2020 8:30 AM Medical Record Number: [id number] Patient Account Number: [id number] Date of Birth/Sex: Dec 09, 1955 (65 y.o. M) Treating Larsen: Ivan Larsen Primary Care Nicolet Griffy: Ivan Larsen Other Clinician: Referring Ivan Larsen: Ivan Larsen Treating Ivan Larsen/Extender: Ivan Larsen in Treatment: 0 Encounter Discharge Information Items Post Procedure Vitals Discharge Condition: Stable Temperature (F): 98.3 Ambulatory Status: Ambulatory Pulse (bpm): 56 Discharge Destination: Home Respiratory Rate (breaths/min): 18 Transportation: Private Auto Blood Pressure (mmHg): 161/75 Accompanied By: self Schedule Follow-up Appointment: Yes Clinical Summary of Care: Electronic Signature(s) Signed: 06/17/2020 5:11:32 PM By: Ivan Larsen, Ivan Larsen, BSN Entered By: Ivan Larsen, BSN, Larsen, CWS, Kim on 06/17/2020 09:23:53 Ivan Larsen ([id number]) -------------------------------------------------------------------------------- Lower Extremity Assessment Details Patient Name: Ivan Larsen Date of Service: 06/17/2020 8:30 AM Medical Record Number: [id number] Patient Account Number: [id number] Date of Birth/Sex: 1956/01/21 (65 y.o. M) Treating Larsen: Rogers Blocker Primary Care Chanay Nugent: Ivan Larsen Other Clinician: Referring Jovin Fester: Ivan Larsen Treating Journi Moffa/Extender: Ivan [place] in Treatment: 0 Edema Assessment Assessed: [Left: No] [Right: No] [Left: Edema] [Right: :] Calf Left: Right: Point of Measurement: 28 cm From Medial Instep 30.5 cm 20.2 cm Ankle Left: Right: Point of Measurement: 10 cm From Medial Instep 28.2 cm 20 cm Vascular Assessment Pulses: Dorsalis Pedis Palpable: [Left:No] [Right:Yes] Electronic Signature(s) Signed: 06/17/2020 4:08:12 PM By: Phillis Haggis, Dondra Prader Larsen Entered By: Phillis Haggis, Dondra Prader on 06/17/2020 08:54:40 Dlouhy, Ivan Larsen ([id number]) -------------------------------------------------------------------------------- Multi Wound Chart Details Patient Name: Ivan Larsen Date of Service: 06/17/2020 8:30 AM Medical Record Number: [id number] Patient Account Number: [id number] Date of Birth/Sex: 14-Jun-1955 (64 y.o. M) Treating Larsen: Ivan Larsen Primary Care Savion Washam: Ivan Larsen Other Clinician: Referring Heydi Swango: Ivan Larsen Treating Zeshan Sena/Extender: Ivan [place] in Treatment: 0 Vital Signs Height(in): 65 Pulse(bpm): 56 Weight(lbs): 140 Blood Pressure(mmHg): 161/75 Body Mass Index(BMI): 23 Temperature(F): 98.3 Respiratory Rate(breaths/min): 18 Photos: [N/A:N/A] Wound Location: Left Calcaneus N/A N/A Wounding Event: Gradually Appeared N/A N/A Primary Etiology: Arterial Insufficiency Ulcer N/A N/A Secondary Etiology: Diabetic Wound/Ulcer of the Lower N/A N/A Extremity Comorbid History: Coronary Artery Disease, N/A N/A Hypertension, Myocardial Infarction, Peripheral Arterial Disease, Type II Diabetes, Neuropathy Date Acquired: 05/16/2020 N/A N/A Weeks of Treatment: 0 N/A  N/A Wound Status: Open N/A N/A Measurements L x W x D (cm) 1.5x1.3x0.1 N/A N/A Area (cm) : 1.532 N/A N/A Volume (cm) : 0.153 N/A N/A Classification: Full Thickness Without Exposed N/A N/A Support Structures Exudate Amount: Medium N/A N/A Exudate Type: Serosanguineous N/A N/A Exudate Color: red, brown N/A N/A Granulation Amount: None Present (0%) N/A N/A Necrotic Amount: Large (67-100%) N/A N/A Necrotic Tissue: Eschar, Adherent Slough N/A N/A Exposed Structures: Fascia: No N/A N/A Fat Layer (Subcutaneous Tissue): No Tendon: No Muscle: No Joint: No Bone: No Epithelialization: None N/A N/A Debridement: Chemical/Enzymatic/Mechanical N/A N/A Pre-procedure Verification/Time 09:11 N/A N/A Out Taken: Instrument: Other(tongue blade) N/A N/A Bleeding: None N/A N/A Debridement Treatment Procedure was tolerated well N/A N/A Response: Post Debridement 1.5x1.3x0.1 N/A N/A Measurements L x W x D (cm) Post Debridement Volume: 0.153 N/A  N/A (cm) Procedures Performed: Debridement N/A N/A Emery, KATHERINE SYME ([id number]) Treatment Notes Electronic Signature(s) Signed: 06/17/2020 6:09:58 PM By: Baltazar Najjar MD Entered By: Baltazar Najjar on 06/17/2020 09:17:58 Mccannon, Ivan Larsen ([id number]) -------------------------------------------------------------------------------- Multi-Disciplinary Care Plan Details Patient Name: Ivan Larsen Date of Service: 06/17/2020 8:30 AM Medical Record Number: [id number] Patient Account Number: [id number] Date of Birth/Sex: December 07, 1955 (64 y.o. M) Treating Larsen: Ivan Larsen Primary Care Dia Jefferys: Ivan Larsen Other Clinician: Referring Christifer Chapdelaine: Ivan Larsen Treating Quinetta Shilling/Extender: Ivan [place] in Treatment: 0 Active Inactive Electronic Signature(s) Signed: 07/17/2020 4:12:01 PM By: Ivan Larsen, Ivan Larsen, BSN Previous Signature: 06/17/2020 5:11:32 PM Version By: Ivan Larsen, Ivan Larsen, BSN Entered By: Ivan Larsen, BSN, Larsen,  CWS, Kim on 07/17/2020 16:12:00 [place], Ivan Larsen ([id number]) -------------------------------------------------------------------------------- Pain Assessment Details Patient Name: Ivan Larsen Date of Service: 06/17/2020 8:30 AM Medical Record Number: [id number] Patient Account Number: [id number] Date of Birth/Sex: [birth year] (64 y.o. M) Treating Larsen: Ivan Larsen Primary Care Smayan Hackbart: Ivan Larsen Other Clinician: Referring Kaydyn Sayas: Ivan Larsen Treating Kasten Leveque/Extender: Ivan [place] in Treatment: 0 Active Problems Location of Pain Severity and Description of Pain Patient Has Paino Yes Site Locations Rate the pain. Current Pain Level: 5 Pain Management and Medication Current Pain Management: Electronic Signature(s) Signed: 06/17/2020 4:47:42 PM By: Dayton Martes RCP, RRT, CHT Signed: 06/17/2020 5:11:32 PM By: Ivan Larsen, Ivan Larsen, BSN Entered By: Dayton Martes on 06/17/2020 08:21:15 Autry, Ivan Larsen ([id number]) -------------------------------------------------------------------------------- Patient/Caregiver Education Details Patient Name: Ivan Larsen Date of Service: 06/17/2020 8:30 AM Medical Record Number: [id number] Patient Account Number: [id number] Date of Birth/Gender: [birth year] (64 y.o. M) Treating Larsen: Ivan Larsen Primary Care Physician: Ivan Larsen Other Clinician: Referring Physician: Aura Larsen Treating Physician/Extender: Ivan [place] in Treatment: 0 Education Assessment Education Provided To: Patient Education Topics Provided Peripheral Neuropathy: Handouts: Neuropathy Methods: Explain/Verbal Responses: State content correctly Tissue Oxygenation: Handouts: Peripheral Arterial Disease and Related Ulcers Methods: Demonstration, Explain/Verbal Responses: State content correctly [facility]: Handouts: [facility] Methods:  Demonstration, Explain/Verbal Responses: State content correctly Wound Debridement: Handouts: Wound Debridement Methods: Demonstration, Explain/Verbal Responses: State content correctly Electronic Signature(s) Signed: 06/17/2020 5:11:32 PM By: Ivan Larsen, Ivan Larsen, BSN Entered By: Ivan Larsen, BSN, Larsen, CWS, Kim on 06/17/2020 09:22:54 [place], Ivan Larsen ([id number]) -------------------------------------------------------------------------------- Wound Assessment Details Patient Name: Ivan Larsen Date of Service: 06/17/2020 8:30 AM Medical Record Number: [id number] Patient Account Number: [id number] Date of Birth/Sex: 1956-04-18 (64 y.o. M) Treating Larsen: Rogers Blocker Primary Care Oliviah Agostini: Ivan Larsen Other Clinician: Referring Broghan Pannone: Ivan Larsen Treating Kiven Vangilder/Extender: Ivan [place] in Treatment: 0 Wound Status Wound Number: 1 Primary Arterial Insufficiency Ulcer Etiology: Wound Location: Left Calcaneus Secondary Diabetic Wound/Ulcer of the Lower Extremity Wounding Event: Gradually Appeared Etiology: Date Acquired: 05/16/2020 Wound Open Weeks Of Treatment: 0 Status: Clustered Wound: No Comorbid Coronary Artery Disease, Hypertension, Myocardial History: Infarction, Peripheral Arterial Disease, Type II Diabetes, Neuropathy Photos Wound Measurements Length: (cm) 1.5 Width: (cm) 1.3 Depth: (cm) 0.1 Area: (cm) 1.532 Volume: (cm) 0.153 % Reduction in Area: % Reduction in Volume: Epithelialization: None Tunneling: No Undermining: No Wound Description Classification: Full Thickness Without Exposed Support Structures Exudate Amount: Medium Exudate Type: Serosanguineous Exudate Color: red, brown Foul Odor After Cleansing: No Slough/Fibrino Yes Wound Bed Granulation Amount: None Present (0%) Exposed Structure Necrotic Amount: Large (67-100%) Fascia Exposed: No Necrotic Quality: Eschar, Adherent Slough Fat Layer (Subcutaneous Tissue)  Exposed: No Tendon  Exposed: No Muscle Exposed: No Joint Exposed: No Bone Exposed: No Electronic Signature(s) Signed: 06/17/2020 4:08:12 PM By: Phillis Haggis, Dondra Prader Larsen Entered By: Phillis Haggis, Kenia on 06/17/2020 08:53:03 Freese, Ivan Larsen ([id number]) -------------------------------------------------------------------------------- Vitals Details Patient Name: Ivan Larsen Date of Service: 06/17/2020 8:30 AM Medical Record Number: [id number] Patient Account Number: [id number] Date of Birth/Sex: [birth year] (64 y.o. M) Treating Larsen: Ivan Larsen Primary Care Annaliah Rivenbark: Ivan Larsen Other Clinician: Referring Marrie Chandra: Ivan Larsen Treating Frederich Montilla/Extender: Ivan [place] in Treatment: 0 Vital Signs Time Taken: 08:22 Temperature (F): 98.3 Height (in): 65 Pulse (bpm): 56 Source: Stated Respiratory Rate (breaths/min): 18 Weight (lbs): 140 Blood Pressure (mmHg): 161/75 Source: Measured Reference Range: 80 - 120 mg / dl Body Mass Index (BMI): 23.3 Electronic Signature(s) Signed: 06/17/2020 4:47:42 PM By: Dayton Martes RCP, RRT, CHT Entered By: Dayton Martes on 06/17/2020 08:23:19

## 2020-06-18 NOTE — Telephone Encounter (Signed)
I think this was accidentally sent to the wrong office  Copied from CRM [id number]. Topic: Quick Communication - Rx Refill/Question >> Jun 18, 2020  8:08 AM Lyn Hollingshead D wrote: PT requesting a refill / foot pain / HYDROcodone-acetaminophen (NORCO) 5-325 MG tablet [ / please advise

## 2020-06-18 NOTE — Progress Notes (Signed)
Youse, Ivan LimesWALTER E. ([id number]) Visit Report for 06/17/2020 Chief Complaint Document Details Patient Name: Ivan Larsen, Ivan E. Date of Service: 06/17/2020 8:30 AM Medical Record Number: [id number] Patient Account Number: [id number] Date of Birth/Sex: 1955/09/18 (65 y.o. M) Treating RN: Huel CoventryWoody, Kim Primary Care Provider: Aura Dialsannady, Jolene Other Clinician: Referring Provider: Aura Dialsannady, Jolene Treating Provider/Extender: Altamese CarolinaOBSON, Sarahbeth Cashin G Weeks in Treatment: 0 Information Obtained from: Patient Chief Complaint 06/17/2020; patient is here for review of a wound on the tip of the left heel Electronic Signature(s) Signed: 06/17/2020 6:09:58 PM By: Baltazar Najjarobson, Emmarie Sannes MD Entered By: Baltazar Najjarobson, Vianka Ertel on 06/17/2020 09:18:29 Ivan Larsen, Ivan LimesWALTER E. ([id number]) -------------------------------------------------------------------------------- Debridement Details Patient Name: Ivan Larsen, Ivan E. Date of Service: 06/17/2020 8:30 AM Medical Record Number: [id number] Patient Account Number: [id number] Date of Birth/Sex: 1955/09/18 (64 y.o. M) Treating RN: Huel CoventryWoody, Kim Primary Care Provider: Aura Dialsannady, Jolene Other Clinician: Referring Provider: Aura Dialsannady, Jolene Treating Provider/Extender: Altamese CarolinaOBSON, Sama Arauz G Weeks in Treatment: 0 Debridement Performed for Wound #1 Left Calcaneus Assessment: Performed By: Physician Maxwell CaulOBSON, Irvin Bastin G, MD Debridement Type: Chemical/Enzymatic/Mechanical Agent Used: Santyl Severity of Tissue Pre Debridement: Fat layer exposed Level of Consciousness (Pre- Awake and Alert procedure): Pre-procedure Verification/Time Out Yes - 09:11 Taken: Instrument: Other : tongue blade Bleeding: None Response to Treatment: Procedure was tolerated well Level of Consciousness (Post- Awake and Alert procedure): Post Debridement Measurements of Total Wound Length: (cm) 1.5 Width: (cm) 1.3 Depth: (cm) 0.1 Volume: (cm) 0.153 Character of Wound/Ulcer Post Debridement: Stable Severity of Tissue Post  Debridement: Fat layer exposed Post Procedure Diagnosis Same as Pre-procedure Electronic Signature(s) Signed: 06/17/2020 5:11:32 PM By: Elliot GurneyWoody, BSN, RN, CWS, Kim RN, BSN Signed: 06/17/2020 6:09:58 PM By: Baltazar Najjarobson, Koleson Reifsteck MD Entered By: Baltazar Najjarobson, Inetta Dicke on 06/17/2020 09:18:09 Ivan Larsen, Ivan LimesWALTER E. ([id number]) -------------------------------------------------------------------------------- HPI Details Patient Name: Ivan Larsen, Ivan E. Date of Service: 06/17/2020 8:30 AM Medical Record Number: [id number] Patient Account Number: [id number] Date of Birth/Sex: 1955/09/18 (65 y.o. M) Treating RN: Huel CoventryWoody, Kim Primary Care Provider: Aura Dialsannady, Jolene Other Clinician: Referring Provider: Aura Dialsannady, Jolene Treating Provider/Extender: Altamese CarolinaOBSON, Mervil Wacker G Weeks in Treatment: 0 History of Present Illness HPI Description: ADMISSION 06/17/2020 This is a 65 year old man who is retired. He is a type II diabetic with peripheral neuropathy. Ex-smoker quitting in 2005. He has had a wound on the left heel since September 2021. At 1 point he saw Dr. Logan BoresEvans at [facility] but was referred back to primary care. I think they predominantly use Santyl but most recently the patient is using mupirocin. The patient has very recently come to the attention of Dr. Wyn Quakerew of vein and vascular. ABIs on the right showed a ABI of 1.03 with a TBI of 0.50 monophasic waveforms on the left the ABI was markedly reduced at 0.49 with a TBI of 0.22 and a toe pressure of only 35. Arterial Dopplers showed bilateral SFA occlusion. The patient describes claudication at rest in the left leg and with minimal activity on the right. As far as I understand he is being booked urgently for angiography for critical limb ischemia but we do not yet have a date. The patient has a dime sized area over the tip of the left heel. This is covered in a necrotic eschar. The wound surface is not visible. Past medical history includes coronary artery disease status post 2  stents, type 2 diabetes with peripheral neuropathy, oPATIENT IS ALLERGIC TO IODINE ABIs were not repeated in our clinic they were just done yesterday in Dr. Driscilla Grammesew's office as quoted above. I think it is quite likely that  the ABI on the right of 1.03 is probably falsely elevated by calcifications. He has had a recent x-ray as well that did not show osteomyelitis however it did show vessel calcification. Electronic Signature(s) Signed: 06/17/2020 6:09:58 PM By: Baltazar Najjar MD Entered By: Baltazar Najjar on 06/17/2020 09:23:27 Memoli, Ivan Larsen ([id number]) -------------------------------------------------------------------------------- Physical Exam Details Patient Name: Ivan Larsen Date of Service: 06/17/2020 8:30 AM Medical Record Number: [id number] Patient Account Number: [id number] Date of Birth/Sex: [birth year] (64 y.o. M) Treating RN: Huel Coventry Primary Care Provider: Aura Dials Other Clinician: Referring Provider: Aura Dials Treating Provider/Extender: Altamese [place] in Treatment: 0 Constitutional Patient is hypertensive.. Pulse regular and within target range for patient.Marland Kitchen Respirations regular, non-labored and within target range.. Temperature is normal and within the target range for the patient.Marland Kitchen appears in no distress. Respiratory Respiratory effort is easy and symmetric bilaterally. Rate is normal at rest and on room air.. Cardiovascular Heart rhythm and rate regular, without murmur or gallop.Marland Kitchen He has faint femoral artery pulses bilaterally I could not feel either popliteal. Pedal pulses absent bilaterally.. Notes Wound exam; the patient has a small dime sized necrotic wound on the tip of the left heel this is not gangrenous but it is covered with eschar. There is no evidence of surrounding infection. No mechanical debridement until revascularization Electronic Signature(s) Signed: 06/17/2020 6:09:58 PM By: Baltazar Najjar MD Entered By: Baltazar Najjar  on 06/17/2020 09:24:45 Ivan Larsen, Ivan Larsen ([id number]) -------------------------------------------------------------------------------- Physician Orders Details Patient Name: Ivan Larsen Date of Service: 06/17/2020 8:30 AM Medical Record Number: [id number] Patient Account Number: [id number] Date of Birth/Sex: 19-Nov-1955 (64 y.o. M) Treating RN: Huel Coventry Primary Care Provider: Aura Dials Other Clinician: Referring Provider: Aura Dials Treating Provider/Extender: Altamese [place] in Treatment: 0 Verbal / Phone Orders: No Diagnosis Coding Follow-up Appointments Wound #1 Left Calcaneus o Return Appointment in 2 weeks. Bathing/ Shower/ Hygiene Wound #1 Left Calcaneus o May shower; gently cleanse wound with antibacterial soap, rinse and pat dry prior to dressing wounds Off-Loading Wound #1 Left Calcaneus o Heel suspension boot Additional Orders / Instructions Wound #1 Left Calcaneus o Follow Nutritious Diet and Increase Protein Intake Wound Treatment Wound #1 - Calcaneus Wound Laterality: Left Topical: Santyl Collagenase Ointment, 30 (gm), tube 1 x Per Day/30 Days Discharge Instructions: apply nickel thick to wound bed only Primary Dressing: Gauze (DME) (Generic) 1 x Per Day/30 Days Discharge Instructions: As directed: dry, moistened with saline or moistened with Dakins Solution Secondary Dressing: Conforming Guaze Roll-Medium (DME) (Generic) 1 x Per Day/30 Days Discharge Instructions: Apply Conforming Stretch Guaze Bandage as directed Secured With: 71M Medipore H Soft Cloth Surgical Tape, 2x2 (in/yd) 1 x Per Day/30 Days Services and Therapies o Angiogram, peripheral - [facility] Electronic Signature(s) Signed: 06/17/2020 5:11:32 PM By: Elliot Gurney, BSN, RN, CWS, Kim RN, BSN Signed: 06/17/2020 6:09:58 PM By: Baltazar Najjar MD Entered By: Elliot Gurney, BSN, RN, CWS, Kim on 06/17/2020 10:27:25 Ivan Larsen, Ivan Larsen  ([id number]) -------------------------------------------------------------------------------- Problem List Details Patient Name: Ivan Larsen Date of Service: 06/17/2020 8:30 AM Medical Record Number: [id number] Patient Account Number: [id number] Date of Birth/Sex: [birth year] (65 y.o. M) Treating RN: Huel Coventry Primary Care Provider: Aura Dials Other Clinician: Referring Provider: Aura Dials Treating Provider/Extender: Altamese [place] in Treatment: 0 Active Problems ICD-10 Encounter Code Description Active Date MDM Diagnosis E11.621 Type 2 diabetes mellitus with foot ulcer 06/17/2020 No Yes E11.51 Type 2 diabetes mellitus with diabetic peripheral angiopathy without 06/17/2020 No Yes gangrene L97.428 Non-pressure chronic ulcer of left  heel and midfoot with other specified 06/17/2020 No Yes severity Inactive Problems Resolved Problems Electronic Signature(s) Signed: 06/17/2020 6:09:58 PM By: Baltazar Najjar MD Entered By: Baltazar Najjar on 06/17/2020 09:17:35 Ivan Larsen, Ivan Larsen ([id number]) -------------------------------------------------------------------------------- Progress Note Details Patient Name: Ivan Larsen Date of Service: 06/17/2020 8:30 AM Medical Record Number: [id number] Patient Account Number: [id number] Date of Birth/Sex: 05/30/1955 (64 y.o. M) Treating RN: Huel Coventry Primary Care Provider: Aura Dials Other Clinician: Referring Provider: Aura Dials Treating Provider/Extender: Altamese [place] in Treatment: 0 Subjective Chief Complaint Information obtained from Patient 06/17/2020; patient is here for review of a wound on the tip of the left heel History of Present Illness (HPI) ADMISSION 06/17/2020 This is a 65 year old man who is retired. He is a type II diabetic with peripheral neuropathy. Ex-smoker quitting in 2005. He has had a wound on the left heel since September 2021. At 1 point he saw Dr. [facility] Bores at [facility]  but was referred back to primary care. I think they predominantly use Santyl but most recently the patient is using mupirocin. The patient has very recently come to the attention of Dr. Wyn Quaker of vein and vascular. ABIs on the right showed a ABI of 1.03 with a TBI of 0.50 monophasic waveforms on the left the ABI was markedly reduced at 0.49 with a TBI of 0.22 and a toe pressure of only 35. Arterial Dopplers showed bilateral SFA occlusion. The patient describes claudication at rest in the left leg and with minimal activity on the right. As far as I understand he is being booked urgently for angiography for critical limb ischemia but we do not yet have a date. The patient has a dime sized area over the tip of the left heel. This is covered in a necrotic eschar. The wound surface is not visible. Past medical history includes coronary artery disease status post 2 stents, type 2 diabetes with peripheral neuropathy, PATIENT IS ALLERGIC TO IODINE ABIs were not repeated in our clinic they were just done yesterday in Dr. Driscilla Grammes office as quoted above. I think it is quite likely that the ABI on the right of 1.03 is probably falsely elevated by calcifications. He has had a recent x-ray as well that did not show osteomyelitis however it did show vessel calcification. Patient History Information obtained from Patient. Allergies iodine, Shellfish Containing Products Social History Former smoker - quit in 2005, Marital Status - Married, Alcohol Use - Rarely, Drug Use - No History, Caffeine Use - Rarely. Medical History Ear/Nose/Mouth/Throat Denies history of Chronic sinus problems/congestion, Middle ear problems Hematologic/Lymphatic Denies history of Anemia, Hemophilia, Human Immunodeficiency Virus, Lymphedema, Sickle Cell Disease Respiratory Denies history of Aspiration, Asthma, Chronic Obstructive Pulmonary Disease (COPD), Pneumothorax, Sleep Apnea, Tuberculosis Cardiovascular Patient has history of  Coronary Artery Disease, Hypertension, Myocardial Infarction, Peripheral Arterial Disease Denies history of Angina, Arrhythmia, Congestive Heart Failure, Deep Vein Thrombosis, Hypotension, Peripheral Venous Disease, Phlebitis, Vasculitis Gastrointestinal Denies history of Cirrhosis , Colitis, Crohn s, Hepatitis A, Hepatitis B, Hepatitis C Endocrine Patient has history of Type II Diabetes Denies history of Type I Diabetes Genitourinary Denies history of End Stage Renal Disease Immunological Denies history of Lupus Erythematosus, Raynaud s, Scleroderma Integumentary (Skin) Denies history of History of Burn, History of pressure wounds Musculoskeletal Denies history of Gout, Rheumatoid Arthritis, Osteoarthritis, Osteomyelitis Neurologic Patient has history of Neuropathy - feet Denies history of Dementia, Quadriplegia, Paraplegia, Seizure Disorder Oncologic Denies history of Received Chemotherapy, Received Radiation Psychiatric Nierman, SAYID MOLL ([id number]) Denies history  of Anorexia/bulimia, Confinement Anxiety Patient is treated with Oral Agents. Blood sugar is not tested. Hospitalization/Surgery History - Coronary stent placement. Review of Systems (ROS) Constitutional Symptoms (General Health) Denies complaints or symptoms of Fatigue, Fever, Chills, Marked Weight Change. Eyes Denies complaints or symptoms of Dry Eyes, Vision Changes, Glasses / Contacts. Ear/Nose/Mouth/Throat Denies complaints or symptoms of Difficult clearing ears, Sinusitis. Hematologic/Lymphatic Denies complaints or symptoms of Bleeding / Clotting Disorders, Human Immunodeficiency Virus. Respiratory Denies complaints or symptoms of Chronic or frequent coughs, Shortness of Breath. Cardiovascular Denies complaints or symptoms of Chest pain, LE edema. Gastrointestinal Denies complaints or symptoms of Frequent diarrhea, Nausea, Vomiting. Endocrine Denies complaints or symptoms of Hepatitis, Thyroid disease,  Polydypsia (Excessive Thirst). Genitourinary Denies complaints or symptoms of Kidney failure/ Dialysis, Incontinence/dribbling. Immunological Denies complaints or symptoms of Hives, Itching. Integumentary (Skin) Complains or has symptoms of Wounds. Denies complaints or symptoms of Bleeding or bruising tendency, Breakdown, Swelling. Musculoskeletal Denies complaints or symptoms of Muscle Pain, Muscle Weakness. Neurologic Denies complaints or symptoms of Numbness/parasthesias, Focal/Weakness. Psychiatric Denies complaints or symptoms of Anxiety, Claustrophobia. Objective Constitutional Patient is hypertensive.. Pulse regular and within target range for patient.Marland Kitchen Respirations regular, non-labored and within target range.. Temperature is normal and within the target range for the patient.Marland Kitchen appears in no distress. Vitals Time Taken: 8:22 AM, Height: 65 in, Source: Stated, Weight: 140 lbs, Source: Measured, BMI: 23.3, Temperature: 98.3 F, Pulse: 56 bpm, Respiratory Rate: 18 breaths/min, Blood Pressure: 161/75 mmHg. Respiratory Respiratory effort is easy and symmetric bilaterally. Rate is normal at rest and on room air.. Cardiovascular Heart rhythm and rate regular, without murmur or gallop.Marland Kitchen He has faint femoral artery pulses bilaterally I could not feel either popliteal. Pedal pulses absent bilaterally.. General Notes: Wound exam; the patient has a small dime sized necrotic wound on the tip of the left heel this is not gangrenous but it is covered with eschar. There is no evidence of surrounding infection. No mechanical debridement until revascularization Integumentary (Hair, Skin) Wound #1 status is Open. Original cause of wound was Gradually Appeared. The wound is located on the Left Calcaneus. The wound measures 1.5cm length x 1.3cm width x 0.1cm depth; 1.532cm^2 area and 0.153cm^3 volume. There is no tunneling or undermining noted. There is a medium amount of serosanguineous drainage  noted. There is no granulation within the wound bed. There is a large (67-100%) amount of necrotic tissue within the wound bed including Eschar and Adherent Slough. 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 There was a Chemical/Enzymatic/Mechanical debridement performed by Maxwell Caul, MD. With the following instrument (s): tongue blade. Agent used was [employer]. A time out was conducted at 09:11, prior to the start of the procedure. There was no bleeding. The procedure was tolerated well. Post Debridement Measurements: 1.5cm length x 1.3cm width x 0.1cm depth; 0.153cm^3 volume. Character of Wound/Ulcer Post Debridement is stable. Severity of Tissue Post Debridement is: Fat layer exposed. Post procedure Diagnosis Wound #1: Same as Pre-Procedure Plan Follow-up Appointments: Wound #1 Left Calcaneus: Return Appointment in 1 week. Bathing/ Shower/ Hygiene: Wound #1 Left Calcaneus: May shower; gently cleanse wound with antibacterial soap, rinse and pat dry prior to dressing wounds  Off-Loading: Wound #1 Left Calcaneus: Heel suspension boot Additional Orders / Instructions: Wound #1 Left Calcaneus: Follow Nutritious Diet and Increase Protein Intake Services and Therapies ordered were: Angiogram, peripheral - [facility] WOUND #1: - Calcaneus Wound Laterality: Left Topical: Santyl Collagenase Ointment, 30 (gm), tube 1 x Per Day/30 Days Discharge Instructions: apply nickel thick to wound bed only Primary Dressing: Gauze (DME) (Generic) 1 x Per Day/30 Days Discharge Instructions: As directed:  dry, moistened with saline or moistened with Dakins Solution Secondary Dressing: ABD Pad 5x9 (in/in) (DME) (Generic) 1 x Per Day/30 Days Discharge Instructions: Cover with ABD pad Secondary Dressing: Conforming Guaze Roll-Medium (DME) (Generic) 1 x Per Day/30 Days Discharge Instructions: Apply Conforming Stretch Guaze Bandage as directed Secured With: 42M Medipore H Soft Cloth Surgical Tape, 2x2 (in/yd) 1 x Per Day/30 Days 1. I think the patient should go back to [place]. This will help by softening this area for ultimately required mechanical debridement. This is not indicated until hopefully he has been adequately revascularized 2. This qualifies as critical limb ischemia. If nothing can be done about the vascular supply then the patient will be at significant risk of amputation. I believe he is already been told this. He has claudication at rest in the left leg 3. X-ray done on 126/22 showed no evidence of osteomyelitis but vessel calcification no cultures or antibiotics are currently indicated 4. Are giving him a heel offloading boot to wear. I cautioned him to both keeping this off the mattress at night he says it is too painful to put on the bed in any case I spent 35 minutes in review of this patient's past medical history, face-to-face evaluation and preparation of this record Electronic Signature(s) Signed: 06/17/2020 6:09:58 PM By: Baltazar Najjar MD Entered By: Baltazar Najjar on 06/17/2020 81:19:14 Ivan Larsen, Ivan Larsen ([id number]) -------------------------------------------------------------------------------- ROS/PFSH Details Patient Name: Ivan Larsen Date of Service: 06/17/2020 8:30 AM Medical Record Number: [id number] Patient Account Number: [id number] Date of Birth/Sex: 1956-05-08 (64 y.o. M) Treating RN: Rogers Blocker Primary Care Provider: Aura Dials Other Clinician: Referring Provider: Aura Dials Treating Provider/Extender: Altamese [place] in  Treatment: 0 Information Obtained From Patient Constitutional Symptoms (General Health) Complaints and Symptoms: Negative for: Fatigue; Fever; Chills; Marked Weight Change Eyes Complaints and Symptoms: Negative for: Dry Eyes; Vision Changes; Glasses / Contacts Ear/Nose/Mouth/Throat Complaints and Symptoms: Negative for: Difficult clearing ears; Sinusitis Medical History: Negative for: Chronic sinus problems/congestion; Middle ear problems Hematologic/Lymphatic Complaints and Symptoms: Negative for: Bleeding / Clotting Disorders; Human Immunodeficiency Virus Medical History: Negative for: Anemia; Hemophilia; Human Immunodeficiency Virus; Lymphedema; Sickle Cell Disease Respiratory Complaints and Symptoms: Negative for: Chronic or frequent coughs; Shortness of Breath Medical History: Negative for: Aspiration; Asthma; Chronic Obstructive Pulmonary Disease (COPD); Pneumothorax; Sleep Apnea; Tuberculosis Cardiovascular Complaints and Symptoms: Negative for: Chest pain; LE edema Medical History: Positive for: Coronary Artery Disease; Hypertension; Myocardial Infarction; Peripheral Arterial Disease Negative for: Angina; Arrhythmia; Congestive Heart Failure; Deep Vein Thrombosis; Hypotension; Peripheral Venous Disease; Phlebitis; Vasculitis Gastrointestinal Complaints and Symptoms: Negative for: Frequent diarrhea; Nausea; Vomiting Medical History: Negative for: Cirrhosis ; Colitis; Crohnos; Hepatitis A; Hepatitis B; Hepatitis C Endocrine Complaints and Symptoms: Negative for: Hepatitis; Thyroid disease; Polydypsia (Excessive Thirst) Medical History: Umphlett, RAYHAN GROLEAU ([id number]) Positive for: Type II Diabetes Negative for: Type I Diabetes Time with diabetes: 17 years Treated with: Oral agents Blood sugar tested every day: No Genitourinary Complaints and Symptoms: Negative for: Kidney failure/ Dialysis; Incontinence/dribbling Medical History: Negative for: End Stage Renal  Disease Immunological Complaints and  Symptoms: Negative for: Hives; Itching Medical History: Negative for: Lupus Erythematosus; Raynaudos; Scleroderma Integumentary (Skin) Complaints and Symptoms: Positive for: Wounds Negative for: Bleeding or bruising tendency; Breakdown; Swelling Medical History: Negative for: History of Burn; History of pressure wounds Musculoskeletal Complaints and Symptoms: Negative for: Muscle Pain; Muscle Weakness Medical History: Negative for: Gout; Rheumatoid Arthritis; Osteoarthritis; Osteomyelitis Neurologic Complaints and Symptoms: Negative for: Numbness/parasthesias; Focal/Weakness Medical History: Positive for: Neuropathy - feet Negative for: Dementia; Quadriplegia; Paraplegia; Seizure Disorder Psychiatric Complaints and Symptoms: Negative for: Anxiety; Claustrophobia Medical History: Negative for: Anorexia/bulimia; Confinement Anxiety Oncologic Medical History: Negative for: Received Chemotherapy; Received Radiation Immunizations Pneumococcal Vaccine: Received Pneumococcal Vaccination: No Implantable Devices None Lingerfelt, SIVAN QUAST. ([id number]) Hospitalization / Surgery History Type of Hospitalization/Surgery Coronary stent placement Family and Social History Former smoker - quit in 2005; Marital Status - Married; Alcohol Use: Rarely; Drug Use: No History; Caffeine Use: Rarely Electronic Signature(s) Signed: 06/17/2020 4:08:12 PM By: Phillis Haggis, Dondra Prader RN Signed: 06/17/2020 6:09:58 PM By: Baltazar Najjar MD Entered By: Phillis Haggis, Dondra Prader on 06/17/2020 08:35:43 Hedgecock, Ivan Larsen ([id number]) -------------------------------------------------------------------------------- SuperBill Details Patient Name: Ivan Larsen Date of Service: 06/17/2020 Medical Record Number: [id number] Patient Account Number: [id number] Date of Birth/Sex: 1956-01-08 (65 y.o. M) Treating RN: Huel Coventry Primary Care Provider: Aura Dials Other  Clinician: Referring Provider: Aura Dials Treating Provider/Extender: Altamese [place] in Treatment: 0 Diagnosis Coding ICD-10 Codes Code Description E11.621 Type 2 diabetes mellitus with foot ulcer E11.51 Type 2 diabetes mellitus with diabetic peripheral angiopathy without gangrene L97.428 Non-pressure chronic ulcer of left heel and midfoot with other specified severity Facility Procedures CPT4 Code: [id number] Description: 99213 - WOUND CARE VISIT-LEV 3 EST PT Modifier: Quantity: 1 CPT4 Code: [id number] Description: 93903 - DEBRIDE W/O ANES NON SELECT Modifier: Quantity: 1 Physician Procedures CPT4 Code: [id number] Description: WC PHYS LEVEL 3 o NEW PT Modifier: Quantity: 1 CPT4 Code: Description: ICD-10 Diagnosis Description E11.621 Type 2 diabetes mellitus with foot ulcer E11.51 Type 2 diabetes mellitus with diabetic peripheral angiopathy without L97.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

## 2020-06-18 NOTE — Telephone Encounter (Signed)
Routing to provider to advise.  

## 2020-06-18 NOTE — Telephone Encounter (Signed)
PT called in to follow up regarding his prescription refill for pain, states he is currently in pain and really needs it. Please advise.

## 2020-06-18 NOTE — Telephone Encounter (Signed)
Copied from CRM [id number]. Topic: Quick Communication - Rx Refill/Question >> Jun 18, 2020  9:22 AM Lyn Hollingshead D wrote: PT requesting a refill / foot pain / HYDROcodone-acetaminophen (NORCO) 5-325 MG tablet [ / please advise

## 2020-06-19 DIAGNOSIS — S91309A Unspecified open wound, unspecified foot, initial encounter: Secondary | ICD-10-CM | POA: Diagnosis not present

## 2020-06-19 NOTE — Telephone Encounter (Signed)
Patient notified and verbalized understanding. 

## 2020-06-23 ENCOUNTER — Other Ambulatory Visit: Payer: Self-pay

## 2020-06-23 ENCOUNTER — Telehealth: Payer: Self-pay

## 2020-06-23 ENCOUNTER — Other Ambulatory Visit
Admission: RE | Admit: 2020-06-23 | Discharge: 2020-06-23 | Disposition: A | Discharge status: Home / Self Care | Payer: Medicare Other | Source: Ambulatory Visit | Attending: Vascular Surgery | Admitting: Vascular Surgery

## 2020-06-23 NOTE — Telephone Encounter (Signed)
FYI

## 2020-06-23 NOTE — Telephone Encounter (Signed)
Ivan Larsen from wound care came to leave wound care and a progress note for pt as left a couple of information as she fears amputation would be the next step  Mrs.Ivan Larsen came would like provider to review packet she left with the note as she belives it may be beneficial. Everything left in bin to be reviewed

## 2020-06-23 NOTE — Telephone Encounter (Signed)
Good evening, Tara.  I did review your note in chart recently and what you presented at office.  Thank you for seeing this patient, I took over this wound later on, did not initially see him for this when first presented.  Was concerned for possible circulation issues and loss of limb -- so placed vascular referral, I read over their recent notes as well.  I had prepared him for some of this discussion and concern.  I kept wound clinic pamphlet and your information, in case needed for future issues.  I appreciate that.  Please keep me up to date on patient, I am very involved in my patient's care. I will continue to review notes as well.

## 2020-06-23 NOTE — Telephone Encounter (Signed)
Ivan Larsen

## 2020-06-24 ENCOUNTER — Other Ambulatory Visit (INDEPENDENT_AMBULATORY_CARE_PROVIDER_SITE_OTHER): Payer: Self-pay | Admitting: Nurse Practitioner

## 2020-06-25 ENCOUNTER — Other Ambulatory Visit: Payer: Self-pay

## 2020-06-25 ENCOUNTER — Encounter: Payer: Self-pay | Admitting: Vascular Surgery

## 2020-06-25 ENCOUNTER — Other Ambulatory Visit
Admission: RE | Admit: 2020-06-25 | Discharge: 2020-06-25 | Disposition: A | Discharge status: Home / Self Care | Payer: Medicare Other | Source: Ambulatory Visit | Attending: Vascular Surgery | Admitting: Vascular Surgery

## 2020-06-25 ENCOUNTER — Ambulatory Visit: Payer: Medicare Other

## 2020-06-25 ENCOUNTER — Encounter
Admission: RE | Disposition: A | Discharge status: Home / Self Care | Payer: Self-pay | Source: Home / Self Care | Attending: Vascular Surgery

## 2020-06-25 ENCOUNTER — Ambulatory Visit
Admission: RE | Admit: 2020-06-25 | Discharge: 2020-06-25 | Disposition: A | Discharge status: Home / Self Care | Payer: Medicare Other | Attending: Vascular Surgery | Admitting: Vascular Surgery

## 2020-06-25 DIAGNOSIS — E11621 Type 2 diabetes mellitus with foot ulcer: Secondary | ICD-10-CM | POA: Diagnosis not present

## 2020-06-25 DIAGNOSIS — Z87891 Personal history of nicotine dependence: Secondary | ICD-10-CM | POA: Diagnosis not present

## 2020-06-25 DIAGNOSIS — L97909 Non-pressure chronic ulcer of unspecified part of unspecified lower leg with unspecified severity: Secondary | ICD-10-CM

## 2020-06-25 DIAGNOSIS — I10 Essential (primary) hypertension: Secondary | ICD-10-CM | POA: Insufficient documentation

## 2020-06-25 DIAGNOSIS — Z20822 Contact with and (suspected) exposure to covid-19: Secondary | ICD-10-CM | POA: Insufficient documentation

## 2020-06-25 DIAGNOSIS — Z7984 Long term (current) use of oral hypoglycemic drugs: Secondary | ICD-10-CM | POA: Diagnosis not present

## 2020-06-25 DIAGNOSIS — Z91013 Allergy to seafood: Secondary | ICD-10-CM | POA: Diagnosis not present

## 2020-06-25 DIAGNOSIS — I701 Atherosclerosis of renal artery: Secondary | ICD-10-CM | POA: Diagnosis not present

## 2020-06-25 DIAGNOSIS — E1151 Type 2 diabetes mellitus with diabetic peripheral angiopathy without gangrene: Secondary | ICD-10-CM | POA: Diagnosis not present

## 2020-06-25 DIAGNOSIS — Z7902 Long term (current) use of antithrombotics/antiplatelets: Secondary | ICD-10-CM | POA: Insufficient documentation

## 2020-06-25 DIAGNOSIS — I2510 Atherosclerotic heart disease of native coronary artery without angina pectoris: Secondary | ICD-10-CM | POA: Insufficient documentation

## 2020-06-25 DIAGNOSIS — Z91041 Radiographic dye allergy status: Secondary | ICD-10-CM | POA: Insufficient documentation

## 2020-06-25 DIAGNOSIS — I70223 Atherosclerosis of native arteries of extremities with rest pain, bilateral legs: Secondary | ICD-10-CM | POA: Insufficient documentation

## 2020-06-25 DIAGNOSIS — K551 Chronic vascular disorders of intestine: Secondary | ICD-10-CM | POA: Diagnosis not present

## 2020-06-25 DIAGNOSIS — I7102 Dissection of abdominal aorta: Secondary | ICD-10-CM | POA: Diagnosis not present

## 2020-06-25 DIAGNOSIS — I70244 Atherosclerosis of native arteries of left leg with ulceration of heel and midfoot: Secondary | ICD-10-CM | POA: Diagnosis not present

## 2020-06-25 DIAGNOSIS — Z79899 Other long term (current) drug therapy: Secondary | ICD-10-CM | POA: Insufficient documentation

## 2020-06-25 DIAGNOSIS — L97429 Non-pressure chronic ulcer of left heel and midfoot with unspecified severity: Secondary | ICD-10-CM | POA: Diagnosis not present

## 2020-06-25 DIAGNOSIS — I70249 Atherosclerosis of native arteries of left leg with ulceration of unspecified site: Secondary | ICD-10-CM | POA: Diagnosis not present

## 2020-06-25 DIAGNOSIS — I70221 Atherosclerosis of native arteries of extremities with rest pain, right leg: Secondary | ICD-10-CM | POA: Diagnosis not present

## 2020-06-25 DIAGNOSIS — I743 Embolism and thrombosis of arteries of the lower extremities: Secondary | ICD-10-CM | POA: Diagnosis not present

## 2020-06-25 HISTORY — PX: LOWER EXTREMITY ANGIOGRAPHY: CATH118251

## 2020-06-25 LAB — CREATININE, SERUM
Creatinine, Ser: 0.9 mg/dL (ref 0.61–1.24)
GFR, Estimated: >60 mL/min (ref >60)

## 2020-06-25 LAB — GLUCOSE, CAPILLARY: Glucose-Capillary: 146 mg/dL — ABNORMAL HIGH (ref 70–99)

## 2020-06-25 LAB — BUN: BUN: 24 mg/dL — ABNORMAL HIGH (ref 8–23)

## 2020-06-25 LAB — SARS CORONAVIRUS 2 BY RT PCR (HOSPITAL ORDER, PERFORMED IN ~~LOC~~ HOSPITAL LAB): SARS Coronavirus 2: NEGATIVE

## 2020-06-25 IMAGING — CT CT CTA ABD/PEL W/CM AND/OR W/O CM
2 of 7 series · 13 of 46 positions shown, 15 images · IV contrast (omnipaque)
Comparison: None.

CLINICAL DATA: Aortoiliac occlusion

EXAM:
CTA ABDOMEN AND PELVIS WITHOUT AND WITH CONTRAST
TECHNIQUE: Multidetector CT imaging of the abdomen and pelvis was performed
using the standard protocol during bolus administration of
intravenous contrast. Multiplanar reconstructed images and MIPs were
obtained and reviewed to evaluate the vascular anatomy.
CONTRAST:  75mL OMNIPAQUE IOHEXOL 350 MG/ML SOLN

[Series 4: axial arterial · axial · arterial · 0.73mm/px · z∈[-464,+4]mm · 10 of 274 slices shown, 12 images]
[im 27/274  soft-tissue]
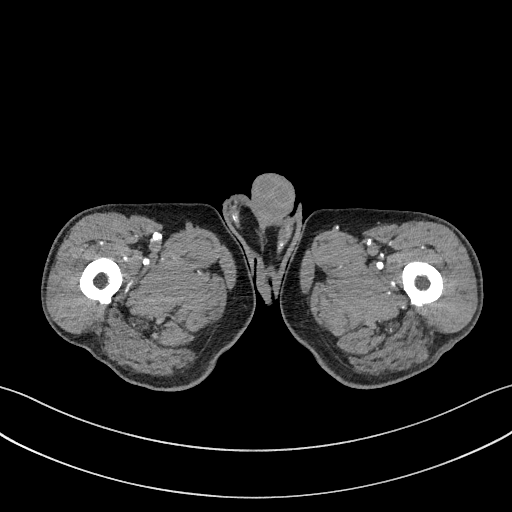
[im 27/274  bone]
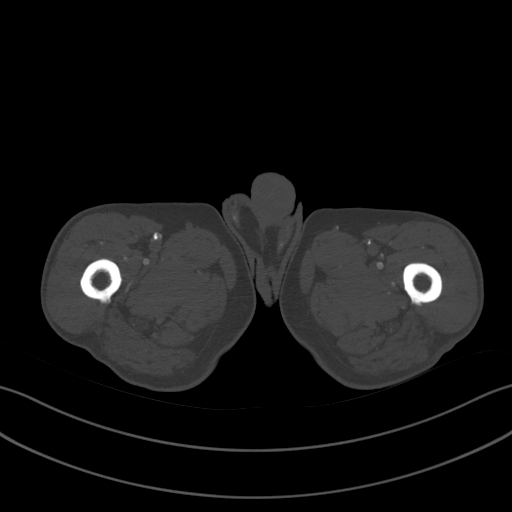
[im 53/274  soft-tissue]
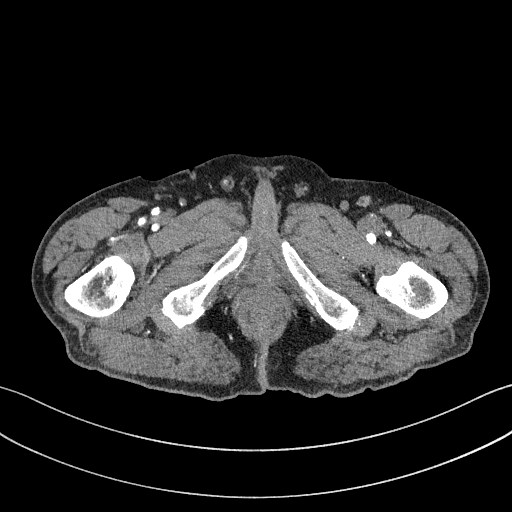
[im 79/274  soft-tissue]
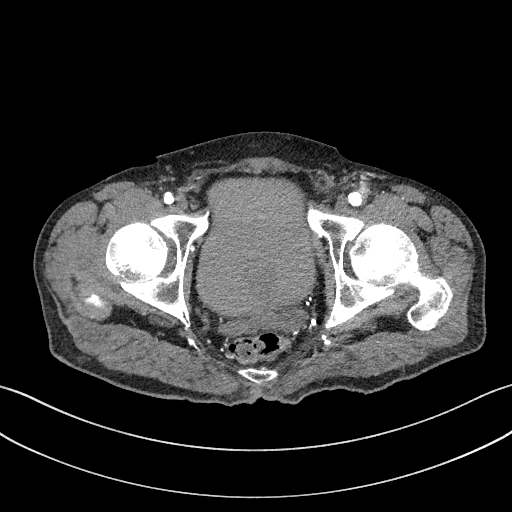
[im 105/274  soft-tissue]
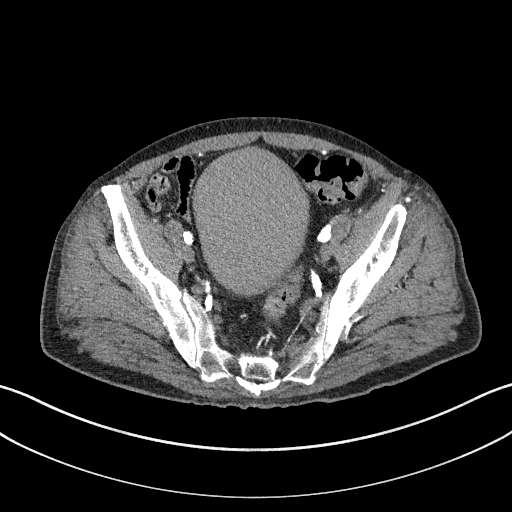
[im 131/274  soft-tissue]
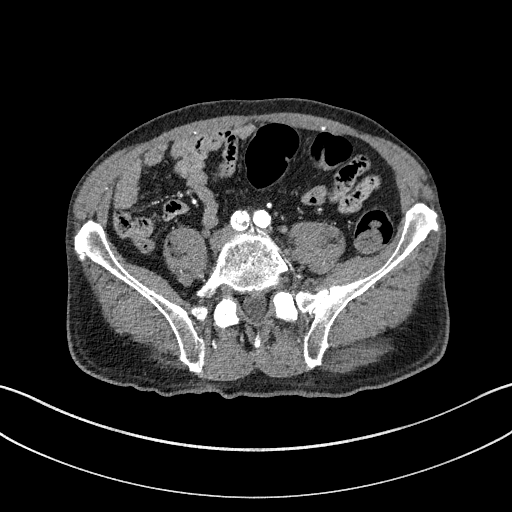
[im 157/274  soft-tissue]
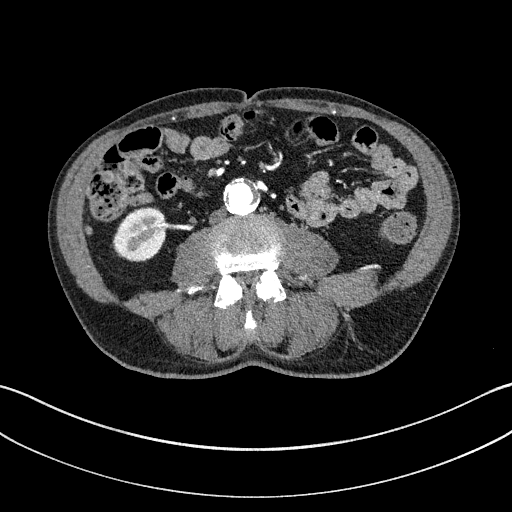
[im 183/274  soft-tissue]
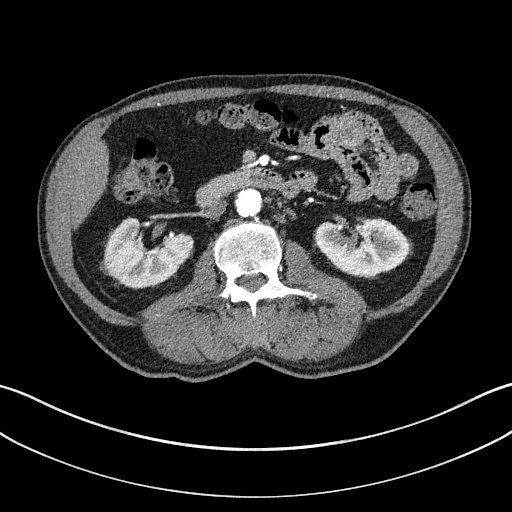
[im 209/274  soft-tissue]
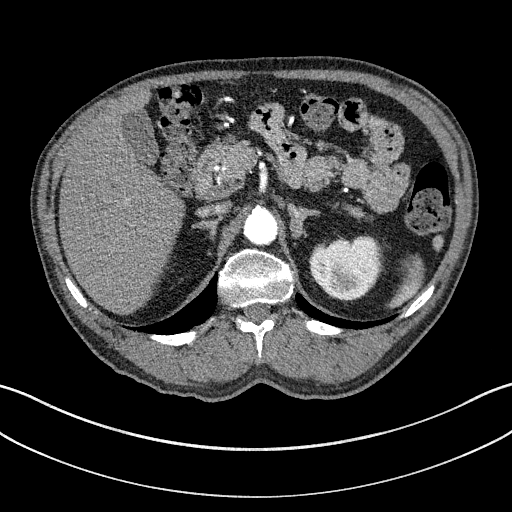
[im 235/274  soft-tissue]
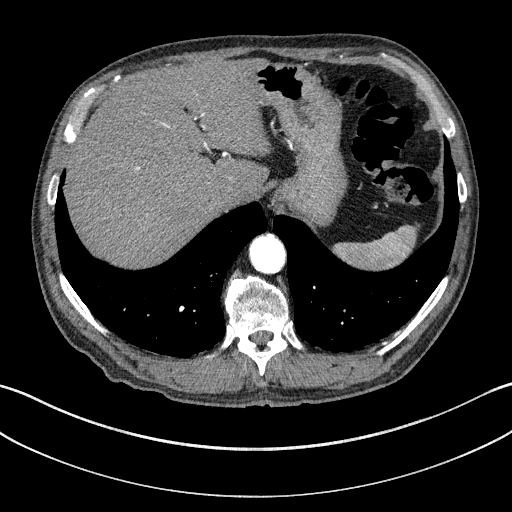
[im 235/274  bone]
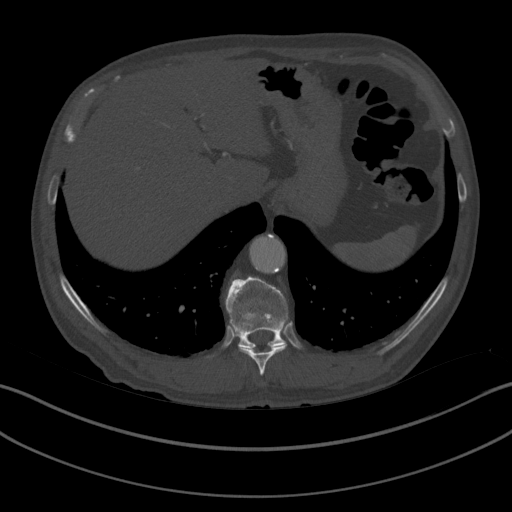
[im 261/274  soft-tissue]
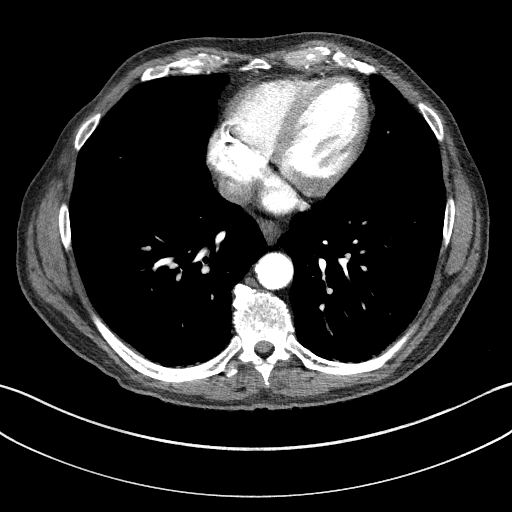

[Series 8: coronal arterial mpr · coronal · arterial · 0.82mm/px · 3 of 145 slices shown]
[im 29/145  soft-tissue]
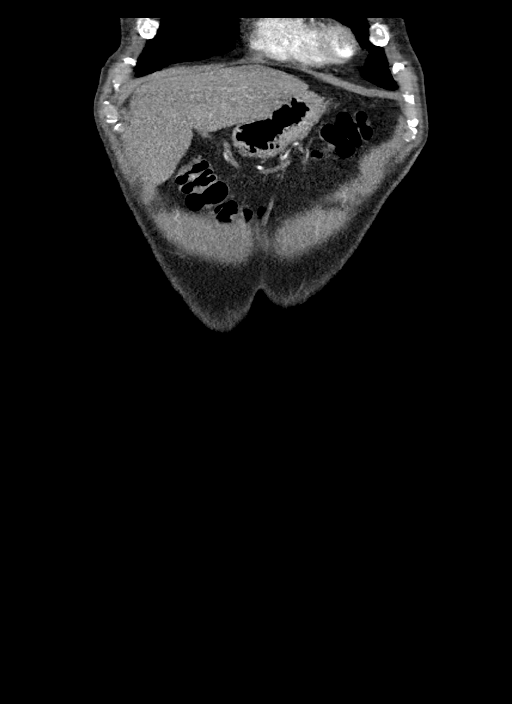
[im 58/145  soft-tissue]
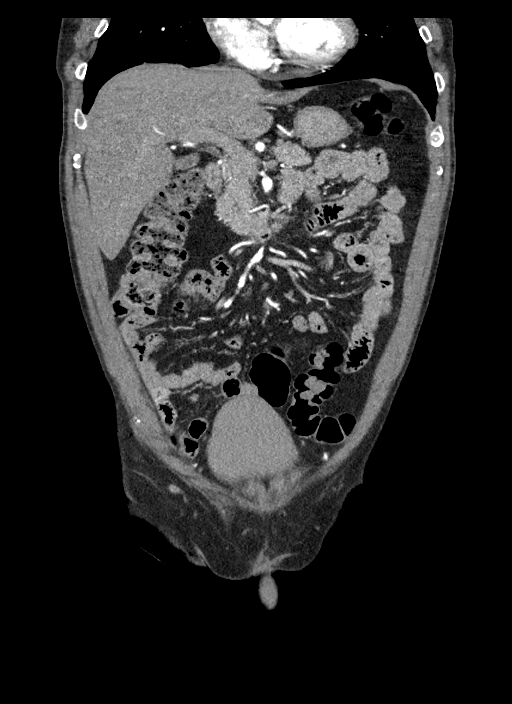
[im 87/145  soft-tissue]
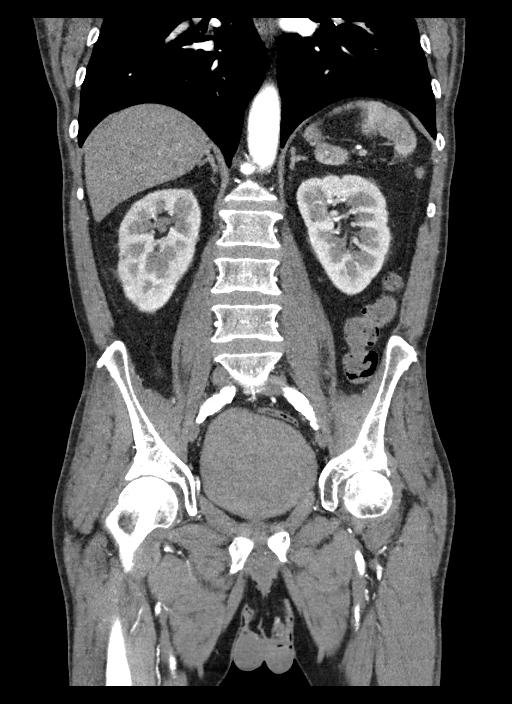

[13 of 46 positions shown; findings below may reference images not displayed]

FINDINGS: VASCULAR

Aorta: Extensive atherosclerotic plaque along the abdominal aorta.
Intermittent mild ectasia without aneurysmal dilation. Short segment
chronic dissection in the distal infrarenal abdominal aorta. The
dissection flap is calcified in the false lumen thrombosed. This
results in approximately 50% diameter reduction in the distal aorta.
The aorta remains patent.

Celiac: Minimal stenosis at the origin of the celiac artery.

SMA: Heavily calcified atherosclerotic plaque results in moderate to
high-grade stenosis of the origin of the superior mesenteric artery.

Renals: Accessory artery to the lower pole of the right kidney.
Heavily calcified atherosclerotic plaque results in moderate to
high-grade stenosis of the proximal left renal artery. Calcified
plaque obscures the origin of right renal artery. Suspect focal
high-grade stenosis.

IMA: Patent without evidence of aneurysm, dissection, vasculitis or
significant stenosis.

Inflow: Extensive heavily calcified atherosclerotic plaque
throughout both iliac arteries resulting in high-grade stenosis at
the origin on the left, moderate stenosis at the origin on the right
and then critical stenosis bordering on occlusion in the mid common
iliac arteries bilaterally. The heavily calcified atherosclerotic
plaque extends into the internal iliac arteries bilaterally as well
as into the origins of the external iliac arteries.

Proximal Outflow: Heavily calcified atherosclerotic plaque in both
common femoral arteries resulting in significant stenosis on the
left. The profunda femoral branches appear patent bilaterally.
Critical stenosis of the origin of the right superficial femoral
artery. The artery then remains patent. Chronic total occlusion of
the left superficial femoral artery.

Veins: No obvious venous abnormality within the limitations of this
arterial phase study.

Review of the MIP images confirms the above findings.

NON-VASCULAR

Lower chest: No acute abnormality.

Hepatobiliary: No focal liver abnormality is seen. No gallstones,
gallbladder wall thickening, or biliary dilatation.

Pancreas: Unremarkable. No pancreatic ductal dilatation or
surrounding inflammatory changes.

Spleen: Normal in size without focal abnormality.

Adrenals/Urinary Tract: Adrenal glands are unremarkable. Kidneys are
normal, without renal calculi, focal lesion, or hydronephrosis.
Bladder is unremarkable.

Stomach/Bowel: Stomach is within normal limits. Appendix appears
normal. No evidence of bowel wall thickening, distention, or
inflammatory changes.

Lymphatic: No suspicious lymphadenopathy.

Reproductive: Prostatomegaly.

Other: No abdominal wall hernia or abnormality. No abdominopelvic
ascites.

Musculoskeletal: No acute or significant osseous findings.
IMPRESSION: VASCULAR

1. Advanced inflow (aortoiliac) and outflow (common and superficial
femoral) peripheral arterial disease secondary to heavily calcified
atherosclerotic plaque. Aortic Atherosclerosis ([72]-[72]).
2. Approximately 50% luminal loss in the distal abdominal aorta
secondary to a chronic calcified short segment dissection.
3. High-grade stenosis versus occlusion in the bilateral common
iliac artery secondary to bulky and heavily calcified
atherosclerotic plaque.
4. Left greater than right common femoral artery stenoses secondary
to bulky heavily calcified atherosclerotic plaque.
5. Heavily calcified plaque results in high-grade stenosis of the
proximal right superficial femoral artery. The artery remains
patent.
6. Chronic total occlusion of the left superficial femoral artery.
7. Moderate to high-grade stenosis at the origin of the superior
mesenteric artery secondary to heavily calcified atherosclerotic
plaque.
8. High-grade stenoses at the origins of both renal artery secondary
to heavily calcified atherosclerotic plaque.

NON-VASCULAR

1. No acute abnormality within the abdomen pelvis.
2. Marked prostatomegaly.

## 2020-06-25 SURGERY — LOWER EXTREMITY ANGIOGRAPHY
Anesthesia: Moderate Sedation | Site: Leg Lower | Laterality: Left

## 2020-06-25 MED ORDER — DIPHENHYDRAMINE HCL 50 MG/ML IJ SOLN
50.0000 mg | Freq: Once | INTRAMUSCULAR | Status: AC | PRN
  Administered 2020-06-25: 50 mg via INTRAVENOUS

## 2020-06-25 MED ORDER — FAMOTIDINE 20 MG PO TABS
ORAL_TABLET | ORAL | Status: AC
  Filled 2020-06-25: qty 2

## 2020-06-25 MED ORDER — IODIXANOL 320 MG/ML IV SOLN
INTRAVENOUS | Status: DC | PRN
  Administered 2020-06-25: 15 mL

## 2020-06-25 MED ORDER — MIDAZOLAM HCL 5 MG/5ML IJ SOLN
INTRAMUSCULAR | Status: AC
  Administered 2020-06-25: 2 mg
  Filled 2020-06-25: qty 5

## 2020-06-25 MED ORDER — FENTANYL CITRATE (PF) 100 MCG/2ML IJ SOLN
INTRAMUSCULAR | Status: DC | PRN
  Administered 2020-06-25: 25 ug via INTRAVENOUS

## 2020-06-25 MED ORDER — IOHEXOL 350 MG/ML SOLN
75.0000 mL | Freq: Once | INTRAVENOUS | Status: AC | PRN
  Administered 2020-06-25: 75 mL via INTRAVENOUS

## 2020-06-25 MED ORDER — METHYLPREDNISOLONE SODIUM SUCC 125 MG IJ SOLR
INTRAMUSCULAR | Status: AC
  Filled 2020-06-25: qty 2

## 2020-06-25 MED ORDER — HYDROCODONE-ACETAMINOPHEN 5-325 MG PO TABS: 1.0000 | ORAL_TABLET | Freq: Four times a day (QID) | ORAL | 0 refills | Status: AC | PRN

## 2020-06-25 MED ORDER — FENTANYL CITRATE (PF) 100 MCG/2ML IJ SOLN
INTRAMUSCULAR | Status: AC
  Administered 2020-06-25: 50 ug
  Filled 2020-06-25: qty 2

## 2020-06-25 MED ORDER — MIDAZOLAM HCL 2 MG/2ML IJ SOLN
INTRAMUSCULAR | Status: DC | PRN
  Administered 2020-06-25: 1 mg via INTRAVENOUS

## 2020-06-25 MED ORDER — FAMOTIDINE 20 MG PO TABS
40.0000 mg | ORAL_TABLET | Freq: Once | ORAL | Status: AC | PRN
  Administered 2020-06-25: 40 mg via ORAL

## 2020-06-25 MED ORDER — CEFAZOLIN SODIUM-DEXTROSE 2-4 GM/100ML-% IV SOLN
INTRAVENOUS | Status: AC
  Administered 2020-06-25: 2 g
  Filled 2020-06-25: qty 100

## 2020-06-25 MED ORDER — METHYLPREDNISOLONE SODIUM SUCC 125 MG IJ SOLR
125.0000 mg | Freq: Once | INTRAMUSCULAR | Status: AC | PRN
  Administered 2020-06-25: 125 mg via INTRAVENOUS

## 2020-06-25 MED ORDER — HYDROMORPHONE HCL 1 MG/ML IJ SOLN: 1.0000 mg | Freq: Once | INTRAMUSCULAR | Status: DC | PRN

## 2020-06-25 MED ORDER — HEPARIN SODIUM (PORCINE) 1000 UNIT/ML IJ SOLN
INTRAMUSCULAR | Status: AC
  Filled 2020-06-25: qty 1

## 2020-06-25 MED ORDER — DIPHENHYDRAMINE HCL 50 MG/ML IJ SOLN
INTRAMUSCULAR | Status: AC
  Filled 2020-06-25: qty 1

## 2020-06-25 MED ORDER — ONDANSETRON HCL 4 MG/2ML IJ SOLN: 4.0000 mg | Freq: Four times a day (QID) | INTRAMUSCULAR | Status: DC | PRN

## 2020-06-25 MED ORDER — CEFAZOLIN SODIUM-DEXTROSE 2-4 GM/100ML-% IV SOLN: 2.0000 g | Freq: Once | INTRAVENOUS | Status: DC

## 2020-06-25 MED ORDER — MIDAZOLAM HCL 2 MG/ML PO SYRP: 8.0000 mg | ORAL_SOLUTION | Freq: Once | ORAL | Status: DC | PRN

## 2020-06-25 MED ORDER — SODIUM CHLORIDE 0.9 % IV SOLN: INTRAVENOUS | Status: DC

## 2020-06-25 SURGICAL SUPPLY — 11 items
CANNULA 5F STIFF (CANNULA) ×2 IMPLANT
CATH ANGIO 5F PIGTAIL 65CM (CATHETERS) ×2 IMPLANT
CATH BEACON 5 .035 40 KMP TP (CATHETERS) ×1 IMPLANT
CATH BEACON 5 .038 40 KMP TP (CATHETERS) ×1
GLIDEWIRE ADV .035X260CM (WIRE) ×2 IMPLANT
PACK ANGIOGRAPHY (CUSTOM PROCEDURE TRAY) ×2 IMPLANT
SHEATH BRITE TIP 5FRX11 (SHEATH) ×2 IMPLANT
SYR MEDRAD MARK 7 150ML (SYRINGE) ×2 IMPLANT
TUBING CONTRAST HIGH PRESS 72 (TUBING) ×2 IMPLANT
WIRE GUIDERIGHT .035X150 (WIRE) ×2 IMPLANT
WIRE NITINOL .018 (WIRE) ×4 IMPLANT

## 2020-06-25 NOTE — Op Note (Signed)
[facility]  Percutaneous Study/Intervention Procedural Note   Date of Surgery: 06/25/2020  Surgeon(s):Aubriegh Minch    Assistants:none  Pre-operative Diagnosis: PAD with ulceration left lower extremity and pain on the right  Post-operative diagnosis:  Same  Procedure(s) Performed:             1.  Ultrasound guidance for vascular access right femoral artery             2.  Catheter placement into right common iliac artery from right femoral approach             3.  Aortogram (limited) and right lower extremity angiogram              EBL: 5 cc  Contrast: 15 cc  Fluoro Time: 5.0 minutes  Moderate Conscious Sedation Time: approximately 38 minutes using 3 mg of Versed and 75 mcg of Fentanyl              Indications:  Patient is a 65 y.o.male with pain bilaterally and non-healing ulceration on the left. The patient has noninvasive study showing severely reduced perfusion bilaterally worse on the left than the right. The patient is brought in for angiography for further evaluation and potential treatment.  Due to the limb threatening nature of the situation, angiogram was performed for attempted limb salvage. The patient is aware that if the procedure fails, amputation would be expected.  The patient also understands that even with successful revascularization, amputation may still be required due to the severity of the situation. Risks and benefits are discussed and informed consent is obtained.   Procedure:  The patient was identified and appropriate procedural time out was performed.  The patient was then placed supine on the table and prepped and draped in the usual sterile fashion. Moderate conscious sedation was administered during a face to face encounter with the patient throughout the procedure with my supervision of the RN administering medicines and monitoring the patient's vital signs, pulse oximetry, telemetry and mental status throughout from the start of the  procedure until the patient was taken to the recovery room. Ultrasound was used to evaluate the right common femoral artery.  It was heavily diseased.  A digital ultrasound image was acquired.  A Seldinger needle was used to access the right common femoral artery under direct ultrasound guidance and a permanent image was performed.  A 0.035 J wire was advanced and a 5Fr sheath was placed.   A wire and catheter would not pass beyond the mid right external iliac artery.  Imaging performed through the right femoral sheath showed occlusion of the right common femoral artery just below the access site with occlusion of the proximal SFA and some disease in the profunda femoris artery as well.  This was dense calcific stenosis.  The right iliac was also occluded.  I then used an advantage wire and a Kumpe catheter and got into the right external and then common iliac artery trying to cross the occluded right iliac vessels.  Imaging showed complete occlusion with no obvious flow in the aorta concerning for an occlusion.  I was not able to cross into the catheter into the aorta for further evaluation.  I then used a micropuncture needle and accessed the left common femoral artery to try to evaluate the left leg.  This was completely nonpulsatile and densely calcific.  I could visualize the needle in the common femoral artery but could never get a wire to pass to place  a micropuncture sheath.  It was clear that the left common femoral and iliacs were occluded as well.  I will order a CT scan to evaluate the patient for consideration for aortobifemoral bypass or other revascularization options.  I elected to terminate the procedure. The sheath was removed and pressure was held in the right groin with a sterile dressing placed. The patient was taken to the recovery room in stable condition having tolerated the procedure well.  Findings:               Aortogram:  Right iliac artery was occluded in both the external and common  iliac arteries.  No flow was seen in the aorta consistent with an occlusion, but I was able to cross to get a catheter into the aorta to further evaluate.             Right lower Extremity:  Occlusion of the right common femoral artery and proximal superficial femoral artery with dense calcific stenosis  Left lower extremity: The left common femoral artery and proximal SFA were occluded and imaging could not be performed as access was unobtainable and the densely calcific occluded common femoral artery superficial femoral artery.   Disposition: Patient was taken to the recovery room in stable condition having tolerated the procedure well.  Complications: None  Ivan Larsen 06/25/2020 11:45 AM   This note was created with Dragon Medical transcription system. Any errors in dictation are purely unintentional.

## 2020-06-25 NOTE — Interval H&P Note (Signed)
History and Physical Interval Note:  06/25/2020 9:22 AM  Ivan Larsen  has presented today for surgery, with the diagnosis of LT leg angio   BARD    ASO w ulceration  Covid Feb 8.  The various methods of treatment have been discussed with the patient and family. After consideration of risks, benefits and other options for treatment, the patient has consented to  Procedure(s): LOWER EXTREMITY ANGIOGRAPHY (Left) as a surgical intervention.  The patient's history has been reviewed, patient examined, no change in status, stable for surgery.  I have reviewed the patient's chart and labs.  Questions were answered to the patient's satisfaction.     Festus Barren

## 2020-06-25 NOTE — Progress Notes (Signed)
Dr. Wyn Quaker at bedside, CTA completed. Daughter will make fu appt

## 2020-06-26 ENCOUNTER — Encounter: Payer: Self-pay | Admitting: Vascular Surgery

## 2020-06-26 ENCOUNTER — Telehealth (INDEPENDENT_AMBULATORY_CARE_PROVIDER_SITE_OTHER): Payer: Self-pay

## 2020-06-26 NOTE — Telephone Encounter (Signed)
Spoke with the patient and gave him the information regarding his aorta bifemoral bypass versus bilateral femoral endarterectomies, bilateral iliac stents, and left SFA surgery with [facility] on 07-23-20 at [facility]. Patient is to arrive to [facility] on 07-23-2020 at 6:15 am and covid testing on 06/29/20 between 8-1 pm at the [facility]. Pre-surgical instructions were discussed and will be mailed.

## 2020-06-29 ENCOUNTER — Other Ambulatory Visit (INDEPENDENT_AMBULATORY_CARE_PROVIDER_SITE_OTHER): Payer: Self-pay | Admitting: Nurse Practitioner

## 2020-06-29 ENCOUNTER — Other Ambulatory Visit: Payer: Self-pay

## 2020-06-29 ENCOUNTER — Other Ambulatory Visit
Admission: RE | Admit: 2020-06-29 | Discharge: 2020-06-29 | Disposition: A | Discharge status: Home / Self Care | Payer: Medicare Other | Source: Ambulatory Visit | Attending: Vascular Surgery | Admitting: Vascular Surgery

## 2020-06-29 DIAGNOSIS — J9811 Atelectasis: Secondary | ICD-10-CM | POA: Diagnosis not present

## 2020-06-29 DIAGNOSIS — K6389 Other specified diseases of intestine: Secondary | ICD-10-CM | POA: Diagnosis not present

## 2020-06-29 DIAGNOSIS — E785 Hyperlipidemia, unspecified: Secondary | ICD-10-CM | POA: Diagnosis not present

## 2020-06-29 DIAGNOSIS — R0602 Shortness of breath: Secondary | ICD-10-CM | POA: Diagnosis not present

## 2020-06-29 DIAGNOSIS — I7025 Atherosclerosis of native arteries of other extremities with ulceration: Secondary | ICD-10-CM | POA: Diagnosis not present

## 2020-06-29 DIAGNOSIS — R197 Diarrhea, unspecified: Secondary | ICD-10-CM | POA: Diagnosis not present

## 2020-06-29 DIAGNOSIS — I5021 Acute systolic (congestive) heart failure: Secondary | ICD-10-CM | POA: Diagnosis not present

## 2020-06-29 DIAGNOSIS — Z9889 Other specified postprocedural states: Secondary | ICD-10-CM | POA: Diagnosis not present

## 2020-06-29 DIAGNOSIS — K920 Hematemesis: Secondary | ICD-10-CM | POA: Diagnosis not present

## 2020-06-29 DIAGNOSIS — J9690 Respiratory failure, unspecified, unspecified whether with hypoxia or hypercapnia: Secondary | ICD-10-CM | POA: Diagnosis not present

## 2020-06-29 DIAGNOSIS — K567 Ileus, unspecified: Secondary | ICD-10-CM | POA: Diagnosis not present

## 2020-06-29 DIAGNOSIS — N170 Acute kidney failure with tubular necrosis: Secondary | ICD-10-CM | POA: Diagnosis not present

## 2020-06-29 DIAGNOSIS — E875 Hyperkalemia: Secondary | ICD-10-CM | POA: Diagnosis not present

## 2020-06-29 DIAGNOSIS — I70244 Atherosclerosis of native arteries of left leg with ulceration of heel and midfoot: Secondary | ICD-10-CM | POA: Diagnosis not present

## 2020-06-29 DIAGNOSIS — G931 Anoxic brain damage, not elsewhere classified: Secondary | ICD-10-CM | POA: Diagnosis not present

## 2020-06-29 DIAGNOSIS — I70249 Atherosclerosis of native arteries of left leg with ulceration of unspecified site: Secondary | ICD-10-CM | POA: Diagnosis not present

## 2020-06-29 DIAGNOSIS — J9602 Acute respiratory failure with hypercapnia: Secondary | ICD-10-CM | POA: Diagnosis not present

## 2020-06-29 DIAGNOSIS — R4182 Altered mental status, unspecified: Secondary | ICD-10-CM | POA: Diagnosis not present

## 2020-06-29 DIAGNOSIS — E871 Hypo-osmolality and hyponatremia: Secondary | ICD-10-CM | POA: Diagnosis not present

## 2020-06-29 DIAGNOSIS — Z4682 Encounter for fitting and adjustment of non-vascular catheter: Secondary | ICD-10-CM | POA: Diagnosis not present

## 2020-06-29 DIAGNOSIS — Z66 Do not resuscitate: Secondary | ICD-10-CM | POA: Diagnosis not present

## 2020-06-29 DIAGNOSIS — E876 Hypokalemia: Secondary | ICD-10-CM | POA: Diagnosis not present

## 2020-06-29 DIAGNOSIS — R188 Other ascites: Secondary | ICD-10-CM | POA: Diagnosis not present

## 2020-06-29 DIAGNOSIS — I70239 Atherosclerosis of native arteries of right leg with ulceration of unspecified site: Secondary | ICD-10-CM | POA: Diagnosis not present

## 2020-06-29 DIAGNOSIS — I70221 Atherosclerosis of native arteries of extremities with rest pain, right leg: Secondary | ICD-10-CM | POA: Diagnosis not present

## 2020-06-29 DIAGNOSIS — L97909 Non-pressure chronic ulcer of unspecified part of unspecified lower leg with unspecified severity: Secondary | ICD-10-CM | POA: Diagnosis not present

## 2020-06-29 DIAGNOSIS — J9600 Acute respiratory failure, unspecified whether with hypoxia or hypercapnia: Secondary | ICD-10-CM | POA: Diagnosis not present

## 2020-06-29 DIAGNOSIS — R600 Localized edema: Secondary | ICD-10-CM | POA: Diagnosis not present

## 2020-06-29 DIAGNOSIS — D649 Anemia, unspecified: Secondary | ICD-10-CM | POA: Diagnosis not present

## 2020-06-29 DIAGNOSIS — J690 Pneumonitis due to inhalation of food and vomit: Secondary | ICD-10-CM | POA: Diagnosis not present

## 2020-06-29 DIAGNOSIS — I70299 Other atherosclerosis of native arteries of extremities, unspecified extremity: Secondary | ICD-10-CM | POA: Diagnosis not present

## 2020-06-29 DIAGNOSIS — R579 Shock, unspecified: Secondary | ICD-10-CM | POA: Diagnosis not present

## 2020-06-29 DIAGNOSIS — E46 Unspecified protein-calorie malnutrition: Secondary | ICD-10-CM | POA: Diagnosis present

## 2020-06-29 DIAGNOSIS — I469 Cardiac arrest, cause unspecified: Secondary | ICD-10-CM | POA: Diagnosis not present

## 2020-06-29 DIAGNOSIS — J9601 Acute respiratory failure with hypoxia: Secondary | ICD-10-CM | POA: Diagnosis not present

## 2020-06-29 DIAGNOSIS — N179 Acute kidney failure, unspecified: Secondary | ICD-10-CM | POA: Diagnosis not present

## 2020-06-29 DIAGNOSIS — R578 Other shock: Secondary | ICD-10-CM | POA: Diagnosis not present

## 2020-06-29 DIAGNOSIS — I700 Atherosclerosis of aorta: Secondary | ICD-10-CM | POA: Diagnosis not present

## 2020-06-29 DIAGNOSIS — D62 Acute posthemorrhagic anemia: Secondary | ICD-10-CM | POA: Diagnosis not present

## 2020-06-29 DIAGNOSIS — J189 Pneumonia, unspecified organism: Secondary | ICD-10-CM | POA: Diagnosis not present

## 2020-06-29 DIAGNOSIS — E870 Hyperosmolality and hypernatremia: Secondary | ICD-10-CM | POA: Diagnosis not present

## 2020-06-29 DIAGNOSIS — Z20822 Contact with and (suspected) exposure to covid-19: Secondary | ICD-10-CM | POA: Insufficient documentation

## 2020-06-29 DIAGNOSIS — L97429 Non-pressure chronic ulcer of left heel and midfoot with unspecified severity: Secondary | ICD-10-CM | POA: Diagnosis not present

## 2020-06-29 DIAGNOSIS — Z01812 Encounter for preprocedural laboratory examination: Secondary | ICD-10-CM | POA: Insufficient documentation

## 2020-06-29 DIAGNOSIS — I959 Hypotension, unspecified: Secondary | ICD-10-CM | POA: Diagnosis not present

## 2020-06-29 DIAGNOSIS — I468 Cardiac arrest due to other underlying condition: Secondary | ICD-10-CM | POA: Diagnosis not present

## 2020-06-29 DIAGNOSIS — Z978 Presence of other specified devices: Secondary | ICD-10-CM | POA: Diagnosis not present

## 2020-06-29 DIAGNOSIS — D631 Anemia in chronic kidney disease: Secondary | ICD-10-CM | POA: Diagnosis not present

## 2020-06-29 DIAGNOSIS — Z515 Encounter for palliative care: Secondary | ICD-10-CM | POA: Diagnosis not present

## 2020-06-29 DIAGNOSIS — N171 Acute kidney failure with acute cortical necrosis: Secondary | ICD-10-CM | POA: Diagnosis not present

## 2020-06-29 DIAGNOSIS — E11621 Type 2 diabetes mellitus with foot ulcer: Secondary | ICD-10-CM | POA: Diagnosis present

## 2020-06-29 DIAGNOSIS — I4581 Long QT syndrome: Secondary | ICD-10-CM | POA: Diagnosis not present

## 2020-06-29 DIAGNOSIS — Z79899 Other long term (current) drug therapy: Secondary | ICD-10-CM | POA: Diagnosis not present

## 2020-06-29 DIAGNOSIS — G939 Disorder of brain, unspecified: Secondary | ICD-10-CM | POA: Diagnosis not present

## 2020-06-29 DIAGNOSIS — Z7189 Other specified counseling: Secondary | ICD-10-CM | POA: Diagnosis not present

## 2020-06-29 DIAGNOSIS — R1110 Vomiting, unspecified: Secondary | ICD-10-CM | POA: Diagnosis not present

## 2020-06-29 DIAGNOSIS — E874 Mixed disorder of acid-base balance: Secondary | ICD-10-CM | POA: Diagnosis not present

## 2020-06-29 DIAGNOSIS — R918 Other nonspecific abnormal finding of lung field: Secondary | ICD-10-CM | POA: Diagnosis not present

## 2020-06-29 DIAGNOSIS — Z452 Encounter for adjustment and management of vascular access device: Secondary | ICD-10-CM | POA: Diagnosis not present

## 2020-06-29 DIAGNOSIS — E1151 Type 2 diabetes mellitus with diabetic peripheral angiopathy without gangrene: Secondary | ICD-10-CM | POA: Diagnosis not present

## 2020-06-29 DIAGNOSIS — R609 Edema, unspecified: Secondary | ICD-10-CM | POA: Diagnosis not present

## 2020-06-29 DIAGNOSIS — I7092 Chronic total occlusion of artery of the extremities: Secondary | ICD-10-CM | POA: Diagnosis present

## 2020-06-29 DIAGNOSIS — E872 Acidosis: Secondary | ICD-10-CM | POA: Diagnosis not present

## 2020-06-29 DIAGNOSIS — G928 Other toxic encephalopathy: Secondary | ICD-10-CM | POA: Diagnosis not present

## 2020-06-29 DIAGNOSIS — D72823 Leukemoid reaction: Secondary | ICD-10-CM | POA: Diagnosis not present

## 2020-06-29 DIAGNOSIS — Z136 Encounter for screening for cardiovascular disorders: Secondary | ICD-10-CM | POA: Diagnosis not present

## 2020-06-29 DIAGNOSIS — I214 Non-ST elevation (NSTEMI) myocardial infarction: Secondary | ICD-10-CM | POA: Diagnosis not present

## 2020-06-29 DIAGNOSIS — Z95828 Presence of other vascular implants and grafts: Secondary | ICD-10-CM | POA: Diagnosis not present

## 2020-06-29 DIAGNOSIS — J90 Pleural effusion, not elsewhere classified: Secondary | ICD-10-CM | POA: Diagnosis not present

## 2020-06-29 DIAGNOSIS — R945 Abnormal results of liver function studies: Secondary | ICD-10-CM | POA: Diagnosis not present

## 2020-06-29 DIAGNOSIS — R0902 Hypoxemia: Secondary | ICD-10-CM | POA: Diagnosis not present

## 2020-06-30 ENCOUNTER — Ambulatory Visit: Payer: Medicare Other | Admitting: Nurse Practitioner

## 2020-06-30 LAB — SARS CORONAVIRUS 2 (TAT 6-24 HRS): SARS Coronavirus 2: NEGATIVE

## 2020-06-30 MED ORDER — HYDROMORPHONE HCL 1 MG/ML IJ SOLN
1.0000 mg | Freq: Once | INTRAMUSCULAR | Status: DC | PRN
Start: 2020-06-30 — End: 2020-07-06

## 2020-06-30 MED ORDER — ONDANSETRON HCL 4 MG/2ML IJ SOLN
4.0000 mg | Freq: Four times a day (QID) | INTRAMUSCULAR | Status: DC | PRN
  Administered 2020-07-07 (×2): 4 mg via INTRAVENOUS
  Filled 2020-06-30 (×3): qty 2

## 2020-06-30 MED ORDER — CHLORHEXIDINE GLUCONATE CLOTH 2 % EX PADS: 6.0000 | MEDICATED_PAD | Freq: Once | CUTANEOUS | Status: DC

## 2020-06-30 MED ORDER — CHLORHEXIDINE GLUCONATE CLOTH 2 % EX PADS
6.0000 | MEDICATED_PAD | Freq: Once | CUTANEOUS | Status: AC
  Administered 2020-07-01: 6 via TOPICAL

## 2020-06-30 MED ORDER — CEFAZOLIN SODIUM-DEXTROSE 2-4 GM/100ML-% IV SOLN
2.0000 g | INTRAVENOUS | Status: AC
  Administered 2020-07-01 (×2): 2 g via INTRAVENOUS

## 2020-06-30 MED ORDER — SODIUM CHLORIDE 0.9 % IV SOLN: INTRAVENOUS | Status: DC

## 2020-07-01 ENCOUNTER — Inpatient Hospital Stay: Payer: Medicare Other

## 2020-07-01 ENCOUNTER — Inpatient Hospital Stay: Payer: Medicare Other | Admitting: Urgent Care

## 2020-07-01 ENCOUNTER — Inpatient Hospital Stay
Admission: RE | Admit: 2020-07-01 | Discharge: 2020-08-14 | DRG: 270 | Disposition: E | Payer: Medicare Other | Attending: Internal Medicine | Admitting: Internal Medicine

## 2020-07-01 ENCOUNTER — Encounter: Payer: Self-pay | Admitting: Vascular Surgery

## 2020-07-01 ENCOUNTER — Encounter: Admission: RE | Disposition: E | Payer: Self-pay | Source: Home / Self Care | Attending: Vascular Surgery

## 2020-07-01 ENCOUNTER — Other Ambulatory Visit: Payer: Self-pay

## 2020-07-01 ENCOUNTER — Ambulatory Visit: Payer: Medicare Other | Admitting: Internal Medicine

## 2020-07-01 DIAGNOSIS — Z66 Do not resuscitate: Secondary | ICD-10-CM | POA: Diagnosis not present

## 2020-07-01 DIAGNOSIS — J9602 Acute respiratory failure with hypercapnia: Secondary | ICD-10-CM | POA: Diagnosis not present

## 2020-07-01 DIAGNOSIS — G939 Disorder of brain, unspecified: Secondary | ICD-10-CM | POA: Diagnosis not present

## 2020-07-01 DIAGNOSIS — Z95828 Presence of other vascular implants and grafts: Secondary | ICD-10-CM

## 2020-07-01 DIAGNOSIS — L97429 Non-pressure chronic ulcer of left heel and midfoot with unspecified severity: Secondary | ICD-10-CM | POA: Diagnosis present

## 2020-07-01 DIAGNOSIS — R197 Diarrhea, unspecified: Secondary | ICD-10-CM | POA: Diagnosis not present

## 2020-07-01 DIAGNOSIS — E874 Mixed disorder of acid-base balance: Secondary | ICD-10-CM | POA: Diagnosis not present

## 2020-07-01 DIAGNOSIS — E11621 Type 2 diabetes mellitus with foot ulcer: Secondary | ICD-10-CM | POA: Diagnosis present

## 2020-07-01 DIAGNOSIS — E1142 Type 2 diabetes mellitus with diabetic polyneuropathy: Secondary | ICD-10-CM | POA: Diagnosis present

## 2020-07-01 DIAGNOSIS — K567 Ileus, unspecified: Secondary | ICD-10-CM | POA: Diagnosis not present

## 2020-07-01 DIAGNOSIS — E1169 Type 2 diabetes mellitus with other specified complication: Secondary | ICD-10-CM | POA: Diagnosis present

## 2020-07-01 DIAGNOSIS — D62 Acute posthemorrhagic anemia: Secondary | ICD-10-CM | POA: Diagnosis not present

## 2020-07-01 DIAGNOSIS — Z87891 Personal history of nicotine dependence: Secondary | ICD-10-CM

## 2020-07-01 DIAGNOSIS — I2510 Atherosclerotic heart disease of native coronary artery without angina pectoris: Secondary | ICD-10-CM | POA: Diagnosis present

## 2020-07-01 DIAGNOSIS — I214 Non-ST elevation (NSTEMI) myocardial infarction: Secondary | ICD-10-CM | POA: Diagnosis not present

## 2020-07-01 DIAGNOSIS — I70299 Other atherosclerosis of native arteries of extremities, unspecified extremity: Secondary | ICD-10-CM | POA: Diagnosis not present

## 2020-07-01 DIAGNOSIS — J9601 Acute respiratory failure with hypoxia: Secondary | ICD-10-CM | POA: Diagnosis not present

## 2020-07-01 DIAGNOSIS — I468 Cardiac arrest due to other underlying condition: Secondary | ICD-10-CM

## 2020-07-01 DIAGNOSIS — J690 Pneumonitis due to inhalation of food and vomit: Secondary | ICD-10-CM | POA: Diagnosis not present

## 2020-07-01 DIAGNOSIS — I252 Old myocardial infarction: Secondary | ICD-10-CM

## 2020-07-01 DIAGNOSIS — D72823 Leukemoid reaction: Secondary | ICD-10-CM | POA: Diagnosis not present

## 2020-07-01 DIAGNOSIS — Z79899 Other long term (current) drug therapy: Secondary | ICD-10-CM

## 2020-07-01 DIAGNOSIS — I70244 Atherosclerosis of native arteries of left leg with ulceration of heel and midfoot: Secondary | ICD-10-CM | POA: Diagnosis present

## 2020-07-01 DIAGNOSIS — N170 Acute kidney failure with tubular necrosis: Secondary | ICD-10-CM | POA: Diagnosis not present

## 2020-07-01 DIAGNOSIS — D649 Anemia, unspecified: Secondary | ICD-10-CM | POA: Diagnosis not present

## 2020-07-01 DIAGNOSIS — D72829 Elevated white blood cell count, unspecified: Secondary | ICD-10-CM | POA: Diagnosis not present

## 2020-07-01 DIAGNOSIS — E870 Hyperosmolality and hypernatremia: Secondary | ICD-10-CM | POA: Diagnosis present

## 2020-07-01 DIAGNOSIS — J189 Pneumonia, unspecified organism: Secondary | ICD-10-CM | POA: Diagnosis not present

## 2020-07-01 DIAGNOSIS — Z91041 Radiographic dye allergy status: Secondary | ICD-10-CM

## 2020-07-01 DIAGNOSIS — R945 Abnormal results of liver function studies: Secondary | ICD-10-CM

## 2020-07-01 DIAGNOSIS — N171 Acute kidney failure with acute cortical necrosis: Secondary | ICD-10-CM

## 2020-07-01 DIAGNOSIS — E1151 Type 2 diabetes mellitus with diabetic peripheral angiopathy without gangrene: Principal | ICD-10-CM | POA: Diagnosis present

## 2020-07-01 DIAGNOSIS — Z888 Allergy status to other drugs, medicaments and biological substances status: Secondary | ICD-10-CM

## 2020-07-01 DIAGNOSIS — G931 Anoxic brain damage, not elsewhere classified: Secondary | ICD-10-CM | POA: Diagnosis not present

## 2020-07-01 DIAGNOSIS — E875 Hyperkalemia: Secondary | ICD-10-CM | POA: Diagnosis not present

## 2020-07-01 DIAGNOSIS — Z978 Presence of other specified devices: Secondary | ICD-10-CM

## 2020-07-01 DIAGNOSIS — G928 Other toxic encephalopathy: Secondary | ICD-10-CM | POA: Diagnosis not present

## 2020-07-01 DIAGNOSIS — E46 Unspecified protein-calorie malnutrition: Secondary | ICD-10-CM | POA: Diagnosis present

## 2020-07-01 DIAGNOSIS — L97909 Non-pressure chronic ulcer of unspecified part of unspecified lower leg with unspecified severity: Secondary | ICD-10-CM | POA: Diagnosis present

## 2020-07-01 DIAGNOSIS — Z833 Family history of diabetes mellitus: Secondary | ICD-10-CM

## 2020-07-01 DIAGNOSIS — Z4659 Encounter for fitting and adjustment of other gastrointestinal appliance and device: Secondary | ICD-10-CM

## 2020-07-01 DIAGNOSIS — Z452 Encounter for adjustment and management of vascular access device: Secondary | ICD-10-CM

## 2020-07-01 DIAGNOSIS — R54 Age-related physical debility: Secondary | ICD-10-CM | POA: Diagnosis present

## 2020-07-01 DIAGNOSIS — B9620 Unspecified Escherichia coli [E. coli] as the cause of diseases classified elsewhere: Secondary | ICD-10-CM | POA: Diagnosis not present

## 2020-07-01 DIAGNOSIS — I5021 Acute systolic (congestive) heart failure: Secondary | ICD-10-CM | POA: Diagnosis not present

## 2020-07-01 DIAGNOSIS — L89620 Pressure ulcer of left heel, unstageable: Secondary | ICD-10-CM | POA: Diagnosis present

## 2020-07-01 DIAGNOSIS — E876 Hypokalemia: Secondary | ICD-10-CM | POA: Diagnosis not present

## 2020-07-01 DIAGNOSIS — L89316 Pressure-induced deep tissue damage of right buttock: Secondary | ICD-10-CM | POA: Clinically undetermined

## 2020-07-01 DIAGNOSIS — Z20822 Contact with and (suspected) exposure to covid-19: Secondary | ICD-10-CM | POA: Diagnosis present

## 2020-07-01 DIAGNOSIS — I708 Atherosclerosis of other arteries: Secondary | ICD-10-CM | POA: Diagnosis present

## 2020-07-01 DIAGNOSIS — R188 Other ascites: Secondary | ICD-10-CM | POA: Diagnosis present

## 2020-07-01 DIAGNOSIS — R0602 Shortness of breath: Secondary | ICD-10-CM

## 2020-07-01 DIAGNOSIS — Z515 Encounter for palliative care: Secondary | ICD-10-CM

## 2020-07-01 DIAGNOSIS — I701 Atherosclerosis of renal artery: Secondary | ICD-10-CM | POA: Diagnosis present

## 2020-07-01 DIAGNOSIS — E1165 Type 2 diabetes mellitus with hyperglycemia: Secondary | ICD-10-CM | POA: Diagnosis not present

## 2020-07-01 DIAGNOSIS — I7092 Chronic total occlusion of artery of the extremities: Secondary | ICD-10-CM | POA: Diagnosis present

## 2020-07-01 DIAGNOSIS — R578 Other shock: Secondary | ICD-10-CM | POA: Diagnosis not present

## 2020-07-01 DIAGNOSIS — R0902 Hypoxemia: Secondary | ICD-10-CM

## 2020-07-01 DIAGNOSIS — I70221 Atherosclerosis of native arteries of extremities with rest pain, right leg: Secondary | ICD-10-CM | POA: Diagnosis present

## 2020-07-01 DIAGNOSIS — Z955 Presence of coronary angioplasty implant and graft: Secondary | ICD-10-CM

## 2020-07-01 DIAGNOSIS — K920 Hematemesis: Secondary | ICD-10-CM | POA: Diagnosis not present

## 2020-07-01 DIAGNOSIS — Z91013 Allergy to seafood: Secondary | ICD-10-CM

## 2020-07-01 DIAGNOSIS — K219 Gastro-esophageal reflux disease without esophagitis: Secondary | ICD-10-CM | POA: Diagnosis present

## 2020-07-01 DIAGNOSIS — Z87442 Personal history of urinary calculi: Secondary | ICD-10-CM

## 2020-07-01 DIAGNOSIS — I469 Cardiac arrest, cause unspecified: Secondary | ICD-10-CM | POA: Diagnosis not present

## 2020-07-01 DIAGNOSIS — I700 Atherosclerosis of aorta: Secondary | ICD-10-CM | POA: Diagnosis present

## 2020-07-01 DIAGNOSIS — R609 Edema, unspecified: Secondary | ICD-10-CM

## 2020-07-01 DIAGNOSIS — R1110 Vomiting, unspecified: Secondary | ICD-10-CM

## 2020-07-01 DIAGNOSIS — N179 Acute kidney failure, unspecified: Secondary | ICD-10-CM

## 2020-07-01 DIAGNOSIS — R579 Shock, unspecified: Secondary | ICD-10-CM | POA: Diagnosis not present

## 2020-07-01 DIAGNOSIS — Z6829 Body mass index (BMI) 29.0-29.9, adult: Secondary | ICD-10-CM

## 2020-07-01 DIAGNOSIS — R000 Tachycardia, unspecified: Secondary | ICD-10-CM | POA: Diagnosis not present

## 2020-07-01 DIAGNOSIS — Z7984 Long term (current) use of oral hypoglycemic drugs: Secondary | ICD-10-CM

## 2020-07-01 DIAGNOSIS — E785 Hyperlipidemia, unspecified: Secondary | ICD-10-CM | POA: Diagnosis present

## 2020-07-01 DIAGNOSIS — Z7902 Long term (current) use of antithrombotics/antiplatelets: Secondary | ICD-10-CM

## 2020-07-01 DIAGNOSIS — Z8249 Family history of ischemic heart disease and other diseases of the circulatory system: Secondary | ICD-10-CM

## 2020-07-01 DIAGNOSIS — I152 Hypertension secondary to endocrine disorders: Secondary | ICD-10-CM | POA: Diagnosis present

## 2020-07-01 DIAGNOSIS — Z7189 Other specified counseling: Secondary | ICD-10-CM | POA: Diagnosis not present

## 2020-07-01 HISTORY — PX: AORTA - BILATERAL FEMORAL ARTERY BYPASS GRAFT: SHX1175

## 2020-07-01 LAB — CBC
HCT: 24.7 % — ABNORMAL LOW (ref 39.0–52.0)
HCT: 20.5 % — ABNORMAL LOW (ref 39.0–52.0)
Hemoglobin: 8.3 g/dL — ABNORMAL LOW (ref 13.0–17.0)
Hemoglobin: 6.6 g/dL — ABNORMAL LOW (ref 13.0–17.0)
MCH: 29.4 pg (ref 26.0–34.0)
MCH: 29.5 pg (ref 26.0–34.0)
MCHC: 33.6 g/dL (ref 30.0–36.0)
MCHC: 32.2 g/dL (ref 30.0–36.0)
MCV: 87.6 fL (ref 80.0–100.0)
MCV: 91.5 fL (ref 80.0–100.0)
Platelets: 216 10*3/uL (ref 150–400)
Platelets: 246 10*3/uL (ref 150–400)
RBC: 2.82 MIL/uL — ABNORMAL LOW (ref 4.22–5.81)
RBC: 2.24 MIL/uL — ABNORMAL LOW (ref 4.22–5.81)
RDW: 13.8 % (ref 11.5–15.5)
RDW: 14.4 % (ref 11.5–15.5)
WBC: 21.9 10*3/uL — ABNORMAL HIGH (ref 4.0–10.5)
WBC: 24.6 10*3/uL — ABNORMAL HIGH (ref 4.0–10.5)
nRBC: 0 % (ref 0.0–0.2)
nRBC: 0.1 % (ref 0.0–0.2)

## 2020-07-01 LAB — BASIC METABOLIC PANEL
Anion gap: 14 (ref 5–15)
Anion gap: 24 — ABNORMAL HIGH (ref 5–15)
BUN: 21 mg/dL (ref 8–23)
BUN: 18 mg/dL (ref 8–23)
CO2: 26 mmol/L (ref 22–32)
CO2: 14 mmol/L — ABNORMAL LOW (ref 22–32)
Calcium: 9.7 mg/dL (ref 8.9–10.3)
Calcium: 6.4 mg/dL — CL (ref 8.9–10.3)
Chloride: 100 mmol/L (ref 98–111)
Chloride: 108 mmol/L (ref 98–111)
Creatinine, Ser: 0.91 mg/dL (ref 0.61–1.24)
Creatinine, Ser: 1.45 mg/dL — ABNORMAL HIGH (ref 0.61–1.24)
GFR, Estimated: >60 mL/min (ref >60)
GFR, Estimated: 53 mL/min — ABNORMAL LOW (ref >60)
Glucose, Bld: 176 mg/dL — ABNORMAL HIGH (ref 70–99)
Glucose, Bld: 219 mg/dL — ABNORMAL HIGH (ref 70–99)
Potassium: 3.8 mmol/L (ref 3.5–5.1)
Potassium: 3.4 mmol/L — ABNORMAL LOW (ref 3.5–5.1)
Sodium: 140 mmol/L (ref 135–145)
Sodium: 146 mmol/L — ABNORMAL HIGH (ref 135–145)

## 2020-07-01 LAB — BLOOD GAS, ARTERIAL
Acid-base deficit: 14.7 mmol/L — ABNORMAL HIGH (ref 0.0–2.0)
Bicarbonate: 12.9 mmol/L — ABNORMAL LOW (ref 20.0–28.0)
FIO2: 0.5
MECHVT: 500 mL
O2 Saturation: 99.5 %
PEEP: 5 cmH2O
Patient temperature: 37
RATE: 18 resp/min
pCO2 arterial: 37 mmHg (ref 32.0–48.0)
pH, Arterial: 7.15 — CL (ref 7.350–7.450)
pO2, Arterial: 206 mmHg — ABNORMAL HIGH (ref 83.0–108.0)

## 2020-07-01 LAB — CBC WITH DIFFERENTIAL/PLATELET
Abs Immature Granulocytes: 0.08 10*3/uL — ABNORMAL HIGH (ref 0.00–0.07)
Basophils Absolute: 0.1 10*3/uL (ref 0.0–0.1)
Basophils Relative: 1 %
Eosinophils Absolute: 0.4 10*3/uL (ref 0.0–0.5)
Eosinophils Relative: 3 %
HCT: 41.1 % (ref 39.0–52.0)
Hemoglobin: 13.7 g/dL (ref 13.0–17.0)
Immature Granulocytes: 1 %
Lymphocytes Relative: 16 %
Lymphs Abs: 2.2 10*3/uL (ref 0.7–4.0)
MCH: 28.5 pg (ref 26.0–34.0)
MCHC: 33.3 g/dL (ref 30.0–36.0)
MCV: 85.6 fL (ref 80.0–100.0)
Monocytes Absolute: 1.1 10*3/uL — ABNORMAL HIGH (ref 0.1–1.0)
Monocytes Relative: 9 %
Neutro Abs: 9.5 10*3/uL — ABNORMAL HIGH (ref 1.7–7.7)
Neutrophils Relative %: 70 %
Platelets: 290 10*3/uL (ref 150–400)
RBC: 4.8 MIL/uL (ref 4.22–5.81)
RDW: 14.1 % (ref 11.5–15.5)
WBC: 13.3 10*3/uL — ABNORMAL HIGH (ref 4.0–10.5)
nRBC: 0 % (ref 0.0–0.2)

## 2020-07-01 LAB — ABO/RH: ABO/RH(D): A POS

## 2020-07-01 LAB — PREPARE RBC (CROSSMATCH)

## 2020-07-01 LAB — MRSA PCR SCREENING: MRSA by PCR: NEGATIVE

## 2020-07-01 LAB — LACTIC ACID, PLASMA
Lactic Acid, Venous: >11 mmol/L (ref 0.5–1.9)
Lactic Acid, Venous: >11 mmol/L (ref 0.5–1.9)

## 2020-07-01 LAB — PROTIME-INR
INR: 1 (ref 0.8–1.2)
Prothrombin Time: 13.2 seconds (ref 11.4–15.2)

## 2020-07-01 LAB — HEMOGLOBIN AND HEMATOCRIT, BLOOD
HCT: 19.2 % — ABNORMAL LOW (ref 39.0–52.0)
Hemoglobin: 6.1 g/dL — ABNORMAL LOW (ref 13.0–17.0)

## 2020-07-01 LAB — PROCALCITONIN: Procalcitonin: 0.12 ng/mL

## 2020-07-01 LAB — APTT: aPTT: 31 seconds (ref 24–36)

## 2020-07-01 LAB — GLUCOSE, CAPILLARY
Glucose-Capillary: 170 mg/dL — ABNORMAL HIGH (ref 70–99)
Glucose-Capillary: 207 mg/dL — ABNORMAL HIGH (ref 70–99)

## 2020-07-01 IMAGING — DX DG CHEST 1V PORT
1 series · 1 of 1 positions shown · non-contrast
Comparison: [DATE]

CLINICAL DATA: Central line placement, postop

EXAM:
PORTABLE CHEST 1 VIEW

[chest ap]
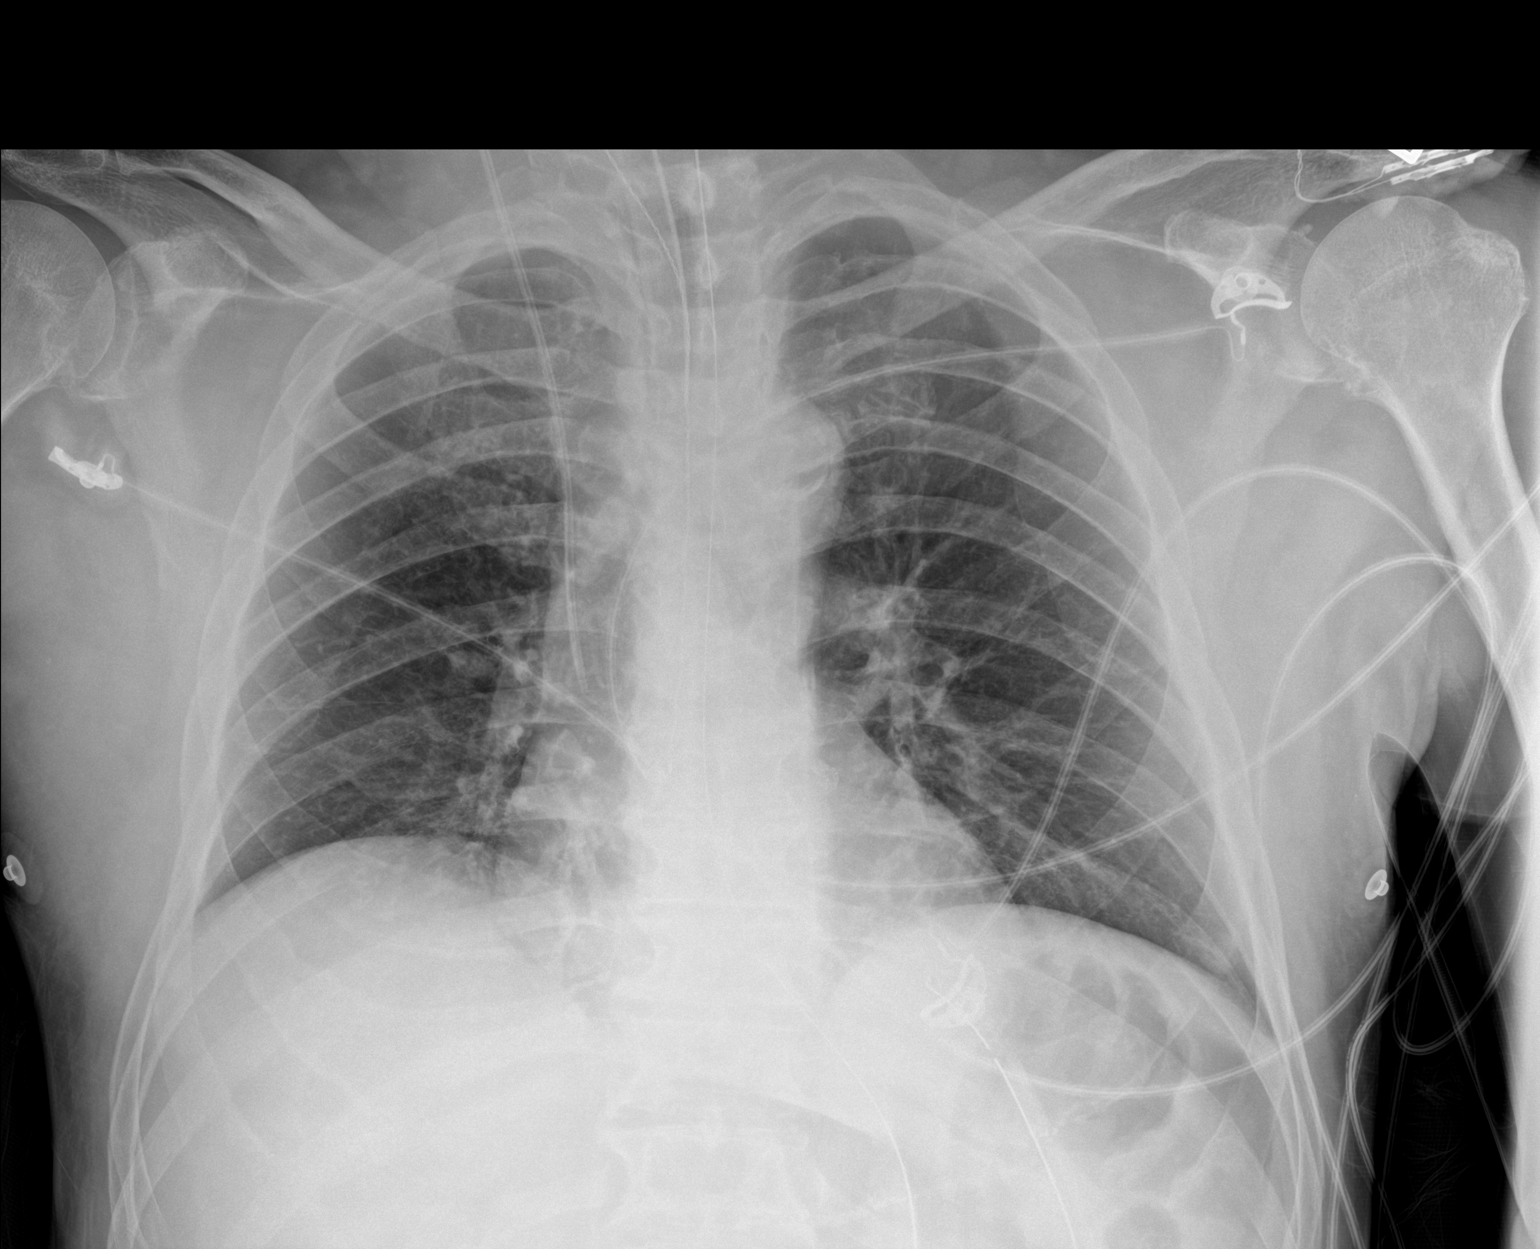

[1 of 1 positions shown; findings below may reference images not displayed]

FINDINGS: Single frontal view of the chest demonstrates endotracheal tube
overlying tracheal air column tip at level of thoracic inlet.
Enteric catheter passes below diaphragm tip and side port projecting
over gastric fundus. Esophageal temperature probe overlies the lower
thoracic esophagus. Right internal jugular central venous catheter
overlies superior vena cava. Cardiac silhouette is unremarkable. No
airspace disease, effusion, or pneumothorax. No acute bony
abnormalities.
IMPRESSION: 1. Support devices as above.  No evidence of malpositioning.
2. No acute intrathoracic process.

## 2020-07-01 SURGERY — CREATION, BYPASS, ARTERIAL, AORTA TO FEMORAL, BILATERAL, USING GRAFT
Anesthesia: General

## 2020-07-01 MED ORDER — FENTANYL CITRATE (PF) 100 MCG/2ML IJ SOLN
INTRAMUSCULAR | Status: DC | PRN
  Administered 2020-07-01: 75 ug via INTRAVENOUS
  Administered 2020-07-01: 25 ug via INTRAVENOUS
  Administered 2020-07-01: 50 ug via INTRAVENOUS
  Administered 2020-07-01 (×2): 25 ug via INTRAVENOUS
  Administered 2020-07-01: 50 ug via INTRAVENOUS

## 2020-07-01 MED ORDER — POTASSIUM CHLORIDE 20 MEQ PO PACK
40.0000 meq | PACK | Freq: Two times a day (BID) | ORAL | Status: DC
  Administered 2020-07-02 – 2020-07-03 (×4): 40 meq
  Filled 2020-07-01 (×4): qty 2

## 2020-07-01 MED ORDER — MAGNESIUM SULFATE 2 GM/50ML IV SOLN: 2.0000 g | Freq: Every day | INTRAVENOUS | Status: DC | PRN

## 2020-07-01 MED ORDER — GABAPENTIN 300 MG PO CAPS
300.0000 mg | ORAL_CAPSULE | Freq: Three times a day (TID) | ORAL | Status: DC
  Administered 2020-07-01 – 2020-07-02 (×2): 300 mg via ORAL
  Filled 2020-07-01 (×2): qty 1

## 2020-07-01 MED ORDER — SODIUM BICARBONATE 8.4 % IV SOLN: 150.0000 meq | Freq: Once | INTRAVENOUS | Status: DC

## 2020-07-01 MED ORDER — SORBITOL 70 % SOLN
30.0000 mL | Freq: Every day | Status: DC | PRN
  Filled 2020-07-01: qty 30

## 2020-07-01 MED ORDER — HYDROCHLOROTHIAZIDE 25 MG PO TABS: 25.0000 mg | ORAL_TABLET | Freq: Every day | ORAL | Status: DC

## 2020-07-01 MED ORDER — ATORVASTATIN CALCIUM 20 MG PO TABS
40.0000 mg | ORAL_TABLET | Freq: Every day | ORAL | Status: DC
  Administered 2020-07-02 – 2020-07-04 (×3): 40 mg via ORAL
  Filled 2020-07-01 (×3): qty 2

## 2020-07-01 MED ORDER — MORPHINE SULFATE (PF) 2 MG/ML IV SOLN
2.0000 mg | INTRAVENOUS | Status: DC | PRN
Start: 2020-07-01 — End: 2020-07-06

## 2020-07-01 MED ORDER — MANNITOL 25 % IV SOLN: 25.0000 g | Freq: Once | INTRAVENOUS | Status: DC

## 2020-07-01 MED ORDER — CALCIUM GLUCONATE-NACL 1-0.675 GM/50ML-% IV SOLN
1.0000 g | Freq: Once | INTRAVENOUS | Status: AC
  Administered 2020-07-02: 1000 mg via INTRAVENOUS
  Filled 2020-07-01: qty 50

## 2020-07-01 MED ORDER — ALUM & MAG HYDROXIDE-SIMETH 200-200-20 MG/5ML PO SUSP: 15.0000 mL | ORAL | Status: DC | PRN

## 2020-07-01 MED ORDER — CHLORHEXIDINE GLUCONATE 0.12% ORAL RINSE (MEDLINE KIT)
15.0000 mL | Freq: Two times a day (BID) | OROMUCOSAL | Status: DC
  Administered 2020-07-01 – 2020-07-04 (×7): 15 mL via OROMUCOSAL

## 2020-07-01 MED ORDER — SUCCINYLCHOLINE CHLORIDE 20 MG/ML IJ SOLN
INTRAMUSCULAR | Status: DC | PRN
  Administered 2020-07-01: 160 mg via INTRAVENOUS

## 2020-07-01 MED ORDER — ACETAMINOPHEN 650 MG RE SUPP: 325.0000 mg | RECTAL | Status: DC | PRN

## 2020-07-01 MED ORDER — CHLORHEXIDINE GLUCONATE 0.12 % MT SOLN
OROMUCOSAL | Status: AC
  Administered 2020-07-01: 15 mL via OROMUCOSAL
  Filled 2020-07-01: qty 15

## 2020-07-01 MED ORDER — NOREPINEPHRINE 16 MG/250ML-% IV SOLN
0.0000 ug/min | INTRAVENOUS | Status: DC
  Administered 2020-07-01: 15 ug/min via INTRAVENOUS
  Administered 2020-07-02: 40 ug/min via INTRAVENOUS
  Filled 2020-07-01 (×3): qty 250

## 2020-07-01 MED ORDER — SODIUM BICARBONATE 8.4 % IV SOLN
150.0000 meq | Freq: Once | INTRAVENOUS | Status: AC
  Administered 2020-07-01: 150 meq via INTRAVENOUS

## 2020-07-01 MED ORDER — FENTANYL 2500MCG IN NS 250ML (10MCG/ML) PREMIX INFUSION
INTRAVENOUS | Status: AC
  Filled 2020-07-01: qty 250

## 2020-07-01 MED ORDER — STERILE WATER FOR INJECTION IV SOLN
INTRAVENOUS | Status: DC
  Filled 2020-07-01: qty 850
  Filled 2020-07-01 (×2): qty 150
  Filled 2020-07-01 (×3): qty 850

## 2020-07-01 MED ORDER — DOCUSATE SODIUM 100 MG PO CAPS
100.0000 mg | ORAL_CAPSULE | Freq: Every day | ORAL | Status: DC
  Filled 2020-07-01: qty 1

## 2020-07-01 MED ORDER — ACETAMINOPHEN 10 MG/ML IV SOLN
INTRAVENOUS | Status: AC
  Filled 2020-07-01: qty 100

## 2020-07-01 MED ORDER — SODIUM CHLORIDE 0.9 % IV SOLN: INTRAVENOUS | Status: DC

## 2020-07-01 MED ORDER — CEFAZOLIN SODIUM-DEXTROSE 2-4 GM/100ML-% IV SOLN
2.0000 g | Freq: Three times a day (TID) | INTRAVENOUS | Status: AC
  Administered 2020-07-01 – 2020-07-02 (×2): 2 g via INTRAVENOUS
  Filled 2020-07-01 (×4): qty 100

## 2020-07-01 MED ORDER — SODIUM CHLORIDE 0.9 % IV SOLN
INTRAVENOUS | Status: DC | PRN
  Administered 2020-07-01: 10 ug/min via INTRAVENOUS

## 2020-07-01 MED ORDER — MIDAZOLAM HCL 2 MG/2ML IJ SOLN
INTRAMUSCULAR | Status: AC
  Filled 2020-07-01: qty 2

## 2020-07-01 MED ORDER — TAMSULOSIN HCL 0.4 MG PO CAPS
0.4000 mg | ORAL_CAPSULE | Freq: Every day | ORAL | Status: DC
  Administered 2020-07-01: 0.4 mg via ORAL
  Filled 2020-07-01: qty 1

## 2020-07-01 MED ORDER — ORAL CARE MOUTH RINSE: 15.0000 mL | Freq: Once | OROMUCOSAL | Status: AC

## 2020-07-01 MED ORDER — PROPOFOL 500 MG/50ML IV EMUL
INTRAVENOUS | Status: AC
  Filled 2020-07-01: qty 50

## 2020-07-01 MED ORDER — FENTANYL CITRATE (PF) 100 MCG/2ML IJ SOLN
INTRAMUSCULAR | Status: AC
  Filled 2020-07-01: qty 2

## 2020-07-01 MED ORDER — PHENYLEPHRINE HCL (PRESSORS) 10 MG/ML IV SOLN
INTRAVENOUS | Status: DC | PRN
  Administered 2020-07-01: 100 ug via INTRAVENOUS
  Administered 2020-07-01: 200 ug via INTRAVENOUS
  Administered 2020-07-01: 150 ug via INTRAVENOUS
  Administered 2020-07-01: 200 ug via INTRAVENOUS
  Administered 2020-07-01: 100 ug via INTRAVENOUS
  Administered 2020-07-01: 200 ug via INTRAVENOUS
  Administered 2020-07-01 (×2): 100 ug via INTRAVENOUS
  Administered 2020-07-01: 40 ug via INTRAVENOUS
  Administered 2020-07-01: 200 ug via INTRAVENOUS
  Administered 2020-07-01 (×2): 100 ug via INTRAVENOUS
  Administered 2020-07-01: 150 ug via INTRAVENOUS
  Administered 2020-07-01: 40 ug via INTRAVENOUS
  Administered 2020-07-01 (×2): 100 ug via INTRAVENOUS
  Administered 2020-07-01: 150 ug via INTRAVENOUS
  Administered 2020-07-01: 200 ug via INTRAVENOUS

## 2020-07-01 MED ORDER — HEMOSTATIC AGENTS (NO CHARGE) OPTIME
TOPICAL | Status: DC | PRN
  Administered 2020-07-01: 1 via TOPICAL

## 2020-07-01 MED ORDER — FENTANYL 2500MCG IN NS 250ML (10MCG/ML) PREMIX INFUSION
0.0000 ug/h | INTRAVENOUS | Status: DC
  Administered 2020-07-02: 300 ug/h via INTRAVENOUS
  Administered 2020-07-02: 200 ug/h via INTRAVENOUS
  Administered 2020-07-02: 350 ug/h via INTRAVENOUS
  Administered 2020-07-03: 325 ug/h via INTRAVENOUS
  Filled 2020-07-01 (×4): qty 250

## 2020-07-01 MED ORDER — ALBUMIN HUMAN 5 % IV SOLN
INTRAVENOUS | Status: AC
  Filled 2020-07-01: qty 250

## 2020-07-01 MED ORDER — KETAMINE HCL 50 MG/5ML IJ SOSY
PREFILLED_SYRINGE | INTRAMUSCULAR | Status: AC
  Filled 2020-07-01: qty 5

## 2020-07-01 MED ORDER — SENNOSIDES-DOCUSATE SODIUM 8.6-50 MG PO TABS
1.0000 | ORAL_TABLET | Freq: Every evening | ORAL | Status: DC | PRN
Start: 2020-07-01 — End: 2020-07-14

## 2020-07-01 MED ORDER — LIDOCAINE HCL (CARDIAC) PF 100 MG/5ML IV SOSY
PREFILLED_SYRINGE | INTRAVENOUS | Status: DC | PRN
  Administered 2020-07-01: 100 mg via INTRAVENOUS

## 2020-07-01 MED ORDER — COLLAGENASE 250 UNIT/GM EX OINT
1.0000 "application " | TOPICAL_OINTMENT | Freq: Every day | CUTANEOUS | Status: DC
  Administered 2020-07-02 – 2020-07-13 (×13): 1 via TOPICAL
  Filled 2020-07-01 (×2): qty 30

## 2020-07-01 MED ORDER — VASOPRESSIN 20 UNITS/100 ML INFUSION FOR SHOCK
0.0000 [IU]/min | INTRAVENOUS | Status: DC
  Administered 2020-07-01: 0.02 [IU]/min via INTRAVENOUS
  Administered 2020-07-02 (×2): 0.05 [IU]/min via INTRAVENOUS
  Administered 2020-07-02: 0.04 [IU]/min via INTRAVENOUS
  Administered 2020-07-02: 0.05 [IU]/min via INTRAVENOUS
  Administered 2020-07-03 (×2): 0.04 [IU]/min via INTRAVENOUS
  Filled 2020-07-01 (×9): qty 100

## 2020-07-01 MED ORDER — EPHEDRINE 5 MG/ML INJ
INTRAVENOUS | Status: AC
  Filled 2020-07-01: qty 10

## 2020-07-01 MED ORDER — ATORVASTATIN CALCIUM 20 MG PO TABS: 20.0000 mg | ORAL_TABLET | Freq: Every day | ORAL | Status: DC

## 2020-07-01 MED ORDER — MANNITOL 25 % IV SOLN
INTRAVENOUS | Status: AC
  Filled 2020-07-01: qty 50

## 2020-07-01 MED ORDER — SODIUM CHLORIDE 0.9% IV SOLUTION: Freq: Once | INTRAVENOUS | Status: AC

## 2020-07-01 MED ORDER — SODIUM CHLORIDE 0.9% IV SOLUTION: Freq: Once | INTRAVENOUS | Status: DC

## 2020-07-01 MED ORDER — LIDOCAINE HCL (PF) 2 % IJ SOLN
INTRAMUSCULAR | Status: AC
  Filled 2020-07-01: qty 5

## 2020-07-01 MED ORDER — ROCURONIUM BROMIDE 100 MG/10ML IV SOLN
INTRAVENOUS | Status: DC | PRN
  Administered 2020-07-01: 10 mg via INTRAVENOUS
  Administered 2020-07-01: 20 mg via INTRAVENOUS
  Administered 2020-07-01: 30 mg via INTRAVENOUS
  Administered 2020-07-01: 20 mg via INTRAVENOUS
  Administered 2020-07-01: 45 mg via INTRAVENOUS
  Administered 2020-07-01: 5 mg via INTRAVENOUS
  Administered 2020-07-01: 10 mg via INTRAVENOUS
  Administered 2020-07-01: 20 mg via INTRAVENOUS

## 2020-07-01 MED ORDER — ESMOLOL HCL-SODIUM CHLORIDE 2000 MG/100ML IV SOLN
25.0000 ug/kg/min | INTRAVENOUS | Status: DC
  Filled 2020-07-01: qty 100

## 2020-07-01 MED ORDER — NITROGLYCERIN IN D5W 200-5 MCG/ML-% IV SOLN: 5.0000 ug/min | INTRAVENOUS | Status: DC

## 2020-07-01 MED ORDER — HEMOSTATIC AGENTS (NO CHARGE) OPTIME
TOPICAL | Status: DC | PRN
  Administered 2020-07-01: 3 via TOPICAL

## 2020-07-01 MED ORDER — MANNITOL 25 % IV SOLN
INTRAVENOUS | Status: DC | PRN
  Administered 2020-07-01 (×2): 12.5 g via INTRAVENOUS

## 2020-07-01 MED ORDER — METOPROLOL TARTRATE 5 MG/5ML IV SOLN: 2.0000 mg | INTRAVENOUS | Status: DC | PRN

## 2020-07-01 MED ORDER — ROCURONIUM BROMIDE 10 MG/ML (PF) SYRINGE
PREFILLED_SYRINGE | INTRAVENOUS | Status: AC
  Filled 2020-07-01: qty 10

## 2020-07-01 MED ORDER — VISTASEAL 10 ML SINGLE DOSE KIT
PACK | CUTANEOUS | Status: AC
  Filled 2020-07-01: qty 10

## 2020-07-01 MED ORDER — AMLODIPINE BESYLATE 10 MG PO TABS: 10.0000 mg | ORAL_TABLET | Freq: Every day | ORAL | Status: DC

## 2020-07-01 MED ORDER — ENOXAPARIN SODIUM 40 MG/0.4ML ~~LOC~~ SOLN: 40.0000 mg | SUBCUTANEOUS | Status: DC

## 2020-07-01 MED ORDER — VASOPRESSIN 20 UNIT/ML IV SOLN
INTRAVENOUS | Status: DC | PRN
  Administered 2020-07-01 (×4): 2 [IU] via INTRAVENOUS
  Administered 2020-07-01: 1 [IU] via INTRAVENOUS

## 2020-07-01 MED ORDER — PHENOL 1.4 % MT LIQD
1.0000 | OROMUCOSAL | Status: DC | PRN
  Filled 2020-07-01: qty 177

## 2020-07-01 MED ORDER — HEPARIN SODIUM (PORCINE) 1000 UNIT/ML IJ SOLN
INTRAMUSCULAR | Status: AC
  Filled 2020-07-01: qty 1

## 2020-07-01 MED ORDER — FAMOTIDINE IN NACL 20-0.9 MG/50ML-% IV SOLN
20.0000 mg | Freq: Two times a day (BID) | INTRAVENOUS | Status: DC
  Administered 2020-07-01 – 2020-07-04 (×6): 20 mg via INTRAVENOUS
  Filled 2020-07-01 (×9): qty 50

## 2020-07-01 MED ORDER — ASPIRIN EC 81 MG PO TBEC
81.0000 mg | DELAYED_RELEASE_TABLET | Freq: Every day | ORAL | Status: DC
  Administered 2020-07-11 – 2020-07-13 (×3): 81 mg via ORAL
  Filled 2020-07-01 (×7): qty 1

## 2020-07-01 MED ORDER — MIDAZOLAM HCL 2 MG/2ML IJ SOLN
INTRAMUSCULAR | Status: AC
  Administered 2020-07-01: 2 mg via INTRAVENOUS
  Filled 2020-07-01: qty 2

## 2020-07-01 MED ORDER — ORAL CARE MOUTH RINSE
15.0000 mL | OROMUCOSAL | Status: DC
  Administered 2020-07-01 – 2020-07-04 (×28): 15 mL via OROMUCOSAL

## 2020-07-01 MED ORDER — OXYCODONE-ACETAMINOPHEN 5-325 MG PO TABS
1.0000 | ORAL_TABLET | ORAL | Status: DC | PRN
  Administered 2020-07-04: 2 via ORAL
  Administered 2020-07-05 – 2020-07-06 (×3): 1 via ORAL
  Administered 2020-07-06 – 2020-07-08 (×2): 2 via ORAL
  Filled 2020-07-01: qty 2
  Filled 2020-07-01: qty 1
  Filled 2020-07-01: qty 2
  Filled 2020-07-01 (×2): qty 1
  Filled 2020-07-01: qty 2
  Filled 2020-07-01: qty 1

## 2020-07-01 MED ORDER — BENAZEPRIL HCL 20 MG PO TABS
40.0000 mg | ORAL_TABLET | Freq: Every day | ORAL | Status: DC
  Filled 2020-07-01 (×2): qty 2

## 2020-07-01 MED ORDER — EPINEPHRINE HCL 5 MG/250ML IV SOLN IN NS
0.5000 ug/min | INTRAVENOUS | Status: DC
  Administered 2020-07-01: 20 ug/min via INTRAVENOUS
  Filled 2020-07-01: qty 250

## 2020-07-01 MED ORDER — CEFAZOLIN SODIUM-DEXTROSE 2-4 GM/100ML-% IV SOLN
INTRAVENOUS | Status: AC
  Filled 2020-07-01: qty 100

## 2020-07-01 MED ORDER — ACETAMINOPHEN 325 MG PO TABS
325.0000 mg | ORAL_TABLET | ORAL | Status: DC | PRN
  Administered 2020-07-02 – 2020-07-04 (×5): 650 mg via ORAL
  Filled 2020-07-01 (×5): qty 2

## 2020-07-01 MED ORDER — FAMOTIDINE 20 MG PO TABS
ORAL_TABLET | ORAL | Status: AC
  Administered 2020-07-01: 20 mg via ORAL
  Filled 2020-07-01: qty 1

## 2020-07-01 MED ORDER — KETAMINE HCL 10 MG/ML IJ SOLN
INTRAMUSCULAR | Status: DC | PRN
  Administered 2020-07-01 (×2): 10 mg via INTRAVENOUS
  Administered 2020-07-01: 20 mg via INTRAVENOUS
  Administered 2020-07-01: 10 mg via INTRAVENOUS

## 2020-07-01 MED ORDER — HEPARIN SODIUM (PORCINE) 5000 UNIT/ML IJ SOLN
INTRAMUSCULAR | Status: AC
  Filled 2020-07-01: qty 1

## 2020-07-01 MED ORDER — ALBUMIN HUMAN 5 % IV SOLN: INTRAVENOUS | Status: DC | PRN

## 2020-07-01 MED ORDER — LACTATED RINGERS IV BOLUS
1000.0000 mL | Freq: Once | INTRAVENOUS | Status: AC
  Administered 2020-07-01: 1000 mL via INTRAVENOUS

## 2020-07-01 MED ORDER — MIDAZOLAM HCL 2 MG/2ML IJ SOLN
2.0000 mg | INTRAMUSCULAR | Status: DC | PRN
  Administered 2020-07-01 – 2020-07-02 (×2): 2 mg via INTRAVENOUS
  Filled 2020-07-01 (×5): qty 2

## 2020-07-01 MED ORDER — VISTASEAL 10 ML SINGLE DOSE KIT
PACK | CUTANEOUS | Status: DC | PRN
  Administered 2020-07-01: 20 mL via TOPICAL

## 2020-07-01 MED ORDER — PHENYLEPHRINE CONCENTRATED 100MG/250ML (0.4 MG/ML) INFUSION SIMPLE
0.0000 ug/min | INTRAVENOUS | Status: DC
  Administered 2020-07-01: 400 ug/min via INTRAVENOUS
  Administered 2020-07-02: 100 ug/min via INTRAVENOUS
  Administered 2020-07-02: 365 ug/min via INTRAVENOUS
  Administered 2020-07-02: 400 ug/min via INTRAVENOUS
  Administered 2020-07-03: 100 ug/min via INTRAVENOUS
  Filled 2020-07-01 (×7): qty 250

## 2020-07-01 MED ORDER — SODIUM BICARBONATE 4.2 % IV SOLN
150.0000 meq | Freq: Once | INTRAVENOUS | Status: DC
  Filled 2020-07-01: qty 300

## 2020-07-01 MED ORDER — HEPARIN SODIUM (PORCINE) 1000 UNIT/ML IJ SOLN
INTRAMUSCULAR | Status: DC | PRN
  Administered 2020-07-01 (×2): 2500 [IU] via INTRAVENOUS
  Administered 2020-07-01: 5000 [IU] via INTRAVENOUS
  Administered 2020-07-01: 2500 [IU] via INTRAVENOUS

## 2020-07-01 MED ORDER — HYDROCORTISONE NA SUCCINATE PF 100 MG IJ SOLR
50.0000 mg | Freq: Four times a day (QID) | INTRAMUSCULAR | Status: DC
  Administered 2020-07-01 – 2020-07-08 (×27): 50 mg via INTRAVENOUS
  Filled 2020-07-01 (×27): qty 2

## 2020-07-01 MED ORDER — CHLORHEXIDINE GLUCONATE 0.12 % MT SOLN: 15.0000 mL | Freq: Once | OROMUCOSAL | Status: AC

## 2020-07-01 MED ORDER — HYDRALAZINE HCL 20 MG/ML IJ SOLN
5.0000 mg | INTRAMUSCULAR | Status: AC | PRN
  Administered 2020-07-10 (×2): 5 mg via INTRAVENOUS
  Filled 2020-07-01 (×2): qty 1

## 2020-07-01 MED ORDER — SODIUM CHLORIDE 0.9 % IV SOLN: INTRAVENOUS | Status: DC | PRN

## 2020-07-01 MED ORDER — LABETALOL HCL 5 MG/ML IV SOLN: 10.0000 mg | INTRAVENOUS | Status: DC | PRN

## 2020-07-01 MED ORDER — DEXAMETHASONE SODIUM PHOSPHATE 10 MG/ML IJ SOLN
INTRAMUSCULAR | Status: AC
  Filled 2020-07-01: qty 1

## 2020-07-01 MED ORDER — PROPOFOL 10 MG/ML IV BOLUS
INTRAVENOUS | Status: DC | PRN
  Administered 2020-07-01: 120 mg via INTRAVENOUS

## 2020-07-01 MED ORDER — FAMOTIDINE 20 MG PO TABS: 20.0000 mg | ORAL_TABLET | Freq: Once | ORAL | Status: AC

## 2020-07-01 MED ORDER — GUAIFENESIN-DM 100-10 MG/5ML PO SYRP: 15.0000 mL | ORAL_SOLUTION | ORAL | Status: DC | PRN

## 2020-07-01 MED ORDER — SODIUM BICARBONATE 8.4 % IV SOLN
100.0000 meq | Freq: Once | INTRAVENOUS | Status: AC
  Administered 2020-07-01: 100 meq via INTRAVENOUS

## 2020-07-01 MED ORDER — EPHEDRINE SULFATE 50 MG/ML IJ SOLN
INTRAMUSCULAR | Status: DC | PRN
  Administered 2020-07-01 (×2): 5 mg via INTRAVENOUS

## 2020-07-01 MED ORDER — PROPOFOL 10 MG/ML IV BOLUS
INTRAVENOUS | Status: AC
  Filled 2020-07-01: qty 40

## 2020-07-01 MED ORDER — DIPHENHYDRAMINE HCL 50 MG/ML IJ SOLN
INTRAMUSCULAR | Status: DC | PRN
  Administered 2020-07-01: 50 mg via INTRAVENOUS

## 2020-07-01 MED ORDER — DEXAMETHASONE SODIUM PHOSPHATE 10 MG/ML IJ SOLN
INTRAMUSCULAR | Status: DC | PRN
  Administered 2020-07-01: 10 mg via INTRAVENOUS

## 2020-07-01 MED ORDER — SUCCINYLCHOLINE CHLORIDE 200 MG/10ML IV SOSY
PREFILLED_SYRINGE | INTRAVENOUS | Status: AC
  Filled 2020-07-01: qty 10

## 2020-07-01 MED ORDER — NOREPINEPHRINE 4 MG/250ML-% IV SOLN
2.0000 ug/min | INTRAVENOUS | Status: DC
  Administered 2020-07-01: 10 ug/min via INTRAVENOUS

## 2020-07-01 MED ORDER — ACETAMINOPHEN 10 MG/ML IV SOLN
INTRAVENOUS | Status: DC | PRN
  Administered 2020-07-01: 1000 mg via INTRAVENOUS

## 2020-07-01 MED ORDER — MIDAZOLAM HCL 2 MG/2ML IJ SOLN
INTRAMUSCULAR | Status: DC | PRN
  Administered 2020-07-01: 2 mg via INTRAVENOUS
  Administered 2020-07-01 (×3): .5 mg via INTRAVENOUS

## 2020-07-01 MED ORDER — SODIUM CHLORIDE 0.9 % IV SOLN: 500.0000 mL | Freq: Once | INTRAVENOUS | Status: DC | PRN

## 2020-07-01 MED ORDER — SODIUM CHLORIDE 0.9 % IV SOLN
INTRAVENOUS | Status: DC | PRN
  Administered 2020-07-01: 500 mL via INTRAMUSCULAR

## 2020-07-01 MED ORDER — POTASSIUM CHLORIDE CRYS ER 20 MEQ PO TBCR
20.0000 meq | EXTENDED_RELEASE_TABLET | Freq: Every day | ORAL | Status: AC | PRN
  Administered 2020-07-07: 40 meq via ORAL
  Filled 2020-07-01: qty 2

## 2020-07-01 MED ORDER — METOPROLOL TARTRATE 50 MG PO TABS
50.0000 mg | ORAL_TABLET | Freq: Two times a day (BID) | ORAL | Status: DC
  Filled 2020-07-01: qty 1

## 2020-07-01 MED ORDER — ONDANSETRON HCL 4 MG/2ML IJ SOLN
4.0000 mg | Freq: Four times a day (QID) | INTRAMUSCULAR | Status: DC | PRN
  Administered 2020-07-03 – 2020-07-06 (×4): 4 mg via INTRAVENOUS
  Filled 2020-07-01 (×3): qty 2

## 2020-07-01 MED ORDER — FUROSEMIDE 10 MG/ML IJ SOLN: 20.0000 mg | Freq: Once | INTRAMUSCULAR | Status: DC

## 2020-07-01 MED ORDER — ONDANSETRON HCL 4 MG/2ML IJ SOLN
INTRAMUSCULAR | Status: AC
  Filled 2020-07-01: qty 2

## 2020-07-01 MED ORDER — LACTATED RINGERS IV SOLN: INTRAVENOUS | Status: DC | PRN

## 2020-07-01 SURGICAL SUPPLY — 100 items
APPLIER CLIP 11 MED OPEN (CLIP)
APPLIER CLIP 13 LRG OPEN (CLIP)
APPLIER CLIP 9.375 SM OPEN (CLIP)
BAG COUNTER SPONGE EZ (MISCELLANEOUS) IMPLANT
BAG DECANTER FOR FLEXI CONT (MISCELLANEOUS) ×3 IMPLANT
BALLN LUTONIX AV 6X60X75 (BALLOONS) ×6
BALLOON LUTONIX AV 6X60X75 (BALLOONS) ×4 IMPLANT
BLADE CLIPPER SURG (BLADE) ×3 IMPLANT
BLADE SURG 15 STRL LF DISP TIS (BLADE) ×2 IMPLANT
BLADE SURG 15 STRL SS (BLADE) ×1
BLADE SURG SZ10 CARB STEEL (BLADE) ×3 IMPLANT
BLADE SURG SZ11 CARB STEEL (BLADE) ×3 IMPLANT
BOOT SUTURE AID YELLOW STND (SUTURE) ×6 IMPLANT
BRUSH SCRUB EZ  4% CHG (MISCELLANEOUS) ×1
BRUSH SCRUB EZ 4% CHG (MISCELLANEOUS) ×2 IMPLANT
CANISTER SUCT 3000ML PPV (MISCELLANEOUS) IMPLANT
CATH BEACON 5 .035 40 KMP TP (CATHETERS) ×4 IMPLANT
CATH BEACON 5 .035 65 KMP TIP (CATHETERS) ×3 IMPLANT
CATH BEACON 5 .038 40 KMP TP (CATHETERS) ×2
CATH CXI 4F 90 DAV (CATHETERS) ×3 IMPLANT
CHLORAPREP W/TINT 26ML (MISCELLANEOUS) ×6 IMPLANT
CLIP APPLIE 11 MED OPEN (CLIP) IMPLANT
CLIP APPLIE 13 LRG OPEN (CLIP) IMPLANT
CLIP APPLIE 9.375 SM OPEN (CLIP) IMPLANT
COVER BACK TABLE REUSABLE LG (DRAPES) ×3 IMPLANT
COVER PROBE FLX POLY STRL (MISCELLANEOUS) IMPLANT
COVER WAND RF STERILE (DRAPES) ×3 IMPLANT
DEVICE TORQUE (MISCELLANEOUS) ×6 IMPLANT
DRAPE C-ARM XRAY 36X54 (DRAPES) ×3 IMPLANT
DRAPE INCISE IOBAN 66X60 STRL (DRAPES) ×6 IMPLANT
DRAPE MAG INST 16X20 L/F (DRAPES) ×3 IMPLANT
DRESSING SURGICEL FIBRLLR 1X2 (HEMOSTASIS) ×8 IMPLANT
DRSG OPSITE POSTOP 4X12 (GAUZE/BANDAGES/DRESSINGS) ×3 IMPLANT
DRSG OPSITE POSTOP 4X8 (GAUZE/BANDAGES/DRESSINGS) ×6 IMPLANT
DRSG SURGICEL FIBRILLAR 1X2 (HEMOSTASIS) ×12
ELECT CAUTERY BLADE 6.4 (BLADE) ×6 IMPLANT
ELECT REM PT RETURN 9FT ADLT (ELECTROSURGICAL) ×6
ELECTRODE REM PT RTRN 9FT ADLT (ELECTROSURGICAL) ×4 IMPLANT
GAUZE SPONGE 4X4 12PLY STRL (GAUZE/BANDAGES/DRESSINGS) IMPLANT
GLIDEWIRE ADV .035X180CM (WIRE) ×6 IMPLANT
GLOVE SURG ENC MOIS LTX SZ7 (GLOVE) ×9 IMPLANT
GLOVE SURG UNDER LTX SZ7.5 (GLOVE) ×3 IMPLANT
GOWN STRL REUS W/ TWL LRG LVL3 (GOWN DISPOSABLE) ×6 IMPLANT
GOWN STRL REUS W/ TWL XL LVL3 (GOWN DISPOSABLE) ×4 IMPLANT
GOWN STRL REUS W/TWL LRG LVL3 (GOWN DISPOSABLE) ×3
GOWN STRL REUS W/TWL XL LVL3 (GOWN DISPOSABLE) ×2
GRAFT VASC BIF 14X7X40 (Graft) ×3 IMPLANT
GUIDEWIRE ADV .018X180CM (WIRE) ×3 IMPLANT
HANDLE YANKAUER SUCT BULB TIP (MISCELLANEOUS) IMPLANT
HEMOSTAT SURGICEL 2X14 (HEMOSTASIS) ×6 IMPLANT
IV CONNECTOR ONE LINK NDLESS (IV SETS) ×9 IMPLANT
KIT CV MULTILUMEN 7FR 20 (SET/KITS/TRAYS/PACK)
KIT CV MULTILUMEN 7FR 20 SUB (SET/KITS/TRAYS/PACK) IMPLANT
KIT ENCORE 26 ADVANTAGE (KITS) ×6 IMPLANT
KIT TURNOVER KIT A (KITS) ×3 IMPLANT
LABEL OR SOLS (LABEL) ×3 IMPLANT
LOOP RED MAXI  1X406MM (MISCELLANEOUS) ×7
LOOP VESSEL MAXI 1X406 RED (MISCELLANEOUS) ×14 IMPLANT
LOOP VESSEL MINI 0.8X406 BLUE (MISCELLANEOUS) ×8 IMPLANT
LOOPS BLUE MINI 0.8X406MM (MISCELLANEOUS) ×4
MANIFOLD NEPTUNE II (INSTRUMENTS) ×3 IMPLANT
NEEDLE FILTER BLUNT 18X 1/2SAF (NEEDLE) ×1
NEEDLE FILTER BLUNT 18X1 1/2 (NEEDLE) ×2 IMPLANT
NS IRRIG 500ML POUR BTL (IV SOLUTION) ×3 IMPLANT
PACK ANGIOGRAPHY (CUSTOM PROCEDURE TRAY) ×3 IMPLANT
PACK BASIN MAJOR ARMC (MISCELLANEOUS) ×3 IMPLANT
PACK UNIVERSAL (MISCELLANEOUS) ×3 IMPLANT
PENCIL ELECTRO HAND CTR (MISCELLANEOUS) ×3 IMPLANT
RETAINER VISCERA MED (MISCELLANEOUS) ×3 IMPLANT
SHEATH BRITE TIP 6FRX11 (SHEATH) ×3 IMPLANT
SHEATH BRITE TIP 8FRX11 (SHEATH) ×6 IMPLANT
SPONGE LAP 18X18 RF (DISPOSABLE) ×6 IMPLANT
SPONGE LAP 18X36 RFD (DISPOSABLE) ×3 IMPLANT
STAPLER SKIN PROX 35W (STAPLE) ×3 IMPLANT
SUT MNCRL 4-0 (SUTURE) ×2
SUT MNCRL 4-0 27XMFL (SUTURE) ×4
SUT PDS AB 1 TP1 96 (SUTURE) ×12 IMPLANT
SUT PROLENE 2 0 SH DA (SUTURE) ×6 IMPLANT
SUT PROLENE 3 0 SH DA (SUTURE) ×18 IMPLANT
SUT PROLENE 5 0 RB 1 DA (SUTURE) ×36 IMPLANT
SUT PROLENE 6 0 BV (SUTURE) ×18 IMPLANT
SUT PROLENE 7 0 BV 1 (SUTURE) ×6 IMPLANT
SUT SILK 2 0 (SUTURE) ×2
SUT SILK 2 0 SH (SUTURE) ×3 IMPLANT
SUT SILK 2-0 18XBRD TIE 12 (SUTURE) ×4 IMPLANT
SUT SILK 3 0 (SUTURE) ×1
SUT SILK 3-0 18XBRD TIE 12 (SUTURE) ×2 IMPLANT
SUT SILK 4 0 (SUTURE) ×1
SUT SILK 4-0 18XBRD TIE 12 (SUTURE) ×2 IMPLANT
SUT VIC AB 0 CT1 36 (SUTURE) ×9 IMPLANT
SUT VIC AB 2-0 CT1 27 (SUTURE) ×4
SUT VIC AB 2-0 CT1 TAPERPNT 27 (SUTURE) ×8 IMPLANT
SUT VICRYL+ 3-0 36IN CT-1 (SUTURE) ×12 IMPLANT
SUTURE MNCRL 4-0 27XMF (SUTURE) ×4 IMPLANT
SYR 20ML LL LF (SYRINGE) ×3 IMPLANT
SYR 3ML LL SCALE MARK (SYRINGE) ×3 IMPLANT
SYR BULB IRRIG 60ML STRL (SYRINGE) ×3 IMPLANT
TAPE UMBIL 1/8X18 RADIOPA (MISCELLANEOUS) ×3 IMPLANT
TOWEL OR 17X26 4PK STRL BLUE (TOWEL DISPOSABLE) ×3 IMPLANT
TRAY FOLEY MTR SLVR 16FR STAT (SET/KITS/TRAYS/PACK) ×3 IMPLANT

## 2020-07-01 NOTE — Consult Note (Signed)
CRITICAL CARE PROGRESS NOTE    Name: Ivan Larsen MRN: [id number] DOB: [birth year]     LOS: 0   SUBJECTIVE FINDINGS & SIGNIFICANT EVENTS    Patient description:   65 yo with hx of severe peripheral vascular disease, dm, dyslipidemia came in for vascular surgery procedure.  After surgery patient was in circulatory shock and was emergently brought to MICU due to medical instability. Intraop he lost apx 2L blood with 1/2 returned via blood saver, received IVF fluids.  He required vasopressor support perioperatively.   Lines/tubes : Airway 7.5 mm (Active)  Secured at (cm) 23 cm 06/25/2020 1630  Measured From Lips 07/12/2020 1630  Secured Location Right 07/05/2020 1630  Secured By [employer] 07/11/2020 1630  Prone position No 06/17/2020 1630  Site Condition Cool 07/12/2020 1630     Arterial Line 06/19/2020 Radial (Active)     NG/OG Tube Nasogastric 16 Fr. Left nare Confirmed by Surgical Manipulation Measured external length of tube (Active)     Urethral Catheter Godwin Non-latex 16 Fr. (Active)    Microbiology/Sepsis markers: Results for orders placed or performed during the hospital encounter of 06/29/20  SARS CORONAVIRUS 2 (TAT 6-24 HRS) Nasopharyngeal Nasopharyngeal Swab     Status: None   Collection Time: 06/29/20 11:36 AM   Specimen: Nasopharyngeal Swab  Result Value Ref Range Status   SARS Coronavirus 2 NEGATIVE NEGATIVE Final    Comment: (NOTE) SARS-CoV-2 target nucleic acids are NOT DETECTED.  The SARS-CoV-2 RNA is generally detectable in upper and lower respiratory specimens during the acute phase of infection. Negative results do not preclude SARS-CoV-2 infection, do not rule out co-infections with other pathogens, and should not be used as the sole basis for treatment or other patient  management decisions. Negative results must be combined with clinical observations, patient history, and epidemiological information. The expected result is Negative.  Fact Sheet for Patients: SugarRoll.be  Fact Sheet for Healthcare Providers: https://www.woods-mathews.com/  This test is not yet approved or cleared by the [place] FDA and  has been authorized for detection and/or diagnosis of SARS-CoV-2 by FDA under an Emergency Use Authorization (EUA). This EUA will remain  in effect (meaning this test can be used) for the duration of the COVID-19 declaration under Se ction 564(b)(1) of the Act, 21 U.S.C. section 360bbb-3(b)(1), unless the authorization is terminated or revoked sooner.  Performed at [facility], [place] [address], [place], [place] [address]     Anti-infectives:  Anti-infectives (From admission, onward)   Start     Dose/Rate Route Frequency Ordered Stop   07/03/2020 2200  ceFAZolin (ANCEF) IVPB 2g/100 mL premix        2 g 200 mL/hr over 30 Minutes Intravenous Every 8 hours 07/06/2020 1630 07/02/20 1359   06/18/2020 0702  ceFAZolin (ANCEF) 2-4 GM/100ML-% IVPB       Note to Pharmacy: Register, Karen   : cabinet override      06/19/2020 0702 07/11/2020 0916   07/06/2020 0600  ceFAZolin (ANCEF) IVPB 2g/100 mL premix        2 g 200 mL/hr over 30 Minutes Intravenous On call to O.R. 06/30/20 2354 07/11/2020 1340       PAST MEDICAL HISTORY   Past Medical History:  Diagnosis Date  . CAD (coronary artery disease)   . Diabetes mellitus without complication ([place])   . Elevated liver enzymes   . History of kidney stones   . Hyperlipidemia   . MI (myocardial infarction) ([place]) 07/01/12  .  PAD (peripheral artery disease) ([place])      SURGICAL HISTORY   Past Surgical History:  Procedure Laterality Date  . CORONARY STENT PLACEMENT    . EYE SURGERY    . LOWER EXTREMITY ANGIOGRAPHY Left 06/25/2020   Procedure: LOWER EXTREMITY  ANGIOGRAPHY;  Surgeon: Algernon Huxley, MD;  Location: [facility];  Service: Cardiovascular;  Laterality: Left;     FAMILY HISTORY   Family History  Problem Relation Age of Onset  . Diabetes Mother   . Heart attack Father      SOCIAL HISTORY   Social History   Tobacco Use  . Smoking status: Former Smoker    Types: Cigarettes    Quit date: 01/15/2004    Years since quitting: 16.4  . Smokeless tobacco: Never Used  Vaping Use  . Vaping Use: Never used  Substance Use Topics  . Alcohol use: Yes  . Drug use: No     MEDICATIONS   Current Medication:  Current Facility-Administered Medications:  .  0.9 %  sodium chloride infusion, , Intravenous, Continuous, Dew, Erskine Squibb, MD, Last Rate: 10 mL/hr at 06/30/2020 0809, New Bag at 06/18/2020 1435 .  0.9 %  sodium chloride infusion, , Intravenous, Continuous, Dew, Jason S, MD .  0.9 %  sodium chloride infusion, 500 mL, Intravenous, Once PRN, Lucky Cowboy, Erskine Squibb, MD .  acetaminophen (TYLENOL) tablet 325-650 mg, 325-650 mg, Oral, Q4H PRN **OR** acetaminophen (TYLENOL) suppository 325-650 mg, 325-650 mg, Rectal, Q4H PRN, Lucky Cowboy, Erskine Squibb, MD .  alum & mag hydroxide-simeth (MAALOX/MYLANTA) 200-200-20 MG/5ML suspension 15-30 mL, 15-30 mL, Oral, Q2H PRN, Algernon Huxley, MD .  Derrill Memo ON 07/02/2020] amLODipine (NORVASC) tablet 10 mg, 10 mg, Oral, Daily, Dew, Erskine Squibb, MD .  Derrill Memo ON 07/02/2020] aspirin EC tablet 81 mg, 81 mg, Oral, Q0600, Algernon Huxley, MD .  atorvastatin (LIPITOR) tablet 40 mg, 40 mg, Oral, Daily, Dew, Erskine Squibb, MD .  benazepril (LOTENSIN) tablet 40 mg, 40 mg, Oral, Daily, Dew, Erskine Squibb, MD .  ceFAZolin (ANCEF) IVPB 2g/100 mL premix, 2 g, Intravenous, Q8H, Dew, Erskine Squibb, MD .  chlorhexidine gluconate (MEDLINE KIT) (PERIDEX) 0.12 % solution 15 mL, 15 mL, Mouth Rinse, BID, Dmari Schubring, MD .  6 CHG cloth bath night before surgery, , , Once **AND** 6 CHG cloth bath AM of surgery, , , Once **AND** [COMPLETED] Chlorhexidine Gluconate Cloth 2 %  PADS 6 each, 6 each, Topical, Once, 6 each at 06/18/2020 0730 **AND** Chlorhexidine Gluconate Cloth 2 % PADS 6 each, 6 each, Topical, Once, Dew, Erskine Squibb, MD .  collagenase (SANTYL) ointment 1 application, 1 application, Topical, Daily, Dew, Erskine Squibb, MD .  Derrill Memo ON 07/02/2020] docusate sodium (COLACE) capsule 100 mg, 100 mg, Oral, Daily, Dew, Erskine Squibb, MD .  Derrill Memo ON 07/02/2020] enoxaparin (LOVENOX) injection 40 mg, 40 mg, Subcutaneous, Q24H, Dew, Erskine Squibb, MD .  esmolol (BREVIBLOC) 2000 mg / 100 mL (20 mg/mL) infusion, 25-300 mcg/kg/min, Intravenous, Titrated, Dew, Erskine Squibb, MD .  famotidine (PEPCID) IVPB 20 mg premix, 20 mg, Intravenous, Q12H, Dew, Jason S, MD .  fentaNYL 10 mcg/ml infusion, , , ,  .  fentaNYL 2598mg in NS 2553m(1016mml) infusion-PREMIX, 0-400 mcg/hr, Intravenous, Continuous, Meilyn Heindl, MD .  gabapentin (NEURONTIN) capsule 300 mg, 300 mg, Oral, TID, Dew, JasErskine SquibbD .  guaiFENesin-dextromethorphan (ROBITUSSIN DM) 100-10 MG/5ML syrup 15 mL, 15 mL, Oral, Q4H PRN, DewLucky CowboyasErskine SquibbD .  hydrALAZINE (APRESOLINE) injection 5 mg, 5 mg,  Intravenous, Q20 Min PRN, Algernon Huxley, MD .  Derrill Memo ON 07/02/2020] hydrochlorothiazide (HYDRODIURIL) tablet 25 mg, 25 mg, Oral, Daily, Dew, Erskine Squibb, MD .  HYDROmorphone (DILAUDID) injection 1 mg, 1 mg, Intravenous, Once PRN, Lucky Cowboy, Erskine Squibb, MD .  labetalol (NORMODYNE) injection 10 mg, 10 mg, Intravenous, Q10 min PRN, Lucky Cowboy, Erskine Squibb, MD .  magnesium sulfate IVPB 2 g 50 mL, 2 g, Intravenous, Daily PRN, Algernon Huxley, MD .  MEDLINE mouth rinse, 15 mL, Mouth Rinse, 10 times per day, Ottie Glazier, MD .  metoprolol tartrate (LOPRESSOR) injection 2-5 mg, 2-5 mg, Intravenous, Q2H PRN, Lucky Cowboy, Erskine Squibb, MD .  metoprolol tartrate (LOPRESSOR) tablet 50 mg, 50 mg, Oral, BID, Dew, Erskine Squibb, MD .  morphine 2 MG/ML injection 2-5 mg, 2-5 mg, Intravenous, Q1H PRN, Algernon Huxley, MD .  nitroGLYCERIN 50 mg in dextrose 5 % 250 mL (0.2 mg/mL) infusion, 5-250 mcg/min, Intravenous,  Titrated, Dew, Erskine Squibb, MD .  norepinephrine (LEVOPHED) 53m in 2591mpremix infusion, 2-10 mcg/min, Intravenous, Titrated, Eugenie Harewood, MD .  ondansetron (ZOFRAN) injection 4 mg, 4 mg, Intravenous, Q6H PRN, DeLucky CowboyJaErskine SquibbMD .  ondansetron (ZOFRAN) injection 4 mg, 4 mg, Intravenous, Q6H PRN, DeAlgernon HuxleyMD .  oxyCODONE-acetaminophen (PERCOCET/ROXICET) 5-325 MG per tablet 1-2 tablet, 1-2 tablet, Oral, Q4H PRN, DeLucky CowboyJaErskine SquibbMD .  phenol (CHLORASEPTIC) mouth spray 1 spray, 1 spray, Mouth/Throat, PRN, DeLucky CowboyJaErskine SquibbMD .  potassium chloride SA (KLOR-CON) CR tablet 20-40 mEq, 20-40 mEq, Oral, Daily PRN, DeAlgernon HuxleyMD .  senna-docusate (Senokot-S) tablet 1 tablet, 1 tablet, Oral, QHS PRN, DeAlgernon HuxleyMD .  sorbitol 70 % solution 30 mL, 30 mL, Oral, Daily PRN, DeAlgernon HuxleyMD .  tamsulosin (FLOMAX) capsule 0.4 mg, 0.4 mg, Oral, QHS, Dew, JaErskine SquibbMD    ALLERGIES   Iodinated diagnostic agents, Iodine, Shellfish allergy, and Shellfish-derived products    REVIEW OF SYSTEMS    Unable to obtain due to mechanical ventilation and sedation  PHYSICAL EXAMINATION   Vital Signs: Temp:  [94.9 F (34.9 C)-98.1 F (36.7 C)] 94.9 F (34.9 C) (02/16 1645) Pulse Rate:  [77-109] 109 (02/16 1645) Resp:  [16-18] 18 (02/16 1645) BP: (85-163)/(59-81) 85/69 (02/16 1645) SpO2:  [96 %-100 %] 100 % (02/16 1645) Arterial Line BP: (87-114)/(51-56) 87/51 (02/16 1645) FiO2 (%):  [50 %] 50 % (02/16 1630) Weight:  [63.5 kg-66.2 kg] 66.2 kg (02/16 1630)  GENERAL:Age appropriate HEAD: Normocephalic, atraumatic.  EYES: Pupils equal, round, reactive to light.  No scleral icterus.  MOUTH: Moist mucosal membrane. NECK: Supple. No thyromegaly. No nodules. No JVD.  PULMONARY: MV sounds bilaterally  CARDIOVASCULAR: S1 and S2. Regular rate and rhythm. No murmurs, rubs, or gallops.  GASTROINTESTINAL: Soft, nontender, non-distended. No masses. Positive bowel sounds. No hepatosplenomegaly.  MUSCULOSKELETAL: No  swelling, clubbing, or edema.  NEUROLOGIC: GCS4T SKIN:intact,warm,dry   PERTINENT DATA     Infusions: . sodium chloride 10 mL/hr at 06/30/2020 0809  . sodium chloride    . sodium chloride    .  ceFAZolin (ANCEF) IV    . esmolol    . famotidine (PEPCID) IV    . fentaNYL    . fentaNYL infusion INTRAVENOUS    . magnesium sulfate bolus IVPB    . nitroGLYCERIN    . norepinephrine (LEVOPHED) Adult infusion     Scheduled Medications: . [START ON 07/02/2020] amLODipine  10 mg Oral Daily  . [START ON 07/02/2020] aspirin EC  81 mg Oral Q0600  . atorvastatin  40 mg Oral Daily  . benazepril  40 mg Oral Daily  . chlorhexidine gluconate (MEDLINE KIT)  15 mL Mouth Rinse BID  . Chlorhexidine Gluconate Cloth  6 each Topical Once  . collagenase  1 application Topical Daily  . [START ON 07/02/2020] docusate sodium  100 mg Oral Daily  . [START ON 07/02/2020] enoxaparin (LOVENOX) injection  40 mg Subcutaneous Q24H  . gabapentin  300 mg Oral TID  . [START ON 07/02/2020] hydrochlorothiazide  25 mg Oral Daily  . mouth rinse  15 mL Mouth Rinse 10 times per day  . metoprolol tartrate  50 mg Oral BID  . tamsulosin  0.4 mg Oral QHS   PRN Medications: sodium chloride, acetaminophen **OR** acetaminophen, alum & mag hydroxide-simeth, guaiFENesin-dextromethorphan, hydrALAZINE, HYDROmorphone (DILAUDID) injection, labetalol, magnesium sulfate bolus IVPB, metoprolol tartrate, morphine injection, ondansetron (ZOFRAN) IV, ondansetron, oxyCODONE-acetaminophen, phenol, potassium chloride, senna-docusate, sorbitol Hemodynamic parameters:   Intake/Output: No intake/output data recorded.  Ventilator  Settings: Vent Mode: PRVC FiO2 (%):  [50 %] 50 % Set Rate:  [18 bmp] 18 bmp Vt Set:  [500 mL] 500 mL PEEP:  [5 cmH20] 5 cmH20    LAB RESULTS:  Basic Metabolic Panel: Recent Labs  Lab 06/25/20 1034 06/26/2020 0741  NA  --  140  K  --  3.8  CL  --  100  CO2  --  26  GLUCOSE  --  176*  BUN 24* 21  CREATININE  0.90 0.91  CALCIUM  --  9.7   Liver Function Tests: No results for input(s): AST, ALT, ALKPHOS, BILITOT, PROT, ALBUMIN in the last 168 hours. No results for input(s): LIPASE, AMYLASE in the last 168 hours. No results for input(s): AMMONIA in the last 168 hours. CBC: Recent Labs  Lab 06/21/2020 0741 07/10/2020 1507  WBC 13.3* 21.9*  NEUTROABS 9.5*  --   HGB 13.7 8.3*  HCT 41.1 24.7*  MCV 85.6 87.6  PLT 290 216   Cardiac Enzymes: No results for input(s): CKTOTAL, CKMB, CKMBINDEX, TROPONINI in the last 168 hours. BNP: Invalid input(s): POCBNP CBG: Recent Labs  Lab 06/25/20 1030 07/05/2020 0756 06/16/2020 1619  GLUCAP 146* 170* 207*       IMAGING RESULTS:  Imaging: DG C-Arm 1-60 Min-No Report  Result Date: 06/23/2020 Fluoroscopy was utilized by the requesting physician.  No radiographic interpretation.   _0 @ DG C-Arm 1-60 Min-No Report  Result Date: 07/10/2020 Fluoroscopy was utilized by the requesting physician.  No radiographic interpretation.     ASSESSMENT AND PLAN    -Multidisciplinary rounds held today  Acute Circulatory shock   Presumably due to some blood loss and sedation, will continue IVF, repeat CXR.    - dont think this is transfusion associeated lung injury since patient received his own blood    -A-line monitoring       -currently on levophed low dose 15mg, if able to wean off can hold off on central line acess      - blood cultures    Severe peripheral vascular diseae S/p surgery as below 1.    Aortobifemoral bypass grafting with 14 x 7 bifurcated dacryon graft 2. Right renal endarterectomy 3. Left renal endarterectomy 4.   Right common femoral endarterectomy 5.   Right superficial femoral endarterectomy 6.   Left common femoral endarterectomy 7.   Left superficial femoral endarterectomy 8.   Catheter placement into the aorta by Dr. SDelana Meyerright groin approach. 9.   Catheter placement  into the aorta and subsequently catheter placement  into the popliteal by Dr. Lucky Cowboy left groin approach    ID -continue IV abx as prescibed -follow up cultures  GI/Nutrition GI PROPHYLAXIS as indicated DIET-->TF's as tolerated Constipation protocol as indicated  ENDO - ICU hypoglycemic\Hyperglycemia protocol -check FSBS per protocol   ELECTROLYTES -follow labs as needed -replace as needed -pharmacy consultation   DVT/GI PRX ordered -SCDs  TRANSFUSIONS AS NEEDED MONITOR FSBS ASSESS the need for LABS as needed   Critical care provider statement:    Critical care time (minutes):  109   Critical care time was exclusive of:  Separately billable procedures and treating other patients   Critical care was necessary to treat or prevent imminent or life-threatening deterioration of the following conditions:  circulatory shock, severe pad.   Critical care was time spent personally by me on the following activities:  Development of treatment plan with patient or surrogate, discussions with consultants, evaluation of patient's response to treatment, examination of patient, obtaining history from patient or surrogate, ordering and performing treatments and interventions, ordering and review of laboratory studies and re-evaluation of patient's condition.  I assumed direction of critical care for this patient from another provider in my specialty: no    This document was prepared using Dragon voice recognition software and may include unintentional dictation errors.    Ottie Glazier, M.D.  Division of [facility]

## 2020-07-01 NOTE — Anesthesia Procedure Notes (Signed)
Procedure Name: Intubation Date/Time: 06/28/2020 8:47 AM Performed by: Aline Brochure, CRNA Pre-anesthesia Checklist: Patient identified, Patient being monitored, Timeout performed, Emergency Drugs available and Suction available Patient Re-evaluated:Patient Re-evaluated prior to induction Oxygen Delivery Method: Circle system utilized Preoxygenation: Pre-oxygenation with 100% oxygen Induction Type: IV induction and Cricoid Pressure applied Ventilation: Mask ventilation without difficulty and Oral airway inserted - appropriate to patient size Laryngoscope Size: Mac and 4 Grade View: Grade I Tube type: Oral Tube size: 7.5 mm Number of attempts: 1 Airway Equipment and Method: Stylet Placement Confirmation: ETT inserted through vocal cords under direct vision,  positive ETCO2 and breath sounds checked- equal and bilateral Secured at: 21 cm Tube secured with: Tape Dental Injury: Teeth and Oropharynx as per pre-operative assessment  Comments: Performed by Heide Scales, SRNA under direct supervision by Dr. Addison Bailey and Aline Brochure, CRNA

## 2020-07-01 NOTE — Interval H&P Note (Signed)
History and Physical Interval Note:  July 17, 2020 8:01 AM  Ivan Larsen  has presented today for surgery, with the diagnosis of PAD WITH ULCERATION.  The various methods of treatment have been discussed with the patient and family. After consideration of risks, benefits and other options for treatment, the patient has consented to  Procedure(s): AORTA BIFEMORAL BYPASS GRAFT (VERSUS BILATERAL FEMORAL ENDARTERECTOMIES) (N/A) APPLICATION OF CELL SAVER (N/A) INSERTION OF ILIAC STENT ( LEFT SFA ALSO ) (Bilateral) as a surgical intervention.  The patient's history has been reviewed, patient examined, no change in status, stable for surgery.  I have reviewed the patient's chart and labs.  Questions were answered to the patient's satisfaction.     Festus Barren

## 2020-07-01 NOTE — Anesthesia Preprocedure Evaluation (Addendum)
Anesthesia Evaluation  Patient identified by MRN, date of birth, ID band Patient awake    Reviewed: Allergy & Precautions, H&P , NPO status , Patient's Chart, lab work & pertinent test results  Airway Mallampati: III       Dental  (+) Missing, Chipped, Poor Dentition   Pulmonary neg pulmonary ROS, former smoker,    Pulmonary exam normal breath sounds clear to auscultation       Cardiovascular hypertension, + CAD, + Past MI and + Peripheral Vascular Disease  Normal cardiovascular exam Rhythm:Regular Rate:Normal     Neuro/Psych negative neurological ROS  negative psych ROS   GI/Hepatic Neg liver ROS, GERD  ,  Endo/Other  negative endocrine ROSdiabetes  Renal/GU negative Renal ROS  negative genitourinary   Musculoskeletal negative musculoskeletal ROS (+)   Abdominal   Peds negative pediatric ROS (+)  Hematology negative hematology ROS (+)   Anesthesia Other Findings Past Medical History: No date: CAD (coronary artery disease) No date: Diabetes mellitus without complication ([facility]) No date: Elevated liver enzymes No date: History of kidney stones No date: Hyperlipidemia 07/01/12: MI (myocardial infarction) ([facility]) No date: PAD (peripheral artery disease) ([facility])   Reproductive/Obstetrics negative OB ROS                            Anesthesia Physical Anesthesia Plan  ASA: III  Anesthesia Plan: General   Post-op Pain Management:    Induction: Intravenous  PONV Risk Score and Plan: 2 and Ondansetron, Dexamethasone and Treatment may vary due to age or medical condition  Airway Management Planned: Oral ETT  Additional Equipment: Arterial line  Intra-op Plan:   Post-operative Plan: Extubation in OR  Informed Consent: I have reviewed the patients History and Physical, chart, labs and discussed the procedure including the risks, benefits and alternatives for the proposed anesthesia with  the patient or authorized representative who has indicated his/her understanding and acceptance.     Dental advisory given  Plan Discussed with: CRNA, Anesthesiologist and Surgeon  Anesthesia Plan Comments:         Anesthesia Quick Evaluation

## 2020-07-01 NOTE — Op Note (Signed)
OPERATIVE NOTE   PROCEDURE:   1.  Aortobifemoral bypass with 14 mm diameter proximal 7 mm diameter distal bifurcated Dacron graft 2.   Left common femoral and superficial femoral artery endarterectomies 3.   Right common femoral and superficial femoral artery endarterectomies 4.   Catheter placement to the aorta from bilateral femoral approaches and aortogram and iliofemoral arteriogram 5.   Catheter placement of the left popliteal artery from left femoral approach and left lower extremity angiogram 6.   Proximal infrarenal aorta endarterectomy 7.   Extensive bilateral renal endarterectomies   PRE-OPERATIVE DIAGNOSIS: 1.Atherosclerotic occlusive disease bilateral lower extremities with rest pain on the right and ulceration on the left 2. Aortoiliac atherosclerosis 3. Bilateral renal artery stenosis  POST-OPERATIVE DIAGNOSIS: Same  SURGEON: Festus Barren, MD  CO-surgeon:  Levora Dredge, MD  ANESTHESIA:  general  ESTIMATED BLOOD LOSS: 2000 cc  FINDING(S): 1.  Aortoiliac disease unable to be crossed for endovascular therapy, significant plaque in bilateral common femoral, profunda femoris, and superficial femoral arteries 2.  Left SFA occlusion 3.  Proximal aortic plaque which was very extensive 4.  Bilateral renal artery stenosis    SPECIMEN(S):  Bilateral common femoral, profunda femoris, and superficial femoral artery plaque. Aortic plaque Bilateral renal artery plaque  INDICATIONS:    Patient presents with rest pain on the right and ulceration on the left.  Aortobifemoral bypass versus bilateral femoral endarterectomies with endovascular aortoiliac intervention is planned for revascularization for limb salvage.  The risks and benefits as well as alternative therapies including intervention were reviewed in detail all questions were answered the patient agrees to proceed with surgery. Co-surgeons are used to expedite the procedure and reduce operative time as bilateral work needs  to be done.  DESCRIPTION: After obtaining full informed written consent, the patient was brought back to the operating room and placed supine upon the operating table.  The patient received IV antibiotics prior to induction.  After obtaining adequate anesthesia, the patient was prepped and draped in the standard fashion appropriate time out is called.  Initially a central line is placed and this will be dictated as a separate note.  With myself working on the left and Dr. Gilda Crease working on the right we began by dissecting out the femoral arteries on each side. Vertical incisions were created overlying both femoral arteries. The common femoral artery proximally, and superficial femoral artery, and primary profunda femoris artery branches were encircled with vessel loops and prepared for control. Both femoral arteries were found to have significant plaque from the common femoral artery into the profunda and superficial femoral arteries very extensive disease extending well down to the superficial femoral arteries.  The disease also went up into the iliac arteries and was quite severe.   5000 units of heparin was given and allowed circulate for 5 minutes.   Attention is then turned to the left femoral artery.  An arteriotomy is made with 11 blade and extended with Potts scissors in the common femoral artery and carried down onto the first 4 cm of the superficial femoral artery. An endarterectomy was then performed. The [facility] was used to create a plane.  This was extremely bulky plaque which created a complete occlusion in the common femoral artery and the proximal superficial femoral artery.  The proximal endpoint was cut flush with tenotomy scissors and then large chunks were pulled out with a hemostat. This was in the occiput common femoral artery. An eversion endarterectomy was then performed for the first 2 cm  of the profunda femoris artery. Good backbleeding was then seen. The distal  endpoint of the superficial femoral endarterectomy was created with gentle traction and several large chunks of plaque were created well beyond the arteriotomy well into the left SFA and the distal endpoint was relatively good although a known occlusion was below this.    The right femoral artery is then addressed. Arteriotomy is made in the common femoral artery and extended down into the first 3 to 4 cm of the superficial femoral artery. Similarly, an endarterectomy was performed with the [facility]. The proximal endpoint was cut flush with tenotomy scissors large piece of plaque were pulled out with a hemostat in the proximal common femoral artery.  The origin of the profunda femoris artery was addressed and treated with an eversion endarterectomy and this was performed with a hemostat and gentle traction. The arteriotomy was carried down onto the superficial femoral artery and the endarterectomy was continued to this point. The distal endpoint was then created with gentle traction with a hemostat well beyond the arteriotomy for a very extensive endarterectomy in the proximal right superficial femoral artery.   We then both put 8 French sheaths up from the arteriotomy into the iliac arteries.  Attempts were made to cross the occluded iliac arteries and distal aorta with the Kumpe catheter and advantage wires.  Passes were made and we could get into the aorta but can never get back into the true lumen.  Attempts at balloon angioplasty in the common iliac arteries and distal aorta with 6 mm balloons were used to try to create a channel to allow [place] to get intraluminal.  This was done on both sides without success.  Imaging was performed in the subintimal plane in the aorta but we were never able to get into the true lumen.  At this point, it was clear we would need to perform the aortobifemoral bypass. I then turned my attention to the left SFA.  A long segment occlusion down to Hunter's canal was seen  on imaging of the left femoral artery with a sheath in the profunda femoris artery.  I then put an 8 French sheath down into the left SFA and attempted to cross the occlusion with a Kumpe catheter and advantage wire.  This was an extremely calcific occlusion and I could get down into the popliteal artery but it was never able to get into the true lumen in the popliteal artery.  Imaging did show good below-knee popliteal artery with three-vessel runoff distally, and after it was clear we were not going to get across this today we could come back from a pedal approach at a later date if need be.  The midline incision is then created and the dissection carried down to expose the fascia. The peritoneal cavity is entered without difficulty just below the xiphoid process. As the incision is extended to the supraumbilical area. The omentum was then reflected superiorly and the small intestine is swept into the right gutter. Small intestine was then packed away with a large laparotomy pad which is moistened with saline.  The Omni-Tract retractor was then used to help facilitate our exposure.  The retroperitoneum is then opened along the midline overlying the pulse which is palpable at the level of the duodenum. The inferior mesenteric vein is identified and skeletonized so that he is mobilized and able to be retracted superiorly and left laterally out of the field. The renal vein is then identified. At this point the  Omni-Tract is positioned to allow full exposure of the aorta.  The perirenal and proximal infrarenal aorta were extremely diseased and the dissection had to be carried up into the suprarenal aorta to clamp just below the SMA.  This required ligation of the left gonadal vein and the left adrenal vein to completely mobilize the left renal vein and avoid injury.  Both renal arteries were dissected out and also found to be extensively diseased and this was noted on the CT scan as well.  We got out beyond the  disease and encircled this with Vesseloops bilaterally. The retroperitoneal dissection is then carried inferiorly past the palpable aortic bifurcation.The dissection is then carried down to the aortic bifurcation and right common iliac artery .  The distal clamp be placed on the distal aorta.  An 14 mm x 7 mm  Dacron bifurcated graft is then selected and rehydrated on the back table. With myself working on the left side and Dr. Gilda CreaseSchnier working on the right side the graft will be implanted.  At this point we proceeded with the tunneling maneuvers taking care to stay below the ureters which were identified and protected from harm. Blunt dissection was used from the femoral incision up to the retroperitoneum and an umbilical tape was brought through the tunnel on each side and would later be exchanged for the graft.  Additional heparin was given and we prepared the aortic clamp.  The clamp was just below the superior mesenteric artery and above the renal arteries.  The renal arteries were controlled with Vesseloops and the hypogastric clamp was placed on the distal aorta.  The artery was opened in the immediate infrarenal aorta but the disease was extremely extensive and had to be extended up to the pararenal aorta and down a little further onto the aorta.  Fairly extensive aortic endarterectomy was then required to gain luminal access for the bypass.  This was done with the [facility] and a large amount of bulky calcific plaque was removed and sent as a specimen.  The disease was also fairly extensive and both renal arteries and we proceeded by performing renal endarterectomies bilaterally.  The elevator was taken 2 to 3 cm and out the renal artery until the disease ended and a nice feathered distal endpoint was created with gentle traction first on the right renal artery.  Similarly, on the left this was taken out about 3 cm onto the renal artery until the disease had ended and a nice feathered distal  endpoint was created with gentle traction as well.  Good backbleeding was seen from both renal arteries after endarterectomy. All loose contents were removed. The aorta was forward flushed. The Dacron graft was then beveled and applied in an end graft to end aortic anastomosis using running 3-0 Prolene.  This was done in the pararenal aorta with the most proximal part of the suture line being just proximal to the renal arteries and it being beveled down so that the distal portion was about a centimeter below the renal arteries.  The distal aortic portion was oversewn with a 5-0 Prolene suture.  3-0 Prolene patch sutures were used for hemostasis and hemostasis was achieved.  We then flushed through the graft and clamped the graft for control. The grafts were then tunneled using the umbilical tape to been brought out before taking care to keep the orientation with the line on top and avoid twisting or kinking.  This was done on both sides.  The right limb  of the graft is then approximated to the arteriotomy, trimmed to an appropriate length and beveled and an end graft to side femoral artery anastomosis is fashioned including the graft over the origin of the superficial femoral artery over its first 3-4 cm. 5-0 Prolene is used to sew the anastomosis. Flushing maneuvers were performed and flow was established first to the profunda femoris artery and then to the superficial femoral artery. Easily palpable pulses are noted well beyond the anastomosis and both arteries.  The left limb of the graft was then cut and beveled to match the arteriotomy after tunneling this through the retroperitoneum. The anastomosis created in an end to side fashion with a 5-0 Prolene suture, and taken down well onto the superficial femoral aretry . Flushing maneuvers were performed and flow was reestablished to the femoral vessels. Excellent pulses noted in the left superficial femoral and profunda femoris artery below the femoral  anastomosis.   The retroperitoneal tissues are then irrigated with sterile saline and inspected for hemostasis Evicel, Fibrillar, and Vistacel is placed and the retroperitoneal tissues are reapproximated using running 0 Vicryl suture. The viscera was returned to its anatomic location and subsequently the fascia is closed with looped #1 PDS skin is closed with staples.  Fibrillar and Vistacel topical hemostatic agents were placed in the femoral incisions and hemostasis was complete. The femoral incisions were then closed in a layered fashion with 2 layers of 2-0 Vicryl, 2 layers of 3-0 Vicryl, and staples for the skin closure. Sterile dressing were then placed over all incisions.  The patient was then awakened from anesthesia and taken to the recovery room in stable condition having tolerated the procedure well.  COMPLICATIONS: None  CONDITION: Stable   This note was created with Dragon Medical transcription system. Any errors in dictation are purely unintentional.

## 2020-07-01 NOTE — Anesthesia Procedure Notes (Signed)
Arterial Line Insertion Start/EndFeb 25, 2022 8:49 AM, 07-10-20 8:52 AM Performed by: Cynda Familia, Irving Burton, CRNA, CRNA  Patient location: OR. Preanesthetic checklist: patient identified, IV checked, site marked, risks and benefits discussed, surgical consent, monitors and equipment checked, pre-op evaluation, timeout performed and anesthesia consent radial was placed Catheter size: 20 Fr Hand hygiene performed  and maximum sterile barriers used   Attempts: 1 Procedure performed without using ultrasound guided technique. Following insertion, dressing applied and Biopatch. Post procedure assessment: normal and unchanged  Patient tolerated the procedure well with no immediate complications. Additional procedure comments: Placed by Billey Gosling.

## 2020-07-01 NOTE — Op Note (Signed)
OPERATIVE NOTE   PROCEDURE: 1. Aortobifemoral bypass grafting with 14 x 7 bifurcated dacryon graft 2. Right renal endarterectomy 3. Left renal endarterectomy 4.   Right common femoral endarterectomy 5.   Right superficial femoral endarterectomy 6.   Left common femoral endarterectomy 7.   Left superficial femoral endarterectomy 8.   Catheter placement into the aorta by Dr. Gilda Crease right groin approach. 9.   Catheter placement into the aorta and subsequently catheter placement into the popliteal by Dr. Wyn Quaker left groin approach  PRE-OPERATIVE DIAGNOSIS: 1.Atherosclerotic occlusive disease bilateral lower extremities with rest pain 2. Aortic atherosclerosis  POST-OPERATIVE DIAGNOSIS: Same  SURGEON: Eduard Penkala, Latina Craver CO-surgeon:  Festus Barren, M.D.   ANESTHESIA:  general  ESTIMATED BLOOD LOSS: 2000 cc  FINDING(S): 1.  Severe atherosclerotic changes to the common femoral, profunda femoris and superficial femoral arteries bilaterally associated with bilateral iliac artery occlusion  SPECIMEN(S):  Plaque bilateral femoral artery systems  INDICATIONS:     Ivan Larsen is a 65 y.o. y.o. male who presents with ulceration of the left foot.  The risks and benefits as well as alternative therapies including intervention were reviewed in detail all questions were answered the patient agrees to proceed with surgery.  DESCRIPTION: After obtaining full informed written consent, the patient was brought back to the operating room and placed supine upon the operating table.  The patient received IV antibiotics prior to induction.  After obtaining adequate anesthesia, the patient was prepped and draped in the standard fashion appropriate time out is called.  Initially a central line is placed and this will be dictated as a separate note.  Co-surgeons are required because this is a complex bilateral procedure with work being performed simultaneously from both the patient's right and left sides.  This  also expedites the procedure making a shorter operative time reducing complications and improving patient safety.  Working with myself on the patient's right side initially and Dr. Wyn Quaker on the patient's left side, vertical incisions are made in both groins and the dissection is carried down to expose the femoral sheath. Femoral sheath is entered and the common femoral arteries identified. Dissection is then carried proximally to a level 1-2 cm above the ileo-inguinal ligament. The proximal common femoral artery is then looped with a Silastic vessel loop. The superficial femoral artery is then exposed for a distance of approximately 3-4 cm and looped with a Silastic vessel loop. The profunda femoris artery is then exposed working down to the quaternary branches using multiple blue Silastic Vesseloops each individual branches then looped with Silastic vessel loop. This is accomplished on both right and left sides.  Attention is then turned to the right common femoral artery.  An arteriotomy is made with 11 blade and extended with Potts scissors onto the profunda femoris. Endarterectomy is then performed with a Therapist, nutritional under direct visualization extending from the common femoral down into the superficial femoral artery to approximately 4 cm quite extensive as noted above in the description of the dissection.   Attention is then turned to the left common femoral artery.  An arteriotomy is made with 11 blade and extended with Potts scissors onto the profunda femoris. Endarterectomy is then performed with a Therapist, nutritional under direct visualization extending from the common femoral down into the profunda femoris to the level of the fourth order branches as noted above in the description of the dissection. The left superficial femoral artery is then treated with endarterectomy using the eversion technique extending the endarterectomy to  approximately 4 cm distal to the origin again quite extensive  At this  point the C arm was brought into the field bilateral 8 French sheaths were placed in the femoral arteries in a retrograde direction.  Approximately 3000 units of heparin was given.  With Dr. Wyn Quaker working on the patient's left myself working the patient right using a variety of wires and catheters catheters from both the right side and the left side were advanced up into the aorta.  True lumen at the level of the distal aorta was not obtained and therefore we elected to move forward with an aortobifemoral bypass.  At this point attention was then turned to the patient's left superficial femoral artery which is known to be occluded and given his ulcerations of the left foot optimizing distal flow was felt to be helpful to wound healing.  However, after an extensive attempt of regaining true lumen with the catheter located in the distal SFA and popliteal we were unable to move forward with stenting.  Blunt dissection is then used in the anterior wall of the artery to fashion a portion of the tunnel for the bypass graft. Attention is then turned to the abdomen  The midline incision is then created and the dissection carried down to expose the fascia. The peritoneal cavity is entered without difficulty just below the xiphoid process. As the incision is extended to the pubis. The omentum was then reflected superiorly and the small intestine is swept into the right gutter. Small intestine was then packed away with a large laparotomy pad which is moistened with saline.  The Omni-Tract retractor was then used to help facilitate our exposure.  The retroperitoneum is then opened along the midline overlying the pulse which is palpable at the level of the duodenum. The inferior mesenteric vein is identified and skeletonized so that he is mobilized and able to be retracted superiorly and left laterally out of the field. The renal vein is then identified. At this point the Omni-Tract is positioned to allow full exposure of  the aorta.  The retroperitoneal dissection is then carried inferiorly past the palpable aortic bifurcation. Moving more proximally the aorta is dissected circumferentially at the level of the renal vein and once the renal vein had been mobilized it was retracted with a vein retractor and the dissection carried to the level just above both the right and left renal arteries.  The right and left renal arteries were then dissected circumferentially and looped with red Silastic Vesseloops.  The aorta was dissected circumferentially in the region proximal to the renal arteries but below the superior mesenteric artery.  The dissection is then carried down to the right common iliac. Attention is then turned to the left common iliac which is dissected. Tunnels were then fashioned using blunt dissection along the anterior margin of the iliac arteries extending out to the groin incisions. And subsequently umbilical tapes are placed both right and left side.  Additional heparin was given and allowed circulate for 5 minutes. An 14 x 7 Dacron bifurcated graft is then selected and rehydrated on the back table. With myself working on the left side and Dr. Wyn Quaker working on the right side the graft will be implanted.  The aorta was clamped just below the level of the right renal and an aortotomy is made with 11 blade and extended with Potts scissors the aortic contents are then removed under direct visualization.  Addressing the right renal artery first endarterectomy of the right renal artery  was then performed using the eversion technique with a freer elevator.  Approximately 12 to 15 mm in length of plaque was removed.  This was passed off this field as a individual specimen.  The renal artery was then flushed with heparinized saline.  Attention was then turned to the left side where again endarterectomy was performed using a freer elevator and an eversion technique.  This time a greater than 15 mm length of plaque with an  excellent feathered edge was retrieved.  The renal arteries then forward flushed with heparinized saline and reclamped.  The distal aortic opening was then oversewn with 5-0 Prolene suture in a running fashion.  The aorta was forward flushed. The Dacron graft was then beveled and applied in an end graft to side aortic anastomosis using running 4-0 Prolene. Flushing maneuvers were performed and flow was established back to the distal aorta the graft is clamped just above the suture line.   Working on the right side first the right limb of the graft is pulled through the tunnel. It is then reflected superiorly out of the way.  The right limb of the graft is then approximated to the arteriotomy trimmed to an appropriate bevel and an end graft to side common femoral and profunda femoris artery anastomosis is fashioned. 5-0 Prolene is used to sew the anastomosis. Flushing maneuvers were performed and flow was established first to the common femoral in a retrograde fashion and then the profunda femoris and superficial femoral arteries. Easily palpable pulses are noted in the profunda femoris distal to the anastomosis. The graft itself has an excellent pulse.  The left limb of the graft is then approximated to the arteriotomy trimmed to an appropriate bevel and an end graft to side common and profunda femoris artery anastomosis is fashioned. 5-0 Prolene is used to sew the anastomosis. Flushing maneuvers were performed and flow was established first to the common femoral in a retrograde fashion and then the profunda femoris and superficial femoral. Easily palpable pulses are noted in the profunda femoris distal to the anastomosis. The graft itself has an excellent pulse.  The retroperitoneal tissues are then irrigated with sterile saline and inspected for hemostasis Evicel with Surgicel is placed and the retroperitoneal tissues are reapproximated using running 0 Vicryl suture. The viscera was returned to its  anatomic location and subsequently the fascia is closed with looped #1 PDS.  Attention is then turned to both groin incisions which were irrigated AdvaSeal and Surgicel are placed and the groins are reapproximated using a total of 4 layers of Vicryl with 2 layers of 2-0 Vicryl in a running fashion followed by 2 layers of 3-0 Vicryl in a running fashion followed by 4-0 Monocryl subcuticular. The abdominal incision is closed with 4-0 Monocryl subcuticular.   COMPLICATIONS: None  CONDITION: Almon Register 11/19/2014 4:18 PM

## 2020-07-01 NOTE — Procedures (Signed)
Central Venous Catheter Insertion Procedure Note  Ivan Larsen  [id number]  January 18, 1956  Date:06/16/2020  Time:8:24 PM   Provider Performing:[facility]   Procedure: Insertion of Non-tunneled Central Venous [id number]) with US guidance (26834)   Indication(s) Medication administration  Consent Risks of the procedure as well as the alternatives and risks of each were explained to the patient and/or caregiver.  Consent for the procedure was obtained and is signed in the bedside chart  Anesthesia Topical only with 1% lidocaine   Timeout Verified patient identification, verified procedure, site/side was marked, verified correct patient position, special equipment/implants available, medications/allergies/relevant history reviewed, required imaging and test results available.  Sterile Technique Maximal sterile technique including full sterile barrier drape, hand hygiene, sterile gown, sterile gloves, mask, hair covering, sterile ultrasound probe cover (if used).  Procedure Description Area of catheter insertion was cleaned with chlorhexidine and draped in sterile fashion.  With real-time ultrasound guidance a central venous catheter was placed into the right internal jugular vein. Nonpulsatile blood flow and easy flushing noted in all ports.  The catheter was sutured in place and sterile dressing applied.  Complications/Tolerance None; patient tolerated the procedure well. Chest X-ray is ordered to verify placement for internal jugular or subclavian cannulation.   Chest x-ray is not ordered for femoral cannulation.  EBL Minimal  Specimen(s) None  Ivan Larsen, AGNP  Pulmonary/Critical Care Pager [phone] (please enter 7 digits) PCCM Consult Pager [phone] (please enter 7 digits)

## 2020-07-01 NOTE — Transfer of Care (Signed)
Immediate Anesthesia Transfer of Care Note  Patient: Ivan Larsen  Procedure(s) Performed: AORTA BIFEMORAL BYPASS GRAFT (VERSUS BILATERAL FEMORAL ENDARTERECTOMIES) (N/A ) APPLICATION OF CELL SAVER (N/A )  Patient Location: ICU  Anesthesia Type:General  Level of Consciousness: Patient remains intubated per anesthesia plan  Airway & Oxygen Therapy: Patient remains intubated per anesthesia plan and Patient placed on Ventilator (see vital sign flow sheet for setting)  Post-op Assessment: Report given to RN and Post -op Vital signs reviewed and stable  Post vital signs: Reviewed and stable  Last Vitals:  Vitals Value Taken Time  BP 106/77 06/24/2020 1627  Temp    Pulse 95 06/16/2020 1629  Resp 18 06/30/2020 1629  SpO2 100 % 06/18/2020 1629  Vitals shown include unvalidated device data.  Last Pain:  Vitals:   07/11/2020 0801  TempSrc: Temporal  PainSc: 0-No pain         Complications: No complications documented.

## 2020-07-02 ENCOUNTER — Inpatient Hospital Stay: Payer: Medicare Other

## 2020-07-02 ENCOUNTER — Encounter: Payer: Self-pay | Admitting: Vascular Surgery

## 2020-07-02 LAB — PREPARE PLATELET PHERESIS
Unit division: 0
Unit division: 0
Unit division: 0
Unit division: 0

## 2020-07-02 LAB — URINALYSIS, COMPLETE (UACMP) WITH MICROSCOPIC
Bilirubin Urine: NEGATIVE
Glucose, UA: >=500 mg/dL — AB
Ketones, ur: 20 mg/dL — AB
Leukocytes,Ua: NEGATIVE
Nitrite: NEGATIVE
Protein, ur: 30 mg/dL — AB
Specific Gravity, Urine: 1.013 (ref 1.005–1.030)
Squamous Epithelial / HPF: NONE SEEN (ref 0–5)
pH: 5 (ref 5.0–8.0)

## 2020-07-02 LAB — BPAM PLATELET PHERESIS
Blood Product Expiration Date: 202202172359
Blood Product Expiration Date: 202202192359
Blood Product Expiration Date: 202202172359
Blood Product Expiration Date: 202202192359
ISSUE DATE / TIME: 202202160845
ISSUE DATE / TIME: 202202160845
ISSUE DATE / TIME: 202202161319
ISSUE DATE / TIME: 202202161440
Unit Type and Rh: 1700
Unit Type and Rh: 7300
Unit Type and Rh: 5100
Unit Type and Rh: 5100

## 2020-07-02 LAB — HEMOGLOBIN AND HEMATOCRIT, BLOOD
HCT: 34.1 % — ABNORMAL LOW (ref 39.0–52.0)
HCT: 31.1 % — ABNORMAL LOW (ref 39.0–52.0)
HCT: 27.9 % — ABNORMAL LOW (ref 39.0–52.0)
Hemoglobin: 11.5 g/dL — ABNORMAL LOW (ref 13.0–17.0)
Hemoglobin: 11 g/dL — ABNORMAL LOW (ref 13.0–17.0)
Hemoglobin: 9.9 g/dL — ABNORMAL LOW (ref 13.0–17.0)

## 2020-07-02 LAB — HEPATIC FUNCTION PANEL
ALT: 524 U/L — ABNORMAL HIGH (ref 0–44)
ALT: 777 U/L — ABNORMAL HIGH (ref 0–44)
AST: 554 U/L — ABNORMAL HIGH (ref 15–41)
AST: 849 U/L — ABNORMAL HIGH (ref 15–41)
Albumin: 2.6 g/dL — ABNORMAL LOW (ref 3.5–5.0)
Albumin: 2.4 g/dL — ABNORMAL LOW (ref 3.5–5.0)
Alkaline Phosphatase: 42 U/L (ref 38–126)
Alkaline Phosphatase: 46 U/L (ref 38–126)
Bilirubin, Direct: 0.3 mg/dL — ABNORMAL HIGH (ref 0.0–0.2)
Bilirubin, Direct: 0.2 mg/dL (ref 0.0–0.2)
Indirect Bilirubin: 0.5 mg/dL (ref 0.3–0.9)
Indirect Bilirubin: 0.4 mg/dL (ref 0.3–0.9)
Total Bilirubin: 0.8 mg/dL (ref 0.3–1.2)
Total Bilirubin: 0.6 mg/dL (ref 0.3–1.2)
Total Protein: 4 g/dL — ABNORMAL LOW (ref 6.5–8.1)
Total Protein: 3.9 g/dL — ABNORMAL LOW (ref 6.5–8.1)

## 2020-07-02 LAB — BLOOD GAS, ARTERIAL
Acid-Base Excess: 7.8 mmol/L — ABNORMAL HIGH (ref 0.0–2.0)
Acid-base deficit: 17.1 mmol/L — ABNORMAL HIGH (ref 0.0–2.0)
Acid-base deficit: 14.7 mmol/L — ABNORMAL HIGH (ref 0.0–2.0)
Bicarbonate: 10.5 mmol/L — ABNORMAL LOW (ref 20.0–28.0)
Bicarbonate: 12.1 mmol/L — ABNORMAL LOW (ref 20.0–28.0)
Bicarbonate: 30.2 mmol/L — ABNORMAL HIGH (ref 20.0–28.0)
FIO2: 0.4
FIO2: 0.35
FIO2: 35
MECHVT: 500 mL
MECHVT: 500 mL
MECHVT: 500 mL
Mechanical Rate: 18
Mechanical Rate: 18
Mechanical Rate: 18
O2 Saturation: 98.5 %
O2 Saturation: 98.3 %
O2 Saturation: 99.2 %
PEEP: 5 cmH2O
PEEP: 5 cmH2O
PEEP: 5 cmH2O
Patient temperature: 37
Patient temperature: 37
Patient temperature: 37
pCO2 arterial: 30 mmHg — ABNORMAL LOW (ref 32.0–48.0)
pCO2 arterial: 31 mmHg — ABNORMAL LOW (ref 32.0–48.0)
pCO2 arterial: 33 mmHg (ref 32.0–48.0)
pH, Arterial: 7.15 — CL (ref 7.350–7.450)
pH, Arterial: 7.2 — ABNORMAL LOW (ref 7.350–7.450)
pH, Arterial: 7.57 — ABNORMAL HIGH (ref 7.350–7.450)
pO2, Arterial: 145 mmHg — ABNORMAL HIGH (ref 83.0–108.0)
pO2, Arterial: 132 mmHg — ABNORMAL HIGH (ref 83.0–108.0)
pO2, Arterial: 121 mmHg — ABNORMAL HIGH (ref 83.0–108.0)

## 2020-07-02 LAB — PHOSPHORUS: Phosphorus: 9.1 mg/dL — ABNORMAL HIGH (ref 2.5–4.6)

## 2020-07-02 LAB — CBC
HCT: 37.1 % — ABNORMAL LOW (ref 39.0–52.0)
Hemoglobin: 11.9 g/dL — ABNORMAL LOW (ref 13.0–17.0)
MCH: 26.5 pg (ref 26.0–34.0)
MCHC: 32.1 g/dL (ref 30.0–36.0)
MCV: 82.6 fL (ref 80.0–100.0)
Platelets: 158 10*3/uL (ref 150–400)
RBC: 4.49 MIL/uL (ref 4.22–5.81)
RDW: 24.4 % — ABNORMAL HIGH (ref 11.5–15.5)
WBC: 27.3 10*3/uL — ABNORMAL HIGH (ref 4.0–10.5)
nRBC: 0 % (ref 0.0–0.2)

## 2020-07-02 LAB — GLUCOSE, CAPILLARY
Glucose-Capillary: 213 mg/dL — ABNORMAL HIGH (ref 70–99)
Glucose-Capillary: 244 mg/dL — ABNORMAL HIGH (ref 70–99)
Glucose-Capillary: 265 mg/dL — ABNORMAL HIGH (ref 70–99)
Glucose-Capillary: 335 mg/dL — ABNORMAL HIGH (ref 70–99)
Glucose-Capillary: 336 mg/dL — ABNORMAL HIGH (ref 70–99)
Glucose-Capillary: 205 mg/dL — ABNORMAL HIGH (ref 70–99)

## 2020-07-02 LAB — MAGNESIUM: Magnesium: 1.4 mg/dL — ABNORMAL LOW (ref 1.7–2.4)

## 2020-07-02 LAB — BASIC METABOLIC PANEL WITH GFR
Anion gap: 33 — ABNORMAL HIGH (ref 5–15)
BUN: 22 mg/dL (ref 8–23)
CO2: 12 mmol/L — ABNORMAL LOW (ref 22–32)
Calcium: 6.6 mg/dL — ABNORMAL LOW (ref 8.9–10.3)
Chloride: 105 mmol/L (ref 98–111)
Creatinine, Ser: 1.87 mg/dL — ABNORMAL HIGH (ref 0.61–1.24)
GFR, Estimated: 39 mL/min — ABNORMAL LOW
Glucose, Bld: 234 mg/dL — ABNORMAL HIGH (ref 70–99)
Potassium: 3.4 mmol/L — ABNORMAL LOW (ref 3.5–5.1)
Sodium: 150 mmol/L — ABNORMAL HIGH (ref 135–145)

## 2020-07-02 LAB — LACTIC ACID, PLASMA: Lactic Acid, Venous: >11 mmol/L (ref 0.5–1.9)

## 2020-07-02 LAB — CK
Total CK: 114 U/L (ref 49–397)
Total CK: 178 U/L (ref 49–397)

## 2020-07-02 LAB — PROCALCITONIN: Procalcitonin: 1.3 ng/mL

## 2020-07-02 IMAGING — US US RENAL
1 series · 14 of 25 positions shown · non-contrast
Comparison: CT angio abdomen and pelvis [DATE]

CLINICAL DATA: Acute renal failure, history diabetes mellitus,
coronary artery disease post MI, peripheral vascular disease,
hypertension, hyperlipidemia CT, former smoker

EXAM:
RENAL / URINARY TRACT ULTRASOUND COMPLETE

[Series 1: us renal · 14 of 28 slices shown]
[im 1/28]
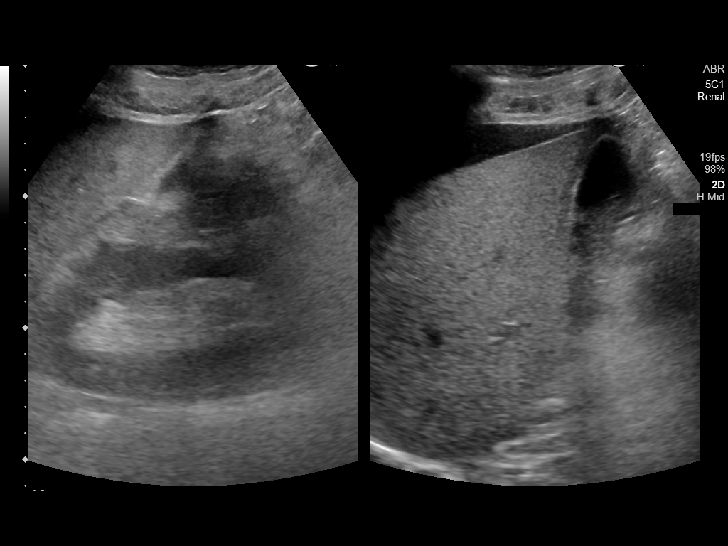
[im 3/28]
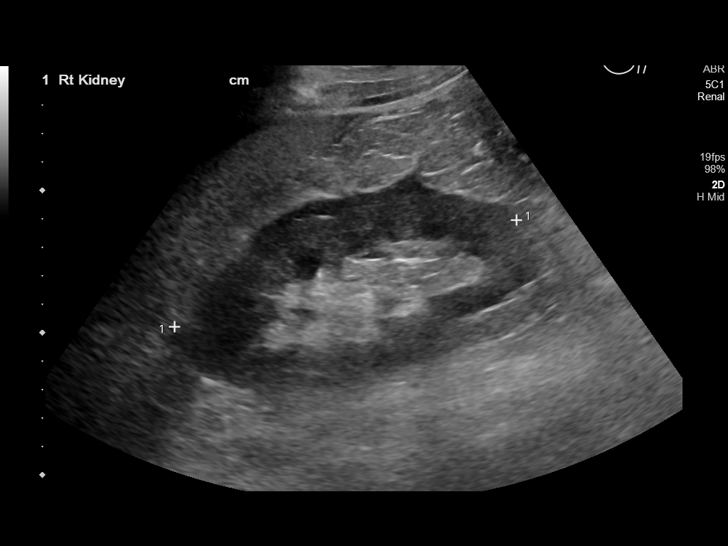
[im 5/28]
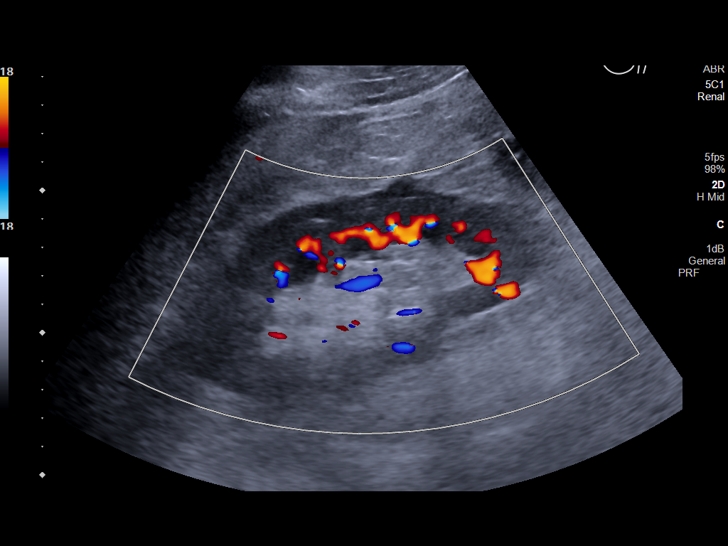
[im 7/28]
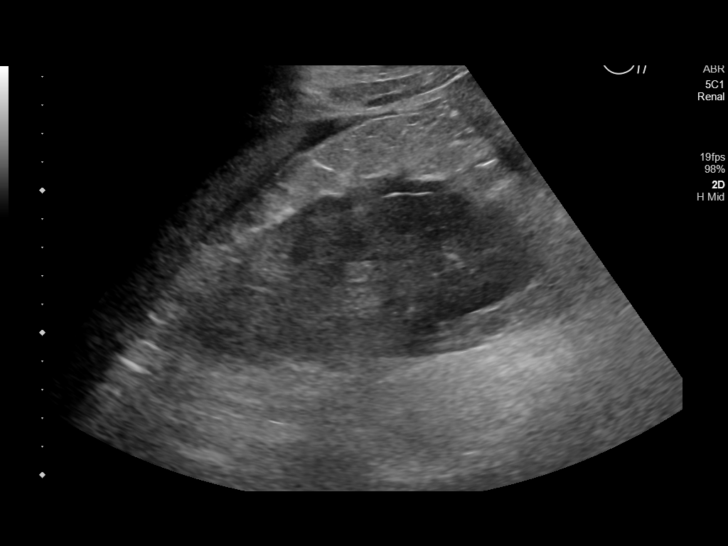
[im 10/28]
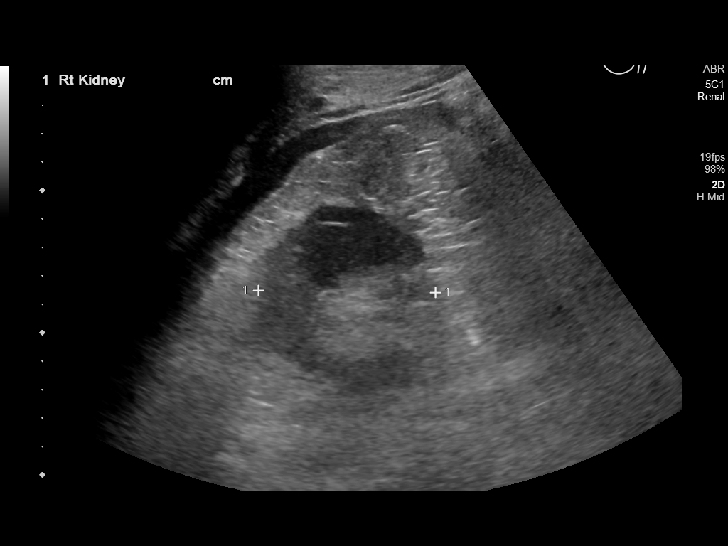
[im 11/28]
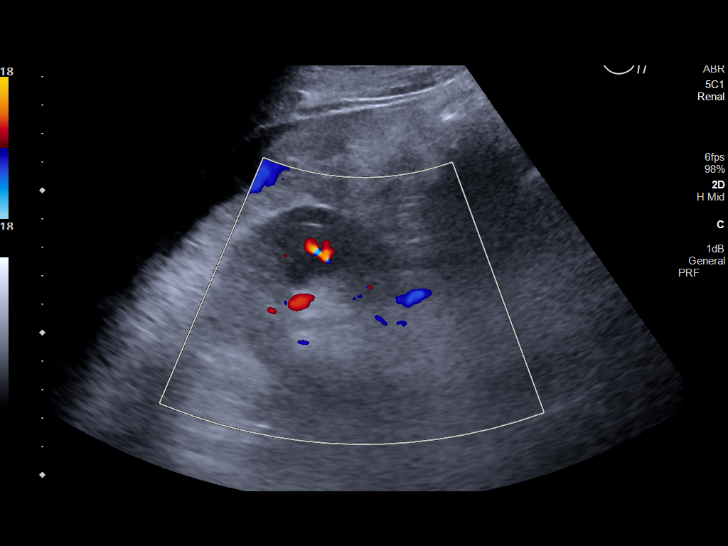
[im 13/28]
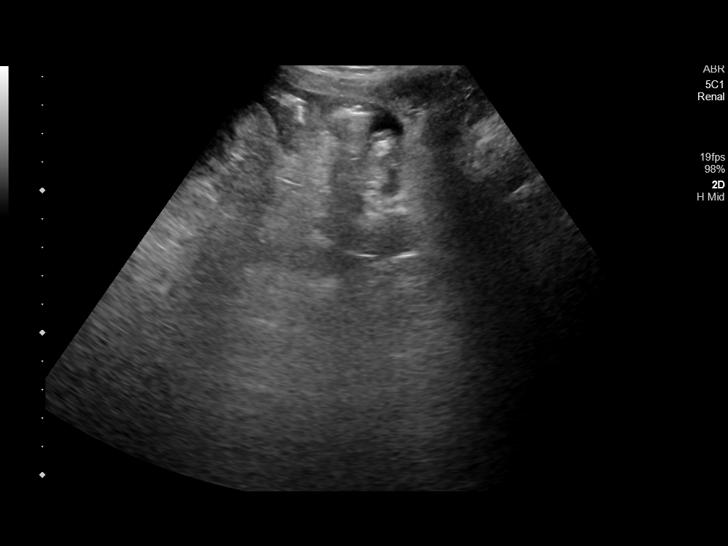
[im 15/28]
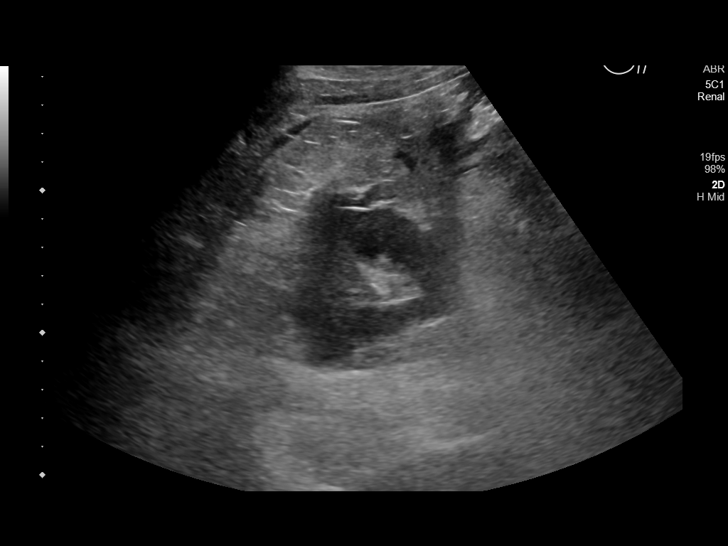
[im 17/28]
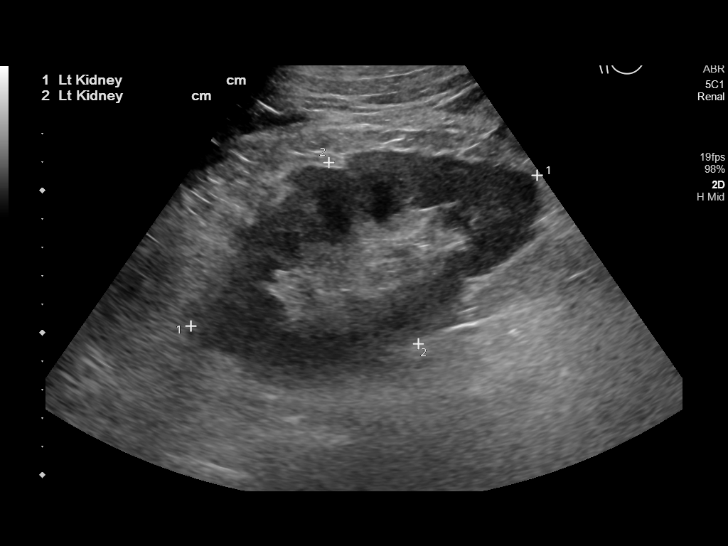
[im 19/28]
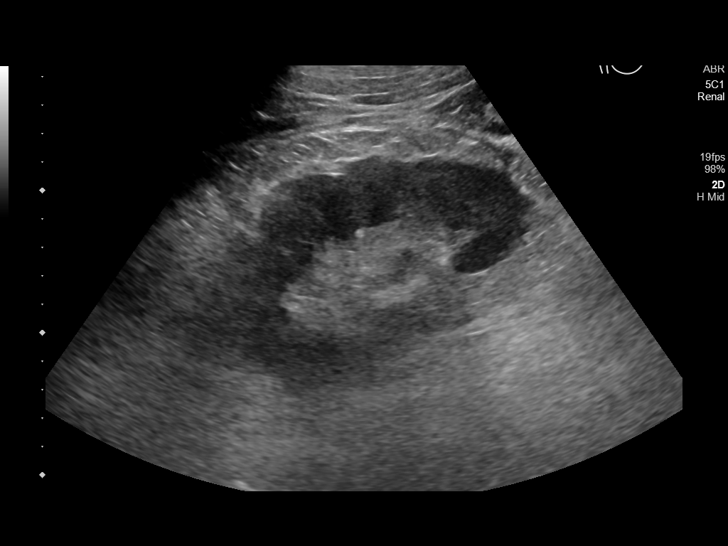
[im 21/28]
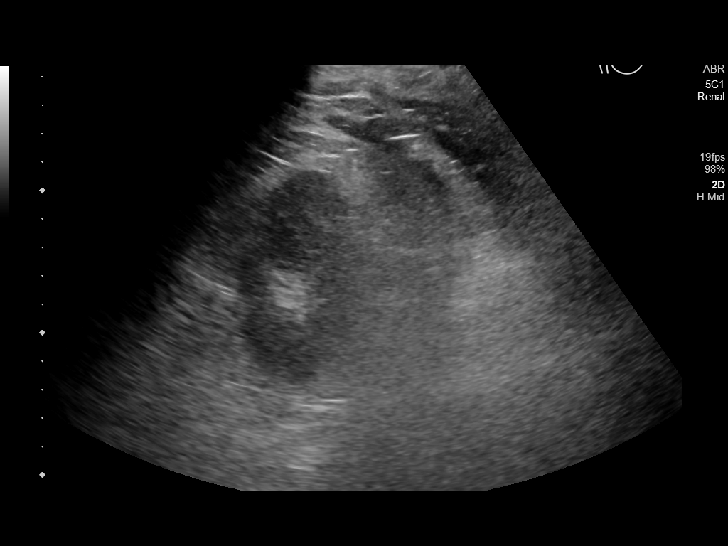
[im 23/28]
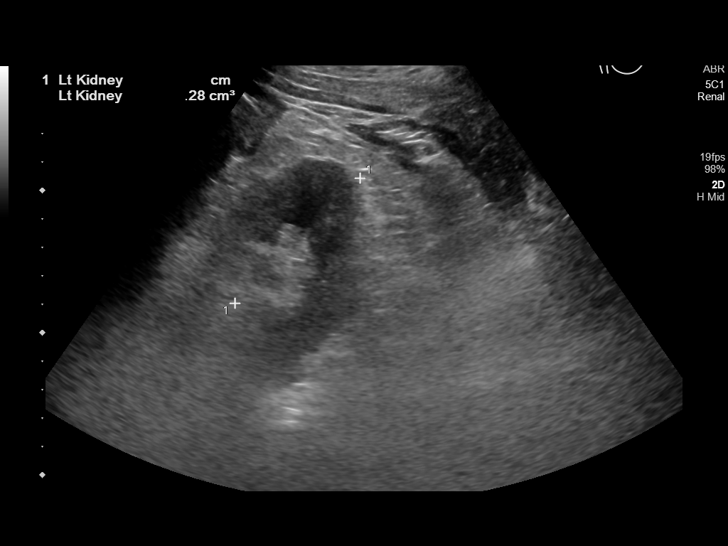
[im 25/28]
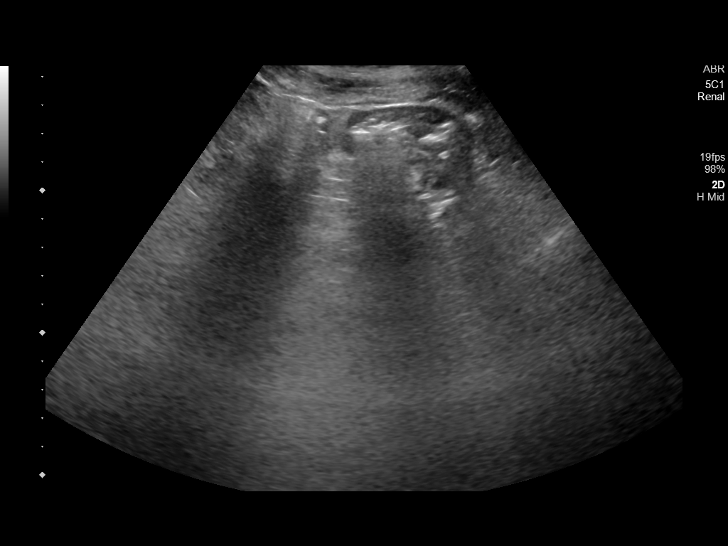
[im 28/28]
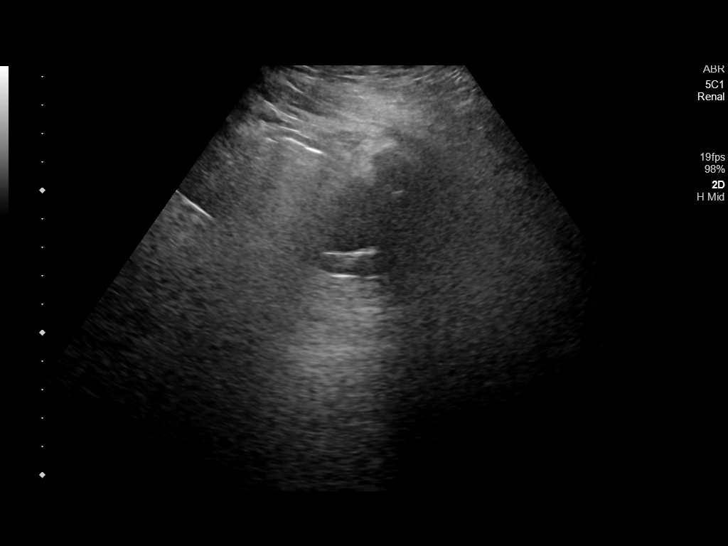

[14 of 25 positions shown; findings below may reference images not displayed]

FINDINGS: Right Kidney:

Renal measurements: 12.6 x 6.1 x 6.2 cm = volume: 251 mL. Normal
cortical thickness. Borderline increased cortical echogenicity. No
mass or hydronephrosis. No shadowing calcification. Minimal
perinephric fluid.

Left Kidney:

Renal measurements: 13.3 x 7.1 x 6.2 cm = volume: 307 mL. Normal
cortical thickness. Borderline increased cortical echogenicity. No
mass, hydronephrosis, or shadowing calcification.

Bladder:

Decompressed by Foley catheter, unable to evaluate.

Other:

Minimal ascites.  Question fatty infiltration of liver.
IMPRESSION: Question medical renal disease changes of both kidneys.

No evidence of renal mass or hydronephrosis.

Minimal ascites.

## 2020-07-02 IMAGING — DX DG CHEST 1V PORT
1 series · 1 of 1 positions shown · non-contrast
Comparison: [DATE]

CLINICAL DATA: Encounter for central line placement

EXAM:
PORTABLE CHEST 1 VIEW

[chest ap]
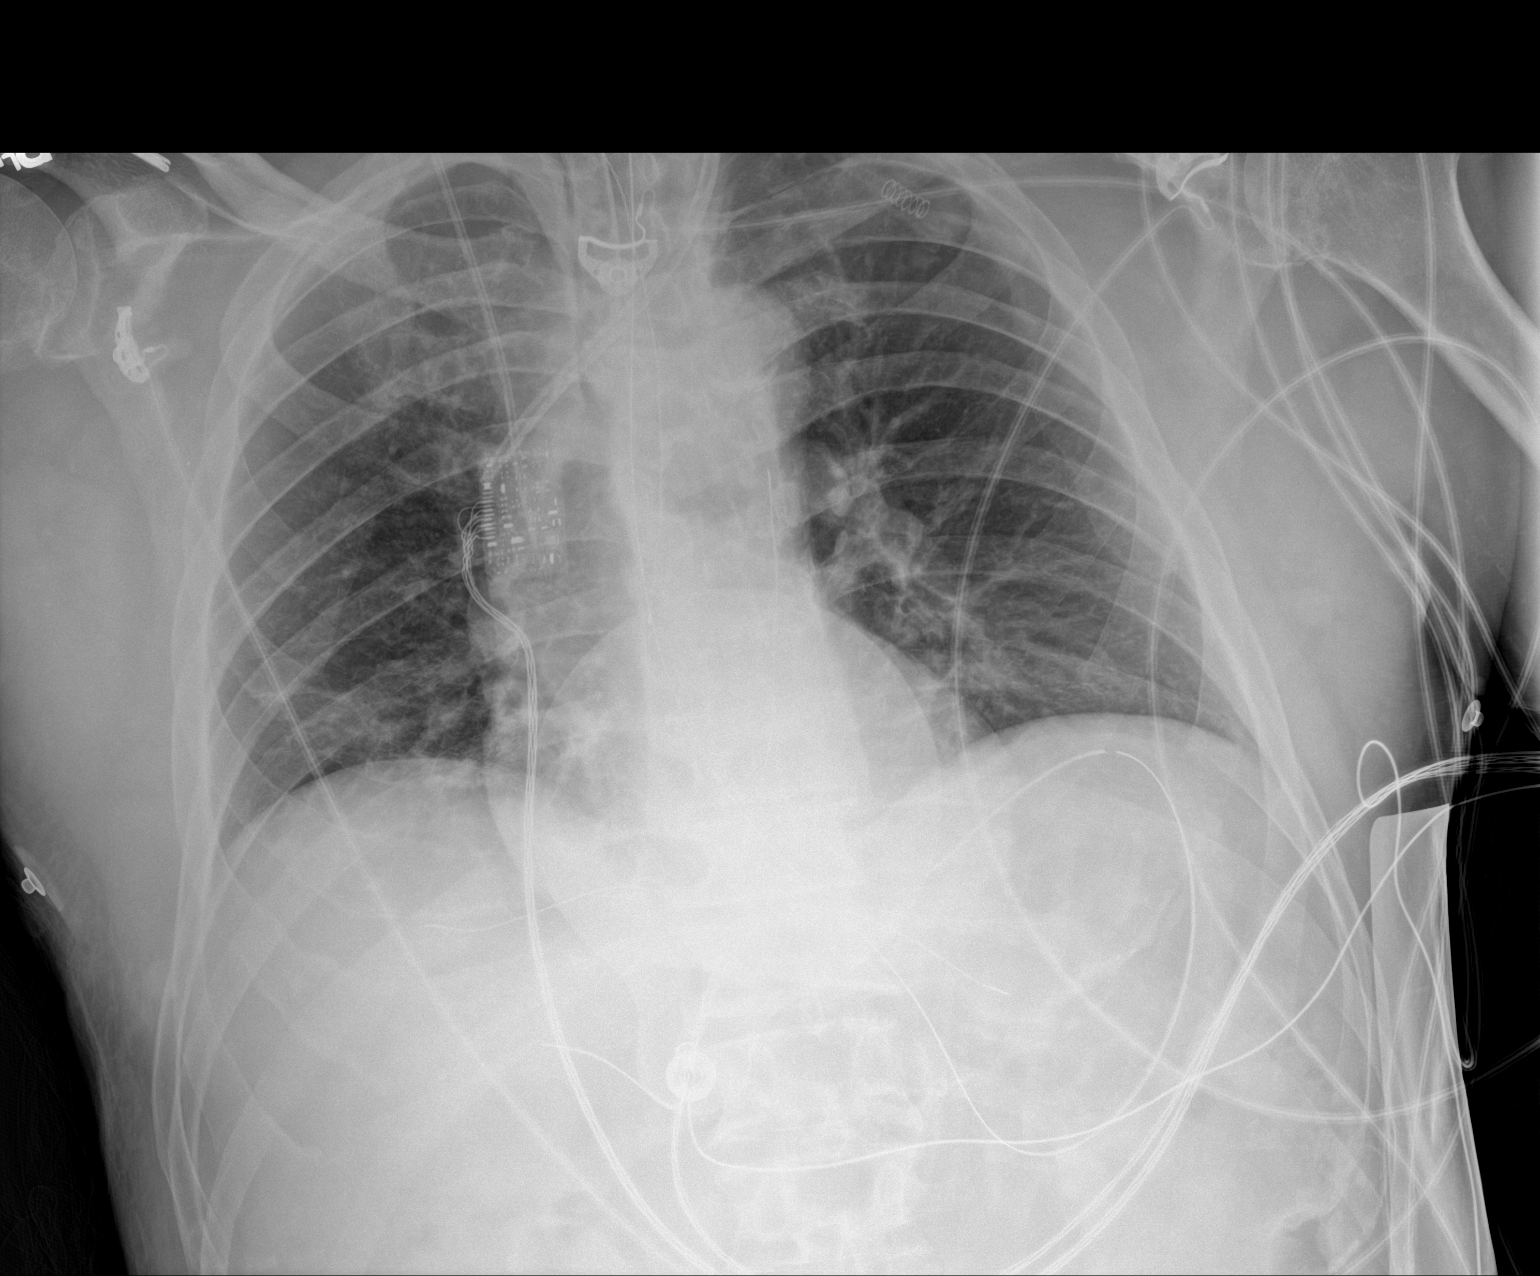

[1 of 1 positions shown; findings below may reference images not displayed]

FINDINGS: Normal heart size and mediastinal contours. Mild indistinct opacity
at the bases. Lung volumes are low. Bilateral central line with new
left-sided line tip at the upper SVC. No pneumothorax. The enteric
tube loops at the stomach. The endotracheal tube tip is just below
the clavicular heads.
IMPRESSION: 1. New left IJ line without complicating feature.
2. Mild atelectatic opacity at the bases.

## 2020-07-02 MED ORDER — MAGNESIUM SULFATE 4 GM/100ML IV SOLN
4.0000 g | Freq: Once | INTRAVENOUS | Status: AC
  Administered 2020-07-02: 4 g via INTRAVENOUS
  Filled 2020-07-02: qty 100

## 2020-07-02 MED ORDER — INSULIN ASPART 100 UNIT/ML ~~LOC~~ SOLN
0.0000 [IU] | SUBCUTANEOUS | Status: DC
  Administered 2020-07-02: 7 [IU] via SUBCUTANEOUS
  Administered 2020-07-03: 3 [IU] via SUBCUTANEOUS
  Administered 2020-07-03: 4 [IU] via SUBCUTANEOUS
  Administered 2020-07-03: 3 [IU] via SUBCUTANEOUS
  Administered 2020-07-03: 7 [IU] via SUBCUTANEOUS
  Administered 2020-07-03: 11 [IU] via SUBCUTANEOUS
  Administered 2020-07-04 – 2020-07-07 (×5): 3 [IU] via SUBCUTANEOUS
  Administered 2020-07-07: 4 [IU] via SUBCUTANEOUS
  Filled 2020-07-02 (×12): qty 1

## 2020-07-02 MED ORDER — SODIUM BICARBONATE 8.4 % IV SOLN
INTRAVENOUS | Status: DC
  Filled 2020-07-02 (×4): qty 850
  Filled 2020-07-02: qty 150
  Filled 2020-07-02: qty 850
  Filled 2020-07-02: qty 150

## 2020-07-02 MED ORDER — DEXMEDETOMIDINE HCL IN NACL 400 MCG/100ML IV SOLN
INTRAVENOUS | Status: AC
  Administered 2020-07-02: 0.6 ug/kg/h via INTRAVENOUS
  Filled 2020-07-02: qty 100

## 2020-07-02 MED ORDER — INSULIN ASPART 100 UNIT/ML ~~LOC~~ SOLN
0.0000 [IU] | SUBCUTANEOUS | Status: DC
  Administered 2020-07-02: 7 [IU] via SUBCUTANEOUS
  Administered 2020-07-02: 3 [IU] via SUBCUTANEOUS
  Administered 2020-07-02: 7 [IU] via SUBCUTANEOUS
  Administered 2020-07-02: 3 [IU] via SUBCUTANEOUS
  Administered 2020-07-02: 5 [IU] via SUBCUTANEOUS
  Filled 2020-07-02 (×5): qty 1

## 2020-07-02 MED ORDER — ANTICOAGULANT SODIUM CITRATE 4% (200MG/5ML) IV SOLN
5.0000 mL | Status: DC | PRN
  Filled 2020-07-02: qty 5

## 2020-07-02 MED ORDER — DEXMEDETOMIDINE HCL IN NACL 400 MCG/100ML IV SOLN
0.4000 ug/kg/h | INTRAVENOUS | Status: DC
  Administered 2020-07-03: 0.8 ug/kg/h via INTRAVENOUS
  Administered 2020-07-03: 0.6 ug/kg/h via INTRAVENOUS
  Administered 2020-07-03: 0.8 ug/kg/h via INTRAVENOUS
  Administered 2020-07-04: 1 ug/kg/h via INTRAVENOUS
  Filled 2020-07-02 (×5): qty 100

## 2020-07-02 NOTE — Progress Notes (Signed)
Inpatient Diabetes Program Recommendations  AACE/ADA: New Consensus Statement on Inpatient Glycemic Control (2015)  Target Ranges:  Prepandial:   less than 140 mg/dL      Peak postprandial:   less than 180 mg/dL (1-2 hours)      Critically ill patients:  140 - 180 mg/dL   Lab Results  Component Value Date   GLUCAP 244 (H) 07/02/2020   HGBA1C 6.3 06/02/2020    Review of Glycemic Control Results for [facility], BARBATO (MRN [id number]) as of 07/02/2020 11:16  Ref. Range 06/20/2020 07:56 07/12/2020 16:19 07/02/2020 03:44 07/02/2020 07:31  Glucose-Capillary Latest Ref Range: 70 - 99 mg/dL 349 (H) 179 (H) 150 (H) 244 (H)   Diabetes history: DM 2 Outpatient Diabetes medications:  Jardiance 25 mg daily, Metformin 1000 mg bid, Januvia 50 mg daily Current orders for Inpatient glycemic control:  Novolog sensitive q 4 hours Inpatient Diabetes Program Recommendations:   Please consider adding Levemir 6 units bid.    Thanks,  Beryl Meager, RN, BC-ADM Inpatient Diabetes Coordinator Pager [phone] (8a-5p)

## 2020-07-02 NOTE — Procedures (Signed)
Central Venous Catheter Insertion Procedure Note  Ivan Larsen  [id number]  Jul 22, 1955  Date:07/02/20  Time:6:02 AM   Provider Performing:[facility]   Procedure: Insertion of Non-tunneled Central Venous Catheter([id number])with US guidance (12458)    Indication(s) Hemodialysis  Consent Risks of the procedure as well as the alternatives and risks of each were explained to the patient and/or caregiver.  Consent for the procedure was obtained and is signed in the bedside chart  Anesthesia Topical only with 1% lidocaine   Timeout Verified patient identification, verified procedure, site/side was marked, verified correct patient position, special equipment/implants available, medications/allergies/relevant history reviewed, required imaging and test results available.  Sterile Technique Maximal sterile technique including full sterile barrier drape, hand hygiene, sterile gown, sterile gloves, mask, hair covering, sterile ultrasound probe cover (if used).  Procedure Description Area of catheter insertion was cleaned with chlorhexidine and draped in sterile fashion.   With real-time ultrasound guidance a HD catheter was placed into the left internal jugular vein.  Nonpulsatile blood flow and easy flushing noted in all ports.  The catheter was sutured in place and sterile dressing applied.  Complications/Tolerance None; patient tolerated the procedure well. Chest X-ray is ordered to verify placement for internal jugular or subclavian cannulation.  Chest x-ray is not ordered for femoral cannulation.  EBL Minimal  Specimen(s) None  [name], AGNP  Pulmonary/Critical Care Pager [phone] (please enter 7 digits) PCCM Consult Pager [phone] (please enter 7 digits)

## 2020-07-02 NOTE — Progress Notes (Signed)
PT Cancellation Note  Patient Details Name: Ivan Larsen MRN: [id number] DOB: [birth year]    Cancelled Treatment:    Reason Eval/Treat Not Completed: Patient not medically ready. Patient remains intubated, sedated, and anticipated to start  CRRT today. PT will sign off at this time given ongoing medical issues. Please re-consult PT as needed when medically appropriate.    Donna Bernard, PT, MPT  Ina Homes 07/02/2020, 11:32 AM

## 2020-07-02 NOTE — Anesthesia Postprocedure Evaluation (Signed)
Anesthesia Post Note  Patient: Ivan Larsen  Procedure(s) Performed: AORTA BIFEMORAL BYPASS GRAFT (VERSUS BILATERAL FEMORAL ENDARTERECTOMIES) (N/A ) APPLICATION OF CELL SAVER (N/A )  Patient location during evaluation: SICU Anesthesia Type: General Level of consciousness: sedated Pain management: pain level controlled Vital Signs Assessment: post-procedure vital signs reviewed and stable Respiratory status: patient remains intubated per anesthesia plan Cardiovascular status: stable Postop Assessment: no apparent nausea or vomiting Anesthetic complications: no   No complications documented.   Last Vitals:  Vitals:   07/02/20 0600 07/02/20 0700  BP: 108/62 117/60  Pulse: 91 88  Resp: 18 18  Temp: (!) 38 C (!) 38.1 C  SpO2: 100% 100%    Last Pain:  Vitals:   07/02/20 0105  TempSrc: Esophageal  PainSc:                  Rosanne Gutting

## 2020-07-02 NOTE — Progress Notes (Signed)
CRITICAL CARE PROGRESS NOTE    Name: Ivan Larsen MRN: [id number] DOB: [birth year]     LOS: 1   SUBJECTIVE FINDINGS & SIGNIFICANT EVENTS    Patient description:   65 yo with hx of severe peripheral vascular disease, dm, dyslipidemia came in for vascular surgery procedure.  After surgery patient was in circulatory shock and was emergently brought to MICU due to medical instability. Intraop he lost apx 2L blood with 1/2 returned via blood saver, received IVF fluids.  He required vasopressor support perioperatively.     07/02/2020- patient is on vasopressors with levo/vaso/neo, BP is stable post IVF and pRBC.  Reviewed plan with nephrology will monitor for CRRT.     Lines/tubes : Airway 7.5 mm (Active)  Secured at (cm) 23 cm 07/09/2020 1630  Measured From Lips 06/25/2020 1630  Secured Location Right 07/10/2020 1630  Secured By [employer] 07/05/2020 1630  Prone position No 06/22/2020 1630  Site Condition Cool 06/19/2020 1630     Arterial Line 06/18/2020 Radial (Active)     NG/OG Tube Nasogastric 16 Fr. Left nare Confirmed by Surgical Manipulation Measured external length of tube (Active)     Urethral Catheter Godwin Non-latex 16 Fr. (Active)    Microbiology/Sepsis markers: Results for orders placed or performed during the hospital encounter of 06/24/2020  MRSA PCR Screening     Status: None   Collection Time: 06/23/2020  4:32 PM   Specimen: Nasopharyngeal  Result Value Ref Range Status   MRSA by PCR NEGATIVE NEGATIVE Final    Comment:        The GeneXpert MRSA Assay (FDA approved for NASAL specimens only), is one component of a comprehensive MRSA colonization surveillance program. It is not intended to diagnose MRSA infection nor to guide or monitor treatment for MRSA infections. Performed at  [facility], [address] [place], [place] [address]   CULTURE, BLOOD (ROUTINE X 2) w Reflex to ID Panel     Status: None (Preliminary result)   Collection Time: 07/06/2020  6:07 PM   Specimen: BLOOD  Result Value Ref Range Status   Specimen Description BLOOD BLOOD RIGHT HAND  Final   Special Requests   Final    BOTTLES DRAWN AEROBIC AND ANAEROBIC Blood Culture results may not be optimal due to an inadequate volume of blood received in culture bottles   Culture   Final    NO GROWTH < 12 HOURS Performed at [facility], [address] [place], [place] [address]    Report Status PENDING  Incomplete  CULTURE, BLOOD (ROUTINE X 2) w Reflex to ID Panel     Status: None (Preliminary result)   Collection Time: 07/09/2020  6:07 PM   Specimen: BLOOD  Result Value Ref Range Status   Specimen Description BLOOD LEFT THUMB  Final   Special Requests   Final    BOTTLES DRAWN AEROBIC AND ANAEROBIC Blood Culture adequate volume   Culture   Final    NO GROWTH < 12 HOURS Performed at [facility], [address], [place], [place] [address]    Report Status PENDING  Incomplete    Anti-infectives:  Anti-infectives (From admission, onward)   Start     Dose/Rate Route Frequency Ordered Stop   06/29/2020 2200  ceFAZolin (ANCEF) IVPB 2g/100 mL premix        2 g 200 mL/hr over 30 Minutes Intravenous Every 8 hours 07/09/2020 1630 07/02/20 0716   07/12/2020 0702  ceFAZolin (ANCEF) 2-4  GM/100ML-% IVPB       Note to Pharmacy: Register, Santiago Glad   : cabinet override      06/30/2020 0702 07/13/2020 0916   07/10/2020 0600  ceFAZolin (ANCEF) IVPB 2g/100 mL premix        2 g 200 mL/hr over 30 Minutes Intravenous On call to O.R. 06/30/20 2354 06/19/2020 1340       PAST MEDICAL HISTORY   Past Medical History:  Diagnosis Date  . CAD (coronary artery disease)   . Diabetes mellitus without complication ([place])   . Elevated liver enzymes   . History of kidney stones   . Hyperlipidemia   . MI  (myocardial infarction) ([place]) 07/01/12  . PAD (peripheral artery disease) ([place])      SURGICAL HISTORY   Past Surgical History:  Procedure Laterality Date  . CORONARY STENT PLACEMENT    . EYE SURGERY    . LOWER EXTREMITY ANGIOGRAPHY Left 06/25/2020   Procedure: LOWER EXTREMITY ANGIOGRAPHY;  Surgeon: Algernon Huxley, MD;  Location: [facility];  Service: Cardiovascular;  Laterality: Left;     FAMILY HISTORY   Family History  Problem Relation Age of Onset  . Diabetes Mother   . Heart attack Father      SOCIAL HISTORY   Social History   Tobacco Use  . Smoking status: Former Smoker    Types: Cigarettes    Quit date: 01/15/2004    Years since quitting: 16.4  . Smokeless tobacco: Never Used  Vaping Use  . Vaping Use: Never used  Substance Use Topics  . Alcohol use: Yes  . Drug use: No     MEDICATIONS   Current Medication:  Current Facility-Administered Medications:  .  0.9 %  sodium chloride infusion (Manually program via Guardrails IV Fluids), , Intravenous, Once, Blakeney, Dana G, NP .  0.9 %  sodium chloride infusion, , Intravenous, Continuous, Dew, Erskine Squibb, MD, Last Rate: 10 mL/hr at 06/29/2020 0809, New Bag at 06/22/2020 1435 .  0.9 %  sodium chloride infusion, 500 mL, Intravenous, Once PRN, Lucky Cowboy, Erskine Squibb, MD .  acetaminophen (TYLENOL) tablet 325-650 mg, 325-650 mg, Oral, Q4H PRN, 650 mg at 07/02/20 0353 **OR** acetaminophen (TYLENOL) suppository 325-650 mg, 325-650 mg, Rectal, Q4H PRN, Lucky Cowboy, Erskine Squibb, MD .  alum & mag hydroxide-simeth (MAALOX/MYLANTA) 200-200-20 MG/5ML suspension 15-30 mL, 15-30 mL, Oral, Q2H PRN, Lucky Cowboy, Erskine Squibb, MD .  amLODipine (NORVASC) tablet 10 mg, 10 mg, Oral, Daily, Dew, Erskine Squibb, MD .  anticoagulant sodium citrate solution 5 mL, 5 mL, Intracatheter, PRN, Awilda Bill, NP .  aspirin EC tablet 81 mg, 81 mg, Oral, Q0600, Algernon Huxley, MD .  atorvastatin (LIPITOR) tablet 40 mg, 40 mg, Oral, Daily, Dew, Erskine Squibb, MD .  benazepril (LOTENSIN)  tablet 40 mg, 40 mg, Oral, Daily, Dew, Erskine Squibb, MD .  chlorhexidine gluconate (MEDLINE KIT) (PERIDEX) 0.12 % solution 15 mL, 15 mL, Mouth Rinse, BID, Swathi Dauphin, MD, 15 mL at 07/02/20 0702 .  6 CHG cloth bath night before surgery, , , Once **AND** 6 CHG cloth bath AM of surgery, , , Once **AND** [COMPLETED] Chlorhexidine Gluconate Cloth 2 % PADS 6 each, 6 each, Topical, Once, 6 each at 07/06/2020 0730 **AND** Chlorhexidine Gluconate Cloth 2 % PADS 6 each, 6 each, Topical, Once, Dew, Erskine Squibb, MD .  collagenase (SANTYL) ointment 1 application, 1 application, Topical, Daily, Dew, Erskine Squibb, MD .  docusate sodium (COLACE) capsule 100 mg, 100 mg, Oral, Daily, Dew,  Erskine Squibb, MD .  EPINEPHrine (ADRENALIN) 4 mg in NS 250 mL (0.016 mg/mL) premix infusion, 0.5-20 mcg/min, Intravenous, Titrated, Awilda Bill, NP, Stopped at 07/02/20 0249 .  esmolol (BREVIBLOC) 2000 mg / 100 mL (20 mg/mL) infusion, 25-300 mcg/kg/min, Intravenous, Titrated, Dew, Erskine Squibb, MD .  famotidine (PEPCID) IVPB 20 mg premix, 20 mg, Intravenous, Q12H, Dew, Erskine Squibb, MD, Stopped at 07/02/20 0025 .  fentaNYL 2578mg in NS 2560m(1060mml) infusion-PREMIX, 0-400 mcg/hr, Intravenous, Continuous, Dealva Lafoy, MD, Last Rate: 30 mL/hr at 07/02/20 0400, 300 mcg/hr at 07/02/20 0400 .  furosemide (LASIX) injection 20 mg, 20 mg, Intravenous, Once, Dew, JasErskine SquibbD .  gabapentin (NEURONTIN) capsule 300 mg, 300 mg, Oral, TID, DewAlgernon HuxleyD, 300 mg at 06/22/2020 2125 .  guaiFENesin-dextromethorphan (ROBITUSSIN DM) 100-10 MG/5ML syrup 15 mL, 15 mL, Oral, Q4H PRN, Dew, JasErskine SquibbD .  hydrALAZINE (APRESOLINE) injection 5 mg, 5 mg, Intravenous, Q20 Min PRN, Dew, JasErskine SquibbD .  hydrochlorothiazide (HYDRODIURIL) tablet 25 mg, 25 mg, Oral, Daily, Dew, JasErskine SquibbD .  hydrocortisone sodium succinate (SOLU-CORTEF) 100 MG injection 50 mg, 50 mg, Intravenous, Q6H, Blakeney, Dana G, NP, 50 mg at 07/02/20 0800 .  HYDROmorphone (DILAUDID) injection 1 mg, 1 mg,  Intravenous, Once PRN, Dew, JasErskine SquibbD .  insulin aspart (novoLOG) injection 0-9 Units, 0-9 Units, Subcutaneous, Q4H, BlaAwilda BillP, 3 Units at 07/02/20 0800 .  labetalol (NORMODYNE) injection 10 mg, 10 mg, Intravenous, Q10 min PRN, Dew, JasErskine SquibbD .  magnesium sulfate IVPB 2 g 50 mL, 2 g, Intravenous, Daily PRN, DewAlgernon HuxleyD .  MEDLINE mouth rinse, 15 mL, Mouth Rinse, 10 times per day, AleOttie GlazierD, 15 mL at 07/02/20 0505 .  metoprolol tartrate (LOPRESSOR) injection 2-5 mg, 2-5 mg, Intravenous, Q2H PRN, Dew, JasErskine SquibbD .  metoprolol tartrate (LOPRESSOR) tablet 50 mg, 50 mg, Oral, BID, Dew, JasErskine SquibbD .  midazolam (VERSED) injection 2 mg, 2 mg, Intravenous, Q2H PRN, BlaAwilda BillP, 2 mg at 07/02/20 0726 .  morphine 2 MG/ML injection 2-5 mg, 2-5 mg, Intravenous, Q1H PRN, DewLucky CowboyasErskine SquibbD .  nitroGLYCERIN 50 mg in dextrose 5 % 250 mL (0.2 mg/mL) infusion, 5-250 mcg/min, Intravenous, Titrated, Dew, JasErskine SquibbD .  norepinephrine (LEVOPHED) 16 mg in 250m46memix infusion, 0-50 mcg/min, Intravenous, Titrated, BlakAwilda Bill, Last Rate: 37.5 mL/hr at 07/02/20 0421, 40 mcg/min at 07/02/20 0421 .  ondansetron (ZOFRAN) injection 4 mg, 4 mg, Intravenous, Q6H PRN, Dew,Lucky CowboysoErskine Squibb .  ondansetron (ZOFRAN) injection 4 mg, 4 mg, Intravenous, Q6H PRN, Dew,Lucky CowboysoErskine Squibb .  oxyCODONE-acetaminophen (PERCOCET/ROXICET) 5-325 MG per tablet 1-2 tablet, 1-2 tablet, Oral, Q4H PRN, Dew,Lucky CowboysoErskine Squibb .  phenol (CHLORASEPTIC) mouth spray 1 spray, 1 spray, Mouth/Throat, PRN, Dew, JasoErskine Squibb .  phenylephrine CONCENTRATED 100mg45msodium chloride 0.9% 250mL 77mmg/mL84mnfusion, 0-400 mcg/min, Intravenous, Titrated, BlakeneAwilda Billast Rate: 56.3 mL/hr at 07/02/20 0421, 375 mcg/min at 07/02/20 0421 .  potassium chloride (KLOR-CON) packet 40 mEq, 40 mEq, Per Tube, BID, BlakeneAwilda Bill0 mEq at 07/02/20 0013 .  potassium chloride SA (KLOR-CON) CR tablet 20-40 mEq, 20-40 mEq, Oral, Daily  PRN, Dew, JaLucky Cowboy SErskine Squibb senna-docusate (Senokot-S) tablet 1 tablet, 1 tablet, Oral, QHS PRN, Dew, JaLucky Cowboy SErskine Squibb sodium bicarbonate 150 mEq in dextrose 5 % 1,000 mL infusion, , Intravenous, Continuous, Kolluru, Sarath, MD .  sorbitol 70 % solution 30 mL, 30 mL, Oral, Daily PRN, Algernon Huxley, MD .  tamsulosin (FLOMAX) capsule 0.4 mg, 0.4 mg, Oral, QHS, Dew, Erskine Squibb, MD, 0.4 mg at 06/19/2020 2125 .  vasopressin (PITRESSIN) 20 Units in sodium chloride 0.9 % 100 mL infusion-*FOR SHOCK*, 0-0.05 Units/min, Intravenous, Continuous, Awilda Bill, NP, Last Rate: 15 mL/hr at 07/02/20 0800, 0.05 Units/min at 07/02/20 0800    ALLERGIES   Iodinated diagnostic agents, Iodine, Shellfish allergy, and Shellfish-derived products    REVIEW OF SYSTEMS    Unable to obtain due to mechanical ventilation and sedation  PHYSICAL EXAMINATION   Vital Signs: Temp:  [94.1 F (34.5 C)-100.6 F (38.1 C)] 100.6 F (38.1 C) (02/17 0700) Pulse Rate:  [85-121] 88 (02/17 0700) Resp:  [10-21] 18 (02/17 0700) BP: (69-132)/(37-81) 117/60 (02/17 0700) SpO2:  [100 %] 100 % (02/17 0700) Arterial Line BP: (46-131)/(33-57) 109/44 (02/17 0700) FiO2 (%):  [35 %-50 %] 35 % (02/17 0200) Weight:  [66.2 kg] 66.2 kg (02/16 1630)  GENERAL:Age appropriate HEAD: Normocephalic, atraumatic.  EYES: Pupils equal, round, reactive to light.  No scleral icterus.  MOUTH: Moist mucosal membrane. NECK: Supple. No thyromegaly. No nodules. No JVD.  PULMONARY: MV sounds bilaterally  CARDIOVASCULAR: S1 and S2. Regular rate and rhythm. No murmurs, rubs, or gallops.  GASTROINTESTINAL: Soft, nontender, non-distended. No masses. Positive bowel sounds. No hepatosplenomegaly.  MUSCULOSKELETAL: No swelling, clubbing, or edema.  NEUROLOGIC: GCS4T SKIN:intact,warm,dry   PERTINENT DATA     Infusions: . sodium chloride 10 mL/hr at 07/11/2020 0809  . sodium chloride    . anticoagulant sodium citrate    . epinephrine Stopped (07/02/20 0249)   . esmolol    . famotidine (PEPCID) IV Stopped (07/02/20 0025)  . fentaNYL infusion INTRAVENOUS 300 mcg/hr (07/02/20 0400)  . magnesium sulfate bolus IVPB    . nitroGLYCERIN    . norepinephrine (LEVOPHED) Adult infusion 40 mcg/min (07/02/20 0421)  . phenylephrine (NEO-SYNEPHRINE) Adult infusion 375 mcg/min (07/02/20 0421)  . sodium bicarbonate (isotonic) 150 mEq in D5W 1000 mL infusion    . vasopressin 0.05 Units/min (07/02/20 0800)   Scheduled Medications: . sodium chloride   Intravenous Once  . amLODipine  10 mg Oral Daily  . aspirin EC  81 mg Oral Q0600  . atorvastatin  40 mg Oral Daily  . benazepril  40 mg Oral Daily  . chlorhexidine gluconate (MEDLINE KIT)  15 mL Mouth Rinse BID  . Chlorhexidine Gluconate Cloth  6 each Topical Once  . collagenase  1 application Topical Daily  . docusate sodium  100 mg Oral Daily  . furosemide  20 mg Intravenous Once  . gabapentin  300 mg Oral TID  . hydrochlorothiazide  25 mg Oral Daily  . hydrocortisone sod succinate (SOLU-CORTEF) inj  50 mg Intravenous Q6H  . insulin aspart  0-9 Units Subcutaneous Q4H  . mouth rinse  15 mL Mouth Rinse 10 times per day  . metoprolol tartrate  50 mg Oral BID  . potassium chloride  40 mEq Per Tube BID  . tamsulosin  0.4 mg Oral QHS   PRN Medications: sodium chloride, acetaminophen **OR** acetaminophen, alum & mag hydroxide-simeth, anticoagulant sodium citrate, guaiFENesin-dextromethorphan, hydrALAZINE, HYDROmorphone (DILAUDID) injection, labetalol, magnesium sulfate bolus IVPB, metoprolol tartrate, midazolam, morphine injection, ondansetron (ZOFRAN) IV, ondansetron, oxyCODONE-acetaminophen, phenol, potassium chloride, senna-docusate, sorbitol Hemodynamic parameters:   Intake/Output: 02/16 0701 - 02/17 0700 In: 11537.9 [I.V.:5735.9; HCWCB:7628; IV BTDVVOHYW:7371] Out: 0626 [Urine:4050; Blood:2000]  Ventilator  Settings: Vent Mode: PRVC FiO2 (%):  [  35 %-50 %] 35 % Set Rate:  [18 bmp] 18 bmp Vt Set:  [500  mL] 500 mL PEEP:  [5 cmH20] 5 cmH20    LAB RESULTS:  Basic Metabolic Panel: Recent Labs  Lab 06/25/20 1034 07/11/2020 0741 06/27/2020 0741 07/06/2020 2020 07/02/20 0346  NA  --  140  --  146* 150*  K  --  3.8   < > 3.4* 3.4*  CL  --  100  --  108 105  CO2  --  26  --  14* 12*  GLUCOSE  --  176*  --  219* 234*  BUN 24* 21  --  18 22  CREATININE 0.90 0.91  --  1.45* 1.87*  CALCIUM  --  9.7  --  6.4* 6.6*  MG  --   --   --   --  1.4*  PHOS  --   --   --   --  9.1*   < > = values in this interval not displayed.   Liver Function Tests: Recent Labs  Lab 07/02/20 0346 07/02/20 0753  AST 554* 849*  ALT 524* 777*  ALKPHOS 42 46  BILITOT 0.8 0.6  PROT 4.0* 3.9*  ALBUMIN 2.6* 2.4*   No results for input(s): LIPASE, AMYLASE in the last 168 hours. No results for input(s): AMMONIA in the last 168 hours. CBC: Recent Labs  Lab 06/26/2020 0741 06/20/2020 1507 06/25/2020 1807 06/27/2020 2112 07/02/20 0145 07/02/20 0753  WBC 13.3* 21.9* 24.6*  --  27.3*  --   NEUTROABS 9.5*  --   --   --   --   --   HGB 13.7 8.3* 6.6* 6.1* 11.9* 11.5*  HCT 41.1 24.7* 20.5* 19.2* 37.1* 34.1*  MCV 85.6 87.6 91.5  --  82.6  --   PLT 290 216 246  --  158  --    Cardiac Enzymes: Recent Labs  Lab 07/02/20 0346 07/02/20 0753  CKTOTAL 114 178   BNP: Invalid input(s): POCBNP CBG: Recent Labs  Lab 06/25/20 1030 06/26/2020 0756 07/13/2020 1619 07/02/20 0344 07/02/20 0731  GLUCAP 146* 170* 207* 213* 244*       IMAGING RESULTS:  Imaging: DG Chest Port 1 View  Result Date: 07/02/2020 CLINICAL DATA:  Encounter for central line placement EXAM: PORTABLE CHEST 1 VIEW COMPARISON:  07/13/2020 FINDINGS: Normal heart size and mediastinal contours. Mild indistinct opacity at the bases. Lung volumes are low. Bilateral central line with new left-sided line tip at the upper SVC. No pneumothorax. The enteric tube loops at the stomach. The endotracheal tube tip is just below the clavicular heads. IMPRESSION: 1.  New left IJ line without complicating feature. 2. Mild atelectatic opacity at the bases. Electronically Signed   By: Monte Fantasia M.D.   On: 07/02/2020 06:32   DG Chest Port 1 View  Result Date: 06/20/2020 CLINICAL DATA:  Central line placement, postop EXAM: PORTABLE CHEST 1 VIEW COMPARISON:  06/25/2020 FINDINGS: Single frontal view of the chest demonstrates endotracheal tube overlying tracheal air column tip at level of thoracic inlet. Enteric catheter passes below diaphragm tip and side port projecting over gastric fundus. Esophageal temperature probe overlies the lower thoracic esophagus. Right internal jugular central venous catheter overlies superior vena cava. Cardiac silhouette is unremarkable. No airspace disease, effusion, or pneumothorax. No acute bony abnormalities. IMPRESSION: 1. Support devices as above.  No evidence of malpositioning. 2. No acute intrathoracic process. Electronically Signed   By: Randa Ngo M.D.   On: 06/24/2020  20:25   DG C-Arm 1-60 Min-No Report  Result Date: 07/03/2020 Fluoroscopy was utilized by the requesting physician.  No radiographic interpretation.   _0 @ DG Chest Port 1 View  Result Date: 07/02/2020 CLINICAL DATA:  Encounter for central line placement EXAM: PORTABLE CHEST 1 VIEW COMPARISON:  06/24/2020 FINDINGS: Normal heart size and mediastinal contours. Mild indistinct opacity at the bases. Lung volumes are low. Bilateral central line with new left-sided line tip at the upper SVC. No pneumothorax. The enteric tube loops at the stomach. The endotracheal tube tip is just below the clavicular heads. IMPRESSION: 1. New left IJ line without complicating feature. 2. Mild atelectatic opacity at the bases. Electronically Signed   By: Monte Fantasia M.D.   On: 07/02/2020 06:32   DG Chest Port 1 View  Result Date: 06/29/2020 CLINICAL DATA:  Central line placement, postop EXAM: PORTABLE CHEST 1 VIEW COMPARISON:  06/25/2020 FINDINGS: Single frontal view  of the chest demonstrates endotracheal tube overlying tracheal air column tip at level of thoracic inlet. Enteric catheter passes below diaphragm tip and side port projecting over gastric fundus. Esophageal temperature probe overlies the lower thoracic esophagus. Right internal jugular central venous catheter overlies superior vena cava. Cardiac silhouette is unremarkable. No airspace disease, effusion, or pneumothorax. No acute bony abnormalities. IMPRESSION: 1. Support devices as above.  No evidence of malpositioning. 2. No acute intrathoracic process. Electronically Signed   By: Randa Ngo M.D.   On: 06/21/2020 20:25   DG C-Arm 1-60 Min-No Report  Result Date: 06/30/2020 Fluoroscopy was utilized by the requesting physician.  No radiographic interpretation.     ASSESSMENT AND PLAN    -Multidisciplinary rounds held today  Acute Circulatory shock   Presumably due to some blood loss and sedation, will continue IVF, repeat CXR.    - dont think this is transfusion associated lung injury since patient received his own blood periop    -A-line monitoring in process     - blood cultures pending to confirm absence of bacteremia   Severe peripheral vascular diseae S/p surgery as below 1.    Aortobifemoral bypass grafting with 14 x 7 bifurcated dacryon graft 2. Right renal endarterectomy 3. Left renal endarterectomy 4.   Right common femoral endarterectomy 5.   Right superficial femoral endarterectomy 6.   Left common femoral endarterectomy 7.   Left superficial femoral endarterectomy 8.   Catheter placement into the aorta by Dr. Delana Meyer right groin approach. 9.   Catheter placement into the aorta and subsequently catheter placement into the popliteal by Dr. Lucky Cowboy left groin approach    ID -continue IV abx as prescibed -follow up cultures  GI/Nutrition GI PROPHYLAXIS as indicated DIET-->TF's as tolerated Constipation protocol as indicated  ENDO - ICU hypoglycemic\Hyperglycemia  protocol -check FSBS per protocol   ELECTROLYTES -follow labs as needed -replace as needed -pharmacy consultation   DVT/GI PRX ordered -SCDs  TRANSFUSIONS AS NEEDED MONITOR FSBS ASSESS the need for LABS as needed   Critical care provider statement:    Critical care time (minutes):  33   Critical care time was exclusive of:  Separately billable procedures and treating other patients   Critical care was necessary to treat or prevent imminent or life-threatening deterioration of the following conditions:  circulatory shock, severe pad.   Critical care was time spent personally by me on the following activities:  Development of treatment plan with patient or surrogate, discussions with consultants, evaluation of patient's response to treatment, examination of patient, obtaining history from patient  or surrogate, ordering and performing treatments and interventions, ordering and review of laboratory studies and re-evaluation of patient's condition.  I assumed direction of critical care for this patient from another provider in my specialty: no    This document was prepared using Dragon voice recognition software and may include unintentional dictation errors.    Ottie Glazier, M.D.  Division of [facility]

## 2020-07-02 NOTE — Progress Notes (Addendum)
Initial Nutrition Assessment  DOCUMENTATION CODES:   Not applicable  INTERVENTION:  Once patient stable enough for initiation of tube feeds recommend: -Initiate Vital 1.5 Cal at 15 mL/hr and advance by 15 mL/hr every 8 hours to goal rate of 45 mL/hr (1080 mL goal daily volume) -Provide PROSource TF 45 mL TID per tube -Goal regimen provides 1740 kcal, 106 grams of protein, 821 mL H2O daily  NUTRITION DIAGNOSIS:   Inadequate oral intake related to inability to eat as evidenced by NPO status.  GOAL:   Patient will meet greater than or equal to 90% of their needs  MONITOR:   Vent status,Labs,Weight trends,I & O's,Skin  REASON FOR ASSESSMENT:   Ventilator    ASSESSMENT:   65 year old male with PMHx of CAD, PAD, DM, HLD, MI s/p aortobifemoral bypass, bilateral renal endarterectomies, aortic endarterectomy, and bilateral femoral endarterectomies on 2/16, intraoperatively lost approximately 2 liters of blood with 900 mL returned via blood saver and developed circulatory shock.   2/16 intubated  Patient is currently intubated on ventilator support MV: 9.4 L/min Temp (24hrs), Avg:98 F (36.7 C), Min:94.1 F (34.5 C), Max:100.76 F (38.2 C)  Medications reviewed and include: Colace 100 mg daily, Solu-Cortef 50 mg Q6hrs IV, Novolog 0-9 units Q4hrs, potassium chloride 40 mEq BID per tube, famotidine, fentanyl gtt, norepinephrine gtt at 40 mcg/min, phenylephrine gtt at 365 mcg/min, sodium bicarbonate 150 mEq in D5W at 150 mL/hr, vasopressin gtt 0.05 units/min.  Labs reviewed: CBG 244-265, Sodium 150, Potassium 3.4, CO2 12, Creatinine 1.87, Phosphorus 9.1, Magnesium 1.4.  I/O: 4050 mL UOP yesterday (2.5 mL/kg/hr)  Enteral Access: 16 Fr. NGT placed 2/16; loops in stomach per chest x-ray 2/17  Discussed on rounds. Patient s/p infusion of pRBC, platelets, and fresh frozen plasma. Was also given multiple liters of fluid.  NUTRITION - FOCUSED PHYSICAL EXAM:  Flowsheet Row Most  Recent Value  Orbital Region No depletion  Upper Arm Region No depletion  Thoracic and Lumbar Region No depletion  Buccal Region Unable to assess  Temple Region No depletion  Clavicle Bone Region No depletion  Clavicle and Acromion Bone Region No depletion  Scapular Bone Region Unable to assess  Dorsal Hand No depletion  Patellar Region No depletion  Anterior Thigh Region No depletion  Posterior Calf Region No depletion  Edema (RD Assessment) None  Hair Reviewed  Eyes Unable to assess  Mouth Unable to assess  Skin Reviewed  Nails Reviewed     Diet Order:   Diet Order            Diet NPO time specified  Diet effective now                EDUCATION NEEDS:   No education needs have been identified at this time  Skin:  Skin Assessment: Skin Integrity Issues: Skin Integrity Issues:: Unstageable,Incisions Unstageable: left heel Incisions: closed incisions to groin and abdomen  Last BM:  Unknown  Height:   Ht Readings from Last 1 Encounters:  07/10/20 5\' 5"  (1.651 m)   Weight:  Wt Readings from Last 1 Encounters:  2020/07/10 66.2 kg   BMI:  Body mass index is 24.29 kg/m.  Estimated Nutritional Needs:   Kcal:  1782 (PSU 2003b)  Protein:  100-110 grams  Fluid:  2 L/day  10-26-1990, MS, RD, LDN Pager number available on [employer]

## 2020-07-02 NOTE — Consult Note (Signed)
[facility]  CONSULT NOTE    Date: 07/02/2020                  Patient Name:  Ivan Larsen  MRN: [id number]  DOB: [birth year]  Age / Sex: 65 y.o., male         PCP: Venita Lick, NP                 Service Requesting Consult: Dr. Lucky Cowboy                 Reason for Consult: Acute kidney injury            History of Present Illness: Ivan Larsen came for scheduled aortobifemoral bypass with Dr. Delana Meyer and Dr. Lucky Cowboy yesterday. Patients did undergo bilateral renal endarterectomies.  Nephrology consulted for worsening kidney function, metabolic acidosis and hypernatremia. Dialysis catheter placed   Medications: Outpatient medications: Medications Prior to Admission  Medication Sig Dispense Refill Last Dose  . amLODipine (NORVASC) 10 MG tablet Take 1 tablet (10 mg total) by mouth daily. 90 tablet 1 06/18/2020 at Unknown time  . atorvastatin (LIPITOR) 40 MG tablet Take 1 tablet (40 mg total) by mouth daily. 90 tablet 4 06/30/2020 at Unknown time  . clopidogrel (PLAVIX) 75 MG tablet Take 1 tablet (75 mg total) by mouth daily. 90 tablet 1 06/30/2020 at Unknown time  . collagenase (SANTYL) ointment Apply 1 application topically daily. 15 g 0 06/30/2020 at Unknown time  . empagliflozin (JARDIANCE) 25 MG TABS tablet Take 1 tablet (25 mg total) by mouth daily before breakfast. 90 tablet 1 07/10/2020 at Unknown time  . gabapentin (NEURONTIN) 300 MG capsule Take 1 capsule (300 mg total) by mouth 3 (three) times daily. 270 capsule 4 07/05/2020 at Unknown time  . hydrochlorothiazide (HYDRODIURIL) 25 MG tablet Take 1 tablet (25 mg total) by mouth daily. 90 tablet 1 06/24/2020 at Unknown time  . HYDROcodone-acetaminophen (NORCO) 5-325 MG tablet Take 1 tablet by mouth every 6 (six) hours as needed for moderate pain. 30 tablet 0 06/30/2020 at Unknown time  . meclizine (ANTIVERT) 25 MG tablet Take 1 tablet (25 mg total) by mouth 3 (three) times daily as needed for dizziness  or nausea. 30 tablet 1 06/30/2020 at Unknown time  . metoprolol tartrate (LOPRESSOR) 50 MG tablet Take 1 tablet (50 mg total) by mouth 2 (two) times daily. 180 tablet 1 06/19/2020 at Unknown time  . mupirocin ointment (BACTROBAN) 2 % Apply 1 application topically 2 (two) times daily. 22 g 1 06/30/2020 at Unknown time  . Omega-3 Fatty Acids (FISH OIL OMEGA-3) 1000 MG CAPS Take by mouth 2 (two) times daily.   06/30/2020 at Unknown time  . ondansetron (ZOFRAN-ODT) 4 MG disintegrating tablet 1 TABLET ORALLY DISSOLVED EVERY 8 HOURS IF NEEDED FOR NAUSEA AND VOMITING 21 tablet 0 Past Month at Unknown time  . sitaGLIPtin (JANUVIA) 25 MG tablet Take 1 tablet (25 mg total) by mouth daily. 90 tablet 1 06/30/2020 at Unknown time  . tamsulosin (FLOMAX) 0.4 MG CAPS capsule Take 1 capsule (0.4 mg total) by mouth at bedtime. Take at nighttime to minimize dizziness/hypotension 90 capsule 1 06/30/2020 at Unknown time  . benazepril (LOTENSIN) 40 MG tablet Take 1 tablet (40 mg total) by mouth daily. 90 tablet 1   . doxycycline (VIBRA-TABS) 100 MG tablet Take 1 tablet (100 mg total) by mouth 2 (two) times daily. (Patient not taking: No sig reported) 20 tablet 0 Not Taking at  Unknown time  . metFORMIN (GLUCOPHAGE) 500 MG tablet Take 2 tablets (1,000 mg total) by mouth 2 (two) times daily with a meal. 360 tablet 1 06/29/2020    Current medications: Current Facility-Administered Medications  Medication Dose Route Frequency Provider Last Rate Last Admin  . 0.9 %  sodium chloride infusion (Manually program via Guardrails IV Fluids)   Intravenous Once Awilda Bill, NP      . 0.9 %  sodium chloride infusion   Intravenous Continuous Algernon Huxley, MD 10 mL/hr at 06/30/2020 0809 New Bag at 07/11/2020 1435  . 0.9 %  sodium chloride infusion  500 mL Intravenous Once PRN Algernon Huxley, MD      . acetaminophen (TYLENOL) tablet 325-650 mg  325-650 mg Oral Q4H PRN Algernon Huxley, MD   650 mg at 07/02/20 0353   Or  . acetaminophen (TYLENOL)  suppository 325-650 mg  325-650 mg Rectal Q4H PRN Algernon Huxley, MD      . alum & mag hydroxide-simeth (MAALOX/MYLANTA) 200-200-20 MG/5ML suspension 15-30 mL  15-30 mL Oral Q2H PRN Algernon Huxley, MD      . anticoagulant sodium citrate solution 5 mL  5 mL Intracatheter PRN Awilda Bill, NP      . aspirin EC tablet 81 mg  81 mg Oral Q0600 Algernon Huxley, MD      . atorvastatin (LIPITOR) tablet 40 mg  40 mg Oral Daily Algernon Huxley, MD   40 mg at 07/02/20 1141  . chlorhexidine gluconate (MEDLINE KIT) (PERIDEX) 0.12 % solution 15 mL  15 mL Mouth Rinse BID Ottie Glazier, MD   15 mL at 07/02/20 0702  . Chlorhexidine Gluconate Cloth 2 % PADS 6 each  6 each Topical Once Dew, Erskine Squibb, MD      . collagenase (SANTYL) ointment 1 application  1 application Topical Daily Algernon Huxley, MD   1 application at 73/22/02 [phone]  . docusate sodium (COLACE) capsule 100 mg  100 mg Oral Daily Algernon Huxley, MD      . esmolol (BREVIBLOC) 2000 mg / 100 mL (20 mg/mL) infusion  25-300 mcg/kg/min Intravenous Titrated Algernon Huxley, MD      . famotidine (PEPCID) IVPB 20 mg premix  20 mg Intravenous Q12H Algernon Huxley, MD   Stopped at 07/02/20 0025  . fentaNYL 2526mg in NS 2537m(1035mml) infusion-PREMIX  0-400 mcg/hr Intravenous Continuous AleOttie GlazierD 30 mL/hr at 07/02/20 0400 300 mcg/hr at 07/02/20 0400  . gabapentin (NEURONTIN) capsule 300 mg  300 mg Oral TID DewAlgernon HuxleyD   300 mg at 07/02/20 1140  . guaiFENesin-dextromethorphan (ROBITUSSIN DM) 100-10 MG/5ML syrup 15 mL  15 mL Oral Q4H PRN DewAlgernon HuxleyD      . hydrALAZINE (APRESOLINE) injection 5 mg  5 mg Intravenous Q20 Min PRN DewAlgernon HuxleyD      . hydrocortisone sodium succinate (SOLU-CORTEF) 100 MG injection 50 mg  50 mg Intravenous Q6H BlaAwilda BillP   50 mg at 07/02/20 0800  . HYDROmorphone (DILAUDID) injection 1 mg  1 mg Intravenous Once PRN DewAlgernon HuxleyD      . insulin aspart (novoLOG) injection 0-9 Units  0-9 Units Subcutaneous Q4H BlaAwilda BillP   5 Units at 07/02/20 1141  . labetalol (NORMODYNE) injection 10 mg  10 mg Intravenous Q10 min PRN DewAlgernon HuxleyD      . magnesium sulfate IVPB 2 g  50 mL  2 g Intravenous Daily PRN Algernon Huxley, MD      . MEDLINE mouth rinse  15 mL Mouth Rinse 10 times per day Ottie Glazier, MD   15 mL at 07/02/20 1143  . metoprolol tartrate (LOPRESSOR) injection 2-5 mg  2-5 mg Intravenous Q2H PRN Algernon Huxley, MD      . midazolam (VERSED) injection 2 mg  2 mg Intravenous Q2H PRN Awilda Bill, NP   2 mg at 07/02/20 0726  . morphine 2 MG/ML injection 2-5 mg  2-5 mg Intravenous Q1H PRN Algernon Huxley, MD      . nitroGLYCERIN 50 mg in dextrose 5 % 250 mL (0.2 mg/mL) infusion  5-250 mcg/min Intravenous Titrated Algernon Huxley, MD      . norepinephrine (LEVOPHED) 16 mg in 267m premix infusion  0-50 mcg/min Intravenous Titrated BAwilda Bill NP 37.5 mL/hr at 07/02/20 0421 40 mcg/min at 07/02/20 0421  . ondansetron (ZOFRAN) injection 4 mg  4 mg Intravenous Q6H PRN DAlgernon Huxley MD      . ondansetron (ZOFRAN) injection 4 mg  4 mg Intravenous Q6H PRN DAlgernon Huxley MD      . oxyCODONE-acetaminophen (PERCOCET/ROXICET) 5-325 MG per tablet 1-2 tablet  1-2 tablet Oral Q4H PRN DAlgernon Huxley MD      . phenol (CHLORASEPTIC) mouth spray 1 spray  1 spray Mouth/Throat PRN DAlgernon Huxley MD      . phenylephrine CONCENTRATED 1015min sodium chloride 0.9% 2503m0.4mg80m) infusion  0-400 mcg/min Intravenous Titrated BlakAwilda Bill 54.8 mL/hr at 07/02/20 1105 365 mcg/min at 07/02/20 1105  . potassium chloride (KLOR-CON) packet 40 mEq  40 mEq Per Tube BID BlakAwilda Bill   40 mEq at 07/02/20 1140  . potassium chloride SA (KLOR-CON) CR tablet 20-40 mEq  20-40 mEq Oral Daily PRN Dew,Algernon Huxley      . senna-docusate (Senokot-S) tablet 1 tablet  1 tablet Oral QHS PRN Dew,Algernon Huxley      . sodium bicarbonate 150 mEq in dextrose 5 % 1,000 mL infusion   Intravenous Continuous Joselinne Lawal, MD 150 mL/hr at  07/02/20 0934 New Bag at 07/02/20 0934  . sorbitol 70 % solution 30 mL  30 mL Oral Daily PRN Dew,Algernon Huxley      . tamsulosin (FLOMAX) capsule 0.4 mg  0.4 mg Oral QHS Dew,Algernon Huxley   0.4 mg at 06/29/2020 2125  . vasopressin (PITRESSIN) 20 Units in sodium chloride 0.9 % 100 mL infusion-*FOR SHOCK*  0-0.05 Units/min Intravenous Continuous BlakAwilda Bill 15 mL/hr at 07/02/20 0800 0.05 Units/min at 07/02/20 0800      Allergies: Allergies  Allergen Reactions  . Iodinated Diagnostic Agents Nausea Only    Other reaction(s): Vomiting  . Iodine   . Shellfish Allergy Nausea And Vomiting and Other (See Comments)    Sweating  Sweating    . Shellfish-Derived Products Nausea And Vomiting and Other (See Comments)    Other reaction(s): Nausea And Vomiting, Other (See Comments) Sweating  Sweating       Past Medical History: Past Medical History:  Diagnosis Date  . CAD (coronary artery disease)   . Diabetes mellitus without complication (HCC)[place]. Elevated liver enzymes   . History of kidney stones   . Hyperlipidemia   . MI (myocardial infarction) (HCC)[place]16/14  . PAD (peripheral artery disease) (HCC)      Past  Surgical History: Past Surgical History:  Procedure Laterality Date  . AORTA - BILATERAL FEMORAL ARTERY BYPASS GRAFT N/A 06/24/2020   Procedure: AORTA BIFEMORAL BYPASS GRAFT (VERSUS BILATERAL FEMORAL ENDARTERECTOMIES);  Surgeon: Algernon Huxley, MD;  Location: [facility] ORS;  Service: Vascular;  Laterality: N/A;  . CORONARY STENT PLACEMENT    . EYE SURGERY    . LOWER EXTREMITY ANGIOGRAPHY Left 06/25/2020   Procedure: LOWER EXTREMITY ANGIOGRAPHY;  Surgeon: Algernon Huxley, MD;  Location: [facility];  Service: Cardiovascular;  Laterality: Left;     Family History: Family History  Problem Relation Age of Onset  . Diabetes Mother   . Heart attack Father      Social History: Social History   Socioeconomic History  . Marital status: Married    Spouse name: Not on  file  . Number of children: Not on file  . Years of education: Not on file  . Highest education level: Not on file  Occupational History  . Not on file  Tobacco Use  . Smoking status: Former Smoker    Types: Cigarettes    Quit date: 01/15/2004    Years since quitting: 16.4  . Smokeless tobacco: Never Used  Vaping Use  . Vaping Use: Never used  Substance and Sexual Activity  . Alcohol use: Yes  . Drug use: No  . Sexual activity: Not on file  Other Topics Concern  . Not on file  Social History Narrative  . Not on file   Social Determinants of Health   Financial Resource Strain: Not on file  Food Insecurity: Not on file  Transportation Needs: Not on file  Physical Activity: Not on file  Stress: Not on file  Social Connections: Not on file  Intimate Partner Violence: Not on file     Review of Systems: Review of Systems  Unable to perform ROS: Critical illness    Vital Signs: Blood pressure 126/70, pulse 90, temperature 100.22 F (37.9 C), resp. rate 18, height 5' 5"  (1.651 m), weight 66.2 kg, SpO2 100 %.  Weight trends: Filed Weights   06/29/2020 0801 07/11/2020 1630  Weight: 63.5 kg 66.2 kg    Physical Exam: General: Critically ill  Head: Normocephalic, atraumatic. Moist oral mucosal membranes  Eyes: Anicteric, PERRL  Neck: Supple, trachea midline  Lungs:  Clear to auscultation  Heart: Regular rate and rhythm  Abdomen:  Soft, nontender,   Extremities: no peripheral edema.  Neurologic: Nonfocal, moving all four extremities  Skin: No lesions  Access: Right femoral temp HD catheter 2/17     Lab results: Basic Metabolic Panel: Recent Labs  Lab 06/19/2020 0741 06/30/2020 2020 07/02/20 0346  NA 140 146* 150*  K 3.8 3.4* 3.4*  CL 100 108 105  CO2 26 14* 12*  GLUCOSE 176* 219* 234*  BUN 21 18 22   CREATININE 0.91 1.45* 1.87*  CALCIUM 9.7 6.4* 6.6*  MG  --   --  1.4*  PHOS  --   --  9.1*    Liver Function Tests: Recent Labs  Lab 07/02/20 0346  07/02/20 0753  AST 554* 849*  ALT 524* 777*  ALKPHOS 42 46  BILITOT 0.8 0.6  PROT 4.0* 3.9*  ALBUMIN 2.6* 2.4*   No results for input(s): LIPASE, AMYLASE in the last 168 hours. No results for input(s): AMMONIA in the last 168 hours.  CBC: Recent Labs  Lab 06/17/2020 0741 06/21/2020 1507 06/26/2020 1807 06/20/2020 2112 07/02/20 0145 07/02/20 0753  WBC 13.3* 21.9* 24.6*  --  27.3*  --   NEUTROABS 9.5*  --   --   --   --   --   HGB 13.7 8.3* 6.6* 6.1* 11.9* 11.5*  HCT 41.1 24.7* 20.5* 19.2* 37.1* 34.1*  MCV 85.6 87.6 91.5  --  82.6  --   PLT 290 216 246  --  158  --     Cardiac Enzymes: Recent Labs  Lab 07/02/20 0346 07/02/20 0753  CKTOTAL 114 178    BNP: Invalid input(s): POCBNP  CBG: Recent Labs  Lab 07/11/2020 0756 06/22/2020 1619 07/02/20 0344 07/02/20 0731 07/02/20 1117  GLUCAP 170* 207* 213* 244* 265*    Microbiology: Results for orders placed or performed during the hospital encounter of 07/07/2020  MRSA PCR Screening     Status: None   Collection Time: 06/22/2020  4:32 PM   Specimen: Nasopharyngeal  Result Value Ref Range Status   MRSA by PCR NEGATIVE NEGATIVE Final    Comment:        The GeneXpert MRSA Assay (FDA approved for NASAL specimens only), is one component of a comprehensive MRSA colonization surveillance program. It is not intended to diagnose MRSA infection nor to guide or monitor treatment for MRSA infections. Performed at [facility], [address] [place], [place] [address]   CULTURE, BLOOD (ROUTINE X 2) w Reflex to ID Panel     Status: None (Preliminary result)   Collection Time: 07/03/2020  6:07 PM   Specimen: BLOOD  Result Value Ref Range Status   Specimen Description BLOOD BLOOD RIGHT HAND  Final   Special Requests   Final    BOTTLES DRAWN AEROBIC AND ANAEROBIC Blood Culture results may not be optimal due to an inadequate volume of blood received in culture bottles   Culture   Final    NO GROWTH < 12 HOURS Performed at  [facility], [address] [place], [place] [address]    Report Status PENDING  Incomplete  CULTURE, BLOOD (ROUTINE X 2) w Reflex to ID Panel     Status: None (Preliminary result)   Collection Time: 06/28/2020  6:07 PM   Specimen: BLOOD  Result Value Ref Range Status   Specimen Description BLOOD LEFT THUMB  Final   Special Requests   Final    BOTTLES DRAWN AEROBIC AND ANAEROBIC Blood Culture adequate volume   Culture   Final    NO GROWTH < 12 HOURS Performed at [facility], [address], [place],  [address]    Report Status PENDING  Incomplete    Coagulation Studies: Recent Labs    06/17/2020 0741  LABPROT 13.2  INR 1.0    Urinalysis: Recent Labs    07/02/20 0301  COLORURINE YELLOW*  LABSPEC 1.013  PHURINE 5.0  GLUCOSEU >=500*  HGBUR LARGE*  BILIRUBINUR NEGATIVE  KETONESUR 20*  PROTEINUR 30*  NITRITE NEGATIVE  LEUKOCYTESUR NEGATIVE      Imaging: US RENAL  Result Date: 07/02/2020 CLINICAL DATA:  Acute renal failure, history diabetes mellitus, coronary artery disease post MI, peripheral vascular disease, hypertension, hyperlipidemia CT, former smoker EXAM: RENAL / URINARY TRACT ULTRASOUND COMPLETE COMPARISON:  CT angio abdomen and pelvis 06/25/2020 FINDINGS: Right Kidney: Renal measurements: 12.6 x 6.1 x 6.2 cm = volume: 251 mL. Normal cortical thickness. Borderline increased cortical echogenicity. No mass or hydronephrosis. No shadowing calcification. Minimal perinephric fluid. Left Kidney: Renal measurements: 13.3 x 7.1 x 6.2 cm = volume: 307 mL. Normal cortical thickness. Borderline increased cortical echogenicity. No mass, hydronephrosis,  or shadowing calcification. Bladder: Decompressed by Foley catheter, unable to evaluate. Other: Minimal ascites.  Question fatty infiltration of liver. IMPRESSION: Question medical renal disease changes of both kidneys. No evidence of renal mass or hydronephrosis. Minimal ascites. Electronically Signed    By: Lavonia Dana M.D.   On: 07/02/2020 10:30   DG Chest Port 1 View  Result Date: 07/02/2020 CLINICAL DATA:  Encounter for central line placement EXAM: PORTABLE CHEST 1 VIEW COMPARISON:  06/27/2020 FINDINGS: Normal heart size and mediastinal contours. Mild indistinct opacity at the bases. Lung volumes are low. Bilateral central line with new left-sided line tip at the upper SVC. No pneumothorax. The enteric tube loops at the stomach. The endotracheal tube tip is just below the clavicular heads. IMPRESSION: 1. New left IJ line without complicating feature. 2. Mild atelectatic opacity at the bases. Electronically Signed   By: Monte Fantasia M.D.   On: 07/02/2020 06:32   DG Chest Port 1 View  Result Date: 06/23/2020 CLINICAL DATA:  Central line placement, postop EXAM: PORTABLE CHEST 1 VIEW COMPARISON:  06/25/2020 FINDINGS: Single frontal view of the chest demonstrates endotracheal tube overlying tracheal air column tip at level of thoracic inlet. Enteric catheter passes below diaphragm tip and side port projecting over gastric fundus. Esophageal temperature probe overlies the lower thoracic esophagus. Right internal jugular central venous catheter overlies superior vena cava. Cardiac silhouette is unremarkable. No airspace disease, effusion, or pneumothorax. No acute bony abnormalities. IMPRESSION: 1. Support devices as above.  No evidence of malpositioning. 2. No acute intrathoracic process. Electronically Signed   By: Randa Ngo M.D.   On: 06/19/2020 20:25   DG C-Arm 1-60 Min-No Report  Result Date: 07/03/2020 Fluoroscopy was utilized by the requesting physician.  No radiographic interpretation.      Assessment & Plan: Ivan Larsen is a 65 y.o. white male with peripheral vascular disease, coronary artery disease, nephrolithiasis, hyperlipidemia, diabetes mellitus type II, who was admitted to [facility] on 06/23/2020 for Atherosclerosis of artery of extremity with ulceration ([place]) [I70.299,  W65.681] S/P aorto-bifemoral bypass surgery [Z95.828] Underwent bilateral femoral artery bypass graft by Dr. Lucky Cowboy and Dr. Delana Meyer.  1. Acute renal failure with hypernatremia and metabolic acidosis: with baseline creatinine of 0.9 with normal GFR on 06/25/20.  History of glycosuria and underlying diabetic kidney disease.  Nonoliguric urine output.  - Continue bicarbonate infusion - No indication for dialysis at this time.   2. Hypotension: with acute blood loss - circulatory shock. Requiring vasopressors: norepinephrine, phenylephrine and vasopressin.  - discontinue tamsulosin and gabapentin - stress dose steroids - please check blood pressure readings on both arms.   3. Hypokalemia - potassium chloride     LOS: 1 Bre P[place] 2/17/202212:48 PM

## 2020-07-02 NOTE — Progress Notes (Signed)
[facility]  Daily Progress Note   Subjective  -   Patient had profound hypotension and acidosis overnight.  This persists although it has improved.  He remains intubated and sedated and unable to provide history at this point.  Daughter is at bedside we discussed his care.  Objective Vitals:   07/02/20 0400 07/02/20 0500 07/02/20 0600 07/02/20 0700  BP: 100/69 113/71 108/62 117/60  Pulse: (!) 101 100 91 88  Resp:   18 18  Temp: 98.8 F (37.1 C) 100 F (37.8 C) (!) 100.4 F (38 C) (!) 100.6 F (38.1 C)  TempSrc:      SpO2: 100% 100% 100% 100%  Weight:      Height:        Intake/Output Summary (Last 24 hours) at 07/02/2020 1052 Last data filed at 07/02/2020 0600 Gross per 24 hour  Intake 10691.94 ml  Output 5400 ml  Net 5291.94 ml    PULM  equal bilaterally, on the ventilator CV  slightly tachycardic but regular VASC  feet are relatively warm.  Capillary refill is somewhat sluggish but he is on pressors.  Abdomen is soft.  Incisions are clean, dry, and intact.  Laboratory CBC    Component Value Date/Time   WBC 27.3 (H) 07/02/2020 0145   HGB 11.5 (L) 07/02/2020 0753   HGB 14.7 06/04/2020 1441   HCT 34.1 (L) 07/02/2020 0753   HCT 45.6 06/04/2020 1441   PLT 158 07/02/2020 0145   PLT 320 06/04/2020 1441    BMET    Component Value Date/Time   NA 150 (H) 07/02/2020 0346   NA 140 06/02/2020 1347   NA 141 07/03/2012 0511   K 3.4 (L) 07/02/2020 0346   K 4.0 07/03/2012 0511   CL 105 07/02/2020 0346   CL 108 (H) 07/03/2012 0511   CO2 12 (L) 07/02/2020 0346   CO2 24 07/03/2012 0511   GLUCOSE 234 (H) 07/02/2020 0346   GLUCOSE 185 (H) 07/03/2012 0511   BUN 22 07/02/2020 0346   BUN 24 06/02/2020 1347   BUN 14 07/03/2012 0511   CREATININE 1.87 (H) 07/02/2020 0346   CREATININE 0.86 07/03/2012 0511   CALCIUM 6.6 (L) 07/02/2020 0346   CALCIUM 8.1 (L) 07/03/2012 0511   GFRNONAA 39 (L) 07/02/2020 0346   GFRNONAA >60 07/03/2012 0511   GFRAA 76  06/02/2020 1347   GFRAA >60 07/03/2012 0511    Assessment/Planning: POD #1 s/p aortobifemoral bypass, bilateral renal endarterectomies, aortic endarterectomy, and bilateral femoral endarterectomies.   Profound hypotension and acidosis overnight not responding to bicarbonate  Pressors have been able to be weaned some this morning and blood pressure is doing better.  Still remains acidotic.  Plan for CRRT today.  Temporary dialysis catheter was placed by the critical care team overnight.  Had significant anemia coming out of the operating room and required 3 units of packed red blood cells.  Hemoglobin has responded appropriately and stayed stable at greater than 11.  Patient has been responding to stimuli but is sedated on the ventilator at current.  Abdomen is soft so low likelihood of compartment syndrome of the abdomen.  Particularly with low ventilator settings and not high peak pressures.  Lower extremities are not significantly swollen and does not appear to be compartment syndrome of the lower extremities.  Feet are warm and Doppler signals have been present despite pressors  Continue supportive care.  Fluid resuscitation.  CRRT.  Wean pressors as tolerated.  Appreciate critical care input  in this difficult case.  Patient does not have underlying comorbidities that were particularly severe and recovery certainly can be seen with supportive care at this point.  I did discuss with the daughter that he is quite ill and she voices her understanding.  Ivan Larsen  07/02/2020, 10:52 AM

## 2020-07-03 ENCOUNTER — Inpatient Hospital Stay
Admission: RE | Admit: 2020-07-03 | Discharge: 2020-07-03 | Disposition: A | Discharge status: Home / Self Care | Payer: Medicare Other | Source: Home / Self Care | Attending: Pulmonary Disease | Admitting: Pulmonary Disease

## 2020-07-03 ENCOUNTER — Inpatient Hospital Stay: Payer: Medicare Other

## 2020-07-03 DIAGNOSIS — J9601 Acute respiratory failure with hypoxia: Secondary | ICD-10-CM | POA: Diagnosis not present

## 2020-07-03 DIAGNOSIS — I70299 Other atherosclerosis of native arteries of extremities, unspecified extremity: Secondary | ICD-10-CM

## 2020-07-03 DIAGNOSIS — R579 Shock, unspecified: Secondary | ICD-10-CM

## 2020-07-03 DIAGNOSIS — L97909 Non-pressure chronic ulcer of unspecified part of unspecified lower leg with unspecified severity: Secondary | ICD-10-CM

## 2020-07-03 DIAGNOSIS — Z95828 Presence of other vascular implants and grafts: Secondary | ICD-10-CM

## 2020-07-03 LAB — CBC
HCT: 29.2 % — ABNORMAL LOW (ref 39.0–52.0)
Hemoglobin: 10 g/dL — ABNORMAL LOW (ref 13.0–17.0)
MCH: 27.2 pg (ref 26.0–34.0)
MCHC: 34.2 g/dL (ref 30.0–36.0)
MCV: 79.3 fL — ABNORMAL LOW (ref 80.0–100.0)
Platelets: 141 K/uL — ABNORMAL LOW (ref 150–400)
RBC: 3.68 MIL/uL — ABNORMAL LOW (ref 4.22–5.81)
WBC: 20.9 K/uL — ABNORMAL HIGH (ref 4.0–10.5)
nRBC: 1.9 % — ABNORMAL HIGH (ref 0.0–0.2)

## 2020-07-03 LAB — BLOOD GAS, ARTERIAL
Acid-Base Excess: 12.9 mmol/L — ABNORMAL HIGH (ref 0.0–2.0)
Bicarbonate: 35.2 mmol/L — ABNORMAL HIGH (ref 20.0–28.0)
FIO2: 35
MECHVT: 500 mL
Mechanical Rate: 14
O2 Saturation: 99.6 %
PEEP: 5 cmH2O
Patient temperature: 37
pCO2 arterial: 35 mmHg (ref 32.0–48.0)
pH, Arterial: 7.61 (ref 7.350–7.450)
pO2, Arterial: 148 mmHg — ABNORMAL HIGH (ref 83.0–108.0)

## 2020-07-03 LAB — PROCALCITONIN: Procalcitonin: 3.68 ng/mL

## 2020-07-03 LAB — HEMOGLOBIN AND HEMATOCRIT, BLOOD
HCT: 29.8 % — ABNORMAL LOW (ref 39.0–52.0)
HCT: 27.2 % — ABNORMAL LOW (ref 39.0–52.0)
HCT: 27.6 % — ABNORMAL LOW (ref 39.0–52.0)
Hemoglobin: 9.9 g/dL — ABNORMAL LOW (ref 13.0–17.0)
Hemoglobin: 9.4 g/dL — ABNORMAL LOW (ref 13.0–17.0)
Hemoglobin: 8.9 g/dL — ABNORMAL LOW (ref 13.0–17.0)

## 2020-07-03 LAB — BASIC METABOLIC PANEL
Anion gap: 14 (ref 5–15)
BUN: 47 mg/dL — ABNORMAL HIGH (ref 8–23)
CO2: 35 mmol/L — ABNORMAL HIGH (ref 22–32)
Calcium: 6 mg/dL — CL (ref 8.9–10.3)
Chloride: 100 mmol/L (ref 98–111)
Creatinine, Ser: 3.17 mg/dL — ABNORMAL HIGH (ref 0.61–1.24)
GFR, Estimated: 21 mL/min — ABNORMAL LOW (ref >60)
Glucose, Bld: 294 mg/dL — ABNORMAL HIGH (ref 70–99)
Potassium: 3.9 mmol/L (ref 3.5–5.1)
Sodium: 149 mmol/L — ABNORMAL HIGH (ref 135–145)

## 2020-07-03 LAB — HEPATIC FUNCTION PANEL
ALT: 581 U/L — ABNORMAL HIGH (ref 0–44)
AST: 1152 U/L — ABNORMAL HIGH (ref 15–41)
Albumin: 2.2 g/dL — ABNORMAL LOW (ref 3.5–5.0)
Alkaline Phosphatase: 66 U/L (ref 38–126)
Bilirubin, Direct: 0.1 mg/dL (ref 0.0–0.2)
Indirect Bilirubin: 0.5 mg/dL (ref 0.3–0.9)
Total Bilirubin: 0.6 mg/dL (ref 0.3–1.2)
Total Protein: 3.8 g/dL — ABNORMAL LOW (ref 6.5–8.1)

## 2020-07-03 LAB — PREPARE FRESH FROZEN PLASMA: Unit division: 0

## 2020-07-03 LAB — GLUCOSE, CAPILLARY
Glucose-Capillary: 252 mg/dL — ABNORMAL HIGH (ref 70–99)
Glucose-Capillary: 223 mg/dL — ABNORMAL HIGH (ref 70–99)
Glucose-Capillary: 184 mg/dL — ABNORMAL HIGH (ref 70–99)
Glucose-Capillary: 146 mg/dL — ABNORMAL HIGH (ref 70–99)
Glucose-Capillary: 135 mg/dL — ABNORMAL HIGH (ref 70–99)
Glucose-Capillary: 123 mg/dL — ABNORMAL HIGH (ref 70–99)

## 2020-07-03 LAB — BPAM FFP
Blood Product Expiration Date: 202202212359
ISSUE DATE / TIME: 202202170016
Unit Type and Rh: 6200

## 2020-07-03 LAB — SURGICAL PATHOLOGY

## 2020-07-03 LAB — MAGNESIUM: Magnesium: 2.3 mg/dL (ref 1.7–2.4)

## 2020-07-03 LAB — HEMOGLOBIN A1C
Hgb A1c MFr Bld: 5.7 % — ABNORMAL HIGH (ref 4.8–5.6)
Mean Plasma Glucose: 117 mg/dL

## 2020-07-03 LAB — PHOSPHORUS: Phosphorus: 5.4 mg/dL — ABNORMAL HIGH (ref 2.5–4.6)

## 2020-07-03 IMAGING — DX DG ABDOMEN 1V
2 series · 2 of 2 positions shown · non-contrast
Comparison: [DATE]

CLINICAL DATA: Vomiting

EXAM:
ABDOMEN - 1 VIEW

[abdomen supine (1 of 2)]
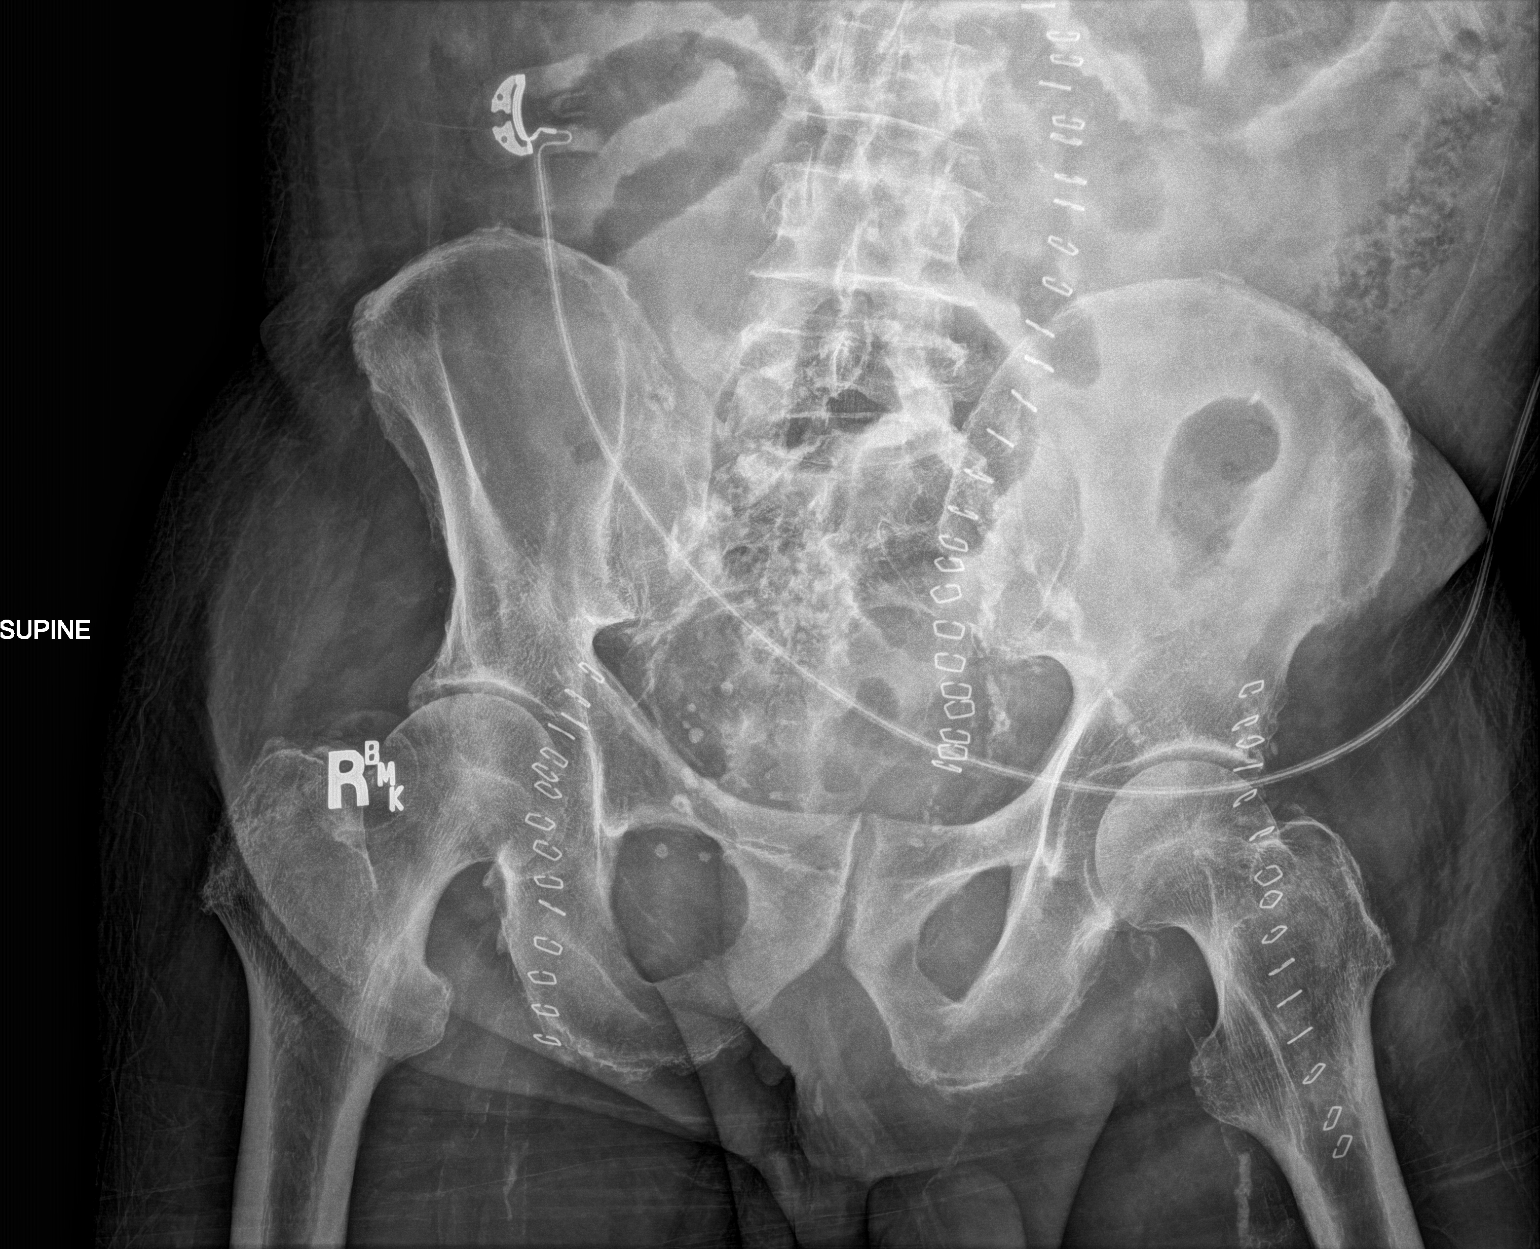

[abdomen supine (2 of 2)]
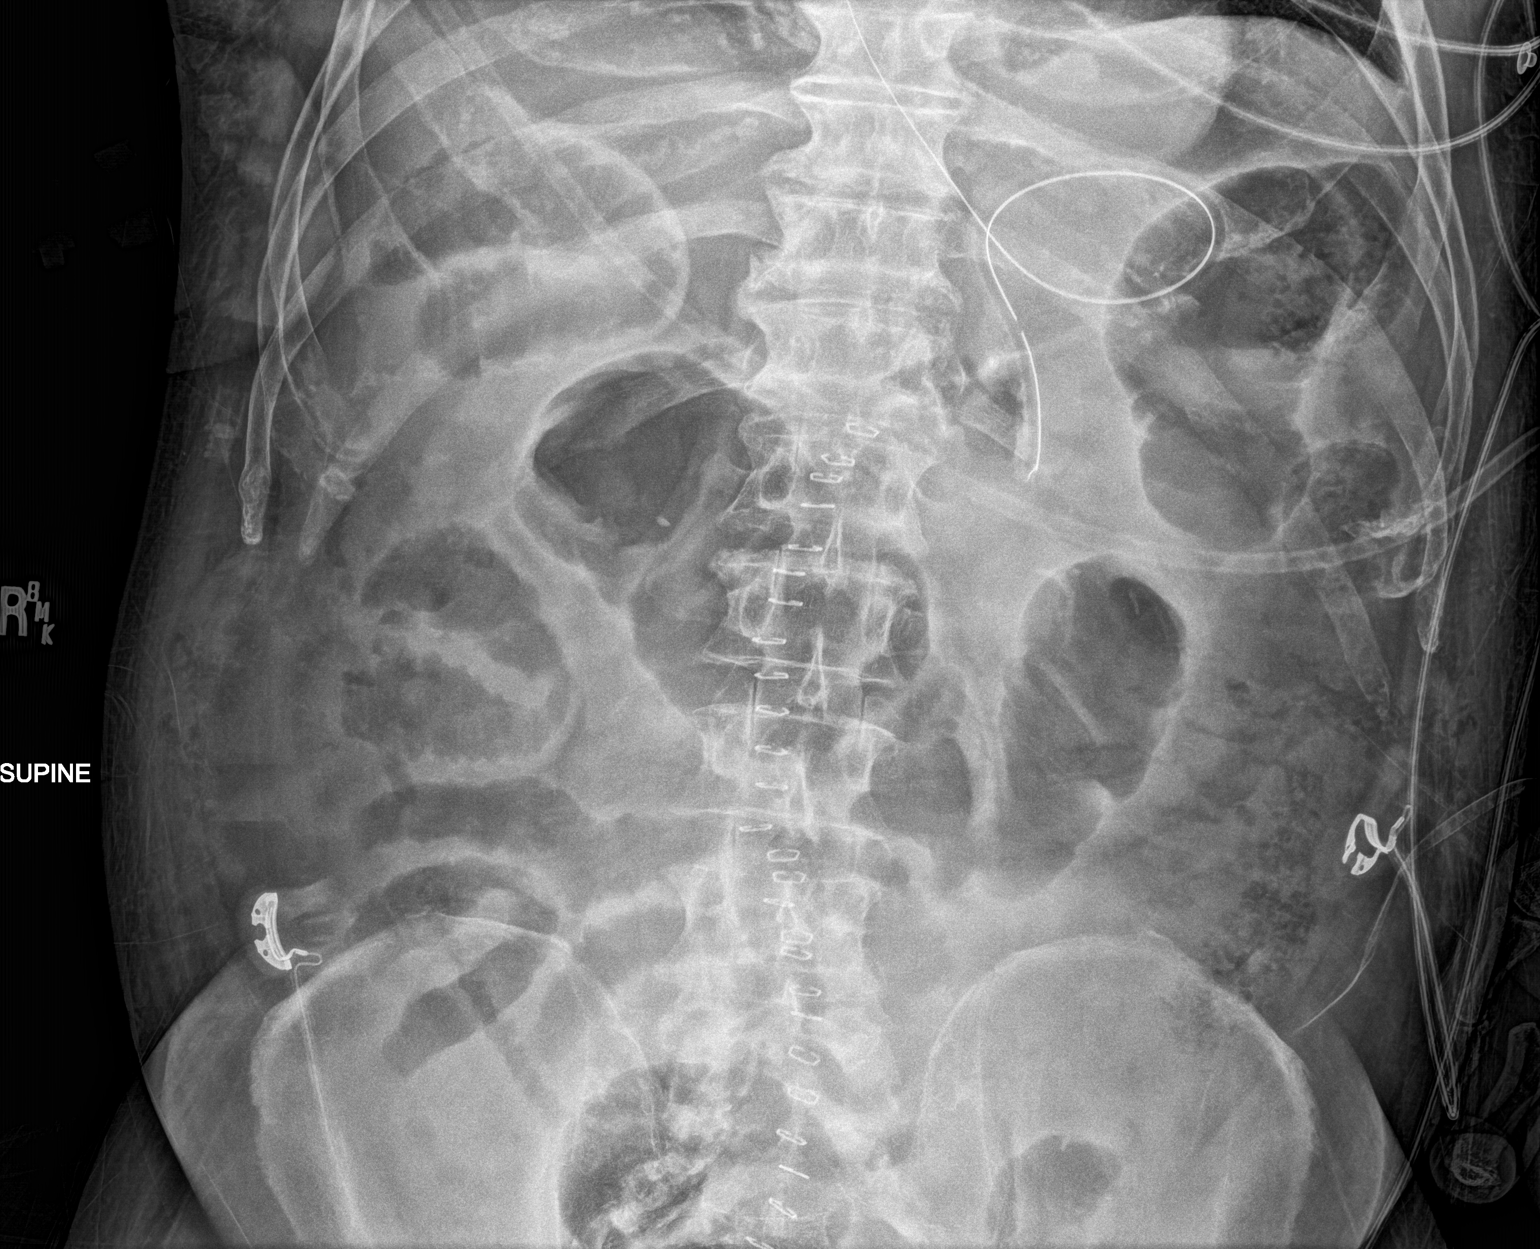

[2 of 2 positions shown; findings below may reference images not displayed]

FINDINGS: NG tube coils in the stomach. Gas within mildly prominent large and
small bowel loops. No visible free air or organomegaly.
IMPRESSION: Gas throughout mildly prominent large and small bowel loops may
reflect mild ileus. NG tube in the stomach.

## 2020-07-03 MED ORDER — INSULIN DETEMIR 100 UNIT/ML ~~LOC~~ SOLN
6.0000 [IU] | Freq: Two times a day (BID) | SUBCUTANEOUS | Status: DC
  Administered 2020-07-03 – 2020-07-08 (×12): 6 [IU] via SUBCUTANEOUS
  Filled 2020-07-03 (×13): qty 0.06

## 2020-07-03 MED ORDER — DOCUSATE SODIUM 50 MG/5ML PO LIQD
100.0000 mg | Freq: Every day | ORAL | Status: DC
  Administered 2020-07-03 – 2020-07-14 (×10): 100 mg
  Filled 2020-07-03 (×10): qty 10

## 2020-07-03 MED ORDER — MIDODRINE HCL 5 MG PO TABS
5.0000 mg | ORAL_TABLET | Freq: Three times a day (TID) | ORAL | Status: DC
  Administered 2020-07-03 – 2020-07-04 (×3): 5 mg
  Filled 2020-07-03 (×4): qty 1

## 2020-07-03 MED ORDER — MIDODRINE HCL 5 MG PO TABS: 5.0000 mg | ORAL_TABLET | Freq: Three times a day (TID) | ORAL | Status: DC

## 2020-07-03 MED ORDER — SODIUM CHLORIDE 0.9 % IV SOLN
3.0000 g | Freq: Four times a day (QID) | INTRAVENOUS | Status: DC
  Administered 2020-07-03 (×2): 3 g via INTRAVENOUS
  Filled 2020-07-03: qty 8
  Filled 2020-07-03: qty 3
  Filled 2020-07-03 (×2): qty 8

## 2020-07-03 MED ORDER — PERFLUTREN LIPID MICROSPHERE
1.0000 mL | INTRAVENOUS | Status: AC | PRN
  Administered 2020-07-03: 2 mL via INTRAVENOUS
  Filled 2020-07-03: qty 10

## 2020-07-03 MED ORDER — SODIUM CHLORIDE 0.45 % IV SOLN: INTRAVENOUS | Status: DC

## 2020-07-03 MED ORDER — CALCIUM GLUCONATE-NACL 2-0.675 GM/100ML-% IV SOLN
2.0000 g | Freq: Once | INTRAVENOUS | Status: AC
  Administered 2020-07-03: 2000 mg via INTRAVENOUS
  Filled 2020-07-03: qty 100

## 2020-07-03 MED ORDER — SODIUM CHLORIDE 0.9 % IV SOLN
3.0000 g | Freq: Two times a day (BID) | INTRAVENOUS | Status: DC
  Administered 2020-07-03 – 2020-07-06 (×6): 3 g via INTRAVENOUS
  Filled 2020-07-03 (×3): qty 8
  Filled 2020-07-03 (×2): qty 3
  Filled 2020-07-03 (×2): qty 8

## 2020-07-03 MED ORDER — NOREPINEPHRINE 16 MG/250ML-% IV SOLN
0.0000 ug/min | INTRAVENOUS | Status: DC
  Administered 2020-07-03: 12 ug/min via INTRAVENOUS
  Administered 2020-07-03: 2 ug/min via INTRAVENOUS
  Administered 2020-07-04: 6 ug/min via INTRAVENOUS
  Filled 2020-07-03: qty 250

## 2020-07-03 NOTE — Progress Notes (Signed)
Inpatient Diabetes Program Recommendations  AACE/ADA: New Consensus Statement on Inpatient Glycemic Control (2015)  Target Ranges:  Prepandial:   less than 140 mg/dL      Peak postprandial:   less than 180 mg/dL (1-2 hours)      Critically ill patients:  140 - 180 mg/dL   Results for VANDERBILT, RANIERI (MRN [id number]) as of 07/03/2020 07:29  Ref. Range 07/02/2020 11:17 07/02/2020 16:09 07/02/2020 19:27 07/02/2020 23:18 07/03/2020 03:16  Glucose-Capillary Latest Ref Range: 70 - 99 mg/dL 366 (H) 294 (H) 765 (H) 205 (H) 252 (H)    Outpatient Diabetes meds: Jardiance 25 mg daily             Metformin 1000 mg bid               Januvia 25 mg daily   Current orders: Novolog Resistant Correction Scale/ SSI (0-20 units) Q4 hours   MD--Please consider adding Levemir 6 units bid (0.2 units/kg split dose)    --Will follow patient during hospitalization--  Ambrose Finland RN, MSN, CDE Diabetes Coordinator Inpatient Glycemic Control Team Team Pager: [phone] (8a-5p)

## 2020-07-03 NOTE — Progress Notes (Signed)
[facility]  Daily Progress Note   Subjective  -   Patient remains intubated and sedated.  He is now down on his pressors but not entirely off.  Remains on sedation drips as well. Urine output has been reasonably good but his creatinine is greater than 3.  Acidosis has improved.  Hemoglobin is stable.  Objective Vitals:   07/03/20 0800 07/03/20 0815 07/03/20 0830 07/03/20 0845  BP: (!) 89/59 91/60 (!) 87/65 90/61  Pulse: (!) 59 62 61 61  Resp: 17 14 (!) 23 (!) 28  Temp: 98.96 F (37.2 C) 99.14 F (37.3 C) 99.14 F (37.3 C) 99.32 F (37.4 C)  TempSrc:      SpO2: 98% 98% 99% 99%  Weight:      Height:        Intake/Output Summary (Last 24 hours) at 07/03/2020 0918 Last data filed at 07/03/2020 0240 Gross per 24 hour  Intake 4936.55 ml  Output 1700 ml  Net 3236.55 ml    PULM  CTAB CV  RRR VASC  incisions are all clean, dry, and intact.  Feet are both cool but capillary refill is present.  Dopplerable pulses.  Laboratory CBC    Component Value Date/Time   WBC 20.9 (H) 07/03/2020 0405   HGB 10.0 (L) 07/03/2020 0405   HGB 14.7 06/04/2020 1441   HCT 29.2 (L) 07/03/2020 0405   HCT 45.6 06/04/2020 1441   PLT 141 (L) 07/03/2020 0405   PLT 320 06/04/2020 1441    BMET    Component Value Date/Time   NA 149 (H) 07/03/2020 0405   NA 140 06/02/2020 1347   NA 141 07/03/2012 0511   K 3.9 07/03/2020 0405   K 4.0 07/03/2012 0511   CL 100 07/03/2020 0405   CL 108 (H) 07/03/2012 0511   CO2 35 (H) 07/03/2020 0405   CO2 24 07/03/2012 0511   GLUCOSE 294 (H) 07/03/2020 0405   GLUCOSE 185 (H) 07/03/2012 0511   BUN 47 (H) 07/03/2020 0405   BUN 24 06/02/2020 1347   BUN 14 07/03/2012 0511   CREATININE 3.17 (H) 07/03/2020 0405   CREATININE 0.86 07/03/2012 0511   CALCIUM 6.0 (LL) 07/03/2020 0405   CALCIUM 8.1 (L) 07/03/2012 0511   GFRNONAA 21 (L) 07/03/2020 0405   GFRNONAA >60 07/03/2012 0511   GFRAA 76 06/02/2020 1347   GFRAA >60 07/03/2012 0511     Assessment/Planning: POD #2 s/p aortobifemoral bypass, bilateral renal endarterectomies, bilateral femoral endarterectomies and aortic endarterectomy   Acidosis has corrected and he is now alkalotic.  Agree with half-normal saline infusion and discontinuation of bicarbonate.  Hemoglobin is stable.  No signs of ongoing bleeding.  Nonoliguric renal failure.  Nephrology is seeing.  No role for dialysis at this time but if they feel necessary we are certainly on board with that.  Hypotension requiring pressors.  Pressors have been weaned but not discontinued.  As he remains on sedative agents that is lowering his pressure some as well.  Feet are cool but on pressors that is difficult to discern.  Perfusion appears improved but he has infrainguinal disease as well.  Monitor for now.  Continue full supportive care.  Appreciate consultants input    Festus Barren  07/03/2020, 9:18 AM

## 2020-07-03 NOTE — Progress Notes (Signed)
Pharmacy Antibiotic Note  TIVIS WHERRY is a 65 y.o. male with medical history including severe PVD admitted on 07/10/2020 with post-operative shock from OR for elective endovascular surgery for PVD. Patient is intubated, sedated, and on mechanical ventilation in the ICU. Patient is on vasopressors and stress dose steroids. Pharmacy has been consulted for Unasyn dosing for suspected aspiration pneumonia.  Renal function worse today with Scr 3.17 (BL ~ 1). Nephrology is following and no indication for dialysis at this time.   Plan: Adjust Unasyn to 3 g IV q12h per renal function  Continue to monitor cultures, renal function, clinical status  Height: 5\' 5"  (165.1 cm) Weight: 66.2 kg (145 lb 15.1 oz) IBW/kg (Calculated) : 61.5  Temp (24hrs), Avg:99.4 F (37.4 C), Min:98.96 F (37.2 C), Max:100.58 F (38.1 C)  Recent Labs  Lab 06/30/2020 0741 06/29/2020 1507 06/19/2020 1807 06/21/2020 2020 06/30/2020 2301 07/02/20 0145 07/02/20 0346 07/02/20 0502 07/03/20 0405  WBC 13.3* 21.9* 24.6*  --   --  27.3*  --   --  20.9*  CREATININE 0.91  --   --  1.45*  --   --  1.87*  --  3.17*  LATICACIDVEN  --   --   --  >11.0* >11.0*  --   --  >11.0*  --     Estimated Creatinine Clearance: 20.2 mL/min (A) (by C-G formula based on SCr of 3.17 mg/dL (H)).    Allergies  Allergen Reactions  . Iodinated Diagnostic Agents Nausea Only    Other reaction(s): Vomiting  . Iodine   . Shellfish Allergy Nausea And Vomiting and Other (See Comments)    Sweating  Sweating    . Shellfish-Derived Products Nausea And Vomiting and Other (See Comments)    Other reaction(s): Nausea And Vomiting, Other (See Comments) Sweating  Sweating     Antimicrobials this admission: Cefazolin 2/16 >> 2/17 (surgical prophylaxis)  Unasyn 2/18  >>   Dose adjustments this admission: 2/18: Unasyn adjusted from 3 g IV q6h to 3 g IV q12h given worsening CrCl  Microbiology results: 2/18 Tracheal aspirate: pending 2/16 BCx:  NGTD 2/16 MRSA PCR: (-)  Thank you for allowing pharmacy to be a part of this patient's care.  3/16 07/03/2020 12:31 PM

## 2020-07-03 NOTE — Progress Notes (Signed)
NAME:  Ivan Larsen, MRN:  [id number], DOB:  1955/10/23, LOS: 2 ADMISSION DATE:  07/11/2020, CONSULTATION DATE:  06/30/2020 REFERRING MD:  Dr. Lucky Cowboy, CHIEF COMPLAINT:  Hemorrhagic shock   Brief History:  65 yo male with hx of severe peripheral vascular disease, dm, dyslipidemia who underwent elective Aortobifemoral bypass on 07/09/2020. Intraop he lost apx 2L blood with 1/2 returned via blood saver, received IVF fluids.  After surgery patient was in circulatory shock and was emergently brought to MICU due to medical instability. He required vasopressor support perioperatively.        Past Medical History:  Peripheral artery disease Myocardial infarction Hyperlipidemia Kidney stones Elevated liver enzyme Diabetes mellitus Coronary artery disease  Significant Hospital Events:  2/16: Underwent elective aortobifemoral bypass; intraoperative blood loss with hemorrhagic shock: Transferred to ICU post procedure remained intubated 2/17: AKI, nephrology consulted, temporary dialysis catheter placed  Consults:  Vascular surgery Nephrology PCCM   Procedures:  2/16: Aortofemoral bypass 2/16: Right IJ CVC placed 2/17: Left IJ temporary dialysis catheter placed  Significant Diagnostic Tests:  2/17: Renal ultrasound>Question medical renal disease changes of both kidneys. No evidence of renal mass or hydronephrosis.Minimal ascites. 2/18: KUB>>Gas throughout mildly prominent large and small bowel loops may reflect mild ileus. NG tube in the stomach. 2/18: Echocardiogram>>  Micro Data:  2/14: SARS-CoV-2 PCR>> negative 2/16: MRSA PCR>> negative 2/16: Blood culture x2>> 2/18: Tracheal aspirate>>  Antimicrobials:  Cefazolin 2/16>> 2/17 (surgical prophylaxis). Unasyn 2/18>>  Interim History / Subjective:  -Pt started on Unasyn earlier this morning for questionable Pneumonia -Afebrile -Requiring Neosynephrine and Vasopressin gtt's to maintain MAP >65 -Noted to have metabolic  alkalosis on ABG this morning (7.61 / 35 / 148 / 35.2) ~ bicarb gtt being discontinued by Neprhology -Pt on minimal vent settings (35% FiO2, 5 PEEP) ~ will attempt SBT today    Objective   Blood pressure (!) 82/57, pulse 62, temperature 99.5 F (37.5 C), resp. rate 14, height _0  (1.651 m), weight 66.2 kg, SpO2 98 %.    Vent Mode: PRVC FiO2 (%):  [28 %-35 %] 28 % Set Rate:  [14 bmp-18 bmp] 14 bmp Vt Set:  [500 mL] 500 mL PEEP:  [5 cmH20] 5 cmH20 Plateau Pressure:  [15 cmH20-18 cmH20] 18 cmH20   Intake/Output Summary (Last 24 hours) at 07/03/2020 1602 Last data filed at 07/03/2020 0175 Gross per 24 hour  Intake 4936.55 ml  Output 1150 ml  Net 3786.55 ml   Filed Weights   07/13/2020 0801 07/04/2020 1630  Weight: 63.5 kg 66.2 kg    Examination: General: Critically ill-appearing male, laying in bed, intubated and sedated, no acute distress HENT: Atraumatic, normocephalic, neck supple, no JVD, ET tube in place Lungs: Clear to auscultation bilaterally, synchronous with the ventilator, even Cardiovascular: Regular rate and rhythm, S1-S2, no murmurs, rubs, gallops Abdomen: Distended, tender, bowel sounds positive x4, midline abdominal honeycomb dressing clean dry and intact Extremities: No deformities, 1+ edema bilateral lower extremities Neuro: Sedated, withdraws from pain (reported he follows commands off sedation), pupils PERRLA GU: Foley catheter in place  Resolved Hospital Problem list   N/A  Assessment & Plan:   Post-op Respiratory in the setting of Shock and Metabolic Derangements -Full vent support -Wean FiO2 and PEEP as tolerated to maintain O2 sats greater than 92% -Follow intermittent chest x-ray and ABG as needed -VAP bundle implemented -Spontaneous breathing trials when respiratory parameters met ~we will plan on SBT today 2/18   Hemorrhagic shock +/-septic shock -Continuous cardiac monitoring -  Maintain MAP greater than 65 -IV fluids -Blood transfusions as  indicated -Vasopressors if needed (currently on Neo-Synephrine and vasopressin) ~discussed with Dr. Patsey Berthold, will transition from Neo-Synephrine to Levophed -Stress dose steroids -Start on Midodrine -Obtain 2D echocardiogram   Questionable pneumonia -Monitor fever -Curve trend WBCs and procalcitonin -Follow cultures as above ~will obtain tracheal aspirate today 2/18 -Continue empiric Unasyn for now   Acute Blood Loss Anemia -Monitor for S/Sx of bleeding -Trend CBC (H&H q6h) -SCD's for VTE Prophylaxis (can start chemical prophylaxis when cleared by surgery) -Transfuse for Hgb <8   AKI Metabolic alkalosis Hypernatremia -Monitor I&O's / urinary output -Follow BMP -Ensure adequate renal perfusion -Avoid nephrotoxic agents as able -Replace electrolytes as indicated -Nephrology following, appreciate input -Discontinuing bicarb drip, placing on 1/2 normal saline   Severe Peripheral Vascular Disease s/p Aortobifemoral bypass -Vascular Surgery following, will follow recommendations -Pain control   Hyperglycemia CBG's SSI Follow ICU Hypo/Hyperglycemia protocol   Best practice (evaluated daily)  Diet: NPO Pain/Anxiety/Delirium protocol (if indicated): Fentanyl/Precedex VAP protocol (if indicated): yes, implemented DVT prophylaxis: SCD's GI prophylaxis: Pepcid Glucose control: SSI, Levemir Mobility: Bedrest Disposition:ICU  Goals of Care:  Last date of multidisciplinary goals of care discussion:07/03/2020 Family and staff present: Bedside RN and pts daughter at bedside Summary of discussion: Plan for SBT today, wean vasopressors as able, continue ABX for potential PNA Follow up goals of care discussion due: 07/04/2020 Code Status: FULL CODE  Labs   CBC: Recent Labs  Lab 06/22/2020 0741 06/22/2020 1507 07/09/2020 1807 06/19/2020 2112 07/02/20 0145 07/02/20 0753 07/02/20 1519 07/02/20 2025 07/03/20 0405 07/03/20 0902 07/03/20 1511  WBC 13.3* 21.9* 24.6*  --  27.3*   --   --   --  20.9*  --   --   NEUTROABS 9.5*  --   --   --   --   --   --   --   --   --   --   HGB 13.7 8.3* 6.6*   < > 11.9*   < > 11.0* 9.9* 10.0* 9.9* 9.4*  HCT 41.1 24.7* 20.5*   < > 37.1*   < > 31.1* 27.9* 29.2* 29.8* 27.2*  MCV 85.6 87.6 91.5  --  82.6  --   --   --  79.3*  --   --   PLT 290 216 246  --  158  --   --   --  141*  --   --    < > = values in this interval not displayed.    Basic Metabolic Panel: Recent Labs  Lab 07/03/2020 0741 07/11/2020 2020 07/02/20 0346 07/03/20 0405  NA 140 146* 150* 149*  K 3.8 3.4* 3.4* 3.9  CL 100 108 105 100  CO2 26 14* 12* 35*  GLUCOSE 176* 219* 234* 294*  BUN _0 47*  CREATININE 0.91 1.45* 1.87* 3.17*  CALCIUM 9.7 6.4* 6.6* 6.0*  MG  --   --  1.4* 2.3  PHOS  --   --  9.1* 5.4*   GFR: Estimated Creatinine Clearance: 20.2 mL/min (A) (by C-G formula based on SCr of 3.17 mg/dL (H)). Recent Labs  Lab 07/03/2020 1507 06/17/2020 1807 06/18/2020 2020 06/29/2020 2301 07/02/20 0145 07/02/20 0346 07/02/20 0502 07/03/20 0405  PROCALCITON  --   --  0.12  --   --  1.30  --  3.68  WBC 21.9* 24.6*  --   --  27.3*  --   --  20.9*  LATICACIDVEN  --   --  >  11.0* >11.0*  --   --  >11.0*  --     Liver Function Tests: Recent Labs  Lab 07/02/20 0346 07/02/20 0753 07/03/20 0405  AST 554* 849* 1,152*  ALT 524* 777* 581*  ALKPHOS 42 46 66  BILITOT 0.8 0.6 0.6  PROT 4.0* 3.9* 3.8*  ALBUMIN 2.6* 2.4* 2.2*   No results for input(s): LIPASE, AMYLASE in the last 168 hours. No results for input(s): AMMONIA in the last 168 hours.  ABG    Component Value Date/Time   PHART 7.61 (HH) 07/03/2020 0500   PCO2ART 35 07/03/2020 0500   PO2ART 148 (H) 07/03/2020 0500   HCO3 35.2 (H) 07/03/2020 0500   ACIDBASEDEF 14.7 (H) 07/02/2020 0555   O2SAT 99.6 07/03/2020 0500     Coagulation Profile: Recent Labs  Lab 06/16/2020 0741  INR 1.0    Cardiac Enzymes: Recent Labs  Lab 07/02/20 0346 07/02/20 0753  CKTOTAL 114 178    HbA1C: Hemoglobin  A1C  Date/Time Value Ref Range Status  12/22/2015 12:00 AM 7.3  Final   HB A1C (BAYER DCA - WAIVED)  Date/Time Value Ref Range Status  06/02/2020 01:44 PM 6.3 <7.0 % Final    Comment:                                          Diabetic Adult            <7.0                                       Healthy Adult        4.3 - 5.7                                                           (DCCT/NGSP) American Diabetes Association's Summary of Glycemic Recommendations for Adults with Diabetes: Hemoglobin A1c <7.0%. More stringent glycemic goals (A1c <6.0%) may further reduce complications at the cost of increased risk of hypoglycemia.   02/07/2020 08:07 AM 6.4 <7.0 % Final    Comment:                                          Diabetic Adult            <7.0                                       Healthy Adult        4.3 - 5.7                                                           (DCCT/NGSP) American Diabetes Association's Summary of Glycemic Recommendations for Adults with Diabetes: Hemoglobin A1c <7.0%. More stringent glycemic goals (A1c <6.0%)  may further reduce complications at the cost of increased risk of hypoglycemia.    Hgb A1c MFr Bld  Date/Time Value Ref Range Status  07/02/2020 03:46 AM 5.7 (H) 4.8 - 5.6 % Final    Comment:    (NOTE)         Prediabetes: 5.7 - 6.4         Diabetes: >6.4         Glycemic control for adults with diabetes: <7.0     CBG: Recent Labs  Lab 07/02/20 2318 07/03/20 0316 07/03/20 0736 07/03/20 1100 07/03/20 1551  GLUCAP 205* 252* 223* 184* 146*    Review of Systems:   Unable to assess due to intubation and sedation  Past Medical History:  He,  has a past medical history of CAD (coronary artery disease), Diabetes mellitus without complication ([place]), Elevated liver enzymes, History of kidney stones, Hyperlipidemia, MI (myocardial infarction) ([place]) (07/01/12), and PAD (peripheral artery disease) ([place]).   Surgical History:   Past Surgical  History:  Procedure Laterality Date  . AORTA - BILATERAL FEMORAL ARTERY BYPASS GRAFT N/A 07/06/2020   Procedure: AORTA BIFEMORAL BYPASS GRAFT (VERSUS BILATERAL FEMORAL ENDARTERECTOMIES);  Surgeon: Algernon Huxley, MD;  Location: [facility] ORS;  Service: Vascular;  Laterality: N/A;  . CORONARY STENT PLACEMENT    . EYE SURGERY    . LOWER EXTREMITY ANGIOGRAPHY Left 06/25/2020   Procedure: LOWER EXTREMITY ANGIOGRAPHY;  Surgeon: Algernon Huxley, MD;  Location: [facility];  Service: Cardiovascular;  Laterality: Left;     Social History:   reports that he quit smoking about 16 years ago. His smoking use included cigarettes. He has never used smokeless tobacco. He reports current alcohol use. He reports that he does not use drugs.   Family History:  His family history includes Diabetes in his mother; Heart attack in his father.   Allergies Allergies  Allergen Reactions  . Iodinated Diagnostic Agents Nausea Only    Other reaction(s): Vomiting  . Iodine   . Shellfish Allergy Nausea And Vomiting and Other (See Comments)    Sweating  Sweating    . Shellfish-Derived Products Nausea And Vomiting and Other (See Comments)    Other reaction(s): Nausea And Vomiting, Other (See Comments) Sweating  Sweating      Home Medications  Prior to Admission medications   Medication Sig Start Date End Date Taking? Authorizing Provider  amLODipine (NORVASC) 10 MG tablet Take 1 tablet (10 mg total) by mouth daily. 02/17/20  Yes Bacigalupo, Dionne Bucy, MD  atorvastatin (LIPITOR) 40 MG tablet Take 1 tablet (40 mg total) by mouth daily. 02/17/20  Yes Bacigalupo, Dionne Bucy, MD  benazepril (LOTENSIN) 40 MG tablet Take 1 tablet (40 mg total) by mouth daily. 02/17/20  Yes Bacigalupo, Dionne Bucy, MD  collagenase (SANTYL) ointment Apply 1 application topically daily. 05/05/20  Yes Cannady, Henrine Screws T, NP  empagliflozin (JARDIANCE) 25 MG TABS tablet Take 1 tablet (25 mg total) by mouth daily before breakfast. 03/20/20  Yes Noemi Chapel A, NP  gabapentin (NEURONTIN) 300 MG capsule Take 1 capsule (300 mg total) by mouth 3 (three) times daily. 05/05/20  Yes Cannady, Jolene T, NP  hydrochlorothiazide (HYDRODIURIL) 25 MG tablet Take 1 tablet (25 mg total) by mouth daily. 03/20/20  Yes Eulogio Bear, NP  HYDROcodone-acetaminophen (NORCO) 5-325 MG tablet Take 1 tablet by mouth every 6 (six) hours as needed for moderate pain. 06/25/20  Yes Algernon Huxley, MD  meclizine (ANTIVERT) 25 MG tablet Take 1  tablet (25 mg total) by mouth 3 (three) times daily as needed for dizziness or nausea. 08/07/19  Yes Volney American, PA-C  metoprolol tartrate (LOPRESSOR) 50 MG tablet Take 1 tablet (50 mg total) by mouth 2 (two) times daily. 02/13/20  Yes Eulogio Bear, NP  mupirocin ointment (BACTROBAN) 2 % Apply 1 application topically 2 (two) times daily. 06/02/20  Yes Cannady, Jolene T, NP  Omega-3 Fatty Acids (FISH OIL OMEGA-3) 1000 MG CAPS Take by mouth 2 (two) times daily.   Yes [provider]  ondansetron (ZOFRAN-ODT) 4 MG disintegrating tablet 1 TABLET ORALLY DISSOLVED EVERY 8 HOURS IF NEEDED FOR NAUSEA AND VOMITING Patient taking differently: Take 4 mg by mouth every 8 (eight) hours as needed for nausea or vomiting. 12/27/19  Yes Noemi Chapel A, NP  sitaGLIPtin (JANUVIA) 25 MG tablet Take 1 tablet (25 mg total) by mouth daily. 03/20/20  Yes Eulogio Bear, NP  tamsulosin (FLOMAX) 0.4 MG CAPS capsule Take 1 capsule (0.4 mg total) by mouth at bedtime. Take at nighttime to minimize dizziness/hypotension 03/20/20  Yes Noemi Chapel A, NP  clopidogrel (PLAVIX) 75 MG tablet Take 1 tablet (75 mg total) by mouth daily. Patient not taking: No sig reported 02/13/20   Eulogio Bear, NP  doxycycline (VIBRA-TABS) 100 MG tablet Take 1 tablet (100 mg total) by mouth 2 (two) times daily. Patient not taking: No sig reported 06/02/20   Marnee Guarneri T, NP  metFORMIN (GLUCOPHAGE) 500 MG tablet Take 2 tablets (1,000 mg  total) by mouth 2 (two) times daily with a meal. 03/20/20   Eulogio Bear, NP     Critical care time: 35 minutes    Darel Hong, [facility] Pager: [phone]

## 2020-07-03 NOTE — Progress Notes (Addendum)
0700: Shift change report received from [facility]. Drips verified. Pt has Precedex, Fentanyl, Neo-synephrine and Vasopressin infusing.   1050: Neo-synephrine weaned off and switched to Levophed per Dr. Jayme Cloud.   Patient off Fentanyl since 1400. And started waking up around 1630. SBT started. HOB elevated and BP dropped. Levophed titrated accordingly.   Pt gets restless and daughter at bedside and was able to calm him down.   1640: Patient off vasopressin.   1700: Patient vomited and NGT connected to suction. He had about 200 ml brown output. Zofran prn given. Oral medication, Midodrine held for now.   1800: Dressing soiled with secretions. Dressing change done on the R neck CVC.   Daughter at bedside the whole day. She's anxious and very worried about her dad. Offered to call the chaplain and she declined.

## 2020-07-03 NOTE — Progress Notes (Signed)
*  PRELIMINARY RESULTS* Echocardiogram 2D Echocardiogram has been performed.  Ivan Larsen 07/03/2020, 12:13 PM

## 2020-07-03 NOTE — Progress Notes (Signed)
Pharmacy Antibiotic Note  Ivan Larsen is a 65 y.o. male admitted on 07-08-20 with pneumonia.  Pharmacy has been consulted for Unasyn dosing.  CrCl = 34.3 ml/min  Plan: Unasyn 3 gm IV Q6H ordered to start on 2/18 @ 0330.  Height: 5\' 5"  (165.1 cm) Weight: 66.2 kg (145 lb 15.1 oz) IBW/kg (Calculated) : 61.5  Temp (24hrs), Avg:99.8 F (37.7 C), Min:98.8 F (37.1 C), Max:100.76 F (38.2 C)  Recent Labs  Lab 07-08-2020 0741 07-08-2020 1507 07-08-20 1807 07/08/20 2020 Jul 08, 2020 2301 07/02/20 0145 07/02/20 0346 07/02/20 0502  WBC 13.3* 21.9* 24.6*  --   --  27.3*  --   --   CREATININE 0.91  --   --  1.45*  --   --  1.87*  --   LATICACIDVEN  --   --   --  >11.0* >11.0*  --   --  >11.0*    Estimated Creatinine Clearance: 34.3 mL/min (A) (by C-G formula based on SCr of 1.87 mg/dL (H)).    Allergies  Allergen Reactions  . Iodinated Diagnostic Agents Nausea Only    Other reaction(s): Vomiting  . Iodine   . Shellfish Allergy Nausea And Vomiting and Other (See Comments)    Sweating  Sweating    . Shellfish-Derived Products Nausea And Vomiting and Other (See Comments)    Other reaction(s): Nausea And Vomiting, Other (See Comments) Sweating  Sweating     Antimicrobials this admission:   >>    >>   Dose adjustments this admission:   Microbiology results:  BCx:   UCx:    Sputum:    MRSA PCR:   Thank you for allowing pharmacy to be a part of this patient's care.  Novaleigh Kohlman D 07/03/2020 3:22 AM

## 2020-07-03 NOTE — Progress Notes (Signed)
[facility]  ROUNDING NOTE   Subjective:   UOP 1200 Weaned off norepinephrine. Remains on phenylephrine and vasopressin  Follows commands off sedation  Objective:  Vital signs in last 24 hours:  Temp:  [98.96 F (37.2 C)-100.76 F (38.2 C)] 100.22 F (37.9 C) (02/18 0300) Pulse Rate:  [68-95] 70 (02/18 0300) Resp:  [0-23] 14 (02/18 0300) BP: (87-138)/(60-97) 97/61 (02/18 0300) SpO2:  [97 %-100 %] 97 % (02/18 0300) FiO2 (%):  [35 %] 35 % (02/18 0256)  Weight change:  Filed Weights   06/17/2020 0801 06/22/2020 1630  Weight: 63.5 kg 66.2 kg    Intake/Output: I/O last 3 completed shifts: In: 10033.5 [I.V.:7222.5; Blood:1575; IV KMMNOTRRN:1657] Out: 9038 [Urine:3350; Emesis/NG output:500]   Intake/Output this shift:  No intake/output data recorded.  Physical Exam: General: Critically ill  Head: ETT   Eyes: PERRL  Neck: trachea midline  Lungs:  PRVC FiO2 35%  Heart: Regular rate and rhythm  Abdomen:  +distended, dressings clean  Extremities: +peripheral edema.  Neurologic: Intubated and sedated  Skin: No lesions  Access: none    Basic Metabolic Panel: Recent Labs  Lab 06/24/2020 0741 06/16/2020 2020 07/02/20 0346 07/03/20 0405  NA 140 146* 150* 149*  K 3.8 3.4* 3.4* 3.9  CL 100 108 105 100  CO2 26 14* 12* 35*  GLUCOSE 176* 219* 234* 294*  BUN 21 18 22  47*  CREATININE 0.91 1.45* 1.87* 3.17*  CALCIUM 9.7 6.4* 6.6* 6.0*  MG  --   --  1.4* 2.3  PHOS  --   --  9.1* 5.4*    Liver Function Tests: Recent Labs  Lab 07/02/20 0346 07/02/20 0753  AST 554* 849*  ALT 524* 777*  ALKPHOS 42 46  BILITOT 0.8 0.6  PROT 4.0* 3.9*  ALBUMIN 2.6* 2.4*   No results for input(s): LIPASE, AMYLASE in the last 168 hours. No results for input(s): AMMONIA in the last 168 hours.  CBC: Recent Labs  Lab 06/30/2020 0741 07/13/2020 1507 07/11/2020 1807 07/08/2020 2112 07/02/20 0145 07/02/20 0753 07/02/20 1519 07/02/20 2025 07/03/20 0405  WBC 13.3* 21.9* 24.6*  --   27.3*  --   --   --  20.9*  NEUTROABS 9.5*  --   --   --   --   --   --   --   --   HGB 13.7 8.3* 6.6*   < > 11.9* 11.5* 11.0* 9.9* 10.0*  HCT 41.1 24.7* 20.5*   < > 37.1* 34.1* 31.1* 27.9* 29.2*  MCV 85.6 87.6 91.5  --  82.6  --   --   --  79.3*  PLT 290 216 246  --  158  --   --   --  141*   < > = values in this interval not displayed.    Cardiac Enzymes: Recent Labs  Lab 07/02/20 0346 07/02/20 0753  CKTOTAL 114 178    BNP: Invalid input(s): POCBNP  CBG: Recent Labs  Lab 07/02/20 1117 07/02/20 1609 07/02/20 1927 07/02/20 2318 07/03/20 0316  GLUCAP 265* 335* 336* 205* 12*    Microbiology: Results for orders placed or performed during the hospital encounter of 06/26/2020  MRSA PCR Screening     Status: None   Collection Time: 06/29/2020  4:32 PM   Specimen: Nasopharyngeal  Result Value Ref Range Status   MRSA by PCR NEGATIVE NEGATIVE Final    Comment:        The GeneXpert MRSA Assay (FDA approved for NASAL specimens  only), is one component of a comprehensive MRSA colonization surveillance program. It is not intended to diagnose MRSA infection nor to guide or monitor treatment for MRSA infections. Performed at [facility], [address], [place], [place] [address]   CULTURE, BLOOD (ROUTINE X 2) w Reflex to ID Panel     Status: None (Preliminary result)   Collection Time: 06/26/2020  6:07 PM   Specimen: BLOOD  Result Value Ref Range Status   Specimen Description BLOOD BLOOD RIGHT HAND  Final   Special Requests   Final    BOTTLES DRAWN AEROBIC AND ANAEROBIC Blood Culture results may not be optimal due to an inadequate volume of blood received in culture bottles   Culture   Final    NO GROWTH < 12 HOURS Performed at [facility], [address], [place], [place] [address]    Report Status PENDING  Incomplete  CULTURE, BLOOD (ROUTINE X 2) w Reflex to ID Panel     Status: None (Preliminary result)   Collection Time: 06/16/2020  6:07 PM    Specimen: BLOOD  Result Value Ref Range Status   Specimen Description BLOOD LEFT THUMB  Final   Special Requests   Final    BOTTLES DRAWN AEROBIC AND ANAEROBIC Blood Culture adequate volume   Culture   Final    NO GROWTH < 12 HOURS Performed at [facility], [address], [place], [place] [address]    Report Status PENDING  Incomplete    Coagulation Studies: Recent Labs    07/04/2020 0741  LABPROT 13.2  INR 1.0    Urinalysis: Recent Labs    07/02/20 0301  COLORURINE YELLOW*  LABSPEC 1.013  PHURINE 5.0  GLUCOSEU >=500*  HGBUR LARGE*  BILIRUBINUR NEGATIVE  KETONESUR 20*  PROTEINUR 30*  NITRITE NEGATIVE  LEUKOCYTESUR NEGATIVE      Imaging: US RENAL  Result Date: 07/02/2020 CLINICAL DATA:  Acute renal failure, history diabetes mellitus, coronary artery disease post MI, peripheral vascular disease, hypertension, hyperlipidemia CT, former smoker EXAM: RENAL / URINARY TRACT ULTRASOUND COMPLETE COMPARISON:  CT angio abdomen and pelvis 06/25/2020 FINDINGS: Right Kidney: Renal measurements: 12.6 x 6.1 x 6.2 cm = volume: 251 mL. Normal cortical thickness. Borderline increased cortical echogenicity. No mass or hydronephrosis. No shadowing calcification. Minimal perinephric fluid. Left Kidney: Renal measurements: 13.3 x 7.1 x 6.2 cm = volume: 307 mL. Normal cortical thickness. Borderline increased cortical echogenicity. No mass, hydronephrosis, or shadowing calcification. Bladder: Decompressed by Foley catheter, unable to evaluate. Other: Minimal ascites.  Question fatty infiltration of liver. IMPRESSION: Question medical renal disease changes of both kidneys. No evidence of renal mass or hydronephrosis. Minimal ascites. Electronically Signed   By: Lavonia Dana M.D.   On: 07/02/2020 10:30   DG Chest Port 1 View  Result Date: 07/02/2020 CLINICAL DATA:  Encounter for central line placement EXAM: PORTABLE CHEST 1 VIEW COMPARISON:  07/08/2020 FINDINGS: Normal heart size and  mediastinal contours. Mild indistinct opacity at the bases. Lung volumes are low. Bilateral central line with new left-sided line tip at the upper SVC. No pneumothorax. The enteric tube loops at the stomach. The endotracheal tube tip is just below the clavicular heads. IMPRESSION: 1. New left IJ line without complicating feature. 2. Mild atelectatic opacity at the bases. Electronically Signed   By: Monte Fantasia M.D.   On: 07/02/2020 06:32   DG Chest Port 1 View  Result Date: 06/30/2020 CLINICAL DATA:  Central line placement, postop EXAM: PORTABLE CHEST 1 VIEW  COMPARISON:  06/25/2020 FINDINGS: Single frontal view of the chest demonstrates endotracheal tube overlying tracheal air column tip at level of thoracic inlet. Enteric catheter passes below diaphragm tip and side port projecting over gastric fundus. Esophageal temperature probe overlies the lower thoracic esophagus. Right internal jugular central venous catheter overlies superior vena cava. Cardiac silhouette is unremarkable. No airspace disease, effusion, or pneumothorax. No acute bony abnormalities. IMPRESSION: 1. Support devices as above.  No evidence of malpositioning. 2. No acute intrathoracic process. Electronically Signed   By: Randa Ngo M.D.   On: 07/13/2020 20:25   DG C-Arm 1-60 Min-No Report  Result Date: 06/27/2020 Fluoroscopy was utilized by the requesting physician.  No radiographic interpretation.     Medications:   . sodium chloride 10 mL/hr at 07/03/2020 0809  . sodium chloride    . ampicillin-sulbactam (UNASYN) IV Stopped (07/03/20 0404)  . anticoagulant sodium citrate    . dexmedetomidine (PRECEDEX) IV infusion 0.7 mcg/kg/hr (07/03/20 3614)  . esmolol    . famotidine (PEPCID) IV Stopped (07/02/20 2146)  . fentaNYL infusion INTRAVENOUS 325 mcg/hr (07/03/20 4315)  . magnesium sulfate bolus IVPB    . nitroGLYCERIN    . norepinephrine (LEVOPHED) Adult infusion Stopped (07/02/20 1146)  . phenylephrine (NEO-SYNEPHRINE)  Adult infusion 100 mcg/min (07/03/20 4008)  . vasopressin 0.04 Units/min (07/03/20 6761)   . sodium chloride   Intravenous Once  . aspirin EC  81 mg Oral Q0600  . atorvastatin  40 mg Oral Daily  . chlorhexidine gluconate (MEDLINE KIT)  15 mL Mouth Rinse BID  . Chlorhexidine Gluconate Cloth  6 each Topical Once  . collagenase  1 application Topical Daily  . docusate sodium  100 mg Oral Daily  . hydrocortisone sod succinate (SOLU-CORTEF) inj  50 mg Intravenous Q6H  . insulin aspart  0-20 Units Subcutaneous Q4H  . mouth rinse  15 mL Mouth Rinse 10 times per day  . potassium chloride  40 mEq Per Tube BID   sodium chloride, acetaminophen **OR** acetaminophen, alum & mag hydroxide-simeth, anticoagulant sodium citrate, guaiFENesin-dextromethorphan, hydrALAZINE, HYDROmorphone (DILAUDID) injection, labetalol, magnesium sulfate bolus IVPB, metoprolol tartrate, midazolam, morphine injection, ondansetron (ZOFRAN) IV, ondansetron, oxyCODONE-acetaminophen, phenol, potassium chloride, senna-docusate, sorbitol  Assessment/ Plan:  Ivan Larsen is a 65 y.o. white male with peripheral vascular disease, coronary artery disease, nephrolithiasis, hyperlipidemia, diabetes mellitus type II, who was admitted to [facility] on 06/21/2020 for Atherosclerosis of artery of extremity with ulceration ([place]) [I70.299, P50.932] S/P aorto-bifemoral bypass surgery [I71.245] Underwent bilateral femoral artery bypass graft by Dr. Lucky Cowboy and Dr. Delana Meyer.  1. Acute renal failure with hypernatremia and metabolic acidosis: with baseline creatinine of 0.9 with normal GFR on 06/25/20.  Acidosis has reversed with bicarbonate infusion.  History of glycosuria and underlying diabetic kidney disease.  Nonoliguric urine output.  - Discontinue bicarbonate infusion. Start 1/2NS infusion - No indication for dialysis at this time.   2. Hypotension: with acute blood loss - circulatory shock. Requiring vasopressors: phenylephrine and  vasopressin.  - stress dose steroids - start midodrine  3. Hypokalemia - potassium chloride   LOS: 2 Micheal Sheen 2/18/20227:16 AM

## 2020-07-04 ENCOUNTER — Inpatient Hospital Stay: Payer: Medicare Other

## 2020-07-04 DIAGNOSIS — Z95828 Presence of other vascular implants and grafts: Secondary | ICD-10-CM | POA: Diagnosis not present

## 2020-07-04 DIAGNOSIS — R578 Other shock: Secondary | ICD-10-CM | POA: Diagnosis not present

## 2020-07-04 LAB — CBC
HCT: 26.6 % — ABNORMAL LOW (ref 39.0–52.0)
Hemoglobin: 8.6 g/dL — ABNORMAL LOW (ref 13.0–17.0)
MCH: 26.8 pg (ref 26.0–34.0)
MCHC: 32.3 g/dL (ref 30.0–36.0)
MCV: 82.9 fL (ref 80.0–100.0)
Platelets: 120 10*3/uL — ABNORMAL LOW (ref 150–400)
RBC: 3.21 MIL/uL — ABNORMAL LOW (ref 4.22–5.81)
RDW: 25.2 % — ABNORMAL HIGH (ref 11.5–15.5)
WBC: 25.2 10*3/uL — ABNORMAL HIGH (ref 4.0–10.5)
nRBC: 2.9 % — ABNORMAL HIGH (ref 0.0–0.2)

## 2020-07-04 LAB — BLOOD GAS, ARTERIAL
Acid-Base Excess: 8.3 mmol/L — ABNORMAL HIGH (ref 0.0–2.0)
Bicarbonate: 31.8 mmol/L — ABNORMAL HIGH (ref 20.0–28.0)
FIO2: 28
MECHVT: 500 mL
Mechanical Rate: 14
O2 Saturation: 92.6 %
PEEP: 5 cmH2O
Patient temperature: 37
pCO2 arterial: 39 mmHg (ref 32.0–48.0)
pH, Arterial: 7.52 — ABNORMAL HIGH (ref 7.350–7.450)
pO2, Arterial: 58 mmHg — ABNORMAL LOW (ref 83.0–108.0)

## 2020-07-04 LAB — ECHOCARDIOGRAM COMPLETE
AV Mean grad: 2 mmHg
AV Peak grad: 3.9 mmHg
Ao pk vel: 0.99 m/s
Area-P 1/2: 3.31 cm2
Calc EF: 39.6 %
Height: 65 in
Single Plane A2C EF: 36.3 %
Single Plane A4C EF: 38.7 %
Weight: 2335.11 oz

## 2020-07-04 LAB — BPAM RBC
Blood Product Expiration Date: 202203142359
Blood Product Expiration Date: 202203142359
Blood Product Expiration Date: 202203172359
Blood Product Expiration Date: 202203172359
Blood Product Expiration Date: 202203172359
ISSUE DATE / TIME: 202202161922
ISSUE DATE / TIME: 202202162207
ISSUE DATE / TIME: 202202162310
Unit Type and Rh: 6200
Unit Type and Rh: 6200
Unit Type and Rh: 6200
Unit Type and Rh: 6200
Unit Type and Rh: 6200

## 2020-07-04 LAB — TYPE AND SCREEN
ABO/RH(D): A POS
Antibody Screen: NEGATIVE
Unit division: 0
Unit division: 0
Unit division: 0
Unit division: 0
Unit division: 0

## 2020-07-04 LAB — BASIC METABOLIC PANEL
Anion gap: 14 (ref 5–15)
BUN: 63 mg/dL — ABNORMAL HIGH (ref 8–23)
CO2: 32 mmol/L (ref 22–32)
Calcium: 6 mg/dL — CL (ref 8.9–10.3)
Chloride: 101 mmol/L (ref 98–111)
Creatinine, Ser: 4.25 mg/dL — ABNORMAL HIGH (ref 0.61–1.24)
GFR, Estimated: 15 mL/min — ABNORMAL LOW (ref >60)
Glucose, Bld: 112 mg/dL — ABNORMAL HIGH (ref 70–99)
Potassium: 4.4 mmol/L (ref 3.5–5.1)
Sodium: 147 mmol/L — ABNORMAL HIGH (ref 135–145)

## 2020-07-04 LAB — GLUCOSE, CAPILLARY
Glucose-Capillary: 104 mg/dL — ABNORMAL HIGH (ref 70–99)
Glucose-Capillary: 109 mg/dL — ABNORMAL HIGH (ref 70–99)
Glucose-Capillary: 111 mg/dL — ABNORMAL HIGH (ref 70–99)
Glucose-Capillary: 113 mg/dL — ABNORMAL HIGH (ref 70–99)
Glucose-Capillary: 128 mg/dL — ABNORMAL HIGH (ref 70–99)
Glucose-Capillary: 99 mg/dL (ref 70–99)

## 2020-07-04 LAB — MAGNESIUM: Magnesium: 2.1 mg/dL (ref 1.7–2.4)

## 2020-07-04 LAB — HEPATIC FUNCTION PANEL
ALT: 222 U/L — ABNORMAL HIGH (ref 0–44)
AST: 491 U/L — ABNORMAL HIGH (ref 15–41)
Albumin: 1.9 g/dL — ABNORMAL LOW (ref 3.5–5.0)
Alkaline Phosphatase: 87 U/L (ref 38–126)
Bilirubin, Direct: 0.1 mg/dL (ref 0.0–0.2)
Indirect Bilirubin: 0.5 mg/dL (ref 0.3–0.9)
Total Bilirubin: 0.6 mg/dL (ref 0.3–1.2)
Total Protein: 3.9 g/dL — ABNORMAL LOW (ref 6.5–8.1)

## 2020-07-04 LAB — HEMOGLOBIN AND HEMATOCRIT, BLOOD
HCT: 25 % — ABNORMAL LOW (ref 39.0–52.0)
Hemoglobin: 8.3 g/dL — ABNORMAL LOW (ref 13.0–17.0)

## 2020-07-04 LAB — HEMOGLOBIN A1C
Hgb A1c MFr Bld: 5.7 % — ABNORMAL HIGH (ref 4.8–5.6)
Mean Plasma Glucose: 117 mg/dL

## 2020-07-04 LAB — PROCALCITONIN: Procalcitonin: 8.85 ng/mL

## 2020-07-04 LAB — PHOSPHORUS: Phosphorus: 6 mg/dL — ABNORMAL HIGH (ref 2.5–4.6)

## 2020-07-04 LAB — PREPARE RBC (CROSSMATCH)

## 2020-07-04 IMAGING — DX DG CHEST 1V PORT
1 series · 1 of 1 positions shown · non-contrast
Comparison: [DATE]

CLINICAL DATA: Acute respiratory failure with hypoxia

EXAM:
PORTABLE CHEST 1 VIEW

[chest ap]
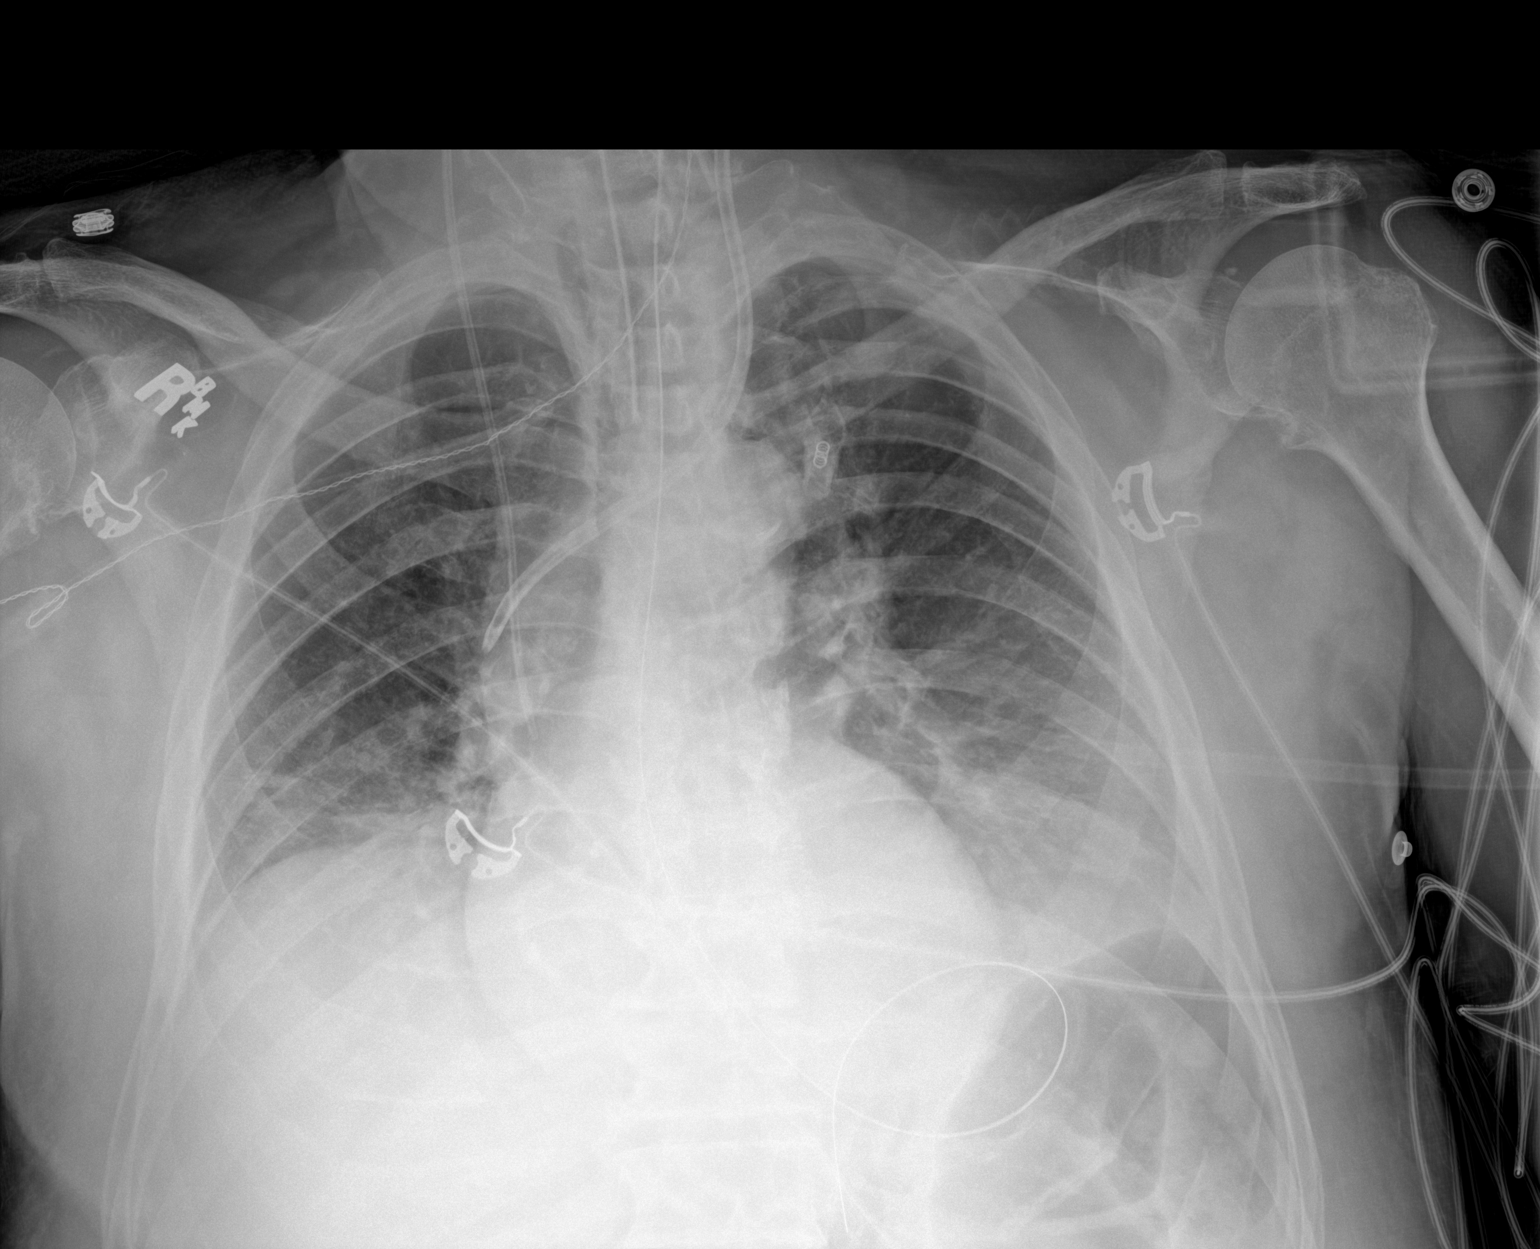

[1 of 1 positions shown; findings below may reference images not displayed]

FINDINGS: *Endotracheal tube tip terminates in the mid to lower trachea,
cm from the expected location of the carina.
*Transesophageal tube coils in the left upper quadrant prior to
terminating in the region of the gastric body.
*Transesophageal temperature probe terminates in the cervical
esophagus
*Left IJ approach central venous catheter tip terminates in the
lower SVC.
*Right IJ approach venous catheter tip terminates at the superior
cavoatrial junction.
*Telemetry leads and external support devices overlie the chest.

Streaky opacities in the lung bases, possibly atelectatic though
airspace disease or edema could have a similar appearance
particularly in the left lung base. Partial obscuration of the left
hemidiaphragm may reflect some layering effusion or basilar
consolidation. No right effusion. No pneumothorax. Stable
cardiomediastinal contours. No acute osseous or soft tissue
abnormality. Degenerative changes are present in the imaged spine
and shoulders.
IMPRESSION: 1. Lines and tubes as above.
2. Increasing streaky opacities in the lung bases, possibly
atelectatic though airspace disease or edema could have a similar
appearance particularly given some more hazy opacity in the left
lung base partially obscuring the left hemidiaphragm.

## 2020-07-04 MED ORDER — ALBUMIN HUMAN 25 % IV SOLN
12.5000 g | Freq: Once | INTRAVENOUS | Status: AC
  Administered 2020-07-04: 12.5 g via INTRAVENOUS
  Filled 2020-07-04: qty 50

## 2020-07-04 MED ORDER — SODIUM CHLORIDE 0.9 % IV SOLN
10.0000 mg | INTRAVENOUS | Status: DC
  Administered 2020-07-05 – 2020-07-07 (×3): 10 mg via INTRAVENOUS
  Filled 2020-07-04 (×3): qty 1

## 2020-07-04 MED ORDER — CALCIUM CARBONATE ANTACID 1250 MG/5ML PO SUSP
500.0000 mg | Freq: Two times a day (BID) | ORAL | Status: AC
  Administered 2020-07-04 – 2020-07-05 (×4): 500 mg
  Filled 2020-07-04 (×5): qty 5

## 2020-07-04 MED ORDER — MIDODRINE HCL 5 MG PO TABS
10.0000 mg | ORAL_TABLET | Freq: Three times a day (TID) | ORAL | Status: DC
  Administered 2020-07-04 – 2020-07-09 (×13): 10 mg
  Filled 2020-07-04 (×15): qty 2

## 2020-07-04 NOTE — Progress Notes (Signed)
Patient extubated to 2l [place], per md request, with no complications.

## 2020-07-04 NOTE — Progress Notes (Signed)
NAME:  Ivan Larsen, MRN:  [id number], DOB:  12-30-1955, LOS: 3 ADMISSION DATE:  06/04/20, INITIAL CONSULTATION DATE:  06/04/20 REFERRING MD:  Dr. Wyn Quakerew, CHIEF COMPLAINT:  Hemorrhagic shock   Brief History:  65 yo male with hx of severe peripheral vascular disease, dm, dyslipidemia who underwent elective Aortobifemoral bypass on 2020-05-24. Intraop he lost apx 2L blood with 1/2 returned via blood saver, received IVF fluids.  After surgery patient was in circulatory shock and was emergently brought to MICU due to medical instability. He required vasopressor support perioperatively.        Past Medical History:  Peripheral artery disease Myocardial infarction Hyperlipidemia Kidney stones Elevated liver enzyme Diabetes mellitus Coronary artery disease  Significant Hospital Events:  2/16: Underwent elective aortobifemoral bypass; intraoperative blood loss with hemorrhagic shock: Transferred to ICU post procedure remained intubated 2/17: AKI, nephrology consulted, temporary dialysis catheter placed  Consults:  Vascular surgery Nephrology PCCM   Procedures:  2/16: Aortofemoral bypass 2/16: Right IJ CVC placed 2/17: Left IJ temporary dialysis catheter placed  Significant Diagnostic Tests:  2/17: Renal ultrasound>Question medical renal disease changes of both kidneys. No evidence of renal mass or hydronephrosis.Minimal ascites. 2/18: KUB>>Gas throughout mildly prominent large and small bowel loops may reflect mild ileus. NG tube in the stomach. 2/18: Echocardiogram>>LVEF 50-55% no wall motion abnl.  Micro Data:  2/14: SARS-CoV-2 PCR>> negative 2/16: MRSA PCR>> negative 2/16: Blood culture x2>> 2/18: Tracheal aspirate>>  Antimicrobials:  Cefazolin 2/16>> 2/17 (surgical prophylaxis). Unasyn 2/18>>  Interim History / Subjective:  Tolerating SBT, follows commands   Objective   Blood pressure 112/62, pulse 74, temperature 98.96 F (37.2 C), resp. rate 14, height 5\' 5"   (1.651 m), weight 66.2 kg, SpO2 96 %.    Vent Mode: PCV FiO2 (%):  [28 %] 28 % Set Rate:  [14 bmp] 14 bmp Vt Set:  [500 mL] 500 mL PEEP:  [5 cmH20] 5 cmH20 Pressure Support:  [5 cmH20] 5 cmH20 Plateau Pressure:  [10 cmH20-18 cmH20] 10 cmH20   Intake/Output Summary (Last 24 hours) at 07/04/2020 [id number] Last data filed at 07/04/2020 [id number] Gross per 24 hour  Intake 3298.23 ml  Output 640 ml  Net 2658.23 ml   Filed Weights   02022-01-09 0801 02022-01-09 1630  Weight: 63.5 kg 66.2 kg    Examination: General: Critically ill-appearing male, laying in bed, intubated,SAT: follows commands, no acute distress HENT: Atraumatic, normocephalic, neck supple, no JVD, ET tube in place Lungs: Clear to auscultation bilaterally, synchronous with the ventilator, even Cardiovascular: Regular rate and rhythm, S1-S2, no murmurs, rubs, gallops Abdomen: Distended, tender, bowel sounds positive x4, midline abdominal honeycomb dressing clean dry and intact Extremities: No deformities, 3+ edema bilateral lower extremities GU: scrotal edema, ecchymotic,foley in place Neuro: Sedated, withdraws from pain, follows commands off sedation, pupils [facility]     Resolved Hospital Problem list   N/A  Assessment & Plan:   Post-op Respiratory in the setting of Shock and Metabolic Derangements -On SBT, tolerating. -Wean FiO2 and PEEP as tolerated to maintain O2 sats greater than 92% -Follow intermittent chest x-ray and ABG as needed -VAP bundle implemented -Spontaneous breathing trials today, plan to extubate if meets criteria  Hemorrhagic shock +/-septic shock -Continuous cardiac monitoring -Maintain MAP greater than 65 -IV fluids -Blood transfusions as indicated -Vasopressors if needed weaned off -Stress dose steroids, wean -Continue Midodrine -2D echocardiogram 50-55% LVEF   Questionable pneumonia -Monitor fever -Curve trend WBCs and procalcitonin -Follow cultures as above ~will obtain tracheal aspirate today  2/18 -Continue empiric Unasyn for now   Acute Blood Loss Anemia -Monitor for S/Sx of bleeding -Trend CBC (H&H q6h) -SCD's for VTE Prophylaxis (can start chemical prophylaxis when cleared by surgery) -Transfuse for Hgb <8   AKI Metabolic alkalosis Hypernatremia -Monitor I&O's / urinary output -Follow BMP -Ensure adequate renal perfusion -Avoid nephrotoxic agents as able -Replace electrolytes as indicated -Nephrology following, appreciate input -Discontinuing bicarb drip, placing on 1/2 normal saline -will likely need dialysis   Severe Peripheral Vascular Disease s/p Aortobifemoral bypass -Vascular Surgery following, will follow recommendations -Discussed with Dr. Evie Lacks -Pain control   Hyperglycemia CBG's SSI Follow ICU Hypo/Hyperglycemia protocol   Best practice (evaluated daily)  Diet: NPO Pain/Anxiety/Delirium protocol (if indicated): Fentanyl/Precedex VAP protocol (if indicated): yes, implemented DVT prophylaxis: SCD's GI prophylaxis: Pepcid Glucose control: SSI, Levemir Mobility: Bedrest Disposition:ICU  Goals of Care:  Last date of multidisciplinary goals of care discussion:07/03/2020 Family and staff present: Bedside RN and pts daughter at bedside Summary of discussion: Plan for SBT today, wean vasopressors as able, continue ABX for potential PNA Follow up goals of care discussion due: 07/04/2020 Code Status: FULL CODE  Labs   CBC: Recent Labs  Lab 07/05/2020 0741 07/03/2020 1507 06/16/2020 1807 07/10/2020 2112 07/02/20 0145 07/02/20 0753 07/03/20 0405 07/03/20 0902 07/03/20 1511 07/03/20 2208 07/04/20 0419  WBC 13.3* 21.9* 24.6*  --  27.3*  --  20.9*  --   --   --  25.2*  NEUTROABS 9.5*  --   --   --   --   --   --   --   --   --   --   HGB 13.7 8.3* 6.6*   < > 11.9*   < > 10.0* 9.9* 9.4* 8.9* 8.6*  HCT 41.1 24.7* 20.5*   < > 37.1*   < > 29.2* 29.8* 27.2* 27.6* 26.6*  MCV 85.6 87.6 91.5  --  82.6  --  79.3*  --   --   --  82.9  PLT 290 216 246  --   158  --  141*  --   --   --  120*   < > = values in this interval not displayed.    Basic Metabolic Panel: Recent Labs  Lab 06/16/2020 0741 06/26/2020 2020 07/02/20 0346 07/03/20 0405 07/04/20 0419  NA 140 146* 150* 149* 147*  K 3.8 3.4* 3.4* 3.9 4.4  CL 100 108 105 100 101  CO2 26 14* 12* 35* 32  GLUCOSE 176* 219* 234* 294* 112*  BUN 47* 63*  CREATININE 0.91 1.45* 1.87* 3.17* 4.25*  CALCIUM 9.7 6.4* 6.6* 6.0* 6.0*  MG  --   --  1.4* 2.3 2.1  PHOS  --   --  9.1* 5.4* 6.0*   GFR: Estimated Creatinine Clearance: 15.1 mL/min (A) (by C-G formula based on SCr of 4.25 mg/dL (H)). Recent Labs  Lab 06/26/2020 1807 07/07/2020 2020 06/16/2020 2301 07/02/20 0145 07/02/20 0346 07/02/20 0502 07/03/20 0405 07/04/20 0419  PROCALCITON  --  0.12  --   --  1.30  --  3.68 8.85  WBC 24.6*  --   --  27.3*  --   --  20.9* 25.2*  LATICACIDVEN  --  >11.0* >11.0*  --   --  >11.0*  --   --     Liver Function Tests: Recent Labs  Lab 07/02/20 0346 07/02/20 0753 07/03/20 0405  AST 554* 849* 1,152*  ALT 524* 777* 581*  ALKPHOS 42 46  66  BILITOT 0.8 0.6 0.6  PROT 4.0* 3.9* 3.8*  ALBUMIN 2.6* 2.4* 2.2*   No results for input(s): LIPASE, AMYLASE in the last 168 hours. No results for input(s): AMMONIA in the last 168 hours.  ABG    Component Value Date/Time   PHART 7.52 (H) 07/04/2020 0344   PCO2ART 39 07/04/2020 0344   PO2ART 58 (L) 07/04/2020 0344   HCO3 31.8 (H) 07/04/2020 0344   ACIDBASEDEF 14.7 (H) 07/02/2020 0555   O2SAT 92.6 07/04/2020 0344     Coagulation Profile: Recent Labs  Lab 07/02/2020 0741  INR 1.0    Cardiac Enzymes: Recent Labs  Lab 07/02/20 0346 07/02/20 0753  CKTOTAL 114 178    HbA1C: Hemoglobin A1C  Date/Time Value Ref Range Status  12/22/2015 12:00 AM 7.3  Final   HB A1C (BAYER DCA - WAIVED)  Date/Time Value Ref Range Status  06/02/2020 01:44 PM 6.3 <7.0 % Final    Comment:                                          Diabetic Adult             <7.0                                       Healthy Adult        4.3 - 5.7                                                           (DCCT/NGSP) American Diabetes Association's Summary of Glycemic Recommendations for Adults with Diabetes: Hemoglobin A1c <7.0%. More stringent glycemic goals (A1c <6.0%) may further reduce complications at the cost of increased risk of hypoglycemia.   02/07/2020 08:07 AM 6.4 <7.0 % Final    Comment:                                          Diabetic Adult            <7.0                                       Healthy Adult        4.3 - 5.7                                                           (DCCT/NGSP) American Diabetes Association's Summary of Glycemic Recommendations for Adults with Diabetes: Hemoglobin A1c <7.0%. More stringent glycemic goals (A1c <6.0%) may further reduce complications at the cost of increased risk of hypoglycemia.    Hgb A1c MFr Bld  Date/Time Value Ref Range Status  07/03/2020 04:05 AM 5.7 (H) 4.8 - 5.6 % Final    Comment:    (  NOTE)         Prediabetes: 5.7 - 6.4         Diabetes: >6.4         Glycemic control for adults with diabetes: <7.0   07/02/2020 03:46 AM 5.7 (H) 4.8 - 5.6 % Final    Comment:    (NOTE)         Prediabetes: 5.7 - 6.4         Diabetes: >6.4         Glycemic control for adults with diabetes: <7.0     CBG: Recent Labs  Lab 07/03/20 1551 07/03/20 1925 07/03/20 2313 07/04/20 0341 07/04/20 0735  GLUCAP 146* 135* 123* 128* 99    Review of Systems:   Unable to assess due to intubation and sedation  Past Medical History:  He,  has a past medical history of CAD (coronary artery disease), Diabetes mellitus without complication (HCC), Elevated liver enzymes, History of kidney stones, Hyperlipidemia, MI (myocardial infarction) (HCC) (07/01/12), and PAD (peripheral artery disease) (HCC).   Surgical History:   Past Surgical History:  Procedure Laterality Date  . AORTA - BILATERAL FEMORAL ARTERY  BYPASS GRAFT N/A 08/05/2020   Procedure: AORTA BIFEMORAL BYPASS GRAFT (VERSUS BILATERAL FEMORAL ENDARTERECTOMIES);  Surgeon: Annice Needy, MD;  Location: [facility] ORS;  Service: Vascular;  Laterality: N/A;  . CORONARY STENT PLACEMENT    . EYE SURGERY    . LOWER EXTREMITY ANGIOGRAPHY Left 06/25/2020   Procedure: LOWER EXTREMITY ANGIOGRAPHY;  Surgeon: Annice Needy, MD;  Location: [facility] INVASIVE CV LAB;  Service: Cardiovascular;  Laterality: Left;     Social History:   reports that he quit smoking about 16 years ago. His smoking use included cigarettes. He has never used smokeless tobacco. He reports current alcohol use. He reports that he does not use drugs.   Family History:  His family history includes Diabetes in his mother; Heart attack in his father.   Allergies Allergies  Allergen Reactions  . Iodinated Diagnostic Agents Nausea Only    Other reaction(s): Vomiting  . Iodine   . Shellfish Allergy Nausea And Vomiting and Other (See Comments)    Sweating  Sweating    . Shellfish-Derived Products Nausea And Vomiting and Other (See Comments)    Other reaction(s): Nausea And Vomiting, Other (See Comments) Sweating  Sweating      Home Medications  Prior to Admission medications   Medication Sig Start Date End Date Taking? Authorizing Provider  amLODipine (NORVASC) 10 MG tablet Take 1 tablet (10 mg total) by mouth daily. 02/17/20  Yes Bacigalupo, Marzella Schlein, MD  atorvastatin (LIPITOR) 40 MG tablet Take 1 tablet (40 mg total) by mouth daily. 02/17/20  Yes Bacigalupo, Marzella Schlein, MD  benazepril (LOTENSIN) 40 MG tablet Take 1 tablet (40 mg total) by mouth daily. 02/17/20  Yes Bacigalupo, Marzella Schlein, MD  collagenase (SANTYL) ointment Apply 1 application topically daily. 05/05/20  Yes Cannady, Corrie Dandy T, NP  empagliflozin (JARDIANCE) 25 MG TABS tablet Take 1 tablet (25 mg total) by mouth daily before breakfast. 03/20/20  Yes Cathlean Marseilles A, NP  gabapentin (NEURONTIN) 300 MG capsule Take 1 capsule (300  mg total) by mouth 3 (three) times daily. 05/05/20  Yes Cannady, Jolene T, NP  hydrochlorothiazide (HYDRODIURIL) 25 MG tablet Take 1 tablet (25 mg total) by mouth daily. 03/20/20  Yes Valentino Nose, NP  HYDROcodone-acetaminophen (NORCO) 5-325 MG tablet Take 1 tablet by mouth every 6 (six) hours as needed for moderate pain. 06/25/20  Yes Annice Needy, MD  meclizine (ANTIVERT) 25 MG tablet Take 1 tablet (25 mg total) by mouth 3 (three) times daily as needed for dizziness or nausea. 08/07/19  Yes Particia Nearing, PA-C  metoprolol tartrate (LOPRESSOR) 50 MG tablet Take 1 tablet (50 mg total) by mouth 2 (two) times daily. 02/13/20  Yes Valentino Nose, NP  mupirocin ointment (BACTROBAN) 2 % Apply 1 application topically 2 (two) times daily. 06/02/20  Yes Cannady, Jolene T, NP  Omega-3 Fatty Acids (FISH OIL OMEGA-3) 1000 MG CAPS Take by mouth 2 (two) times daily.   Yes [provider]  ondansetron (ZOFRAN-ODT) 4 MG disintegrating tablet 1 TABLET ORALLY DISSOLVED EVERY 8 HOURS IF NEEDED FOR NAUSEA AND VOMITING Patient taking differently: Take 4 mg by mouth every 8 (eight) hours as needed for nausea or vomiting. 12/27/19  Yes Cathlean Marseilles A, NP  sitaGLIPtin (JANUVIA) 25 MG tablet Take 1 tablet (25 mg total) by mouth daily. 03/20/20  Yes Valentino Nose, NP  tamsulosin (FLOMAX) 0.4 MG CAPS capsule Take 1 capsule (0.4 mg total) by mouth at bedtime. Take at nighttime to minimize dizziness/hypotension 03/20/20  Yes Cathlean Marseilles A, NP  clopidogrel (PLAVIX) 75 MG tablet Take 1 tablet (75 mg total) by mouth daily. Patient not taking: No sig reported 02/13/20   Valentino Nose, NP  doxycycline (VIBRA-TABS) 100 MG tablet Take 1 tablet (100 mg total) by mouth 2 (two) times daily. Patient not taking: No sig reported 06/02/20   Aura Dials T, NP  metFORMIN (GLUCOPHAGE) 500 MG tablet Take 2 tablets (1,000 mg total) by mouth 2 (two) times daily with a meal. 03/20/20   Valentino Nose, NP     Critical care time: 40 minutes    The patient is critically ill with multiple organ systems failure and requires high complexity decision making for assessment and support, frequent evaluation and titration of therapies, application of advanced monitoring technologies and extensive interpretation of multiple databases. Critical Care Time devoted to patient care services described in this note is 40 minutes.   Gailen Shelter, MD [facility]  *This note was dictated using voice recognition software/Dragon.  Despite best efforts to proofread, errors can occur which can change the meaning.  Any change was purely unintentional.

## 2020-07-04 NOTE — Progress Notes (Signed)
3 Days Post-Op   Subjective/Chief Complaint: Patient remains intubated and sedated.  Spontaneous breathing trial yesterday however unable to extubate.  Remains on norepinephrine and vasopressin.  No evidence of bleeding.  His acidosis is slowly improving and there is evidence of moderately worsening renal function however per nephrology there is no indication for hemodialysis at this time.  His daughter is at the bedside and is aware.  Objective: Vital signs in last 24 hours: Temp:  [98.6 F (37 C)-100.58 F (38.1 C)] 98.96 F (37.2 C) (02/19 0830) Pulse Rate:  [48-96] 74 (02/19 0830) Resp:  [11-23] 14 (02/19 0830) BP: (66-131)/(43-75) 112/62 (02/19 0830) SpO2:  [89 %-100 %] 96 % (02/19 0847) FiO2 (%):  [28 %] 28 % (02/19 0806)    Intake/Output from previous day: 02/18 0701 - 02/19 0700 In: 3298.2 [I.V.:2858.2; NG/GT:90; IV Piggyback:350] Out: 640 [Urine:340; Emesis/NG output:300] Intake/Output this shift: No intake/output data recorded.  General appearance: Intubated sedated Resp: clear to auscultation bilaterally Cardio: regular rate and rhythm GI: Soft, nondistended, incision clean dry and intact.  Bilateral groin incisions are also clean dry and intact. Extremities: The legs are warm however the toes are slightly cool however there is good capillary refill.  Lab Results:  Recent Labs    07/03/20 0405 07/03/20 0902 07/03/20 2208 07/04/20 0419  WBC 20.9*  --   --  25.2*  HGB 10.0*   < > 8.9* 8.6*  HCT 29.2*   < > 27.6* 26.6*  PLT 141*  --   --  120*   < > = values in this interval not displayed.   BMET Recent Labs    07/03/20 0405 07/04/20 0419  NA 149* 147*  K 3.9 4.4  CL 100 101  CO2 35* 32  GLUCOSE 294* 112*  BUN 47* 63*  CREATININE 3.17* 4.25*  CALCIUM 6.0* 6.0*   PT/INR No results for input(s): LABPROT, INR in the last 72 hours. ABG Recent Labs    07/03/20 0500 07/04/20 0344  PHART 7.61* 7.52*  HCO3 35.2* 31.8*    Studies/Results: DG Abd 1  View  Result Date: 07/03/2020 CLINICAL DATA:  Vomiting EXAM: ABDOMEN - 1 VIEW COMPARISON:  01/04/2006 FINDINGS: NG tube coils in the stomach. Gas within mildly prominent large and small bowel loops. No visible free air or organomegaly. IMPRESSION: Gas throughout mildly prominent large and small bowel loops may reflect mild ileus. NG tube in the stomach. Electronically Signed   By: Charlett NoseKevin  Dover M.D.   On: 07/03/2020 07:32   DG Chest Port 1 View  Result Date: 07/04/2020 CLINICAL DATA:  Acute respiratory failure with hypoxia EXAM: PORTABLE CHEST 1 VIEW COMPARISON:  07/02/2020 FINDINGS: *Endotracheal tube tip terminates in the mid to lower trachea, 2.6 cm from the expected location of the carina. *Transesophageal tube coils in the left upper quadrant prior to terminating in the region of the gastric body. *Transesophageal temperature probe terminates in the cervical esophagus *Left IJ approach central venous catheter tip terminates in the lower SVC. *Right IJ approach venous catheter tip terminates at the superior cavoatrial junction. *Telemetry leads and external support devices overlie the chest. Streaky opacities in the lung bases, possibly atelectatic though airspace disease or edema could have a similar appearance particularly in the left lung base. Partial obscuration of the left hemidiaphragm may reflect some layering effusion or basilar consolidation. No right effusion. No pneumothorax. Stable cardiomediastinal contours. No acute osseous or soft tissue abnormality. Degenerative changes are present in the imaged spine and shoulders. IMPRESSION:  1. Lines and tubes as above. 2. Increasing streaky opacities in the lung bases, possibly atelectatic though airspace disease or edema could have a similar appearance particularly given some more hazy opacity in the left lung base partially obscuring the left hemidiaphragm. Electronically Signed   By: Kreg Shropshire M.D.   On: 07/04/2020 02:12   ECHOCARDIOGRAM  COMPLETE  Result Date: 07/04/2020    ECHOCARDIOGRAM REPORT   Patient Name:   Ivan Larsen [facility] Date of Exam: 07/03/2020 Medical Rec #:  [id number]         Height:       65.0 in Accession #:    [id number]        Weight:       145.9 lb Date of Birth:  [birth year]         BSA:          1.730 m Patient Age:    64 years          BP:           94/62 mmHg Patient Gender: M                 HR:           63 bpm. Exam Location:  [facility] Procedure: 2D Echo, Color Doppler, Cardiac Doppler and Intracardiac            Opacification Agent Indications:     Shock  History:         Patient has no prior history of Echocardiogram examinations.                  CAD, PAD; Risk Factors:Dyslipidemia and Diabetes.  Sonographer:     Humphrey Rolls RDCS (AE) Referring Phys:  [id number] Judithe Modest Diagnosing Phys: Arnoldo Hooker MD  Sonographer Comments: Echo performed with patient supine and on artificial respirator, no parasternal window, suboptimal apical window and no subcostal window. Image acquisition challenging due to respiratory motion. IMPRESSIONS  1. Left ventricular ejection fraction, by estimation, is 50 to 55%. The left ventricle has low normal function. The left ventricle has no regional wall motion abnormalities. Left ventricular diastolic function could not be evaluated.  2. Right ventricular systolic function is normal. The right ventricular size is normal.  3. The mitral valve is normal in structure. Mild mitral valve regurgitation.  4. The aortic valve is normal in structure. Aortic valve regurgitation is not visualized. FINDINGS  Left Ventricle: Left ventricular ejection fraction, by estimation, is 50 to 55%. The left ventricle has low normal function. The left ventricle has no regional wall motion abnormalities. The left ventricular internal cavity size was normal in size. There is no left ventricular hypertrophy. Left ventricular diastolic function could not be evaluated. Right Ventricle: The right ventricular size is normal.  No increase in right ventricular wall thickness. Right ventricular systolic function is normal. Left Atrium: Left atrial size was normal in size. Right Atrium: Right atrial size was normal in size. Pericardium: There is no evidence of pericardial effusion. Mitral Valve: The mitral valve is normal in structure. Mild mitral valve regurgitation. MV peak gradient, 4.6 mmHg. The mean mitral valve gradient is 2.0 mmHg. Tricuspid Valve: The tricuspid valve is normal in structure. Tricuspid valve regurgitation is trivial. Aortic Valve: The aortic valve is normal in structure. Aortic valve regurgitation is not visualized. Aortic valve mean gradient measures 2.0 mmHg. Aortic valve peak gradient measures 3.9 mmHg. Pulmonic Valve: The pulmonic valve was normal in structure. Pulmonic valve regurgitation is  not visualized. Aorta: The aortic root and ascending aorta are structurally normal, with no evidence of dilitation. IAS/Shunts: No atrial level shunt detected by color flow Doppler.   LV Volumes (MOD) LV vol d, MOD A2C: 60.9 ml Diastology LV vol d, MOD A4C: 78.5 ml LV e' medial:    4.68 cm/s LV vol s, MOD A2C: 38.8 ml LV E/e' medial:  14.1 LV vol s, MOD A4C: 48.1 ml LV e' lateral:   13.60 cm/s LV SV MOD A2C:     22.1 ml LV E/e' lateral: 4.9 LV SV MOD A4C:     78.5 ml LV SV MOD BP:      27.9 ml RIGHT VENTRICLE RV Basal diam:  3.00 cm LEFT ATRIUM             Index       RIGHT ATRIUM          Index LA Vol (A2C):   18.8 ml 10.87 ml/m RA Area:     8.18 cm LA Vol (A4C):   24.2 ml 13.99 ml/m RA Volume:   13.60 ml 7.86 ml/m LA Biplane Vol: 22.6 ml 13.06 ml/m  AORTIC VALVE AV Vmax:           98.60 cm/s AV Vmean:          65.400 cm/s AV VTI:            0.154 m AV Peak Grad:      3.9 mmHg AV Mean Grad:      2.0 mmHg LVOT Vmax:         89.40 cm/s LVOT Vmean:        59.300 cm/s LVOT VTI:          0.133 m LVOT/AV VTI ratio: 0.86 MITRAL VALVE MV Area (PHT): 3.31 cm    SHUNTS MV Peak grad:  4.6 mmHg    Systemic VTI: 0.13 m MV Mean grad:   2.0 mmHg MV Vmax:       1.07 m/s MV Vmean:      60.9 cm/s MV Decel Time: 229 msec MV E velocity: 66.00 cm/s MV A velocity: 93.00 cm/s MV E/A ratio:  0.71 Arnoldo Hooker MD Electronically signed by Arnoldo Hooker MD Signature Date/Time: 07/04/2020/7:52:02 AM    Final     Anti-infectives: Anti-infectives (From admission, onward)   Start     Dose/Rate Route Frequency Ordered Stop   07/03/20 2200  Ampicillin-Sulbactam (UNASYN) 3 g in sodium chloride 0.9 % 100 mL IVPB        3 g 200 mL/hr over 30 Minutes Intravenous Every 12 hours 07/03/20 1230     07/03/20 0400  Ampicillin-Sulbactam (UNASYN) 3 g in sodium chloride 0.9 % 100 mL IVPB  Status:  Discontinued        3 g 200 mL/hr over 30 Minutes Intravenous Every 6 hours 07/03/20 0314 07/03/20 1230   06/25/2020 2200  ceFAZolin (ANCEF) IVPB 2g/100 mL premix        2 g 200 mL/hr over 30 Minutes Intravenous Every 8 hours 06/28/2020 1630 07/02/20 0716   07/10/2020 0702  ceFAZolin (ANCEF) 2-4 GM/100ML-% IVPB       Note to Pharmacy: Register, Karen   : cabinet override      06/22/2020 0702 07/03/2020 0916   07/11/2020 0600  ceFAZolin (ANCEF) IVPB 2g/100 mL premix        2 g 200 mL/hr over 30 Minutes Intravenous On call to O.R. 06/30/20 2354 07/06/2020 1340  Assessment/Plan: s/p Procedure(s): AORTA BIFEMORAL BYPASS GRAFT (VERSUS BILATERAL FEMORAL ENDARTERECTOMIES) (N/A) APPLICATION OF CELL SAVER (N/A) Postoperative day #3  Continue full supportive care. Attempt additional spontaneous breathing trial and weaning of vasopressors. Monitor renal function and urine output. Appreciate intensivist and nephrology input and care  LOS: 3 days    Bertram Denver 07/04/2020

## 2020-07-04 NOTE — Progress Notes (Signed)
[facility]  ROUNDING NOTE   Subjective:   Gen- Febrile overnight with T max 100.6 Neuro-sedated with precedex Pulm-vent dependent cv-pressors- levophed and vasopressin Gi-NG tube in place Renal-UOP is dropping which is concerning Daughter at bedside  Objective:  Vital signs in last 24 hours:  Temp:  [98.6 F (37 C)-100.58 F (38.1 C)] 98.96 F (37.2 C) (02/19 0830) Pulse Rate:  [48-96] 74 (02/19 0830) Resp:  [11-28] 14 (02/19 0830) BP: (66-131)/(43-75) 112/62 (02/19 0830) SpO2:  [89 %-100 %] 97 % (02/19 0830) FiO2 (%):  [28 %] 28 % (02/19 0806)  Weight change:  Filed Weights   06/28/2020 0801 06/17/2020 1630  Weight: 63.5 kg 66.2 kg    Intake/Output: I/O last 3 completed shifts: In: 7035.3 [I.V.:6445.4; NG/GT:90; IV Piggyback:499.9] Out: 1740 [Urine:940; Emesis/NG output:800]   Intake/Output this shift:  No intake/output data recorded.  Physical Exam: General: Critically ill  HEENT: ETT   Lungs:  Vent assisted FiO2 28%  Heart: Regular rate and rhythm  Abdomen:  +distended, dressings clean, decreased breath sounds  Extremities: +peripheral edema.  Neurologic: Intubated and sedated  Skin: No lesions  Access: Left IJ temp dialysis cath    Basic Metabolic Panel: Recent Labs  Lab 06/20/2020 0741 07/05/2020 2020 07/02/20 0346 07/03/20 0405 07/04/20 0419  NA 140 146* 150* 149* 147*  K 3.8 3.4* 3.4* 3.9 4.4  CL 100 108 105 100 101  CO2 26 14* 12* 35* 32  GLUCOSE 176* 219* 234* 294* 112*  BUN 21 18 22  47* 63*  CREATININE 0.91 1.45* 1.87* 3.17* 4.25*  CALCIUM 9.7 6.4* 6.6* 6.0* 6.0*  MG  --   --  1.4* 2.3 2.1  PHOS  --   --  9.1* 5.4* 6.0*    Liver Function Tests: Recent Labs  Lab 07/02/20 0346 07/02/20 0753 07/03/20 0405  AST 554* 849* 1,152*  ALT 524* 777* 581*  ALKPHOS 42 46 66  BILITOT 0.8 0.6 0.6  PROT 4.0* 3.9* 3.8*  ALBUMIN 2.6* 2.4* 2.2*   No results for input(s): LIPASE, AMYLASE in the last 168 hours. No results for input(s):  AMMONIA in the last 168 hours.  CBC: Recent Labs  Lab 06/30/2020 0741 07/09/2020 1507 06/21/2020 1807 07/11/2020 2112 07/02/20 0145 07/02/20 0753 07/03/20 0405 07/03/20 0902 07/03/20 1511 07/03/20 2208 07/04/20 0419  WBC 13.3* 21.9* 24.6*  --  27.3*  --  20.9*  --   --   --  25.2*  NEUTROABS 9.5*  --   --   --   --   --   --   --   --   --   --   HGB 13.7 8.3* 6.6*   < > 11.9*   < > 10.0* 9.9* 9.4* 8.9* 8.6*  HCT 41.1 24.7* 20.5*   < > 37.1*   < > 29.2* 29.8* 27.2* 27.6* 26.6*  MCV 85.6 87.6 91.5  --  82.6  --  79.3*  --   --   --  82.9  PLT 290 216 246  --  158  --  141*  --   --   --  120*   < > = values in this interval not displayed.    Cardiac Enzymes: Recent Labs  Lab 07/02/20 0346 07/02/20 0753  CKTOTAL 114 178    BNP: Invalid input(s): POCBNP  CBG: Recent Labs  Lab 07/03/20 1551 07/03/20 1925 07/03/20 2313 07/04/20 0341 07/04/20 0735  GLUCAP 146* 135* 123* 128* 99    Microbiology: Results  for orders placed or performed during the hospital encounter of 06/27/2020  MRSA PCR Screening     Status: None   Collection Time: 06/17/2020  4:32 PM   Specimen: Nasopharyngeal  Result Value Ref Range Status   MRSA by PCR NEGATIVE NEGATIVE Final    Comment:        The GeneXpert MRSA Assay (FDA approved for NASAL specimens only), is one component of a comprehensive MRSA colonization surveillance program. It is not intended to diagnose MRSA infection nor to guide or monitor treatment for MRSA infections. Performed at [facility], [address] [place], [place] [address]   CULTURE, BLOOD (ROUTINE X 2) w Reflex to ID Panel     Status: None (Preliminary result)   Collection Time: 07/11/2020  6:07 PM   Specimen: BLOOD  Result Value Ref Range Status   Specimen Description BLOOD BLOOD RIGHT HAND  Final   Special Requests   Final    BOTTLES DRAWN AEROBIC AND ANAEROBIC Blood Culture results may not be optimal due to an inadequate volume of blood received in  culture bottles   Culture   Final    NO GROWTH 3 DAYS Performed at [facility], [address] [place], [place] [address]    Report Status PENDING  Incomplete  CULTURE, BLOOD (ROUTINE X 2) w Reflex to ID Panel     Status: None (Preliminary result)   Collection Time: 06/20/2020  6:07 PM   Specimen: BLOOD  Result Value Ref Range Status   Specimen Description BLOOD LEFT THUMB  Final   Special Requests   Final    BOTTLES DRAWN AEROBIC AND ANAEROBIC Blood Culture adequate volume   Culture   Final    NO GROWTH 3 DAYS Performed at [facility], [address] [place], [place] [address]    Report Status PENDING  Incomplete  Culture, Respiratory w Gram Stain     Status: None (Preliminary result)   Collection Time: 07/03/20  2:17 PM   Specimen: Tracheal Aspirate; Respiratory  Result Value Ref Range Status   Specimen Description   Final    TRACHEAL ASPIRATE Performed at [facility], [address] [place], [place] [address]    Special Requests   Final    NONE Performed at [facility], [address] [place], [place] [address]    Gram Stain   Final    FEW WBC PRESENT, PREDOMINANTLY PMN ABUNDANT GRAM NEGATIVE RODS Performed at [facility], [address] [place], [place] [address]    Culture PENDING  Incomplete   Report Status PENDING  Incomplete    Coagulation Studies: No results for input(s): LABPROT, INR in the last 72 hours.  Urinalysis: Recent Labs    07/02/20 0301  COLORURINE YELLOW*  LABSPEC 1.013  PHURINE 5.0  GLUCOSEU >=500*  HGBUR LARGE*  BILIRUBINUR NEGATIVE  KETONESUR 20*  PROTEINUR 30*  NITRITE NEGATIVE  LEUKOCYTESUR NEGATIVE      Imaging: DG Abd 1 View  Result Date: 07/03/2020 CLINICAL DATA:  Vomiting EXAM: ABDOMEN - 1 VIEW COMPARISON:  01/04/2006 FINDINGS: NG tube coils in the stomach. Gas within mildly prominent large and small bowel loops. No visible free air or organomegaly. IMPRESSION: Gas  throughout mildly prominent large and small bowel loops may reflect mild ileus. NG tube in the stomach. Electronically Signed   By: Rolm Baptise M.D.   On: 07/03/2020 07:32   US RENAL  Result Date: 07/02/2020 CLINICAL DATA:  Acute renal failure,  history diabetes mellitus, coronary artery disease post MI, peripheral vascular disease, hypertension, hyperlipidemia CT, former smoker EXAM: RENAL / URINARY TRACT ULTRASOUND COMPLETE COMPARISON:  CT angio abdomen and pelvis 06/25/2020 FINDINGS: Right Kidney: Renal measurements: 12.6 x 6.1 x 6.2 cm = volume: 251 mL. Normal cortical thickness. Borderline increased cortical echogenicity. No mass or hydronephrosis. No shadowing calcification. Minimal perinephric fluid. Left Kidney: Renal measurements: 13.3 x 7.1 x 6.2 cm = volume: 307 mL. Normal cortical thickness. Borderline increased cortical echogenicity. No mass, hydronephrosis, or shadowing calcification. Bladder: Decompressed by Foley catheter, unable to evaluate. Other: Minimal ascites.  Question fatty infiltration of liver. IMPRESSION: Question medical renal disease changes of both kidneys. No evidence of renal mass or hydronephrosis. Minimal ascites. Electronically Signed   By: Lavonia Dana M.D.   On: 07/02/2020 10:30   DG Chest Port 1 View  Result Date: 07/04/2020 CLINICAL DATA:  Acute respiratory failure with hypoxia EXAM: PORTABLE CHEST 1 VIEW COMPARISON:  07/02/2020 FINDINGS: *Endotracheal tube tip terminates in the mid to lower trachea, 2.6 cm from the expected location of the carina. *Transesophageal tube coils in the left upper quadrant prior to terminating in the region of the gastric body. *Transesophageal temperature probe terminates in the cervical esophagus *Left IJ approach central venous catheter tip terminates in the lower SVC. *Right IJ approach venous catheter tip terminates at the superior cavoatrial junction. *Telemetry leads and external support devices overlie the chest. Streaky opacities in  the lung bases, possibly atelectatic though airspace disease or edema could have a similar appearance particularly in the left lung base. Partial obscuration of the left hemidiaphragm may reflect some layering effusion or basilar consolidation. No right effusion. No pneumothorax. Stable cardiomediastinal contours. No acute osseous or soft tissue abnormality. Degenerative changes are present in the imaged spine and shoulders. IMPRESSION: 1. Lines and tubes as above. 2. Increasing streaky opacities in the lung bases, possibly atelectatic though airspace disease or edema could have a similar appearance particularly given some more hazy opacity in the left lung base partially obscuring the left hemidiaphragm. Electronically Signed   By: Lovena Le M.D.   On: 07/04/2020 02:12   ECHOCARDIOGRAM COMPLETE  Result Date: 07/04/2020    ECHOCARDIOGRAM REPORT   Patient Name:   Ivan Larsen [facility] Date of Exam: 07/03/2020 Medical Rec #:  [id number]         Height:       65.0 in Accession #:    [id number]        Weight:       145.9 lb Date of Birth:  1955/08/18         BSA:          1.730 m Patient Age:    58 years          BP:           94/62 mmHg Patient Gender: M                 HR:           63 bpm. Exam Location:  [facility] Procedure: 2D Echo, Color Doppler, Cardiac Doppler and Intracardiac            Opacification Agent Indications:     Shock  History:         Patient has no prior history of Echocardiogram examinations.                  CAD, PAD; Risk Factors:Dyslipidemia and Diabetes.  Sonographer:  Charmayne Sheer RDCS (AE) Referring Phys:  [id number] Bradly Bienenstock Diagnosing Phys: Serafina Royals MD  Sonographer Comments: Echo performed with patient supine and on artificial respirator, no parasternal window, suboptimal apical window and no subcostal window. Image acquisition challenging due to respiratory motion. IMPRESSIONS  1. Left ventricular ejection fraction, by estimation, is 50 to 55%. The left ventricle has low normal  function. The left ventricle has no regional wall motion abnormalities. Left ventricular diastolic function could not be evaluated.  2. Right ventricular systolic function is normal. The right ventricular size is normal.  3. The mitral valve is normal in structure. Mild mitral valve regurgitation.  4. The aortic valve is normal in structure. Aortic valve regurgitation is not visualized. FINDINGS  Left Ventricle: Left ventricular ejection fraction, by estimation, is 50 to 55%. The left ventricle has low normal function. The left ventricle has no regional wall motion abnormalities. The left ventricular internal cavity size was normal in size. There is no left ventricular hypertrophy. Left ventricular diastolic function could not be evaluated. Right Ventricle: The right ventricular size is normal. No increase in right ventricular wall thickness. Right ventricular systolic function is normal. Left Atrium: Left atrial size was normal in size. Right Atrium: Right atrial size was normal in size. Pericardium: There is no evidence of pericardial effusion. Mitral Valve: The mitral valve is normal in structure. Mild mitral valve regurgitation. MV peak gradient, 4.6 mmHg. The mean mitral valve gradient is 2.0 mmHg. Tricuspid Valve: The tricuspid valve is normal in structure. Tricuspid valve regurgitation is trivial. Aortic Valve: The aortic valve is normal in structure. Aortic valve regurgitation is not visualized. Aortic valve mean gradient measures 2.0 mmHg. Aortic valve peak gradient measures 3.9 mmHg. Pulmonic Valve: The pulmonic valve was normal in structure. Pulmonic valve regurgitation is not visualized. Aorta: The aortic root and ascending aorta are structurally normal, with no evidence of dilitation. IAS/Shunts: No atrial level shunt detected by color flow Doppler.   LV Volumes (MOD) LV vol d, MOD A2C: 60.9 ml Diastology LV vol d, MOD A4C: 78.5 ml LV e' medial:    4.68 cm/s LV vol s, MOD A2C: 38.8 ml LV E/e' medial:   14.1 LV vol s, MOD A4C: 48.1 ml LV e' lateral:   13.60 cm/s LV SV MOD A2C:     22.1 ml LV E/e' lateral: 4.9 LV SV MOD A4C:     78.5 ml LV SV MOD BP:      27.9 ml RIGHT VENTRICLE RV Basal diam:  3.00 cm LEFT ATRIUM             Index       RIGHT ATRIUM          Index LA Vol (A2C):   18.8 ml 10.87 ml/m RA Area:     8.18 cm LA Vol (A4C):   24.2 ml 13.99 ml/m RA Volume:   13.60 ml 7.86 ml/m LA Biplane Vol: 22.6 ml 13.06 ml/m  AORTIC VALVE AV Vmax:           98.60 cm/s AV Vmean:          65.400 cm/s AV VTI:            0.154 m AV Peak Grad:      3.9 mmHg AV Mean Grad:      2.0 mmHg LVOT Vmax:         89.40 cm/s LVOT Vmean:        59.300 cm/s LVOT VTI:  0.133 m LVOT/AV VTI ratio: 0.86 MITRAL VALVE MV Area (PHT): 3.31 cm    SHUNTS MV Peak grad:  4.6 mmHg    Systemic VTI: 0.13 m MV Mean grad:  2.0 mmHg MV Vmax:       1.07 m/s MV Vmean:      60.9 cm/s MV Decel Time: 229 msec MV E velocity: 66.00 cm/s MV A velocity: 93.00 cm/s MV E/A ratio:  0.71 Serafina Royals MD Electronically signed by Serafina Royals MD Signature Date/Time: 07/04/2020/7:52:02 AM    Final      Medications:   . sodium chloride 100 mL/hr at 07/04/20 0511  . sodium chloride 10 mL/hr at 06/27/2020 0809  . sodium chloride    . ampicillin-sulbactam (UNASYN) IV Stopped (07/03/20 2328)  . anticoagulant sodium citrate    . dexmedetomidine (PRECEDEX) IV infusion 0.8 mcg/kg/hr (07/04/20 3154)  . esmolol    . famotidine (PEPCID) IV Stopped (07/03/20 2237)  . fentaNYL infusion INTRAVENOUS Stopped (07/03/20 1400)  . magnesium sulfate bolus IVPB    . nitroGLYCERIN    . norepinephrine (LEVOPHED) Adult infusion 7 mcg/min (07/04/20 0832)  . vasopressin 0.03 Units/min (07/04/20 0504)   . sodium chloride   Intravenous Once  . aspirin EC  81 mg Oral Q0600  . atorvastatin  40 mg Oral Daily  . calcium carbonate (dosed in mg elemental calcium)  500 mg of elemental calcium Per Tube BID  . chlorhexidine gluconate (MEDLINE KIT)  15 mL Mouth Rinse BID   . Chlorhexidine Gluconate Cloth  6 each Topical Once  . collagenase  1 application Topical Daily  . docusate  100 mg Per Tube Daily  . hydrocortisone sod succinate (SOLU-CORTEF) inj  50 mg Intravenous Q6H  . insulin aspart  0-20 Units Subcutaneous Q4H  . insulin detemir  6 Units Subcutaneous BID  . mouth rinse  15 mL Mouth Rinse 10 times per day  . midodrine  5 mg Per Tube TID WC   sodium chloride, acetaminophen **OR** acetaminophen, alum & mag hydroxide-simeth, anticoagulant sodium citrate, guaiFENesin-dextromethorphan, hydrALAZINE, HYDROmorphone (DILAUDID) injection, labetalol, magnesium sulfate bolus IVPB, metoprolol tartrate, midazolam, morphine injection, ondansetron (ZOFRAN) IV, ondansetron, oxyCODONE-acetaminophen, phenol, potassium chloride, senna-docusate, sorbitol  Assessment/ Plan:  Ivan Larsen is a 65 y.o. white male with peripheral vascular disease, coronary artery disease, nephrolithiasis, hyperlipidemia, diabetes mellitus type II, who was admitted to [facility] on 06/29/2020 for Atherosclerosis of artery of extremity with ulceration ([place]) [I70.299, M08.676] S/P aorto-bifemoral bypass surgery [P95.093] Underwent bilateral femoral artery bypass graft by Dr. Lucky Cowboy and Dr. Delana Meyer. on 06/17/2020   1. Acute renal failure with hypernatremia and metabolic acidosis: with baseline creatinine of 0.9 with normal GFR on 06/25/20.  Acidosis has reversed with bicarbonate infusion.  History of glycosuria and underlying diabetic kidney disease.  AKI likely ATN due to hypotension  02/18 0701 - 02/19 0700 In: 3298.2 [I.V.:2858.2; NG/GT:90; IV Piggyback:350] Out: 640 [Urine:340; Emesis/NG output:300]  Lab Results  Component Value Date   CREATININE 4.25 (H) 07/04/2020   CREATININE 3.17 (H) 07/03/2020   CREATININE 1.87 (H) 07/02/2020   Decreasing UOP is concerning with increasing Creatinine trends - Avoid hypotension - supportive care - no acute indication for HD at present but may need  HD this admission - discussed with daughter this admission  2. Hypotension: with acute blood loss - circulatory shock. Requiring vasopressors: phenylephrine and vasopressin.  - stress dose steroids  3. Hypernatremia - getting 1/2 NS  \monitor    LOS: 3 Keena Heesch 2/19/20228:38 AM

## 2020-07-05 DIAGNOSIS — J9601 Acute respiratory failure with hypoxia: Secondary | ICD-10-CM | POA: Diagnosis not present

## 2020-07-05 DIAGNOSIS — N170 Acute kidney failure with tubular necrosis: Secondary | ICD-10-CM | POA: Diagnosis not present

## 2020-07-05 LAB — CBC
HCT: 24 % — ABNORMAL LOW (ref 39.0–52.0)
Hemoglobin: 7.9 g/dL — ABNORMAL LOW (ref 13.0–17.0)
MCH: 27.2 pg (ref 26.0–34.0)
MCHC: 32.9 g/dL (ref 30.0–36.0)
MCV: 82.8 fL (ref 80.0–100.0)
Platelets: 112 10*3/uL — ABNORMAL LOW (ref 150–400)
RBC: 2.9 MIL/uL — ABNORMAL LOW (ref 4.22–5.81)
RDW: 25.3 % — ABNORMAL HIGH (ref 11.5–15.5)
WBC: 11.2 10*3/uL — ABNORMAL HIGH (ref 4.0–10.5)
nRBC: 5.5 % — ABNORMAL HIGH (ref 0.0–0.2)

## 2020-07-05 LAB — COMPREHENSIVE METABOLIC PANEL
ALT: 125 U/L — ABNORMAL HIGH (ref 0–44)
AST: 386 U/L — ABNORMAL HIGH (ref 15–41)
Albumin: 2 g/dL — ABNORMAL LOW (ref 3.5–5.0)
Alkaline Phosphatase: 122 U/L (ref 38–126)
Anion gap: 18 — ABNORMAL HIGH (ref 5–15)
BUN: 79 mg/dL — ABNORMAL HIGH (ref 8–23)
CO2: 27 mmol/L (ref 22–32)
Calcium: 6.2 mg/dL — CL (ref 8.9–10.3)
Chloride: 99 mmol/L (ref 98–111)
Creatinine, Ser: 5.2 mg/dL — ABNORMAL HIGH (ref 0.61–1.24)
GFR, Estimated: 12 mL/min — ABNORMAL LOW (ref >60)
Glucose, Bld: 115 mg/dL — ABNORMAL HIGH (ref 70–99)
Potassium: 4.2 mmol/L (ref 3.5–5.1)
Sodium: 144 mmol/L (ref 135–145)
Total Bilirubin: 1.4 mg/dL — ABNORMAL HIGH (ref 0.3–1.2)
Total Protein: 4.5 g/dL — ABNORMAL LOW (ref 6.5–8.1)

## 2020-07-05 LAB — GLUCOSE, CAPILLARY
Glucose-Capillary: 100 mg/dL — ABNORMAL HIGH (ref 70–99)
Glucose-Capillary: 100 mg/dL — ABNORMAL HIGH (ref 70–99)
Glucose-Capillary: 110 mg/dL — ABNORMAL HIGH (ref 70–99)
Glucose-Capillary: 78 mg/dL (ref 70–99)
Glucose-Capillary: 87 mg/dL (ref 70–99)
Glucose-Capillary: 92 mg/dL (ref 70–99)

## 2020-07-05 LAB — HEPATITIS B SURFACE ANTIBODY,QUALITATIVE: Hep B S Ab: NONREACTIVE

## 2020-07-05 LAB — HEPATITIS B SURFACE ANTIGEN: Hepatitis B Surface Ag: NONREACTIVE

## 2020-07-05 LAB — MAGNESIUM: Magnesium: 2.3 mg/dL (ref 1.7–2.4)

## 2020-07-05 LAB — PROCALCITONIN: Procalcitonin: 16.33 ng/mL

## 2020-07-05 LAB — HEPATITIS C ANTIBODY: HCV Ab: NONREACTIVE

## 2020-07-05 LAB — PHOSPHORUS: Phosphorus: 7.8 mg/dL — ABNORMAL HIGH (ref 2.5–4.6)

## 2020-07-05 MED ORDER — ORAL CARE MOUTH RINSE
15.0000 mL | OROMUCOSAL | Status: DC
  Administered 2020-07-05 – 2020-07-08 (×16): 15 mL via OROMUCOSAL

## 2020-07-05 MED ORDER — ORAL CARE MOUTH RINSE
15.0000 mL | Freq: Two times a day (BID) | OROMUCOSAL | Status: DC
  Administered 2020-07-05 (×2): 15 mL via OROMUCOSAL

## 2020-07-05 MED ORDER — ALBUMIN HUMAN 25 % IV SOLN
25.0000 g | Freq: Once | INTRAVENOUS | Status: AC
  Administered 2020-07-05: 25 g via INTRAVENOUS
  Filled 2020-07-05: qty 100

## 2020-07-05 MED ORDER — WHITE PETROLATUM EX OINT
TOPICAL_OINTMENT | CUTANEOUS | Status: DC | PRN
  Administered 2020-07-05 (×2): 1 via TOPICAL
  Filled 2020-07-05 (×3): qty 5

## 2020-07-05 NOTE — Progress Notes (Signed)
Cheryll Cockayne NP aware of critical Calcium of 6.2.

## 2020-07-05 NOTE — Progress Notes (Signed)
PT Cancellation Note  Patient Details Name: Ivan Larsen MRN: [id number] DOB: 03/11/1956   Cancelled Treatment:    Reason Eval/Treat Not Completed: Medical issues which prohibited therapy PT orders received, chart reviewed. Pt noted to have critically low Calcium levels (6.2). Exertional activity contraindicated at this time. Will f/u as pt is medically ready.  Aleda Grana, PT, DPT 07/05/20, 9:05 AM    Sandi Mariscal 07/05/2020, 9:04 AM

## 2020-07-05 NOTE — Progress Notes (Signed)
Patients O2 sats 88-92%, weak cough, very poor effort on IS and flutter.Upated Cheryll Cockayne NP, new orders received and RT notified.

## 2020-07-05 NOTE — Progress Notes (Signed)
Patient PO2 saturations up to 96-97% on BIPAP. Patient daughter updated on patient condtion.

## 2020-07-05 NOTE — Progress Notes (Signed)
NAME:  Ivan Larsen, MRN:  [id number], DOB:  06-06-1955, LOS: 4 ADMISSION DATE:  July 03, 2020, INITIAL CONSULTATION DATE:  2020/07/03 REFERRING MD:  Dr. Wyn Quaker, CHIEF COMPLAINT:  Hemorrhagic shock   Brief History:  65 yo male with hx of severe peripheral vascular disease, dm, dyslipidemia who underwent elective Aortobifemoral bypass on 03-Jul-2020. Intraop he lost apx 2L blood with 1/2 returned via blood saver, received IVF fluids.  After surgery patient was in circulatory shock and was emergently brought to MICU due to medical instability. He required vasopressor support perioperatively.        Past Medical History:  Peripheral artery disease Myocardial infarction Hyperlipidemia Kidney stones Elevated liver enzyme Diabetes mellitus Coronary artery disease  Significant Hospital Events:  2/16: Underwent elective aortobifemoral bypass; intraoperative blood loss with hemorrhagic shock: Transferred to ICU post procedure remained intubated 2/17: AKI, nephrology consulted, temporary dialysis catheter placed  Consults:  Vascular surgery Nephrology PCCM   Procedures:  2/16: Aortofemoral bypass 2/16: Right IJ CVC placed 2/17: Left IJ temporary dialysis catheter placed  Significant Diagnostic Tests:  2/17: Renal ultrasound>Question medical renal disease changes of both kidneys. No evidence of renal mass or hydronephrosis.Minimal ascites. 2/18: KUB>>Gas throughout mildly prominent large and small bowel loops may reflect mild ileus. NG tube in the stomach. 2/18: Echocardiogram>>LVEF 50-55% no wall motion abnl.  Micro Data:  2/14: SARS-CoV-2 PCR>> negative 2/16: MRSA PCR>> negative 2/16: Blood culture x2>> 2/18: Tracheal aspirate>>  Antimicrobials:  Cefazolin 2/16>> 2/17 (surgical prophylaxis). Unasyn 2/18>>  Interim History / Subjective:  Tolerating SBT, follows commands   Objective   Blood pressure (!) 146/55, pulse 91, temperature 99.2 F (37.3 C), temperature source Oral,  resp. rate (!) 24, height  (1.651 m), weight 80.4 kg, SpO2 96 %.        Intake/Output Summary (Last 24 hours) at 07/05/2020 2246 Last data filed at 07/05/2020 2200 Gross per 24 hour  Intake 2499.02 ml  Output 770 ml  Net 1729.02 ml   Filed Weights   07-03-2020 0801 07/03/20 1630 07/05/20 0400  Weight: 63.5 kg 66.2 kg 80.4 kg    Examination: General: Critically ill-appearing male, laying in bed, intubated,SAT: follows commands, no acute distress HENT: Atraumatic, normocephalic, neck supple, no JVD, ET tube in place Lungs: Clear to auscultation bilaterally, synchronous with the ventilator, even Cardiovascular: Regular rate and rhythm, S1-S2, no murmurs, rubs, gallops Abdomen: Distended, tender, bowel sounds positive x4, midline abdominal honeycomb dressing clean dry and intact Extremities: No deformities, 3+ edema bilateral lower extremities GU: scrotal edema, ecchymotic,foley in place Neuro: Sedated, withdraws from pain, follows commands off sedation, pupils PERRLA     Resolved Hospital Problem list   N/A  Assessment & Plan:   Post-op Respiratory in the setting of Hemorrhagic shock and Metabolic Derangements -Extubated yesterday -Very weak cough but maintaining airway well -Follow intermittent chest x-ray and ABG as needed -Continue pulmonary toilet -Main limitation is malnutrition and deconditioning  Hemorrhagic shock +/-septic shock -  shock physiology resolved -Continuous cardiac monitoring -Maintain MAP greater than 65 -IV fluids, decreased today -Blood transfusions as indicated -Supplemental albumin to help with oncotic pressure -Vasopressors weaned off -Stress dose steroids, wean -Continue Midodrine -2D echocardiogram 50-55% LVEF   Questionable pneumonia -Monitor fever -Curve trend WBCs and procalcitonin -Follow cultures as above ~obtain tracheal aspirate 2/18, results pending -Continue empiric Unasyn for now   Acute Blood Loss Anemia -Monitor for  S/Sx of bleeding -Trend CBC (H&H q6h) -SCD's for VTE Prophylaxis (can start chemical prophylaxis when cleared by  surgery) -Transfuse for Hgb <8   AKI Metabolic alkalosis Hypernatremia -Monitor I&O's / urinary output -Follow BMP -Ensure adequate renal perfusion -Avoid nephrotoxic agents as able -Replace electrolytes as indicated -Nephrology following, appreciate input -Withholding fluids due to volume overload -will likely need dialysis/CRRT -Discussed with Dr. Thedore MinsSingh   Severe Peripheral Vascular Disease s/p Aortobifemoral bypass -Vascular Surgery following, will follow recommendations -Discussed with Dr. Evie LacksEsco -Pain control   Hyperglycemia CBG's SSI Follow ICU Hypo/Hyperglycemia protocol   Best practice (evaluated daily)  Diet: NPO, swallow eval as able Pain/Anxiety/Delirium protocol (if indicated): Fentanyl/Precedex VAP protocol (if indicated): yes, implemented DVT prophylaxis: SCD's GI prophylaxis: Pepcid Glucose control: SSI, Levemir Mobility: Bedrest Disposition:ICU  Goals of Care:  Last date of multidisciplinary goals of care discussion:07/03/2020 Family and staff present: Bedside RN and pts daughter at bedside Summary of discussion: Extubated 2/19, wean vasopressors as able, continue ABX for potential PNA Follow up goals of care discussion due: 07/06/2020 Code Status: FULL CODE  Labs   CBC: Recent Labs  Lab 07/12/2020 0741 07/13/2020 1507 06/28/2020 1807 06/24/2020 2112 07/02/20 0145 07/02/20 0753 07/03/20 0405 07/03/20 0902 07/03/20 1511 07/03/20 2208 07/04/20 0419 07/04/20 1053 07/05/20 0443  WBC 13.3*   < > 24.6*  --  27.3*  --  20.9*  --   --   --  25.2*  --  11.2*  NEUTROABS 9.5*  --   --   --   --   --   --   --   --   --   --   --   --   HGB 13.7   < > 6.6*   < > 11.9*   < > 10.0*   < > 9.4* 8.9* 8.6* 8.3* 7.9*  HCT 41.1   < > 20.5*   < > 37.1*   < > 29.2*   < > 27.2* 27.6* 26.6* 25.0* 24.0*  MCV 85.6   < > 91.5  --  82.6  --  79.3*  --   --   --   82.9  --  82.8  PLT 290   < > 246  --  158  --  141*  --   --   --  120*  --  112*   < > = values in this interval not displayed.    Basic Metabolic Panel: Recent Labs  Lab 07/09/2020 2020 07/02/20 0346 07/03/20 0405 07/04/20 0419 07/05/20 0443  NA 146* 150* 149* 147* 144  K 3.4* 3.4* 3.9 4.4 4.2  CL 108 105 100 101 99  CO2 14* 12* 35* 32 27  GLUCOSE 219* 234* 294* 112* 115*  BUN 18 22 47* 63* 79*  CREATININE 1.45* 1.87* 3.17* 4.25* 5.20*  CALCIUM 6.4* 6.6* 6.0* 6.0* 6.2*  MG  --  1.4* 2.3 2.1 2.3  PHOS  --  9.1* 5.4* 6.0* 7.8*   GFR: Estimated Creatinine Clearance: 13.8 mL/min (A) (by C-G formula based on SCr of 5.2 mg/dL (H)). Recent Labs  Lab 06/27/2020 2020 07/08/2020 2301 07/02/20 0145 07/02/20 0346 07/02/20 0502 07/03/20 0405 07/04/20 0419 07/05/20 0443  PROCALCITON 0.12  --   --  1.30  --  3.68 8.85 16.33  WBC  --   --  27.3*  --   --  20.9* 25.2* 11.2*  LATICACIDVEN >11.0* >11.0*  --   --  >11.0*  --   --   --     Liver Function Tests: Recent Labs  Lab 07/02/20 0346 07/02/20 0753 07/03/20 0405 07/04/20  9381 07/05/20 0443  AST 554* 849* 1,152* 491* 386*  ALT 524* 777* 581* 222* 125*  ALKPHOS 42 46 66 87 122  BILITOT 0.8 0.6 0.6 0.6 1.4*  PROT 4.0* 3.9* 3.8* 3.9* 4.5*  ALBUMIN 2.6* 2.4* 2.2* 1.9* 2.0*   No results for input(s): LIPASE, AMYLASE in the last 168 hours. No results for input(s): AMMONIA in the last 168 hours.  ABG    Component Value Date/Time   PHART 7.52 (H) 07/04/2020 0344   PCO2ART 39 07/04/2020 0344   PO2ART 58 (L) 07/04/2020 0344   HCO3 31.8 (H) 07/04/2020 0344   ACIDBASEDEF 14.7 (H) 07/02/2020 0555   O2SAT 92.6 07/04/2020 0344     Coagulation Profile: Recent Labs  Lab Jul 12, 2020 0741  INR 1.0    Cardiac Enzymes: Recent Labs  Lab 07/02/20 0346 07/02/20 0753  CKTOTAL 114 178    HbA1C: Hemoglobin A1C  Date/Time Value Ref Range Status  12/22/2015 12:00 AM 7.3  Final   HB A1C (BAYER DCA - WAIVED)  Date/Time Value  Ref Range Status  06/02/2020 01:44 PM 6.3 <7.0 % Final    Comment:                                          Diabetic Adult            <7.0                                       Healthy Adult        4.3 - 5.7                                                           (DCCT/NGSP) American Diabetes Association's Summary of Glycemic Recommendations for Adults with Diabetes: Hemoglobin A1c <7.0%. More stringent glycemic goals (A1c <6.0%) may further reduce complications at the cost of increased risk of hypoglycemia.   02/07/2020 08:07 AM 6.4 <7.0 % Final    Comment:                                          Diabetic Adult            <7.0                                       Healthy Adult        4.3 - 5.7                                                           (DCCT/NGSP) American Diabetes Association's Summary of Glycemic Recommendations for Adults with Diabetes: Hemoglobin A1c <7.0%. More stringent glycemic goals (A1c <6.0%) may further reduce complications at the cost of increased risk of hypoglycemia.  Hgb A1c MFr Bld  Date/Time Value Ref Range Status  07/03/2020 04:05 AM 5.7 (H) 4.8 - 5.6 % Final    Comment:    (NOTE)         Prediabetes: 5.7 - 6.4         Diabetes: >6.4         Glycemic control for adults with diabetes: <7.0   07/02/2020 03:46 AM 5.7 (H) 4.8 - 5.6 % Final    Comment:    (NOTE)         Prediabetes: 5.7 - 6.4         Diabetes: >6.4         Glycemic control for adults with diabetes: <7.0     CBG: Recent Labs  Lab 07/05/20 0312 07/05/20 0722 07/05/20 1111 07/05/20 1530 07/05/20 1939  GLUCAP 110* 100* 100* 92 87    Review of Systems:   Unable to assess due to intubation and sedation  Past Medical History:  He,  has a past medical history of CAD (coronary artery disease), Diabetes mellitus without complication (HCC), Elevated liver enzymes, History of kidney stones, Hyperlipidemia, MI (myocardial infarction) (HCC) (07/01/12), and PAD (peripheral  artery disease) (HCC).   Surgical History:   Past Surgical History:  Procedure Laterality Date  . AORTA - BILATERAL FEMORAL ARTERY BYPASS GRAFT N/A 06/23/2020   Procedure: AORTA BIFEMORAL BYPASS GRAFT (VERSUS BILATERAL FEMORAL ENDARTERECTOMIES);  Surgeon: Annice Needy, MD;  Location: [facility] ORS;  Service: Vascular;  Laterality: N/A;  . CORONARY STENT PLACEMENT    . EYE SURGERY    . LOWER EXTREMITY ANGIOGRAPHY Left 06/25/2020   Procedure: LOWER EXTREMITY ANGIOGRAPHY;  Surgeon: Annice Needy, MD;  Location: [facility] INVASIVE CV LAB;  Service: Cardiovascular;  Laterality: Left;     Social History:   reports that he quit smoking about 16 years ago. His smoking use included cigarettes. He has never used smokeless tobacco. He reports current alcohol use. He reports that he does not use drugs.   Family History:  His family history includes Diabetes in his mother; Heart attack in his father.   Allergies Allergies  Allergen Reactions  . Iodinated Diagnostic Agents Nausea Only    Other reaction(s): Vomiting  . Iodine   . Shellfish Allergy Nausea And Vomiting and Other (See Comments)    Sweating  Sweating    . Shellfish-Derived Products Nausea And Vomiting and Other (See Comments)    Other reaction(s): Nausea And Vomiting, Other (See Comments) Sweating  Sweating      Home Medications  Prior to Admission medications   Medication Sig Start Date End Date Taking? Authorizing Provider  amLODipine (NORVASC) 10 MG tablet Take 1 tablet (10 mg total) by mouth daily. 02/17/20  Yes Bacigalupo, Marzella Schlein, MD  atorvastatin (LIPITOR) 40 MG tablet Take 1 tablet (40 mg total) by mouth daily. 02/17/20  Yes Bacigalupo, Marzella Schlein, MD  benazepril (LOTENSIN) 40 MG tablet Take 1 tablet (40 mg total) by mouth daily. 02/17/20  Yes Bacigalupo, Marzella Schlein, MD  collagenase (SANTYL) ointment Apply 1 application topically daily. 05/05/20  Yes Cannady, Corrie Dandy T, NP  empagliflozin (JARDIANCE) 25 MG TABS tablet Take 1 tablet (25 mg  total) by mouth daily before breakfast. 03/20/20  Yes Cathlean Marseilles A, NP  gabapentin (NEURONTIN) 300 MG capsule Take 1 capsule (300 mg total) by mouth 3 (three) times daily. 05/05/20  Yes Cannady, Jolene T, NP  hydrochlorothiazide (HYDRODIURIL) 25 MG tablet Take 1 tablet (25 mg total) by mouth daily.  03/20/20  Yes Valentino Nose, NP  HYDROcodone-acetaminophen (NORCO) 5-325 MG tablet Take 1 tablet by mouth every 6 (six) hours as needed for moderate pain. 06/25/20  Yes Dew, Marlow Baars, MD  meclizine (ANTIVERT) 25 MG tablet Take 1 tablet (25 mg total) by mouth 3 (three) times daily as needed for dizziness or nausea. 08/07/19  Yes Particia Nearing, PA-C  metoprolol tartrate (LOPRESSOR) 50 MG tablet Take 1 tablet (50 mg total) by mouth 2 (two) times daily. 02/13/20  Yes Valentino Nose, NP  mupirocin ointment (BACTROBAN) 2 % Apply 1 application topically 2 (two) times daily. 06/02/20  Yes Cannady, Jolene T, NP  Omega-3 Fatty Acids (FISH OIL OMEGA-3) 1000 MG CAPS Take by mouth 2 (two) times daily.   Yes [provider]  ondansetron (ZOFRAN-ODT) 4 MG disintegrating tablet 1 TABLET ORALLY DISSOLVED EVERY 8 HOURS IF NEEDED FOR NAUSEA AND VOMITING Patient taking differently: Take 4 mg by mouth every 8 (eight) hours as needed for nausea or vomiting. 12/27/19  Yes Cathlean Marseilles A, NP  sitaGLIPtin (JANUVIA) 25 MG tablet Take 1 tablet (25 mg total) by mouth daily. 03/20/20  Yes Valentino Nose, NP  tamsulosin (FLOMAX) 0.4 MG CAPS capsule Take 1 capsule (0.4 mg total) by mouth at bedtime. Take at nighttime to minimize dizziness/hypotension 03/20/20  Yes Cathlean Marseilles A, NP  clopidogrel (PLAVIX) 75 MG tablet Take 1 tablet (75 mg total) by mouth daily. Patient not taking: No sig reported 02/13/20   Valentino Nose, NP  doxycycline (VIBRA-TABS) 100 MG tablet Take 1 tablet (100 mg total) by mouth 2 (two) times daily. Patient not taking: No sig reported 06/02/20   Aura Dials T, NP   metFORMIN (GLUCOPHAGE) 500 MG tablet Take 2 tablets (1,000 mg total) by mouth 2 (two) times daily with a meal. 03/20/20   Valentino Nose, NP     Critical care time: 40 minutes    The patient is critically ill with multiple organ systems failure and requires high complexity decision making for assessment and support, frequent evaluation and titration of therapies, application of advanced monitoring technologies and extensive interpretation of multiple databases. Critical Care Time devoted to patient care services described in this note is 35 minutes.   Gailen Shelter, MD [place]  [facility]  *This note was dictated using voice recognition software/Dragon.  Despite best efforts to proofread, errors can occur which can change the meaning.  Any change was purely unintentional.

## 2020-07-05 NOTE — Progress Notes (Signed)
[facility]  ROUNDING NOTE   Subjective:   Gen- NAD Neuro-alert and interactive Pulm-now extubated, Home O2 cv- off pressors Gi-NG tube in place Renal-UOP is low   Objective:  Vital signs in last 24 hours:  Temp:  [97.5 F (36.4 C)-98.42 F (36.9 C)] 97.8 F (36.6 C) (02/20 0000) Pulse Rate:  [73-103] 103 (02/20 0600) Resp:  [11-27] 20 (02/20 0600) BP: (75-135)/(32-85) 135/52 (02/20 0600) SpO2:  [92 %-99 %] 98 % (02/20 0600) Weight:  [80.4 kg] 80.4 kg (02/20 0400)  Weight change:  Filed Weights   07/25/2020 0801 July 25, 2020 1630 07/05/20 0400  Weight: 63.5 kg 66.2 kg 80.4 kg    Intake/Output: I/O last 3 completed shifts: In: 4487 [I.V.:3897.1; NG/GT:90; IV Piggyback:499.9] Out: 940 [Urine:840; Emesis/NG output:100]   Intake/Output this shift:  No intake/output data recorded.  Physical Exam: General: Critically ill  HEENT: NGT in place, mouth is dry  Lungs:  [place] O2, mild coarse breath sounds  Heart: Regular rate and rhythm  Abdomen:  +distended, dressings clean, decreased breath sounds  Extremities: 2+peripheral edema. Scrotal edema  Neurologic: Alert and able to answer Qs  Skin: No lesions  Access: Left IJ temp dialysis cath    Basic Metabolic Panel: Recent Labs  Lab 07/25/20 2020 07/02/20 0346 07/03/20 0405 07/04/20 0419 07/05/20 0443  NA 146* 150* 149* 147* 144  K 3.4* 3.4* 3.9 4.4 4.2  CL 108 105 100 101 99  CO2 14* 12* 35* 32 27  GLUCOSE 219* 234* 294* 112* 115*  BUN 18 22 47* 63* 79*  CREATININE 1.45* 1.87* 3.17* 4.25* 5.20*  CALCIUM 6.4* 6.6* 6.0* 6.0* 6.2*  MG  --  1.4* 2.3 2.1 2.3  PHOS  --  9.1* 5.4* 6.0* 7.8*    Liver Function Tests: Recent Labs  Lab 07/02/20 0346 07/02/20 0753 07/03/20 0405 07/04/20 0419 07/05/20 0443  AST 554* 849* 1,152* 491* 386*  ALT 524* 777* 581* 222* 125*  ALKPHOS 42 46 66 87 122  BILITOT 0.8 0.6 0.6 0.6 1.4*  PROT 4.0* 3.9* 3.8* 3.9* 4.5*  ALBUMIN 2.6* 2.4* 2.2* 1.9* 2.0*   No results for  input(s): LIPASE, AMYLASE in the last 168 hours. No results for input(s): AMMONIA in the last 168 hours.  CBC: Recent Labs  Lab July 25, 2020 0741 07-25-2020 1507 Jul 25, 2020 1807 07/25/20 2112 07/02/20 0145 07/02/20 0753 07/03/20 0405 07/03/20 0902 07/03/20 1511 07/03/20 2208 07/04/20 0419 07/04/20 1053 07/05/20 0443  WBC 13.3*   < > 24.6*  --  27.3*  --  20.9*  --   --   --  25.2*  --  11.2*  NEUTROABS 9.5*  --   --   --   --   --   --   --   --   --   --   --   --   HGB 13.7   < > 6.6*   < > 11.9*   < > 10.0*   < > 9.4* 8.9* 8.6* 8.3* 7.9*  HCT 41.1   < > 20.5*   < > 37.1*   < > 29.2*   < > 27.2* 27.6* 26.6* 25.0* 24.0*  MCV 85.6   < > 91.5  --  82.6  --  79.3*  --   --   --  82.9  --  82.8  PLT 290   < > 246  --  158  --  141*  --   --   --  120*  --  112*   < > = values in this interval not displayed.    Cardiac Enzymes: Recent Labs  Lab 07/02/20 0346 07/02/20 0753  CKTOTAL 114 178    BNP: Invalid input(s): POCBNP  CBG: Recent Labs  Lab 07/04/20 1602 07/04/20 1929 07/04/20 2334 07/05/20 0312 07/05/20 0722  GLUCAP 109* 113* 111* 110* 100*    Microbiology: Results for orders placed or performed during the hospital encounter of 07/09/2020  MRSA PCR Screening     Status: None   Collection Time: 06/30/2020  4:32 PM   Specimen: Nasopharyngeal  Result Value Ref Range Status   MRSA by PCR NEGATIVE NEGATIVE Final    Comment:        The GeneXpert MRSA Assay (FDA approved for NASAL specimens only), is one component of a comprehensive MRSA colonization surveillance program. It is not intended to diagnose MRSA infection nor to guide or monitor treatment for MRSA infections. Performed at [facility], [address] [place], [place] [address]   CULTURE, BLOOD (ROUTINE X 2) w Reflex to ID Panel     Status: None (Preliminary result)   Collection Time: 06/17/2020  6:07 PM   Specimen: BLOOD  Result Value Ref Range Status   Specimen Description BLOOD BLOOD RIGHT  HAND  Final   Special Requests   Final    BOTTLES DRAWN AEROBIC AND ANAEROBIC Blood Culture results may not be optimal due to an inadequate volume of blood received in culture bottles   Culture   Final    NO GROWTH 4 DAYS Performed at [facility], [address] [place], [place] [address]    Report Status PENDING  Incomplete  CULTURE, BLOOD (ROUTINE X 2) w Reflex to ID Panel     Status: None (Preliminary result)   Collection Time: 07/08/2020  6:07 PM   Specimen: BLOOD  Result Value Ref Range Status   Specimen Description BLOOD LEFT THUMB  Final   Special Requests   Final    BOTTLES DRAWN AEROBIC AND ANAEROBIC Blood Culture adequate volume   Culture   Final    NO GROWTH 4 DAYS Performed at [facility], [address] [place], [place] [address]    Report Status PENDING  Incomplete  Culture, Respiratory w Gram Stain     Status: None (Preliminary result)   Collection Time: 07/03/20  2:17 PM   Specimen: Tracheal Aspirate; Respiratory  Result Value Ref Range Status   Specimen Description   Final    TRACHEAL ASPIRATE Performed at [facility], [address] [place], [place] [address]    Special Requests   Final    NONE Performed at [facility], [address] [place], [place] [address]    Gram Stain   Final    FEW WBC PRESENT, PREDOMINANTLY PMN ABUNDANT GRAM NEGATIVE RODS Performed at [facility], [address] [place], [place] [address]    Culture PENDING  Incomplete   Report Status PENDING  Incomplete    Coagulation Studies: No results for input(s): LABPROT, INR in the last 72 hours.  Urinalysis: No results for input(s): COLORURINE, LABSPEC, PHURINE, GLUCOSEU, HGBUR, BILIRUBINUR, KETONESUR, PROTEINUR, UROBILINOGEN, NITRITE, LEUKOCYTESUR in the last 72 hours.  Invalid input(s): APPERANCEUR    Imaging: DG Chest Port 1 View  Result Date: 07/04/2020 CLINICAL DATA:  Acute respiratory failure with hypoxia EXAM: PORTABLE  CHEST 1 VIEW COMPARISON:  07/02/2020 FINDINGS: *Endotracheal tube tip terminates in the mid to lower trachea, 2.6 cm from the  expected location of the carina. *Transesophageal tube coils in the left upper quadrant prior to terminating in the region of the gastric body. *Transesophageal temperature probe terminates in the cervical esophagus *Left IJ approach central venous catheter tip terminates in the lower SVC. *Right IJ approach venous catheter tip terminates at the superior cavoatrial junction. *Telemetry leads and external support devices overlie the chest. Streaky opacities in the lung bases, possibly atelectatic though airspace disease or edema could have a similar appearance particularly in the left lung base. Partial obscuration of the left hemidiaphragm may reflect some layering effusion or basilar consolidation. No right effusion. No pneumothorax. Stable cardiomediastinal contours. No acute osseous or soft tissue abnormality. Degenerative changes are present in the imaged spine and shoulders. IMPRESSION: 1. Lines and tubes as above. 2. Increasing streaky opacities in the lung bases, possibly atelectatic though airspace disease or edema could have a similar appearance particularly given some more hazy opacity in the left lung base partially obscuring the left hemidiaphragm. Electronically Signed   By: Kreg Shropshire M.D.   On: 07/04/2020 02:12   ECHOCARDIOGRAM COMPLETE  Result Date: 07/04/2020    ECHOCARDIOGRAM REPORT   Patient Name:   Ivan Larsen [facility] Date of Exam: 07/03/2020 Medical Rec #:  [id number]         Height:       65.0 in Accession #:    [id number]        Weight:       145.9 lb Date of Birth:  08-22-1955         BSA:          1.730 m Patient Age:    64 years          BP:           94/62 mmHg Patient Gender: M                 HR:           63 bpm. Exam Location:  [facility] Procedure: 2D Echo, Color Doppler, Cardiac Doppler and Intracardiac            Opacification Agent Indications:     Shock   History:         Patient has no prior history of Echocardiogram examinations.                  CAD, PAD; Risk Factors:Dyslipidemia and Diabetes.  Sonographer:     Humphrey Rolls RDCS (AE) Referring Phys:  [id number] Judithe Modest Diagnosing Phys: Arnoldo Hooker MD  Sonographer Comments: Echo performed with patient supine and on artificial respirator, no parasternal window, suboptimal apical window and no subcostal window. Image acquisition challenging due to respiratory motion. IMPRESSIONS  1. Left ventricular ejection fraction, by estimation, is 50 to 55%. The left ventricle has low normal function. The left ventricle has no regional wall motion abnormalities. Left ventricular diastolic function could not be evaluated.  2. Right ventricular systolic function is normal. The right ventricular size is normal.  3. The mitral valve is normal in structure. Mild mitral valve regurgitation.  4. The aortic valve is normal in structure. Aortic valve regurgitation is not visualized. FINDINGS  Left Ventricle: Left ventricular ejection fraction, by estimation, is 50 to 55%. The left ventricle has low normal function. The left ventricle has no regional wall motion abnormalities. The left ventricular internal cavity size was normal in size. There is no left ventricular hypertrophy. Left ventricular diastolic function could not be evaluated. Right Ventricle:  The right ventricular size is normal. No increase in right ventricular wall thickness. Right ventricular systolic function is normal. Left Atrium: Left atrial size was normal in size. Right Atrium: Right atrial size was normal in size. Pericardium: There is no evidence of pericardial effusion. Mitral Valve: The mitral valve is normal in structure. Mild mitral valve regurgitation. MV peak gradient, 4.6 mmHg. The mean mitral valve gradient is 2.0 mmHg. Tricuspid Valve: The tricuspid valve is normal in structure. Tricuspid valve regurgitation is trivial. Aortic Valve: The aortic  valve is normal in structure. Aortic valve regurgitation is not visualized. Aortic valve mean gradient measures 2.0 mmHg. Aortic valve peak gradient measures 3.9 mmHg. Pulmonic Valve: The pulmonic valve was normal in structure. Pulmonic valve regurgitation is not visualized. Aorta: The aortic root and ascending aorta are structurally normal, with no evidence of dilitation. IAS/Shunts: No atrial level shunt detected by color flow Doppler.   LV Volumes (MOD) LV vol d, MOD A2C: 60.9 ml Diastology LV vol d, MOD A4C: 78.5 ml LV e' medial:    4.68 cm/s LV vol s, MOD A2C: 38.8 ml LV E/e' medial:  14.1 LV vol s, MOD A4C: 48.1 ml LV e' lateral:   13.60 cm/s LV SV MOD A2C:     22.1 ml LV E/e' lateral: 4.9 LV SV MOD A4C:     78.5 ml LV SV MOD BP:      27.9 ml RIGHT VENTRICLE RV Basal diam:  3.00 cm LEFT ATRIUM             Index       RIGHT ATRIUM          Index LA Vol (A2C):   18.8 ml 10.87 ml/m RA Area:     8.18 cm LA Vol (A4C):   24.2 ml 13.99 ml/m RA Volume:   13.60 ml 7.86 ml/m LA Biplane Vol: 22.6 ml 13.06 ml/m  AORTIC VALVE AV Vmax:           98.60 cm/s AV Vmean:          65.400 cm/s AV VTI:            0.154 m AV Peak Grad:      3.9 mmHg AV Mean Grad:      2.0 mmHg LVOT Vmax:         89.40 cm/s LVOT Vmean:        59.300 cm/s LVOT VTI:          0.133 m LVOT/AV VTI ratio: 0.86 MITRAL VALVE MV Area (PHT): 3.31 cm    SHUNTS MV Peak grad:  4.6 mmHg    Systemic VTI: 0.13 m MV Mean grad:  2.0 mmHg MV Vmax:       1.07 m/s MV Vmean:      60.9 cm/s MV Decel Time: 229 msec MV E velocity: 66.00 cm/s MV A velocity: 93.00 cm/s MV E/A ratio:  0.71 Arnoldo Hooker MD Electronically signed by Arnoldo Hooker MD Signature Date/Time: 07/04/2020/7:52:02 AM    Final      Medications:   . sodium chloride 100 mL/hr at 07/05/20 0500  . sodium chloride 10 mL/hr at July 14, 2020 0809  . sodium chloride    . ampicillin-sulbactam (UNASYN) IV Stopped (07/04/20 2201)  . anticoagulant sodium citrate    . famotidine (PEPCID) IV    . fentaNYL  infusion INTRAVENOUS Stopped (07/03/20 1400)  . magnesium sulfate bolus IVPB    . nitroGLYCERIN    . norepinephrine (LEVOPHED) Adult infusion Stopped (07/04/20  1800)   . sodium chloride   Intravenous Once  . aspirin EC  81 mg Oral Q0600  . calcium carbonate (dosed in mg elemental calcium)  500 mg of elemental calcium Per Tube BID  . Chlorhexidine Gluconate Cloth  6 each Topical Once  . collagenase  1 application Topical Daily  . docusate  100 mg Per Tube Daily  . hydrocortisone sod succinate (SOLU-CORTEF) inj  50 mg Intravenous Q6H  . insulin aspart  0-20 Units Subcutaneous Q4H  . insulin detemir  6 Units Subcutaneous BID  . mouth rinse  15 mL Mouth Rinse BID  . midodrine  10 mg Per Tube TID WC   sodium chloride, acetaminophen **OR** acetaminophen, alum & mag hydroxide-simeth, anticoagulant sodium citrate, guaiFENesin-dextromethorphan, hydrALAZINE, HYDROmorphone (DILAUDID) injection, labetalol, magnesium sulfate bolus IVPB, metoprolol tartrate, morphine injection, ondansetron (ZOFRAN) IV, ondansetron, oxyCODONE-acetaminophen, phenol, potassium chloride, senna-docusate, sorbitol, white petrolatum  Assessment/ Plan:  Ivan Larsen is a 65 y.o. white male with peripheral vascular disease, coronary artery disease, nephrolithiasis, hyperlipidemia, diabetes mellitus type II, who was admitted to [facility] on 15-Jul-2020 for Atherosclerosis of artery of extremity with ulceration (HCC) [I70.299, L97.909] S/P aorto-bifemoral bypass surgery [Y69.485] Underwent bilateral femoral artery bypass graft by Dr. Wyn Quaker and Dr. Gilda Crease. on 2020/07/15   1. Acute renal failure : with baseline creatinine of 0.9 with normal GFR on 06/25/20.  Acidosis has reversed with bicarbonate infusion.  History of glycosuria and underlying diabetic kidney disease.  AKI likely ATN due to hypotension  02/19 0701 - 02/20 0700 In: 2602.2 [I.V.:2402.4; IV Piggyback:199.9] Out: 650 [Urine:650]  Lab Results  Component Value Date    CREATININE 5.20 (H) 07/05/2020   CREATININE 4.25 (H) 07/04/2020   CREATININE 3.17 (H) 07/03/2020   Worsening Creatinine trends UOP still low although improved from yesterday Continues to have large amount of edema - Avoid hypotension - supportive care - HD today for metabolic optimization 1st HD 07/05/2020 Agree with renal artery dopplers  2. Hypotension: with acute blood loss -  - stress dose steroids Off pressors now  3. Hypernatremia - getting 1/2 NS  \monitor     LOS: 4 Aftin Lye 2/20/20228:49 AM

## 2020-07-05 NOTE — Progress Notes (Signed)
4 Days Post-Op   Subjective/Chief Complaint: Extubated yesterday. Stable overnight. Off all pressors. Complains of LEFT heel pain. Otherwise without complaint.   Objective: Vital signs in last 24 hours: Temp:  [97.5 F (36.4 C)-98.6 F (37 C)] 97.8 F (36.6 C) (02/20 0000) Pulse Rate:  [73-103] 103 (02/20 0600) Resp:  [11-27] 20 (02/20 0600) BP: (75-135)/(32-85) 135/52 (02/20 0600) SpO2:  [92 %-99 %] 98 % (02/20 0600) Weight:  [80.4 kg] 80.4 kg (02/20 0400) Last BM Date: 06/28/20  Intake/Output from previous day: 02/19 0701 - 02/20 0700 In: 2602.2 [I.V.:2402.4; IV Piggyback:199.9] Out: 650 [Urine:650] Intake/Output this shift: No intake/output data recorded.  General appearance: alert Resp: clear to auscultation bilaterally Cardio: regular rate and rhythm GI: soft, mild distension, NT, incision- C/D/I, groin incisions scant serous drainage Extremities: warm, 2+edema, +PT/DP signals, feet warm, pink  Lab Results:  Recent Labs    07/04/20 0419 07/04/20 1053 07/05/20 0443  WBC 25.2*  --  11.2*  HGB 8.6* 8.3* 7.9*  HCT 26.6* 25.0* 24.0*  PLT 120*  --  112*   BMET Recent Labs    07/04/20 0419 07/05/20 0443  NA 147* 144  K 4.4 4.2  CL 101 99  CO2 32 27  GLUCOSE 112* 115*  BUN 63* 79*  CREATININE 4.25* 5.20*  CALCIUM 6.0* 6.2*   PT/INR No results for input(s): LABPROT, INR in the last 72 hours. ABG Recent Labs    07/03/20 0500 07/04/20 0344  PHART 7.61* 7.52*  HCO3 35.2* 31.8*    Studies/Results: DG Chest Port 1 View  Result Date: 07/04/2020 CLINICAL DATA:  Acute respiratory failure with hypoxia EXAM: PORTABLE CHEST 1 VIEW COMPARISON:  07/02/2020 FINDINGS: *Endotracheal tube tip terminates in the mid to lower trachea, 2.6 cm from the expected location of the carina. *Transesophageal tube coils in the left upper quadrant prior to terminating in the region of the gastric body. *Transesophageal temperature probe terminates in the cervical esophagus *Left  IJ approach central venous catheter tip terminates in the lower SVC. *Right IJ approach venous catheter tip terminates at the superior cavoatrial junction. *Telemetry leads and external support devices overlie the chest. Streaky opacities in the lung bases, possibly atelectatic though airspace disease or edema could have a similar appearance particularly in the left lung base. Partial obscuration of the left hemidiaphragm may reflect some layering effusion or basilar consolidation. No right effusion. No pneumothorax. Stable cardiomediastinal contours. No acute osseous or soft tissue abnormality. Degenerative changes are present in the imaged spine and shoulders. IMPRESSION: 1. Lines and tubes as above. 2. Increasing streaky opacities in the lung bases, possibly atelectatic though airspace disease or edema could have a similar appearance particularly given some more hazy opacity in the left lung base partially obscuring the left hemidiaphragm. Electronically Signed   By: Kreg Shropshire M.D.   On: 07/04/2020 02:12   ECHOCARDIOGRAM COMPLETE  Result Date: 07/04/2020    ECHOCARDIOGRAM REPORT   Patient Name:   Ivan Larsen [facility] Date of Exam: 07/03/2020 Medical Rec #:  [id number]         Height:       65.0 in Accession #:    [id number]        Weight:       145.9 lb Date of Birth:  09/29/1955         BSA:          1.730 m Patient Age:    65 years          BP:  94/62 mmHg Patient Gender: M                 HR:           63 bpm. Exam Location:  [facility] Procedure: 2D Echo, Color Doppler, Cardiac Doppler and Intracardiac            Opacification Agent Indications:     Shock  History:         Patient has no prior history of Echocardiogram examinations.                  CAD, PAD; Risk Factors:Dyslipidemia and Diabetes.  Sonographer:     Humphrey Rolls RDCS (AE) Referring Phys:  [id number] Judithe Modest Diagnosing Phys: Arnoldo Hooker MD  Sonographer Comments: Echo performed with patient supine and on artificial respirator, no  parasternal window, suboptimal apical window and no subcostal window. Image acquisition challenging due to respiratory motion. IMPRESSIONS  1. Left ventricular ejection fraction, by estimation, is 50 to 55%. The left ventricle has low normal function. The left ventricle has no regional wall motion abnormalities. Left ventricular diastolic function could not be evaluated.  2. Right ventricular systolic function is normal. The right ventricular size is normal.  3. The mitral valve is normal in structure. Mild mitral valve regurgitation.  4. The aortic valve is normal in structure. Aortic valve regurgitation is not visualized. FINDINGS  Left Ventricle: Left ventricular ejection fraction, by estimation, is 50 to 55%. The left ventricle has low normal function. The left ventricle has no regional wall motion abnormalities. The left ventricular internal cavity size was normal in size. There is no left ventricular hypertrophy. Left ventricular diastolic function could not be evaluated. Right Ventricle: The right ventricular size is normal. No increase in right ventricular wall thickness. Right ventricular systolic function is normal. Left Atrium: Left atrial size was normal in size. Right Atrium: Right atrial size was normal in size. Pericardium: There is no evidence of pericardial effusion. Mitral Valve: The mitral valve is normal in structure. Mild mitral valve regurgitation. MV peak gradient, 4.6 mmHg. The mean mitral valve gradient is 2.0 mmHg. Tricuspid Valve: The tricuspid valve is normal in structure. Tricuspid valve regurgitation is trivial. Aortic Valve: The aortic valve is normal in structure. Aortic valve regurgitation is not visualized. Aortic valve mean gradient measures 2.0 mmHg. Aortic valve peak gradient measures 3.9 mmHg. Pulmonic Valve: The pulmonic valve was normal in structure. Pulmonic valve regurgitation is not visualized. Aorta: The aortic root and ascending aorta are structurally normal, with no  evidence of dilitation. IAS/Shunts: No atrial level shunt detected by color flow Doppler.   LV Volumes (MOD) LV vol d, MOD A2C: 60.9 ml Diastology LV vol d, MOD A4C: 78.5 ml LV e' medial:    4.68 cm/s LV vol s, MOD A2C: 38.8 ml LV E/e' medial:  14.1 LV vol s, MOD A4C: 48.1 ml LV e' lateral:   13.60 cm/s LV SV MOD A2C:     22.1 ml LV E/e' lateral: 4.9 LV SV MOD A4C:     78.5 ml LV SV MOD BP:      27.9 ml RIGHT VENTRICLE RV Basal diam:  3.00 cm LEFT ATRIUM             Index       RIGHT ATRIUM          Index LA Vol (A2C):   18.8 ml 10.87 ml/m RA Area:     8.18 cm LA Vol (A4C):  24.2 ml 13.99 ml/m RA Volume:   13.60 ml 7.86 ml/m LA Biplane Vol: 22.6 ml 13.06 ml/m  AORTIC VALVE AV Vmax:           98.60 cm/s AV Vmean:          65.400 cm/s AV VTI:            0.154 m AV Peak Grad:      3.9 mmHg AV Mean Grad:      2.0 mmHg LVOT Vmax:         89.40 cm/s LVOT Vmean:        59.300 cm/s LVOT VTI:          0.133 m LVOT/AV VTI ratio: 0.86 MITRAL VALVE MV Area (PHT): 3.31 cm    SHUNTS MV Peak grad:  4.6 mmHg    Systemic VTI: 0.13 m MV Mean grad:  2.0 mmHg MV Vmax:       1.07 m/s MV Vmean:      60.9 cm/s MV Decel Time: 229 msec MV E velocity: 66.00 cm/s MV A velocity: 93.00 cm/s MV E/A ratio:  0.71 Arnoldo Hooker MD Electronically signed by Arnoldo Hooker MD Signature Date/Time: 07/04/2020/7:52:02 AM    Final     Anti-infectives: Anti-infectives (From admission, onward)   Start     Dose/Rate Route Frequency Ordered Stop   07/03/20 2200  Ampicillin-Sulbactam (UNASYN) 3 g in sodium chloride 0.9 % 100 mL IVPB        3 g 200 mL/hr over 30 Minutes Intravenous Every 12 hours 07/03/20 1230     07/03/20 0400  Ampicillin-Sulbactam (UNASYN) 3 g in sodium chloride 0.9 % 100 mL IVPB  Status:  Discontinued        3 g 200 mL/hr over 30 Minutes Intravenous Every 6 hours 07/03/20 0314 07/03/20 1230   07/13/2020 2200  ceFAZolin (ANCEF) IVPB 2g/100 mL premix        2 g 200 mL/hr over 30 Minutes Intravenous Every 8 hours 07/02/2020  1630 07/02/20 0716   07/12/2020 0702  ceFAZolin (ANCEF) 2-4 GM/100ML-% IVPB       Note to Pharmacy: Register, Karen   : cabinet override      07/07/2020 0702 06/30/2020 0916   07/06/2020 0600  ceFAZolin (ANCEF) IVPB 2g/100 mL premix        2 g 200 mL/hr over 30 Minutes Intravenous On call to O.R. 06/30/20 2354 07/04/2020 1340      Assessment/Plan: s/p Procedure(s): AORTA BIFEMORAL BYPASS GRAFT (VERSUS BILATERAL FEMORAL ENDARTERECTOMIES) (N/A) APPLICATION OF CELL SAVER (N/A) May require HD  Continue supportive care Begin PT Appreciate Intensivist/Nephrology input-care  LOS: 4 days    Eli Hose A 07/05/2020

## 2020-07-06 ENCOUNTER — Encounter: Payer: Self-pay | Admitting: Vascular Surgery

## 2020-07-06 ENCOUNTER — Inpatient Hospital Stay: Payer: Medicare Other

## 2020-07-06 DIAGNOSIS — Z7189 Other specified counseling: Secondary | ICD-10-CM | POA: Diagnosis not present

## 2020-07-06 DIAGNOSIS — Z95828 Presence of other vascular implants and grafts: Secondary | ICD-10-CM | POA: Diagnosis not present

## 2020-07-06 DIAGNOSIS — J189 Pneumonia, unspecified organism: Secondary | ICD-10-CM | POA: Diagnosis not present

## 2020-07-06 DIAGNOSIS — J9601 Acute respiratory failure with hypoxia: Secondary | ICD-10-CM | POA: Diagnosis not present

## 2020-07-06 DIAGNOSIS — Z515 Encounter for palliative care: Secondary | ICD-10-CM

## 2020-07-06 DIAGNOSIS — R197 Diarrhea, unspecified: Secondary | ICD-10-CM | POA: Diagnosis not present

## 2020-07-06 DIAGNOSIS — I70299 Other atherosclerosis of native arteries of extremities, unspecified extremity: Secondary | ICD-10-CM | POA: Diagnosis not present

## 2020-07-06 LAB — CULTURE, BLOOD (ROUTINE X 2)
Culture: NO GROWTH
Culture: NO GROWTH
Special Requests: ADEQUATE

## 2020-07-06 LAB — COMPREHENSIVE METABOLIC PANEL
ALT: 70 U/L — ABNORMAL HIGH (ref 0–44)
AST: 262 U/L — ABNORMAL HIGH (ref 15–41)
Albumin: 2.1 g/dL — ABNORMAL LOW (ref 3.5–5.0)
Alkaline Phosphatase: 131 U/L — ABNORMAL HIGH (ref 38–126)
Anion gap: 16 — ABNORMAL HIGH (ref 5–15)
BUN: 73 mg/dL — ABNORMAL HIGH (ref 8–23)
CO2: 28 mmol/L (ref 22–32)
Calcium: 6.8 mg/dL — ABNORMAL LOW (ref 8.9–10.3)
Chloride: 100 mmol/L (ref 98–111)
Creatinine, Ser: 4.68 mg/dL — ABNORMAL HIGH (ref 0.61–1.24)
GFR, Estimated: 13 mL/min — ABNORMAL LOW (ref >60)
Glucose, Bld: 145 mg/dL — ABNORMAL HIGH (ref 70–99)
Potassium: 3.7 mmol/L (ref 3.5–5.1)
Sodium: 144 mmol/L (ref 135–145)
Total Bilirubin: 1.6 mg/dL — ABNORMAL HIGH (ref 0.3–1.2)
Total Protein: 4.5 g/dL — ABNORMAL LOW (ref 6.5–8.1)

## 2020-07-06 LAB — BLOOD GAS, ARTERIAL
Acid-Base Excess: 4.4 mmol/L — ABNORMAL HIGH (ref 0.0–2.0)
Bicarbonate: 28.4 mmol/L — ABNORMAL HIGH (ref 20.0–28.0)
FIO2: 0.4
O2 Saturation: 94.5 %
Patient temperature: 37
pCO2 arterial: 39 mmHg (ref 32.0–48.0)
pH, Arterial: 7.47 — ABNORMAL HIGH (ref 7.350–7.450)
pO2, Arterial: 68 mmHg — ABNORMAL LOW (ref 83.0–108.0)

## 2020-07-06 LAB — GLUCOSE, CAPILLARY
Glucose-Capillary: 69 mg/dL — ABNORMAL LOW (ref 70–99)
Glucose-Capillary: 96 mg/dL (ref 70–99)
Glucose-Capillary: 108 mg/dL — ABNORMAL HIGH (ref 70–99)
Glucose-Capillary: 118 mg/dL — ABNORMAL HIGH (ref 70–99)
Glucose-Capillary: 133 mg/dL — ABNORMAL HIGH (ref 70–99)
Glucose-Capillary: 128 mg/dL — ABNORMAL HIGH (ref 70–99)

## 2020-07-06 LAB — CBC
HCT: 21 % — ABNORMAL LOW (ref 39.0–52.0)
Hemoglobin: 7 g/dL — ABNORMAL LOW (ref 13.0–17.0)
MCH: 27.2 pg (ref 26.0–34.0)
MCHC: 33.3 g/dL (ref 30.0–36.0)
MCV: 81.7 fL (ref 80.0–100.0)
Platelets: 108 10*3/uL — ABNORMAL LOW (ref 150–400)
RBC: 2.57 MIL/uL — ABNORMAL LOW (ref 4.22–5.81)
WBC: 10.9 10*3/uL — ABNORMAL HIGH (ref 4.0–10.5)
nRBC: 8.8 % — ABNORMAL HIGH (ref 0.0–0.2)

## 2020-07-06 LAB — PHOSPHORUS: Phosphorus: 6.6 mg/dL — ABNORMAL HIGH (ref 2.5–4.6)

## 2020-07-06 LAB — LACTIC ACID, PLASMA: Lactic Acid, Venous: 2.3 mmol/L (ref 0.5–1.9)

## 2020-07-06 LAB — PREPARE RBC (CROSSMATCH)

## 2020-07-06 LAB — MAGNESIUM: Magnesium: 2.2 mg/dL (ref 1.7–2.4)

## 2020-07-06 IMAGING — DX DG CHEST 1V PORT
1 series · 1 of 1 positions shown · non-contrast
Comparison: [DATE]

CLINICAL DATA: 64-year-old male with a history of shortness of
breath

EXAM:
PORTABLE CHEST 1 VIEW

[chest ap]
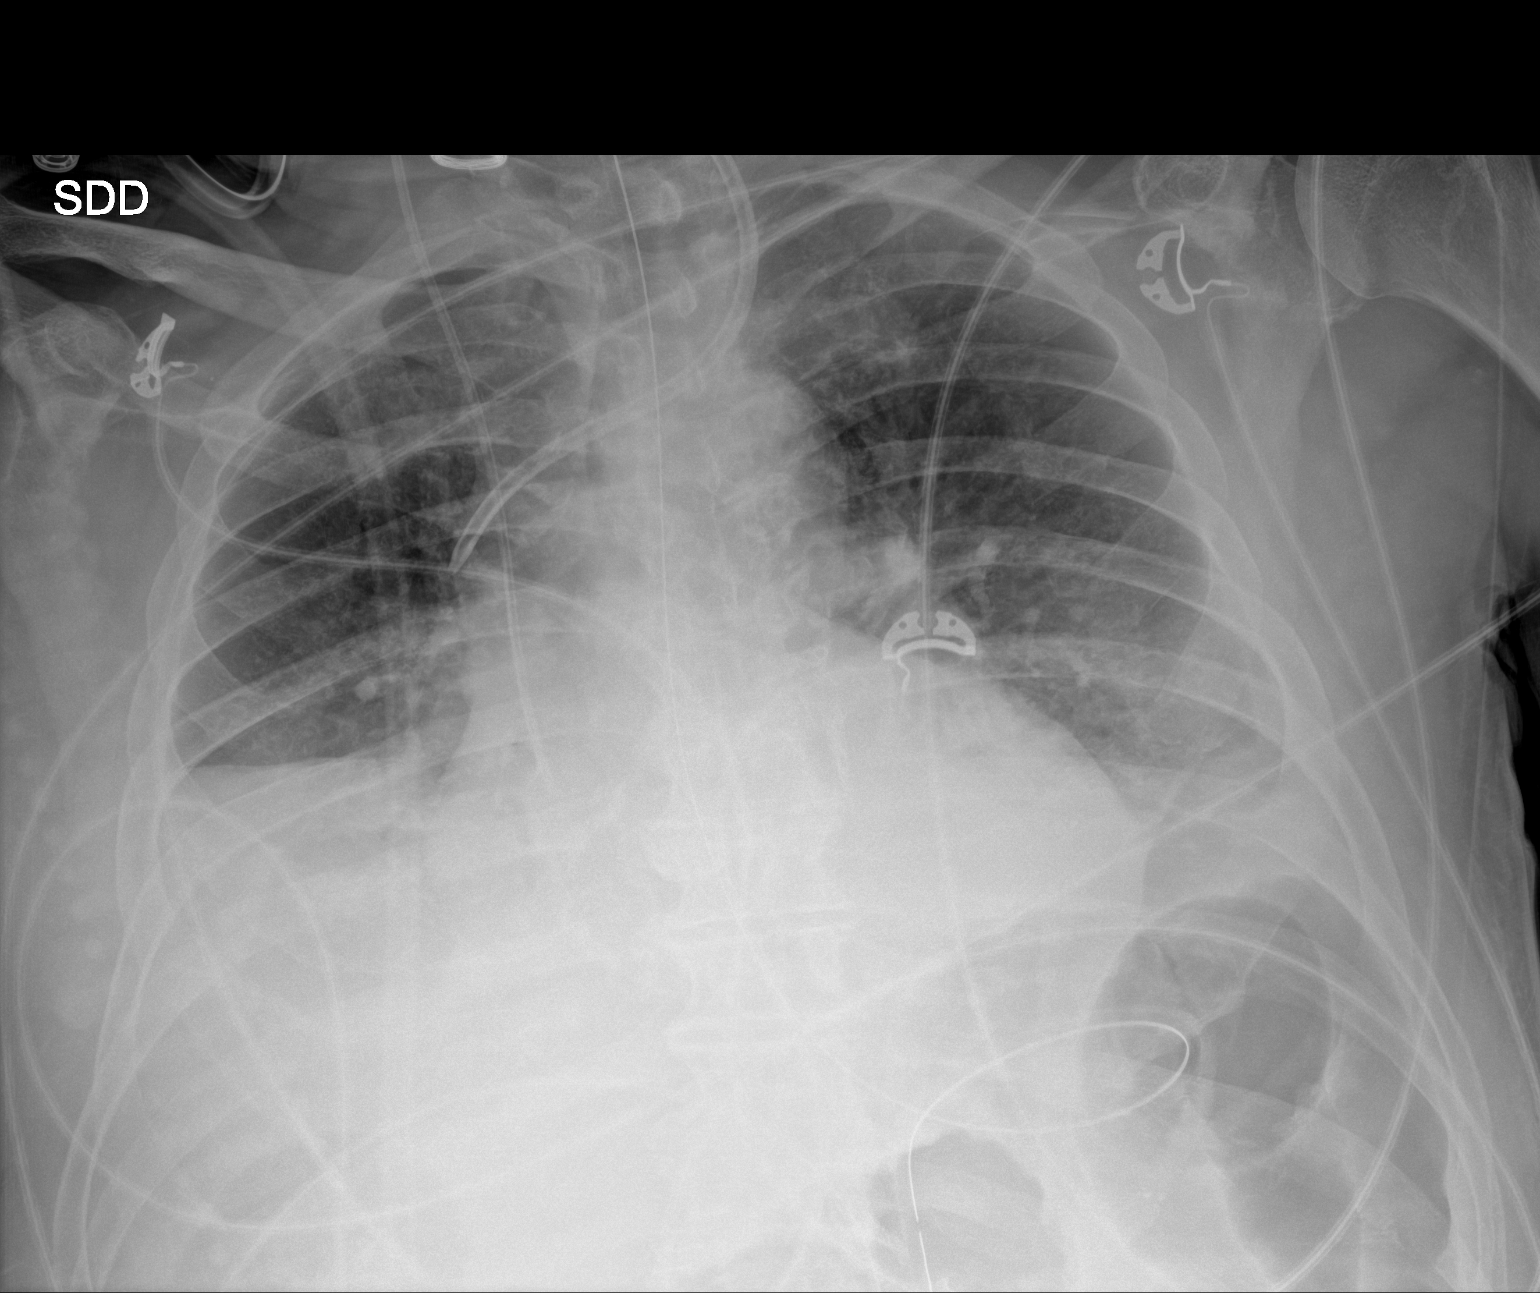

[1 of 1 positions shown; findings below may reference images not displayed]

FINDINGS: Cardiomediastinal silhouette unchanged, with the heart borders
partially obscured by overlying lung/pleural disease.

Low lung volumes persist. Interval extubation. Opacities at the
bilateral lung bases partially obscuring the hemidiaphragms.
Blunting of the left costophrenic angle. No significant interlobular
septal thickening.

Air bronchograms at the base of the right lung.

Unchanged right IJ central venous catheter which terminates at the
superior cavoatrial junction.

Left IJ hemodialysis catheter appears to terminate superior vena
cava.

Gastric tube traverses the mediastinum terminating in the left upper
quadrant out of the field of view.

Overlying EKG leads.
IMPRESSION: Interval extubation.

Low lung volumes persist with bibasilar opacities, potentially
combination of atelectasis/consolidation, and/or pleural fluid.

Unchanged right IJ central catheter. Left IJ hemodialysis catheter,
and gastric tube.

## 2020-07-06 IMAGING — DX DG ABDOMEN 1V
1 series · 1 of 1 positions shown · non-contrast
Comparison: [DATE]

CLINICAL DATA: Nasogastric tube placement

EXAM:
ABDOMEN - 1 VIEW

[abdomen supine]
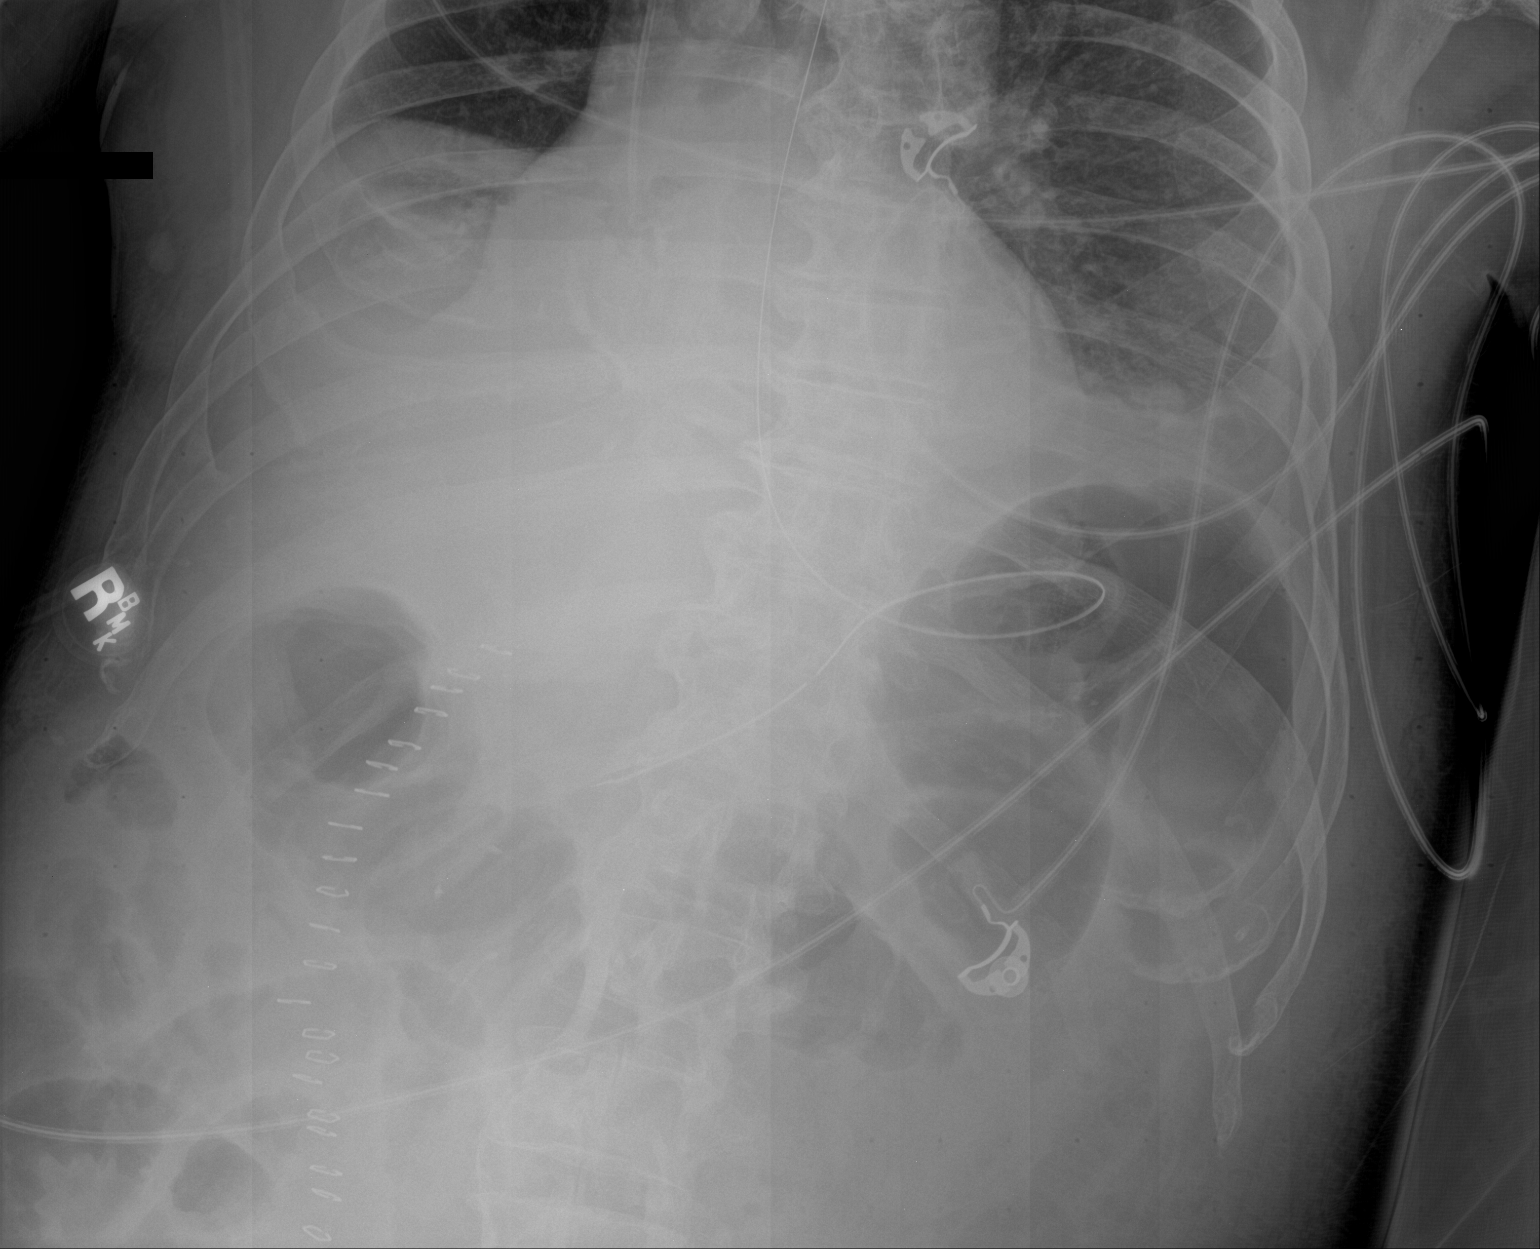

[1 of 1 positions shown; findings below may reference images not displayed]

FINDINGS: Nasogastric tube tip and side port are in the stomach. There are
loops of dilated bowel without appreciable air-fluid level. No
evident free air. Lung bases clear. Skin staples to the right of
midline noted.
IMPRESSION: Nasogastric tube tip and side port in stomach. Suspect a degree of
bowel ileus. No evident free air on semi-erect image.

## 2020-07-06 MED ORDER — VITAL HIGH PROTEIN PO LIQD: 1000.0000 mL | ORAL | Status: DC

## 2020-07-06 MED ORDER — FUROSEMIDE 10 MG/ML IJ SOLN
40.0000 mg | Freq: Once | INTRAMUSCULAR | Status: AC
  Administered 2020-07-06: 40 mg via INTRAVENOUS
  Filled 2020-07-06: qty 4

## 2020-07-06 MED ORDER — SODIUM CHLORIDE 0.9 % IV SOLN
3.0000 g | INTRAVENOUS | Status: DC
  Administered 2020-07-06: 3 g via INTRAVENOUS
  Filled 2020-07-06 (×2): qty 8

## 2020-07-06 MED ORDER — PROSOURCE TF PO LIQD
45.0000 mL | Freq: Three times a day (TID) | ORAL | Status: DC
  Administered 2020-07-06: 45 mL
  Filled 2020-07-06: qty 45

## 2020-07-06 MED ORDER — PROSOURCE TF PO LIQD
45.0000 mL | Freq: Two times a day (BID) | ORAL | Status: DC
  Administered 2020-07-06: 45 mL

## 2020-07-06 MED ORDER — JUVEN PO PACK: 1.0000 | PACK | Freq: Two times a day (BID) | ORAL | Status: DC

## 2020-07-06 MED ORDER — MORPHINE SULFATE (PF) 2 MG/ML IV SOLN
2.0000 mg | INTRAVENOUS | Status: DC | PRN
Start: 2020-07-06 — End: 2020-07-11
  Administered 2020-07-06 – 2020-07-11 (×4): 2 mg via INTRAVENOUS
  Filled 2020-07-06 (×5): qty 1

## 2020-07-06 MED ORDER — DEXTROSE 50 % IV SOLN
INTRAVENOUS | Status: AC
  Administered 2020-07-06: 25 mL via INTRAVENOUS
  Filled 2020-07-06: qty 50

## 2020-07-06 MED ORDER — DEXTROSE-NACL 5-0.45 % IV SOLN: INTRAVENOUS | Status: DC

## 2020-07-06 MED ORDER — DEXTROSE 50 % IV SOLN: 25.0000 mL | Freq: Once | INTRAVENOUS | Status: AC

## 2020-07-06 MED ORDER — DEXTROSE 10 % IV SOLN: INTRAVENOUS | Status: DC

## 2020-07-06 MED ORDER — OSMOLITE 1.5 CAL PO LIQD: 1000.0000 mL | ORAL | Status: DC

## 2020-07-06 NOTE — Progress Notes (Signed)
NAME:  Ivan Larsen, MRN:  [id number], DOB:  Jan 30, 1956, LOS: 5 ADMISSION DATE:  July 06, 2020, INITIAL CONSULTATION DATE:  06-Jul-2020 REFERRING MD:  Dr. Wyn Quaker, CHIEF COMPLAINT:  Hemorrhagic shock   Brief History:  65 yo male with hx of severe peripheral vascular disease, dm, dyslipidemia who underwent elective Aortobifemoral bypass on Jul 06, 2020. Intraop he lost apx 2L blood with 1/2 returned via blood saver, received IVF fluids.  After surgery patient was in circulatory shock and was emergently brought to MICU due to medical instability. He required vasopressor support perioperatively.        Past Medical History:  Peripheral artery disease Myocardial infarction Hyperlipidemia Kidney stones Elevated liver enzyme Diabetes mellitus Coronary artery disease  Significant Hospital Events:  2/16: Underwent elective aortobifemoral bypass; intraoperative blood loss with hemorrhagic shock: Transferred to ICU post procedure remained intubated 2/17: AKI, nephrology consulted, temporary dialysis catheter placed  Consults:  Vascular surgery Nephrology PCCM   Procedures:  2/16: Aortofemoral bypass 2/16: Right IJ CVC placed 2/17: Left IJ temporary dialysis catheter placed  Significant Diagnostic Tests:  2/17: Renal ultrasound>Question medical renal disease changes of both kidneys. No evidence of renal mass or hydronephrosis.Minimal ascites. 2/18: KUB>>Gas throughout mildly prominent large and small bowel loops may reflect mild ileus. NG tube in the stomach. 2/18: Echocardiogram>>LVEF 50-55% no wall motion abnl.  Micro Data:  2/14: SARS-CoV-2 PCR>> negative 2/16: MRSA PCR>> negative 2/16: Blood culture x2>> 2/18: Tracheal aspirate>>  Antimicrobials:  Cefazolin 2/16>> 2/17 (surgical prophylaxis). Unasyn 2/18>>  Interim History / Subjective:  Tolerating SBT, follows commands   Objective   Blood pressure (!) 155/69, pulse (!) 105, temperature 97.7 F (36.5 C), temperature source  Axillary, resp. rate (!) 21, height  (1.651 m), weight 80.4 kg, SpO2 97 %.    FiO2 (%):  [40 %] 40 %   Intake/Output Summary (Last 24 hours) at 07/06/2020 0954 Last data filed at 07/06/2020 0700 Gross per 24 hour  Intake 1766.32 ml  Output 920 ml  Net 846.32 ml   Filed Weights   06-Jul-2020 0801 07/06/2020 1630 07/05/20 0400  Weight: 63.5 kg 66.2 kg 80.4 kg    Examination: General: Critically ill-appearing male, laying in bed, intubated,SAT: follows commands, no acute distress HENT: Atraumatic, normocephalic, neck supple, no JVD, ET tube in place Lungs: Clear to auscultation bilaterally, synchronous with the ventilator, even Cardiovascular: Regular rate and rhythm, S1-S2, no murmurs, rubs, gallops Abdomen: Distended, tender, bowel sounds positive x4, midline abdominal honeycomb dressing clean dry and intact Extremities: No deformities, 3+ edema bilateral lower extremities GU: scrotal edema, ecchymotic,foley in place Neuro: Sedated, withdraws from pain, follows commands off sedation, pupils PERRLA     Resolved Hospital Problem list   N/A  Assessment & Plan:  RESPIRATORY: Post-op Respiratory in the setting of Shock and Metabolic Derangements -Intubated but now extubated yet requiring cyclic NIV -Follow intermittent chest x-ray and ABG as needed  Questionable pneumonia -Monitor for  fever, Tm 100.5 -Curve trend WBCs (10.9) and procalcitonin (16.3, 07/05/20) -Follow cultures as above ~will obtain tracheal aspirate today 2/18 -Continue empiric Unasyn for now  CV: Hemorrhagic shock +/-septic shock -Continuous cardiac monitoring -Maintain MAP greater than 65 -IV fluids -Blood transfusions as indicated -Vasopressors if needed weaned off -Stress dose steroids, wean -Continue Midodrine -2D echocardiogram 50-55% LVEF     HEME: Acute Blood Loss Anemia -Monitor for S/Sx of bleeding -Trend CBC (H&H q6h) -SCD's for VTE Prophylaxis (can start chemical prophylaxis when  cleared by surgery) -Transfuse for Hgb <8  RENAL: Lab Results  Component Value Date   CREATININE 4.68 (H) 07/06/2020   BUN 73 (H) 07/06/2020   NA 144 07/06/2020   K 3.7 07/06/2020   CL 100 07/06/2020   CO2 28 07/06/2020    Intake/Output Summary (Last 24 hours) at 07/06/2020 0958 Last data filed at 07/06/2020 0700 Gross per 24 hour  Intake 1766.32 ml  Output 920 ml  Net 846.32 ml   Net IO Since Admission: 14,574.66 mL [07/06/20 0958]  AKI -Monitor I&O's / urinary output -Trend renal indices, improved today  -Ensure adequate renal perfusion -Avoid nephrotoxic agents as able -Replace electrolytes as indicated -Nephrology consultation appreciated -HD per consultant   Severe Peripheral Vascular Disease s/p Aortobifemoral bypass -Vascular Surgery following, will follow recommendations -Discussed with Dr. Evie Lacks -Pain control   Hyperglycemia CBG's SSI Follow ICU Hypo/Hyperglycemia protocol   Best practice (evaluated daily)  Diet: NPO Pain/Anxiety/Delirium protocol (if indicated): Fentanyl/Precedex VAP protocol (if indicated): yes, implemented DVT prophylaxis: SCD's GI prophylaxis: Pepcid Glucose control: SSI, Levemir Mobility: Bedrest Disposition:ICU  Goals of Care:  Last date of multidisciplinary goals of care discussion:07/03/2020 Family and staff present: Bedside RN and pts daughter at bedside Summary of discussion: Plan for SBT today, wean vasopressors as able, continue ABX for potential PNA Follow up goals of care discussion due: 07/04/2020 Code Status: FULL CODE  Labs   CBC: Recent Labs  Lab 2020-07-13 0741 07/13/2020 1507 07/02/20 0145 07/02/20 0753 07/03/20 0405 07/03/20 0902 07/03/20 2208 07/04/20 0419 07/04/20 1053 07/05/20 0443 07/06/20 0434  WBC 13.3*   < > 27.3*  --  20.9*  --   --  25.2*  --  11.2* 10.9*  NEUTROABS 9.5*  --   --   --   --   --   --   --   --   --   --   HGB 13.7   < > 11.9*   < > 10.0*   < > 8.9* 8.6* 8.3* 7.9* 7.0*  HCT  41.1   < > 37.1*   < > 29.2*   < > 27.6* 26.6* 25.0* 24.0* 21.0*  MCV 85.6   < > 82.6  --  79.3*  --   --  82.9  --  82.8 81.7  PLT 290   < > 158  --  141*  --   --  120*  --  112* 108*   < > = values in this interval not displayed.    Basic Metabolic Panel: Recent Labs  Lab 07/02/20 0346 07/03/20 0405 07/04/20 0419 07/05/20 0443 07/06/20 0434  NA 150* 149* 147* 144 144  K 3.4* 3.9 4.4 4.2 3.7  CL 105 100 101 99 100  CO2 12* 35* 32 27 28  GLUCOSE 234* 294* 112* 115* 145*  BUN 22 47* 63* 79* 73*  CREATININE 1.87* 3.17* 4.25* 5.20* 4.68*  CALCIUM 6.6* 6.0* 6.0* 6.2* 6.8*  MG 1.4* 2.3 2.1 2.3 2.2  PHOS 9.1* 5.4* 6.0* 7.8* 6.6*   GFR: Estimated Creatinine Clearance: 15.4 mL/min (A) (by C-G formula based on SCr of 4.68 mg/dL (H)). Recent Labs  Lab 07-13-2020 2020 13-Jul-2020 2301 07/02/20 0145 07/02/20 0346 07/02/20 0502 07/03/20 0405 07/04/20 0419 07/05/20 0443 07/06/20 0434  PROCALCITON 0.12  --   --  1.30  --  3.68 8.85 16.33  --   WBC  --   --    < >  --   --  20.9* 25.2* 11.2* 10.9*  LATICACIDVEN >11.0* >11.0*  --   --  >  11.0*  --   --   --   --    < > = values in this interval not displayed.    Liver Function Tests: Recent Labs  Lab 07/02/20 0753 07/03/20 0405 07/04/20 0419 07/05/20 0443 07/06/20 0434  AST 849* 1,152* 491* 386* 262*  ALT 777* 581* 222* 125* 70*  ALKPHOS 46 66 87 122 131*  BILITOT 0.6 0.6 0.6 1.4* 1.6*  PROT 3.9* 3.8* 3.9* 4.5* 4.5*  ALBUMIN 2.4* 2.2* 1.9* 2.0* 2.1*   No results for input(s): LIPASE, AMYLASE in the last 168 hours. No results for input(s): AMMONIA in the last 168 hours.  ABG    Component Value Date/Time   PHART 7.52 (H) 07/04/2020 0344   PCO2ART 39 07/04/2020 0344   PO2ART 58 (L) 07/04/2020 0344   HCO3 31.8 (H) 07/04/2020 0344   ACIDBASEDEF 14.7 (H) 07/02/2020 0555   O2SAT 92.6 07/04/2020 0344     Coagulation Profile: Recent Labs  Lab 07/07/2020 0741  INR 1.0    Cardiac Enzymes: Recent Labs  Lab 07/02/20 0346  07/02/20 0753  CKTOTAL 114 178    HbA1C: Hemoglobin A1C  Date/Time Value Ref Range Status  12/22/2015 12:00 AM 7.3  Final   HB A1C (BAYER DCA - WAIVED)  Date/Time Value Ref Range Status  06/02/2020 01:44 PM 6.3 <7.0 % Final    Comment:                                          Diabetic Adult            <7.0                                       Healthy Adult        4.3 - 5.7                                                           (DCCT/NGSP) American Diabetes Association's Summary of Glycemic Recommendations for Adults with Diabetes: Hemoglobin A1c <7.0%. More stringent glycemic goals (A1c <6.0%) may further reduce complications at the cost of increased risk of hypoglycemia.   02/07/2020 08:07 AM 6.4 <7.0 % Final    Comment:                                          Diabetic Adult            <7.0                                       Healthy Adult        4.3 - 5.7                                                           (  DCCT/NGSP) American Diabetes Association's Summary of Glycemic Recommendations for Adults with Diabetes: Hemoglobin A1c <7.0%. More stringent glycemic goals (A1c <6.0%) may further reduce complications at the cost of increased risk of hypoglycemia.    Hgb A1c MFr Bld  Date/Time Value Ref Range Status  07/03/2020 04:05 AM 5.7 (H) 4.8 - 5.6 % Final    Comment:    (NOTE)         Prediabetes: 5.7 - 6.4         Diabetes: >6.4         Glycemic control for adults with diabetes: <7.0   07/02/2020 03:46 AM 5.7 (H) 4.8 - 5.6 % Final    Comment:    (NOTE)         Prediabetes: 5.7 - 6.4         Diabetes: >6.4         Glycemic control for adults with diabetes: <7.0     CBG: Recent Labs  Lab 07/05/20 1530 07/05/20 1939 07/05/20 2348 07/06/20 0309 07/06/20 0726  GLUCAP 92 87 78 69* 96    Review of Systems:   Unable to assess due to intubation and sedation  Past Medical History:  He,  has a past medical history of CAD (coronary artery disease),  Diabetes mellitus without complication (HCC), Elevated liver enzymes, History of kidney stones, Hyperlipidemia, MI (myocardial infarction) (HCC) (07/01/12), and PAD (peripheral artery disease) (HCC).   Surgical History:   Past Surgical History:  Procedure Laterality Date  . AORTA - BILATERAL FEMORAL ARTERY BYPASS GRAFT N/A 06/19/2020   Procedure: AORTA BIFEMORAL BYPASS GRAFT (VERSUS BILATERAL FEMORAL ENDARTERECTOMIES);  Surgeon: Annice Needyew, Jason S, MD;  Location: [facility] ORS;  Service: Vascular;  Laterality: N/A;  . CORONARY STENT PLACEMENT    . EYE SURGERY    . LOWER EXTREMITY ANGIOGRAPHY Left 06/25/2020   Procedure: LOWER EXTREMITY ANGIOGRAPHY;  Surgeon: Annice Needyew, Jason S, MD;  Location: [facility] INVASIVE CV LAB;  Service: Cardiovascular;  Laterality: Left;     Social History:   reports that he quit smoking about 16 years ago. His smoking use included cigarettes. He has never used smokeless tobacco. He reports current alcohol use. He reports that he does not use drugs.   Family History:  His family history includes Diabetes in his mother; Heart attack in his father.   Allergies Allergies  Allergen Reactions  . Iodinated Diagnostic Agents Nausea Only    Other reaction(s): Vomiting  . Iodine   . Shellfish Allergy Nausea And Vomiting and Other (See Comments)    Sweating  Sweating    . Shellfish-Derived Products Nausea And Vomiting and Other (See Comments)    Other reaction(s): Nausea And Vomiting, Other (See Comments) Sweating  Sweating      Home Medications  Prior to Admission medications   Medication Sig Start Date End Date Taking? Authorizing Provider  amLODipine (NORVASC) 10 MG tablet Take 1 tablet (10 mg total) by mouth daily. 02/17/20  Yes Bacigalupo, Marzella SchleinAngela M, MD  atorvastatin (LIPITOR) 40 MG tablet Take 1 tablet (40 mg total) by mouth daily. 02/17/20  Yes Bacigalupo, Marzella SchleinAngela M, MD  benazepril (LOTENSIN) 40 MG tablet Take 1 tablet (40 mg total) by mouth daily. 02/17/20  Yes Bacigalupo, Marzella SchleinAngela  M, MD  collagenase (SANTYL) ointment Apply 1 application topically daily. 05/05/20  Yes Cannady, Corrie DandyJolene T, NP  empagliflozin (JARDIANCE) 25 MG TABS tablet Take 1 tablet (25 mg total) by mouth daily before breakfast. 03/20/20  Yes Valentino NoseMartinez, Jessica A, NP  gabapentin (NEURONTIN) 300  MG capsule Take 1 capsule (300 mg total) by mouth 3 (three) times daily. 05/05/20  Yes Cannady, Jolene T, NP  hydrochlorothiazide (HYDRODIURIL) 25 MG tablet Take 1 tablet (25 mg total) by mouth daily. 03/20/20  Yes Valentino Nose, NP  HYDROcodone-acetaminophen (NORCO) 5-325 MG tablet Take 1 tablet by mouth every 6 (six) hours as needed for moderate pain. 06/25/20  Yes Dew, Marlow Baars, MD  meclizine (ANTIVERT) 25 MG tablet Take 1 tablet (25 mg total) by mouth 3 (three) times daily as needed for dizziness or nausea. 08/07/19  Yes Particia Nearing, PA-C  metoprolol tartrate (LOPRESSOR) 50 MG tablet Take 1 tablet (50 mg total) by mouth 2 (two) times daily. 02/13/20  Yes Valentino Nose, NP  mupirocin ointment (BACTROBAN) 2 % Apply 1 application topically 2 (two) times daily. 06/02/20  Yes Cannady, Jolene T, NP  Omega-3 Fatty Acids (FISH OIL OMEGA-3) 1000 MG CAPS Take by mouth 2 (two) times daily.   Yes [provider]  ondansetron (ZOFRAN-ODT) 4 MG disintegrating tablet 1 TABLET ORALLY DISSOLVED EVERY 8 HOURS IF NEEDED FOR NAUSEA AND VOMITING Patient taking differently: Take 4 mg by mouth every 8 (eight) hours as needed for nausea or vomiting. 12/27/19  Yes Cathlean Marseilles A, NP  sitaGLIPtin (JANUVIA) 25 MG tablet Take 1 tablet (25 mg total) by mouth daily. 03/20/20  Yes Valentino Nose, NP  tamsulosin (FLOMAX) 0.4 MG CAPS capsule Take 1 capsule (0.4 mg total) by mouth at bedtime. Take at nighttime to minimize dizziness/hypotension 03/20/20  Yes Cathlean Marseilles A, NP  clopidogrel (PLAVIX) 75 MG tablet Take 1 tablet (75 mg total) by mouth daily. Patient not taking: No sig reported 02/13/20   Valentino Nose, NP  doxycycline (VIBRA-TABS) 100 MG tablet Take 1 tablet (100 mg total) by mouth 2 (two) times daily. Patient not taking: No sig reported 06/02/20   Aura Dials T, NP  metFORMIN (GLUCOPHAGE) 500 MG tablet Take 2 tablets (1,000 mg total) by mouth 2 (two) times daily with a meal. 03/20/20   Valentino Nose, NP     Critical care time: 40 minutes    The patient is critically ill with multiple organ systems failure and requires high complexity decision making for assessment and support, frequent evaluation and titration of therapies, application of advanced monitoring technologies and extensive interpretation of multiple databases. Critical Care Time devoted to patient care services described in this note is 40 minutes.   *This note was dictated using voice recognition software/Dragon.  Despite best efforts to proofread, errors can occur which can change the meaning.  Any change was purely unintentional.

## 2020-07-06 NOTE — Progress Notes (Signed)
Nutrition Follow-up  DOCUMENTATION CODES:   Not applicable  INTERVENTION:  Initiate Osmolite 1.5 Cal at 20 mL/hr and advance by 15 mL/hr every 12 hours to goal rate of 50 mL/hr (1200 mL goal daily volume). Also provide PROSource TF 45 mL BID per tube. Goal regimen provides 1880 kcal, 97 grams of protein, 912 mL H2O daily.  Provide Juven 1 packet per tube BID. Each packet provides 95 kcal, 7 grams L-Arginine, 7 grams L-Glutamine, 2.5 grams collagen protein, 300 mg vitamin C, 9.5 mg zinc, and other micronutrients essential for wound healing.  Monitor magnesium, potassium, and phosphorus daily for at least 3 days, MD to replete as needed, as pt is at risk for refeeding syndrome.  NUTRITION DIAGNOSIS:   Inadequate oral intake related to inability to eat as evidenced by NPO status.  Ongoing inadequate oral intake - diet is clear liquids but patient unable to safely take in PO at this time.  GOAL:   Patient will meet greater than or equal to 90% of their needs  Progressing with initiation of tube feeds.  MONITOR:   Vent status,Labs,Weight trends,I & O's,Skin  REASON FOR ASSESSMENT:   Ventilator    ASSESSMENT:   65 year old male with PMHx of CAD, PAD, DM, HLD, MI s/p aortobifemoral bypass, bilateral renal endarterectomies, aortic endarterectomy, and bilateral femoral endarterectomies on 2/16, intraoperatively lost approximately 2 liters of blood with 900 mL returned via blood saver and developed circulatory shock.  2/16 intubated 2/18 pt vomited; per abdominal x-ray possible mild ileus 2/19 extubated  Met with patient at bedside. NGT remains in place. Patient somewhat confused but reports he is thirsty. Per discussion in rounds even though patient ordered for clear liquid diet plan is for SLP evaluation today to determine safest diet/liquid consistency for patient. Plan is to initiate tube feeds via NGT if unsafe for oral intake per SLP recommendations.  Medications reviewed and  include: Colace 100 mg daily per tube, PROSource TF 45 mL TID per tube (appears to have been ordered by NP this AM), Solu-Cortef 50 mg Q6hrs IV, Novolog 0-20 units Q4hrs, Levemir 6 units BID, Unasyn, D10 at 15 mL/hr, famotidine.  Labs reviewed: CBG 96-108, BUN 73, Creatinine 4.68, Anion gap 16, Phosphorus 6.6.  I/O: 895 mL UOP yesterday (0.5 mL/kg/hr); 75 mL output from NGT yesterday  Weight trend: 80.4 kg on 2/20; pt was 66.2 kg on 2/16 which appears to align closer to weights PTA  Enteral Access: 16 Fr. NGT placed 2/16; coils in stomach per abdominal x-ray 2/18  Received secure chat from [place] at approximately 1325. Plan is to proceed with initiation of tube feeds.  Diet Order:   Diet Order            Diet NPO time specified Except for: Ice Chips  Diet effective now                EDUCATION NEEDS:   No education needs have been identified at this time  Skin:  Skin Assessment: Skin Integrity Issues: Skin Integrity Issues:: DTI,Unstageable,Incisions DTI: right buttocks Unstageable: left heel Incisions: closed incisions to groin and abdomen  Last BM:  07/06/2020 - large type 7  Height:   Ht Readings from Last 1 Encounters:  06/26/2020 5' 5"  (1.651 m)   Weight:   Wt Readings from Last 1 Encounters:  07/05/20 80.4 kg   BMI:  Body mass index is 29.5 kg/m.  Estimated Nutritional Needs:   Kcal:  1800-2000  Protein:  95-110 grams  Fluid:  2 L/day  Jacklynn Barnacle, MS, RD, LDN Pager number available on Amion

## 2020-07-06 NOTE — Consult Note (Signed)
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  Patient has been revascularized.  Debridement may promote healing at this time. Will implement enzymatic debridement.   Wound type: vascular Pressure Injury POA: Yes Measurement: 2 cm x 1 cm   Wound bed:100% brown eschar Drainage (amount, consistency, odor) minimal serosanguinous  Musty odor Periwound :  Intact  Dressing procedure/placement/frequency: Cleanse left heel with NS and pat dry.  Apply Santyl ointment to wound bed.  Cover with NS moist gauze.  Secure with dry gauze and kerlix  Change daily.  Will not follow at this time.  Please re-consult if needed.  Maple Hudson MSN, RN, FNP-BC CWON Wound, Ostomy, Continence Nurse Pager [phone]

## 2020-07-06 NOTE — Evaluation (Signed)
Occupational Therapy Evaluation Patient Details Name: Ivan Larsen MRN: [id number] DOB: 1955/05/17 Today's Date: 07/06/2020    History of Present Illness 65 yo male with hx of severe peripheral vascular disease, dm, dyslipidemia who underwent elective Aortobifemoral bypass on 07/13/2020. Intraop he lost apx 2L blood with 1/2 returned via blood saver, received IVF fluids.  After surgery patient was in circulatory shock and was emergently brought to MICU due to medical instability. He required vasopressor support perioperatively.   Clinical Impression    Patient presenting with decreased I in self care, balance, functional mobility/transfers, endurance, and safety awareness. 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 Pt reports living with wife at home independently and assisting wife as needed PTA. Pt is very fatigued with any aspects of self care tasks. Pt did verbalize he was incontinent of bowel during evaluation. Rectal tube not working properly and pt needing total A for rolling L <> R to change bed linens and +2 assist for hygiene. Pt outwardly groaning with 10/10 c/o pain in abdomen and just not feeling well "all over". Pt repositioned at end of session with evaluation limited secondary to incontinence, pain, and pt fatiguing quickly with self care tasks this session. Pt will likely need higher level of care at discharge to address functional deficits. Patient will benefit from acute OT to increase overall independence in the areas of ADLs, functional mobility, and safety awareness in order to safely discharge to next venue of care.  Follow Up Recommendations  SNF;Supervision/Assistance - 24 hour    Equipment Recommendations  Other (comment) (defer to next venue of care)       Precautions / Restrictions Precautions Precautions: Fall      Mobility Bed Mobility Overal bed mobility: Needs Assistance Bed Mobility: Rolling Rolling: Total assist               Transfers                 General transfer comment: not attempted secondary to 10/10 abdominal pain        ADL either performed or assessed with clinical judgement   ADL Overall ADL's : Needs assistance/impaired     Grooming: Wash/dry hands;Wash/dry face;Bed level;Minimal assistance                                 General ADL Comments: total A for hygiene and +2 assistance for bed mobility this session     Vision Baseline Vision/History: Wears glasses Wears Glasses: Reading only Patient Visual Report: No change from baseline              Pertinent Vitals/Pain Pain Assessment: Faces Faces Pain Scale: Hurts worst Pain Location: abdomen Pain Descriptors / Indicators: Discomfort;Grimacing;Guarding;Operative site guarding;Sharp Pain Intervention(s): Limited activity within patient's tolerance;Monitored during session;Repositioned;Other (comment) (RN in room)     Hand Dominance Right   Extremity/Trunk Assessment Upper Extremity Assessment Upper Extremity Assessment: Generalized weakness   Lower Extremity Assessment Lower Extremity Assessment: Generalized weakness       Communication Communication Communication: No difficulties   Cognition Arousal/Alertness: Awake/alert Behavior During Therapy: WFL for tasks assessed/performed Overall Cognitive Status: Within Functional Limits for tasks assessed                                 General Comments: Pt with very soft voice  Home Living Family/patient expects to be discharged to:: Private residence Living Arrangements: Spouse/significant other Available Help at Discharge: Family;Available PRN/intermittently Type of Home: House       Home Layout: Two level     Bathroom Shower/Tub: Runner, broadcasting/film/video: None   Additional Comments: 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 and he assists his wife at home as needed       Prior Functioning/Environment Level of Independence: Independent                 OT Problem List: Decreased strength;Decreased activity tolerance;Impaired balance (sitting and/or standing);Decreased coordination;Decreased safety awareness;Decreased knowledge of use of DME or AE;Decreased knowledge of precautions;Pain      OT Treatment/Interventions: Self-care/ADL training;Therapeutic exercise;Energy conservation;DME and/or AE instruction;Manual therapy;Therapeutic activities;Balance training;Patient/family education    OT Goals(Current goals can be found in the care plan section) Acute Rehab OT Goals Patient Stated Goal: to stop hurting OT Goal Formulation: With patient Time For Goal Achievement: 07/20/20 Potential to Achieve Goals: Fair  OT Frequency: Min 2X/week   Barriers to D/C: Other (comment)  none known at this time          AM-PAC OT "6 Clicks" Daily Activity     Outcome Measure Help from another person eating meals?: A Little Help from another person taking care of personal grooming?: A Little Help from another person toileting, which includes using toliet, bedpan, or urinal?: Total Help from another person bathing (including washing, rinsing, drying)?: Total Help from another person to put on and taking off regular upper body clothing?: A Lot Help from another person to put on and taking off regular lower body clothing?: Total 6 Click Score: 11   End of Session Nurse Communication: Mobility status  Activity Tolerance: Patient limited by pain Patient left: in bed;with call bell/phone within reach;with nursing/sitter in room;with family/visitor present  OT Visit Diagnosis: Unsteadiness on feet (R26.81);Muscle weakness (generalized) (M62.81);Pain Pain - part of body:  (abdomen)                Time: 5361-4431 OT Time Calculation (min): 31 min Charges:  OT General Charges $OT Visit: 1 Visit OT Evaluation $OT Eval Low Complexity: 1 Low OT Treatments $Self  Care/Home Management : 8-22 mins  Jackquline Denmark, MS, OTR/L , CBIS ascom [phone]  07/06/20, 4:03 PM

## 2020-07-06 NOTE — Consult Note (Signed)
Consultation Note Date: 07/06/2020   Patient Name: Ivan Larsen  DOB: 07/23/1955  MRN: [id number]  Age / Sex: 65 y.o., male  PCP: Venita Lick, NP Referring Physician: Algernon Huxley, MD  Reason for Consultation: Establishing goals of care and Psychosocial/spiritual support  HPI/Patient Profile: 65 y.o. male  with past medical history of CAD, PAD, DM, history of kidney stones, hyperlipidemia, MI, elevated liver enzymes admitted on 06/30/2020 with S/P aorto-bifemoral bypass surgery on 2/16, he developed hemorrhagic shock and was intubated but ultimately extubated.  Clinical Assessment and Goals of Care: I have reviewed medical records including EPIC notes, labs and imaging, received report from CCM attending and bedside nursing staff, examined the patient and met at bedside with patient and daughter/HC POA, Ivan Larsen, to discuss diagnosis prognosis, GOC, EOL wishes, disposition and options. I introduced Palliative Medicine as specialized medical care for people living with serious illness. It focuses on providing relief from the symptoms and stress of a serious illness.   Ivan Larsen is lying quietly in bed.  He appears acutely ill and very frail.  He will make an somewhat keep eye contact.  He is alert and oriented x3, able to make his needs known.  His daughter/HC POA, Ivan Larsen, is at bedside.  We discussed a brief life review of the patient.  Ivan Larsen is married, his wife, Ivan Larsen "band" has stage IV COPD, limited mobility.  She was hospitalized at [facility] in 2015 for perforated colon.  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  He was also taking care of his wife.  We discussed current illness and what it means in the larger context of on-going co-morbidities.   Natural disease trajectory and expectations at EOL were discussed.  Ivan Larsen states this is not what they were expecting when Ivan Larsen came in for his bypass graft.  We talked about the treatment plan in detail including, but not limited to PRBC infusion, ABG and results, hemodialysis, selected labs, renal ultrasound, tube feeding for a few days, time for outcomes.  Advanced directives, concepts specific to code status, artifical feeding and hydration, and rehospitalization were considered and discussed.  Ivan Larsen shares the story of Ivan Larsen's wife's illness in 50.  She shares that Ivan Larsen was hospitalized for perforated bowel, spent an extended amount of time in the hospital, and lots of time in and out of rehab over the next year.  Ivan Larsen shares her struggle with visitor restrictions.  She states that she must return to work tomorrow.  She would like for Ivan Larsen sister, Ivan Larsen, to be able to sit with him during the day.  Questions and concerns were addressed.  The family was encouraged to call with questions or concerns.   Conference with attending, bedside nursing staff, transition of care team related to patient condition, needs, goals of care.  HCPOA     HCPOA -  daughter, Ivan Larsen, is healthcare power of attorney.    SUMMARY OF RECOMMENDATIONS   At this point full scope/full code Agreeable to hemodialysis as offered Agreeable to reintubation as needed   Code Status/Advance Care Planning:  Full code  Symptom Management:   Per CCM, no additional needs at this time  Palliative Prophylaxis:   Aspiration, Frequent Pain Assessment and Oral Care  Additional Recommendations (Limitations, Scope, Preferences):  Full Scope Treatment  Psycho-social/Spiritual:   Desire for further Chaplaincy support:no  Additional Recommendations: Caregiving  Support/Resources and ICU Family Guide  Prognosis:   Unable to determine, based on outcomes.  Guarded at this  point  Discharge Planning: To be determined, based on outcomes.      Primary Diagnoses: Present on Admission: . Atherosclerosis of artery of extremity with ulceration ([place])   I have reviewed the medical record, interviewed the patient and family, and examined the patient. The following aspects are pertinent.  Past Medical History:  Diagnosis Date  . CAD (coronary artery disease)   . Diabetes mellitus without complication ([place])   . Elevated liver enzymes   . History of kidney stones   . Hyperlipidemia   . MI (myocardial infarction) ([place]) 07/01/12  . PAD (peripheral artery disease) ([facility])    Social History   Socioeconomic History  . Marital status: Married    Spouse name: Not on file  . Number of children: Not on file  . Years of education: Not on file  . Highest education level: Not on file  Occupational History  . Not on file  Tobacco Use  . Smoking status: Former Smoker    Types: Cigarettes    Quit date: 01/15/2004    Years since quitting: 16.4  . Smokeless tobacco: Never Used  Vaping Use  . Vaping Use: Never used  Substance and Sexual Activity  . Alcohol use: Yes  . Drug use: No  . Sexual activity: Not on file  Other Topics Concern  . Not on file  Social History Narrative  . Not on file   Social Determinants of Health   Financial Resource Strain: Not on file  Food Insecurity: Not on file  Transportation Needs: Not on file  Physical Activity: Not on file  Stress: Not on file  Social Connections: Not on file   Family History  Problem Relation Age of Onset  . Diabetes Mother   . Heart attack Father    Scheduled Meds: . sodium chloride   Intravenous Once  . aspirin EC  81 mg Oral Q0600  . Chlorhexidine Gluconate Cloth  6 each Topical Once  . collagenase  1 application Topical Daily  . docusate  100 mg Per Tube Daily  . feeding supplement (PROSource TF)  45 mL Per Tube TID  . furosemide  40 mg Intravenous Once  . hydrocortisone sod succinate  (SOLU-CORTEF) inj  50 mg Intravenous Q6H  . insulin aspart  0-20 Units Subcutaneous Q4H  . insulin detemir  6 Units Subcutaneous BID  . mouth rinse  15 mL Mouth Rinse Q4H  . midodrine  10 mg Per Tube TID WC   Continuous Infusions: . sodium chloride 10 mL/hr at 07/06/20 0700  . sodium chloride    . ampicillin-sulbactam (UNASYN) IV    . anticoagulant sodium citrate    . dextrose    . famotidine (PEPCID) IV Stopped (07/05/20 1128)  . fentaNYL infusion INTRAVENOUS Stopped (07/03/20 1400)  . magnesium sulfate bolus IVPB    . nitroGLYCERIN    .  norepinephrine (LEVOPHED) Adult infusion Stopped (07/04/20 1800)   PRN Meds:.sodium chloride, acetaminophen **OR** acetaminophen, alum & mag hydroxide-simeth, anticoagulant sodium citrate, guaiFENesin-dextromethorphan, hydrALAZINE, HYDROmorphone (DILAUDID) injection, labetalol, magnesium sulfate bolus IVPB, metoprolol tartrate, morphine injection, ondansetron (ZOFRAN) IV, ondansetron, oxyCODONE-acetaminophen, phenol, potassium chloride, senna-docusate, sorbitol, white petrolatum Medications Prior to Admission:  Prior to Admission medications   Medication Sig Start Date End Date Taking? Authorizing Provider  amLODipine (NORVASC) 10 MG tablet Take 1 tablet (10 mg total) by mouth daily. 02/17/20  Yes Bacigalupo, Dionne Bucy, MD  atorvastatin (LIPITOR) 40 MG tablet Take 1 tablet (40 mg total) by mouth daily. 02/17/20  Yes Bacigalupo, Dionne Bucy, MD  benazepril (LOTENSIN) 40 MG tablet Take 1 tablet (40 mg total) by mouth daily. 02/17/20  Yes Bacigalupo, Dionne Bucy, MD  collagenase (SANTYL) ointment Apply 1 application topically daily. 05/05/20  Yes Cannady, Henrine Screws T, NP  empagliflozin (JARDIANCE) 25 MG TABS tablet Take 1 tablet (25 mg total) by mouth daily before breakfast. 03/20/20  Yes Noemi Chapel A, NP  gabapentin (NEURONTIN) 300 MG capsule Take 1 capsule (300 mg total) by mouth 3 (three) times daily. 05/05/20  Yes Cannady, Jolene T, NP  hydrochlorothiazide  (HYDRODIURIL) 25 MG tablet Take 1 tablet (25 mg total) by mouth daily. 03/20/20  Yes Eulogio Bear, NP  HYDROcodone-acetaminophen (NORCO) 5-325 MG tablet Take 1 tablet by mouth every 6 (six) hours as needed for moderate pain. 06/25/20  Yes Dew, Erskine Squibb, MD  meclizine (ANTIVERT) 25 MG tablet Take 1 tablet (25 mg total) by mouth 3 (three) times daily as needed for dizziness or nausea. 08/07/19  Yes Volney American, PA-C  metoprolol tartrate (LOPRESSOR) 50 MG tablet Take 1 tablet (50 mg total) by mouth 2 (two) times daily. 02/13/20  Yes Eulogio Bear, NP  mupirocin ointment (BACTROBAN) 2 % Apply 1 application topically 2 (two) times daily. 06/02/20  Yes Cannady, Jolene T, NP  Omega-3 Fatty Acids (FISH OIL OMEGA-3) 1000 MG CAPS Take by mouth 2 (two) times daily.   Yes [provider]  ondansetron (ZOFRAN-ODT) 4 MG disintegrating tablet 1 TABLET ORALLY DISSOLVED EVERY 8 HOURS IF NEEDED FOR NAUSEA AND VOMITING Patient taking differently: Take 4 mg by mouth every 8 (eight) hours as needed for nausea or vomiting. 12/27/19  Yes Noemi Chapel A, NP  sitaGLIPtin (JANUVIA) 25 MG tablet Take 1 tablet (25 mg total) by mouth daily. 03/20/20  Yes Eulogio Bear, NP  tamsulosin (FLOMAX) 0.4 MG CAPS capsule Take 1 capsule (0.4 mg total) by mouth at bedtime. Take at nighttime to minimize dizziness/hypotension 03/20/20  Yes Noemi Chapel A, NP  clopidogrel (PLAVIX) 75 MG tablet Take 1 tablet (75 mg total) by mouth daily. Patient not taking: No sig reported 02/13/20   Eulogio Bear, NP  doxycycline (VIBRA-TABS) 100 MG tablet Take 1 tablet (100 mg total) by mouth 2 (two) times daily. Patient not taking: No sig reported 06/02/20   Marnee Guarneri T, NP  metFORMIN (GLUCOPHAGE) 500 MG tablet Take 2 tablets (1,000 mg total) by mouth 2 (two) times daily with a meal. 03/20/20   Eulogio Bear, NP   Allergies  Allergen Reactions  . Iodinated Diagnostic Agents Nausea Only    Other  reaction(s): Vomiting  . Iodine   . Shellfish Allergy Nausea And Vomiting and Other (See Comments)    Sweating  Sweating    . Shellfish-Derived Products Nausea And Vomiting and Other (See Comments)    Other reaction(s): Nausea And Vomiting, Other (See Comments)  Sweating  Sweating    Review of Systems  Unable to perform ROS: Acuity of condition    Physical Exam Vitals and nursing note reviewed.  Constitutional:      General: He is not in acute distress.    Appearance: He is ill-appearing.  HENT:     Mouth/Throat:     Mouth: Mucous membranes are moist.  Cardiovascular:     Rate and Rhythm: Normal rate.  Pulmonary:     Effort: Pulmonary effort is normal. No respiratory distress.  Abdominal:     General: There is distension.  Musculoskeletal:        General: Swelling present.  Skin:    General: Skin is warm and dry.  Neurological:     Mental Status: He is alert and oriented to person, place, and time.  Psychiatric:     Comments: Calm and cooperative     Vital Signs: BP (!) 155/69   Pulse (!) 105   Temp 97.7 F (36.5 C) (Axillary)   Resp (!) 21   Ht 5' 5"  (1.651 m)   Wt 80.4 kg   SpO2 97%   BMI 29.50 kg/m  Pain Scale: 0-10 POSS *See Group Information*: 2-Acceptable,Slightly drowsy, easily aroused Pain Score: Asleep   SpO2: SpO2: 97 % O2 Device:SpO2: 97 % O2 Flow Rate: .O2 Flow Rate (L/min): 4 L/min  IO: Intake/output summary:   Intake/Output Summary (Last 24 hours) at 07/06/2020 1031 Last data filed at 07/06/2020 0700 Gross per 24 hour  Intake 1647.61 ml  Output 920 ml  Net 727.61 ml    LBM: Last BM Date: 07/06/20 Baseline Weight: Weight: 63.5 kg Most recent weight: Weight: 80.4 kg     Palliative Assessment/Data:   Flowsheet Rows   Flowsheet Row Most Recent Value  Intake Tab   Referral Department Hospitalist  Unit at Time of Referral Intermediate Care Unit  Palliative Care Primary Diagnosis Other (Comment)  Date Notified 07/06/20  Palliative  Care Type New Palliative care  Reason for referral Clarify Goals of Care  Date of Admission 07/12/2020  Date first seen by Palliative Care 07/06/20  # of days Palliative referral response time 0 Day(s)  # of days IP prior to Palliative referral 5  Clinical Assessment   Palliative Performance Scale Score 20%  Pain Max last 24 hours Not able to report  Pain Min Last 24 hours Not able to report  Dyspnea Max Last 24 Hours Not able to report  Dyspnea Min Last 24 hours Not able to report  Psychosocial & Spiritual Assessment   Palliative Care Outcomes       Time In: 1430 Time Out: 1540 Time Total: 70 minutes Greater than 50%  of this time was spent counseling and coordinating care related to the above assessment and plan.  Signed by: Drue Novel, NP   Please contact Palliative Medicine Team phone at [phone] for questions and concerns.  For individual provider: See Shea Evans

## 2020-07-06 NOTE — Progress Notes (Signed)
Pt alert and oriented x 4. Pt was taken off bipap this AM at 0730, and has tolerated 4L [place] throughout the day. Pt achieving incentive spirometer volumes b/tw [phone] today. Pt has been hemodynamically stable. Pt has had intermittent surgical pain today from ABD incision, as well as foot wound. Throughout the day, pt has had multiple episodes of diarrhea. Around 1800, pt started having nausea/vomitting with episodes of diarrhea. Dr. Earlie Server and Dr. Wyn Quaker made aware. Dr. Earlie Server ordered KUB, and may order c. Diff sample depending on KUB results. Pt has received zofran per order with moderate relief. Daughter at bedside.

## 2020-07-06 NOTE — Progress Notes (Signed)
[facility]  ROUNDING NOTE   Subjective:   Gen- NAD Neuro-alert and interactive Pulm-now extubated, Ivan Larsen O2, + cough cv- off pressors Gi-NG tube in place Renal-UOP is picking up   Objective:  Vital signs in last 24 hours:  Temp:  [97.1 F (36.2 C)-100.5 F (38.1 C)] 97.7 F (36.5 C) (02/21 0800) Pulse Rate:  [87-116] 105 (02/21 0800) Resp:  [16-34] 21 (02/21 0800) BP: (92-155)/(45-76) 155/69 (02/21 0800) SpO2:  [88 %-98 %] 97 % (02/21 0800) FiO2 (%):  [40 %] 40 % (02/21 0300)  Weight change:  Filed Weights   06/21/2020 0801 06/30/2020 1630 07/05/20 0400  Weight: 63.5 kg 66.2 kg 80.4 kg    Intake/Output: I/O last 3 completed shifts: In: 3427.2 [P.O.:20; I.V.:2788.5; NG/GT:110; IV Piggyback:508.7] Out: 1270 [Urine:1195; Emesis/NG output:75]   Intake/Output this shift:  No intake/output data recorded.  Physical Exam: General: Critically ill  HEENT: NGT in place, mouth is dry  Lungs:  [place] O2, mild coarse breath sounds  Heart: Regular rate and rhythm  Abdomen:  +distended, dressings clean, decreased breath sounds  Extremities: 2+peripheral edema. Scrotal edema  Neurologic: Alert and able to answer Qs  Skin: No lesions  Access: Left IJ temp dialysis cath    Basic Metabolic Panel: Recent Labs  Lab 07/02/20 0346 07/03/20 0405 07/04/20 0419 07/05/20 0443 07/06/20 0434  NA 150* 149* 147* 144 144  K 3.4* 3.9 4.4 4.2 3.7  CL 105 100 101 99 100  CO2 12* 35* 32 27 28  GLUCOSE 234* 294* 112* 115* 145*  BUN 22 47* 63* 79* 73*  CREATININE 1.87* 3.17* 4.25* 5.20* 4.68*  CALCIUM 6.6* 6.0* 6.0* 6.2* 6.8*  MG 1.4* 2.3 2.1 2.3 2.2  PHOS 9.1* 5.4* 6.0* 7.8* 6.6*    Liver Function Tests: Recent Labs  Lab 07/02/20 0753 07/03/20 0405 07/04/20 0419 07/05/20 0443 07/06/20 0434  AST 849* 1,152* 491* 386* 262*  ALT 777* 581* 222* 125* 70*  ALKPHOS 46 66 87 122 131*  BILITOT 0.6 0.6 0.6 1.4* 1.6*  PROT 3.9* 3.8* 3.9* 4.5* 4.5*  ALBUMIN 2.4* 2.2* 1.9* 2.0*  2.1*   No results for input(s): LIPASE, AMYLASE in the last 168 hours. No results for input(s): AMMONIA in the last 168 hours.  CBC: Recent Labs  Lab 07/09/2020 0741 06/25/2020 1507 07/02/20 0145 07/02/20 0753 07/03/20 0405 07/03/20 0902 07/03/20 2208 07/04/20 0419 07/04/20 1053 07/05/20 0443 07/06/20 0434  WBC 13.3*   < > 27.3*  --  20.9*  --   --  25.2*  --  11.2* 10.9*  NEUTROABS 9.5*  --   --   --   --   --   --   --   --   --   --   HGB 13.7   < > 11.9*   < > 10.0*   < > 8.9* 8.6* 8.3* 7.9* 7.0*  HCT 41.1   < > 37.1*   < > 29.2*   < > 27.6* 26.6* 25.0* 24.0* 21.0*  MCV 85.6   < > 82.6  --  79.3*  --   --  82.9  --  82.8 81.7  PLT 290   < > 158  --  141*  --   --  120*  --  112* 108*   < > = values in this interval not displayed.    Cardiac Enzymes: Recent Labs  Lab 07/02/20 0346 07/02/20 0753  CKTOTAL 114 178    BNP: Invalid input(s): POCBNP  CBG: Recent Labs  Lab 07/05/20 1530 07/05/20 1939 07/05/20 2348 07/06/20 0309 07/06/20 0726  GLUCAP 92 87 78 69* 96    Microbiology: Results for orders placed or performed during the hospital encounter of 07/15/2020  MRSA PCR Screening     Status: None   Collection Time: 07-15-2020  4:32 PM   Specimen: Nasopharyngeal  Result Value Ref Range Status   MRSA by PCR NEGATIVE NEGATIVE Final    Comment:        The GeneXpert MRSA Assay (FDA approved for NASAL specimens only), is one component of a comprehensive MRSA colonization surveillance program. It is not intended to diagnose MRSA infection nor to guide or monitor treatment for MRSA infections. Performed at [facility], [address] [place], [place] [address]   CULTURE, BLOOD (ROUTINE X 2) w Reflex to ID Panel     Status: None   Collection Time: July 15, 2020  6:07 PM   Specimen: BLOOD  Result Value Ref Range Status   Specimen Description BLOOD BLOOD RIGHT HAND  Final   Special Requests   Final    BOTTLES DRAWN AEROBIC AND ANAEROBIC Blood Culture  results may not be optimal due to an inadequate volume of blood received in culture bottles   Culture   Final    NO GROWTH 5 DAYS Performed at [facility], [address] [place], [place] [address]    Report Status 07/06/2020 FINAL  Final  CULTURE, BLOOD (ROUTINE X 2) w Reflex to ID Panel     Status: None   Collection Time: 15-Jul-2020  6:07 PM   Specimen: BLOOD  Result Value Ref Range Status   Specimen Description BLOOD LEFT THUMB  Final   Special Requests   Final    BOTTLES DRAWN AEROBIC AND ANAEROBIC Blood Culture adequate volume   Culture   Final    NO GROWTH 5 DAYS Performed at [facility], [address] [place], [place] [address]    Report Status 07/06/2020 FINAL  Final  Culture, Respiratory w Gram Stain     Status: None (Preliminary result)   Collection Time: 07/03/20  2:17 PM   Specimen: Tracheal Aspirate; Respiratory  Result Value Ref Range Status   Specimen Description   Final    TRACHEAL ASPIRATE Performed at [facility], [address] [place], [place] [address]    Special Requests   Final    NONE Performed at [facility], [address] [place], [place] [address]    Gram Stain   Final    FEW WBC PRESENT, PREDOMINANTLY PMN ABUNDANT GRAM NEGATIVE RODS    Culture   Final    CULTURE REINCUBATED FOR BETTER GROWTH Performed at [facility], [address] [place], [place] [address]    Report Status PENDING  Incomplete    Coagulation Studies: No results for input(s): LABPROT, INR in the last 72 hours.  Urinalysis: No results for input(s): COLORURINE, LABSPEC, PHURINE, GLUCOSEU, HGBUR, BILIRUBINUR, KETONESUR, PROTEINUR, UROBILINOGEN, NITRITE, LEUKOCYTESUR in the last 72 hours.  Invalid input(s): APPERANCEUR    Imaging: DG Chest Port 1 View  Result Date: 07/06/2020 CLINICAL DATA:  65 year old male with a history of shortness of breath EXAM: PORTABLE CHEST 1 VIEW COMPARISON:  07/04/2020 FINDINGS:  Cardiomediastinal silhouette unchanged, with the heart borders partially obscured by overlying lung/pleural disease. Low lung volumes persist. Interval extubation. Opacities at the bilateral lung bases partially obscuring the hemidiaphragms. Blunting of the left costophrenic angle. No significant interlobular septal thickening.  Air bronchograms at the base of the right lung. Unchanged right IJ central venous catheter which terminates at the superior cavoatrial junction. Left IJ hemodialysis catheter appears to terminate superior vena cava. Gastric tube traverses the mediastinum terminating in the left upper quadrant out of the field of view. Overlying EKG leads. IMPRESSION: Interval extubation. Low lung volumes persist with bibasilar opacities, potentially combination of atelectasis/consolidation, and/or pleural fluid. Unchanged right IJ central catheter. Left IJ hemodialysis catheter, and gastric tube. Electronically Signed   By: Gilmer Mor D.O.   On: 07/06/2020 05:27     Medications:   . sodium chloride 10 mL/hr at 07/06/20 0700  . sodium chloride    . ampicillin-sulbactam (UNASYN) IV Stopped (07/05/20 2305)  . anticoagulant sodium citrate    . dextrose 5 % and 0.45% NaCl 30 mL/hr at 07/06/20 0437  . famotidine (PEPCID) IV Stopped (07/05/20 1128)  . fentaNYL infusion INTRAVENOUS Stopped (07/03/20 1400)  . magnesium sulfate bolus IVPB    . nitroGLYCERIN    . norepinephrine (LEVOPHED) Adult infusion Stopped (07/04/20 1800)   . sodium chloride   Intravenous Once  . aspirin EC  81 mg Oral Q0600  . Chlorhexidine Gluconate Cloth  6 each Topical Once  . collagenase  1 application Topical Daily  . docusate  100 mg Per Tube Daily  . feeding supplement (PROSource TF)  45 mL Per Tube TID  . furosemide  40 mg Intravenous Once  . hydrocortisone sod succinate (SOLU-CORTEF) inj  50 mg Intravenous Q6H  . insulin aspart  0-20 Units Subcutaneous Q4H  . insulin detemir  6 Units Subcutaneous BID  . mouth  rinse  15 mL Mouth Rinse Q4H  . midodrine  10 mg Per Tube TID WC   sodium chloride, acetaminophen **OR** acetaminophen, alum & mag hydroxide-simeth, anticoagulant sodium citrate, guaiFENesin-dextromethorphan, hydrALAZINE, HYDROmorphone (DILAUDID) injection, labetalol, magnesium sulfate bolus IVPB, metoprolol tartrate, morphine injection, ondansetron (ZOFRAN) IV, ondansetron, oxyCODONE-acetaminophen, phenol, potassium chloride, senna-docusate, sorbitol, white petrolatum  Assessment/ Plan:  [name] is a 65 y.o. white male with peripheral vascular disease, coronary artery disease, nephrolithiasis, hyperlipidemia, diabetes mellitus type II, who was admitted to [facility] on 05-Jul-2020 for Atherosclerosis of artery of extremity with ulceration (HCC) [I70.299, L97.909] S/P aorto-bifemoral bypass surgery [M08.676] Underwent bilateral femoral artery bypass graft by Dr. Wyn Quaker and Dr. Gilda Crease. on 07/05/20   1. Acute renal failure : with baseline creatinine of 0.9 with normal GFR on 06/25/20.  Acidosis has reversed with bicarbonate infusion.  History of glycosuria and underlying diabetic kidney disease.  AKI likely ATN due to hypotension  02/20 0701 - 02/21 0700 In: 2009.8 [P.O.:20; I.V.:1571; NG/GT:110; IV Piggyback:308.8] Out: 970 [Urine:895; Emesis/NG output:75]  Lab Results  Component Value Date   CREATININE 4.68 (H) 07/06/2020   CREATININE 5.20 (H) 07/05/2020   CREATININE 4.25 (H) 07/04/2020     UOP starting to improve Continues to have large amount of edema - Avoid hypotension - supportive care - HD today for metabolic optimization 1st HD 07/05/2020 Agree with renal artery dopplers No indication for HD today. Will monitor UOP Iv lasix x 1  2. Hypotension: with acute blood loss -  - stress dose steroids Off pressors now  3. Hypernatremia - getting 1/2 NS  \monitor  4. Generalized edema Iv lasix x 1    LOS: 5 Annikah Lovins 2/21/20229:51 AM

## 2020-07-06 NOTE — Progress Notes (Addendum)
Results for KESHAUN, DUBEY (MRN [id number]) as of 07/06/2020 13:37  Ref. Range 07/05/2020 19:39 07/05/2020 23:48 07/06/2020 03:09 07/06/2020 07:26 07/06/2020 11:14  Glucose-Capillary Latest Ref Range: 70 - 99 mg/dL 87 78 69 (L) 96 356 (H)  Noted that blood sugars have been mostly less than 100 mg/dl.  If blood sugars continue to be low, recommend changing Novolog correction scale to SENSITIVE (0-9 units) every 4 hours while NPO and then TID when able to eat. This scale starts at CBG of 121 mg/dl.   Smith Mince RN BSN CDE Diabetes Coordinator Pager: [phone]  8am-5pm

## 2020-07-06 NOTE — Evaluation (Signed)
Clinical/Bedside Swallow Evaluation Patient Details  Name: Ivan Larsen MRN: [id number] Date of Birth: 09-26-1955  Today's Date: 07/06/2020 Time: SLP Start Time (ACUTE ONLY): 1220 SLP Stop Time (ACUTE ONLY): 1320 SLP Time Calculation (min) (ACUTE ONLY): 60 min  Past Medical History:  Past Medical History:  Diagnosis Date  . CAD (coronary artery disease)   . Diabetes mellitus without complication (HCC)   . Elevated liver enzymes   . History of kidney stones   . Hyperlipidemia   . MI (myocardial infarction) (HCC) 07/01/12  . PAD (peripheral artery disease) (HCC)    Past Surgical History:  Past Surgical History:  Procedure Laterality Date  . AORTA - BILATERAL FEMORAL ARTERY BYPASS GRAFT N/A 07/18/20   Procedure: AORTA BIFEMORAL BYPASS GRAFT (VERSUS BILATERAL FEMORAL ENDARTERECTOMIES);  Surgeon: Annice Needy, MD;  Location: [facility] ORS;  Service: Vascular;  Laterality: N/A;  . CORONARY STENT PLACEMENT    . EYE SURGERY    . LOWER EXTREMITY ANGIOGRAPHY Left 06/25/2020   Procedure: LOWER EXTREMITY ANGIOGRAPHY;  Surgeon: Annice Needy, MD;  Location: [facility] INVASIVE CV LAB;  Service: Cardiovascular;  Laterality: Left;   HPI:  Per H&P "Ivan Larsen  has presented today for surgery, with the diagnosis of LT leg angio   BARD    ASO w ulceration   Covid Feb 8.  The various methods of treatment have been discussed with the patient and family. After consideration of risks, benefits and other options for treatment, the patient has consented to  Procedure(s):  LOWER EXTREMITY ANGIOGRAPHY (Left) as a surgical intervention." ST ordered to assess swallowing after extubation.   Assessment / Plan / Recommendation Clinical Impression  Bedside swallow eval revealed moderate dysphagia with inconsistent s/s of aspiration. Pt was awake and able to follow directions but responded slow and often needed repetition of commands. Pt has NGT for meds only that was placed after intubation. Pt tolerated a few bites  of applesauce. Noted oral transit delay and weak swallow. Pt fatigues after a few bites stating he was full. Wet vocal quality x1, weak voicing. Pt tolerated ice chips, one sip of nectar thick liquid, and 2 sips of water but presented with a delayed cough after last sip of water. Noted wet vocal quality as well. Rec NPO except ice chips for now. Consider adding nutrition through feeding tube for a few days. ST to follow and provide trial PO's after strength and endurance is better. Prognosis is good for toleration of a diet in a few days. SLP Visit Diagnosis: Dysphagia, oropharyngeal phase (R13.12)    Aspiration Risk  Moderate aspiration risk    Diet Recommendation Ice chips PRN after oral care   Medication Administration: Via alternative means    Other  Recommendations   ST to follow. Nutrition through NGT. Rec Dietary consult   Follow up Recommendations  ST to follow      Frequency and Duration min 3x week  2 weeks       Prognosis Prognosis for Safe Diet Advancement: Good      Swallow Study   General Date of Onset: 2020-07-18 HPI: Per H&P "Ivan Larsen  has presented today for surgery, with the diagnosis of LT leg angio   BARD    ASO w ulceration   Covid Feb 8.  The various methods of treatment have been discussed with the patient and family. After consideration of risks, benefits and other options for treatment, the patient has consented to  Procedure(s):  LOWER EXTREMITY ANGIOGRAPHY (  Left) as a surgical intervention." ST ordered to assess swallowing after extubation. Type of Study: Bedside Swallow Evaluation Diet Prior to this Study: Thin liquids;Other (Comment) (Clear liquid) Respiratory Status: Nasal cannula History of Recent Intubation: Yes Length of Intubations (days): 5 days Date extubated: 07/05/20 Behavior/Cognition: Cooperative;Pleasant mood;Lethargic/Drowsy Oral Cavity Assessment: Within Functional Limits Oral Care Completed by SLP: Yes Oral Cavity - Dentition:  Adequate natural dentition Vision: Functional for self-feeding Self-Feeding Abilities: Needs assist;Needs set up;Total assist Patient Positioning: Upright in bed Baseline Vocal Quality: Breathy;Hoarse;Low vocal intensity Volitional Cough: Weak;Wet    Oral/Motor/Sensory Function     Ice Chips Ice chips: Within functional limits Presentation: Spoon   Thin Liquid Thin Liquid: Impaired Presentation: Cup;Spoon;Straw Pharyngeal  Phase Impairments: Cough - Delayed    Nectar Thick Nectar Thick Liquid: Within functional limits Presentation: Spoon   Honey Thick Honey Thick Liquid: Not tested   Puree Puree: Within functional limits Presentation: Spoon   Solid     Solid: Not tested      Eather Colas 07/06/2020,2:51 PM

## 2020-07-06 NOTE — Progress Notes (Signed)
Bedside swallow eval today. Report to follow upon completion. Weak swallow, says hes full after a few bites. Congested cough after sip of water. Rec feed through NGT for a few days. NPO except ice chips. ST to reassess as Pt shows overall improvement.

## 2020-07-06 NOTE — Progress Notes (Addendum)
[facility] Daily Progress Note   06/23/2020:   1.  Aortobifemoral bypass with 14 mm diameter proximal 7 mm diameter distal bifurcated Dacron graft 2. Left common femoral and superficial femoral artery endarterectomies 3.   Right common femoral and superficial femoral artery endarterectomies 4.   Catheter placement to the aorta from bilateral femoral approaches and aortogram and iliofemoral arteriogram 5.   Catheter placement of the left popliteal artery from left femoral approach and left lower extremity angiogram 6.   Proximal infrarenal aorta endarterectomy 7.   Extensive bilateral renal endarterectomies  Extubated.  Responds to verbal stimulus.  Objective Vitals:   07/06/20 0800 07/06/20 0900 07/06/20 1000 07/06/20 1100  BP: (!) 155/69 (!) 143/62 (!) 142/64 (!) 142/63  Pulse: (!) 105 (!) 113 (!) 101 (!) 102  Resp: (!) 21 (!) 27 (!) 28 (!) 27  Temp: 97.7 F (36.5 C)   98 F (36.7 C)  TempSrc: Axillary   Axillary  SpO2: 97% 99% 98% 98%  Weight:      Height:        Intake/Output Summary (Last 24 hours) at 07/06/2020 1121 Last data filed at 07/06/2020 0700 Gross per 24 hour  Intake 1409.21 ml  Output 645 ml  Net 764.21 ml   Alert and oriented x1, no acute distress Extubated Cardiovascular: Regular rate and rhythm Pulmonary: Decreased bilaterally Abdomen: Soft, nontender, nondistended positive bowel sounds  Midline incision: Intact no signs of infection GU: Foley intact draining clear urine Extremity: Warm distally toes.  Foot is warm.  Good capillary refill.  Laboratory CBC    Component Value Date/Time   WBC 10.9 (H) 07/06/2020 0434   HGB 7.0 (L) 07/06/2020 0434   HGB 14.7 06/04/2020 1441   HCT 21.0 (L) 07/06/2020 0434   HCT 45.6 06/04/2020 1441   PLT 108 (L) 07/06/2020 0434   PLT 320 06/04/2020 1441   BMET    Component Value Date/Time   NA 144 07/06/2020 0434   NA 140 06/02/2020 1347   NA 141 07/03/2012 0511   K 3.7 07/06/2020 0434    K 4.0 07/03/2012 0511   CL 100 07/06/2020 0434   CL 108 (H) 07/03/2012 0511   CO2 28 07/06/2020 0434   CO2 24 07/03/2012 0511   GLUCOSE 145 (H) 07/06/2020 0434   GLUCOSE 185 (H) 07/03/2012 0511   BUN 73 (H) 07/06/2020 0434   BUN 24 06/02/2020 1347   BUN 14 07/03/2012 0511   CREATININE 4.68 (H) 07/06/2020 0434   CREATININE 0.86 07/03/2012 0511   CALCIUM 6.8 (L) 07/06/2020 0434   CALCIUM 8.1 (L) 07/03/2012 0511   GFRNONAA 13 (L) 07/06/2020 0434   GFRNONAA >60 07/03/2012 0511   GFRAA 76 06/02/2020 1347   GFRAA >60 07/03/2012 0511   Assessment/Planning: The patient is a 65 year old male with medical issues including known atherosclerotic disease status post aorto bifemoral bypass - POD#5  1) Extubated. On nasal cannula. 2) Hemoglobin 7 this AM. Will transfuse one unit packed red blood cell. Follow-up CBC. 3) encourage out of bed to chair and ambulation  Of note: Sepsis has been ruled out.  Discussed with Dr. Wallis [facility]  07/06/2020, 11:21 AM

## 2020-07-06 NOTE — Progress Notes (Signed)
PT Cancellation Note  Patient Details Name: Ivan Larsen MRN: [id number] DOB: 25-Jul-1955   Cancelled Treatment:    Reason Eval/Treat Not Completed: Other (comment) Pt noted to have low Hgb (7.0) & Hct (21.0) & nurse reports pt was just taken off bipap & placed on nasal cannula & very fatigued. Nurse requests PT attempt to see pt later in the day (if medically stable). Will f/u as able.  Aleda Grana, PT, DPT 07/06/20, 8:39 AM    Sandi Mariscal 07/06/2020, 8:38 AM

## 2020-07-06 NOTE — Progress Notes (Signed)
Pharmacy Antibiotic Note  Ivan Larsen is a 65 y.o. male with medical history including severe PVD admitted on Jul 13, 2020 with post-operative shock from OR for elective endovascular surgery for PVD. Patient is intubated, sedated, and on mechanical ventilation in the ICU. Patient is on vasopressors and stress dose steroids. Pharmacy has been consulted for Unasyn dosing for suspected aspiration pneumonia. Nephrology is following and no indication for dialysis today (1st HD 2/20), UOP improving. This is day # 4 of Unasyn   Plan: adjust Unasyn to 3 g IV every 24 hours  Continue to monitor cultures, renal function, clinical status  Height: 5\' 5"  (165.1 cm) Weight: 80.4 kg (177 lb 4 oz) IBW/kg (Calculated) : 61.5  Temp (24hrs), Avg:98.6 F (37 C), Min:97.1 F (36.2 C), Max:100.5 F (38.1 C)  Recent Labs  Lab 13-Jul-2020 2020 2020/07/13 2301 07/02/20 0145 07/02/20 0346 07/02/20 0502 07/03/20 0405 07/04/20 0419 07/05/20 0443 07/06/20 0434  WBC  --   --  27.3*  --   --  20.9* 25.2* 11.2* 10.9*  CREATININE 1.45*  --   --  1.87*  --  3.17* 4.25* 5.20* 4.68*  LATICACIDVEN >11.0* >11.0*  --   --  >11.0*  --   --   --   --     Estimated Creatinine Clearance: 15.4 mL/min (A) (by C-G formula based on SCr of 4.68 mg/dL (H)).    Allergies  Allergen Reactions  . Iodinated Diagnostic Agents Nausea Only    Other reaction(s): Vomiting  . Iodine   . Shellfish Allergy Nausea And Vomiting and Other (See Comments)    Sweating  Sweating    . Shellfish-Derived Products Nausea And Vomiting and Other (See Comments)    Other reaction(s): Nausea And Vomiting, Other (See Comments) Sweating  Sweating     Antimicrobials this admission: Cefazolin 2/16 >> 2/17 (surgical prophylaxis)  Unasyn 2/18  >>   Microbiology results: 2/18 Tracheal aspirate: Ab GNR (pending) 2/16 BCx: NG final 2/16 MRSA PCR: (-)  Thank you for allowing pharmacy to be a part of this patient's care.  3/16 07/06/2020 10:19 AM

## 2020-07-07 ENCOUNTER — Inpatient Hospital Stay: Payer: Medicare Other

## 2020-07-07 DIAGNOSIS — I70299 Other atherosclerosis of native arteries of extremities, unspecified extremity: Secondary | ICD-10-CM | POA: Diagnosis not present

## 2020-07-07 DIAGNOSIS — Z7189 Other specified counseling: Secondary | ICD-10-CM | POA: Diagnosis not present

## 2020-07-07 DIAGNOSIS — K920 Hematemesis: Secondary | ICD-10-CM

## 2020-07-07 DIAGNOSIS — J189 Pneumonia, unspecified organism: Secondary | ICD-10-CM | POA: Diagnosis not present

## 2020-07-07 DIAGNOSIS — N179 Acute kidney failure, unspecified: Secondary | ICD-10-CM

## 2020-07-07 DIAGNOSIS — J9601 Acute respiratory failure with hypoxia: Secondary | ICD-10-CM | POA: Diagnosis not present

## 2020-07-07 DIAGNOSIS — Z95828 Presence of other vascular implants and grafts: Secondary | ICD-10-CM | POA: Diagnosis not present

## 2020-07-07 DIAGNOSIS — Z515 Encounter for palliative care: Secondary | ICD-10-CM | POA: Diagnosis not present

## 2020-07-07 DIAGNOSIS — R197 Diarrhea, unspecified: Secondary | ICD-10-CM

## 2020-07-07 LAB — CBC
HCT: 27.1 % — ABNORMAL LOW (ref 39.0–52.0)
Hemoglobin: 9.4 g/dL — ABNORMAL LOW (ref 13.0–17.0)
MCH: 28 pg (ref 26.0–34.0)
MCHC: 34.7 g/dL (ref 30.0–36.0)
MCV: 80.7 fL (ref 80.0–100.0)
Platelets: 118 10*3/uL — ABNORMAL LOW (ref 150–400)
RBC: 3.36 MIL/uL — ABNORMAL LOW (ref 4.22–5.81)
RDW: 22.8 % — ABNORMAL HIGH (ref 11.5–15.5)
WBC: 22.3 10*3/uL — ABNORMAL HIGH (ref 4.0–10.5)
nRBC: 7.3 % — ABNORMAL HIGH (ref 0.0–0.2)

## 2020-07-07 LAB — GASTROINTESTINAL PANEL BY PCR, STOOL (REPLACES STOOL CULTURE)

## 2020-07-07 LAB — BASIC METABOLIC PANEL
Anion gap: 15 (ref 5–15)
BUN: 88 mg/dL — ABNORMAL HIGH (ref 8–23)
CO2: 29 mmol/L (ref 22–32)
Calcium: 6.8 mg/dL — ABNORMAL LOW (ref 8.9–10.3)
Chloride: 102 mmol/L (ref 98–111)
Creatinine, Ser: 4.83 mg/dL — ABNORMAL HIGH (ref 0.61–1.24)
GFR, Estimated: 13 mL/min — ABNORMAL LOW (ref >60)
Glucose, Bld: 157 mg/dL — ABNORMAL HIGH (ref 70–99)
Potassium: 3.1 mmol/L — ABNORMAL LOW (ref 3.5–5.1)
Sodium: 146 mmol/L — ABNORMAL HIGH (ref 135–145)

## 2020-07-07 LAB — C DIFFICILE QUICK SCREEN W PCR REFLEX
C Diff antigen: NEGATIVE
C Diff interpretation: NOT DETECTED
C Diff toxin: NEGATIVE

## 2020-07-07 LAB — URINALYSIS, COMPLETE (UACMP) WITH MICROSCOPIC
Bacteria, UA: NONE SEEN
Bilirubin Urine: NEGATIVE
Glucose, UA: 150 mg/dL — AB
Ketones, ur: NEGATIVE mg/dL
Leukocytes,Ua: NEGATIVE
Nitrite: NEGATIVE
Protein, ur: 30 mg/dL — AB
Specific Gravity, Urine: 1.012 (ref 1.005–1.030)
pH: 5 (ref 5.0–8.0)

## 2020-07-07 LAB — HEPATITIS B DNA, ULTRAQUANTITATIVE, PCR
HBV DNA SERPL PCR-ACNC: NOT DETECTED IU/mL
HBV DNA SERPL PCR-LOG IU: UNDETERMINED log10 IU/mL

## 2020-07-07 LAB — HEPATITIS B SURFACE ANTIBODY, QUANTITATIVE: Hep B S AB Quant (Post): <3.1 m[IU]/mL — ABNORMAL LOW (ref >9.9)

## 2020-07-07 LAB — GLUCOSE, CAPILLARY
Glucose-Capillary: 142 mg/dL — ABNORMAL HIGH (ref 70–99)
Glucose-Capillary: 153 mg/dL — ABNORMAL HIGH (ref 70–99)
Glucose-Capillary: 154 mg/dL — ABNORMAL HIGH (ref 70–99)
Glucose-Capillary: 157 mg/dL — ABNORMAL HIGH (ref 70–99)
Glucose-Capillary: 208 mg/dL — ABNORMAL HIGH (ref 70–99)

## 2020-07-07 LAB — PHOSPHORUS: Phosphorus: 6.8 mg/dL — ABNORMAL HIGH (ref 2.5–4.6)

## 2020-07-07 LAB — MAGNESIUM: Magnesium: 2.1 mg/dL (ref 1.7–2.4)

## 2020-07-07 LAB — LACTIC ACID, PLASMA: Lactic Acid, Venous: 1.9 mmol/L (ref 0.5–1.9)

## 2020-07-07 IMAGING — DX DG CHEST 1V PORT
1 series · 1 of 1 positions shown · non-contrast
Comparison: [DATE].

CLINICAL DATA: Shortness of breath.

EXAM:
PORTABLE CHEST 1 VIEW

[chest ap]
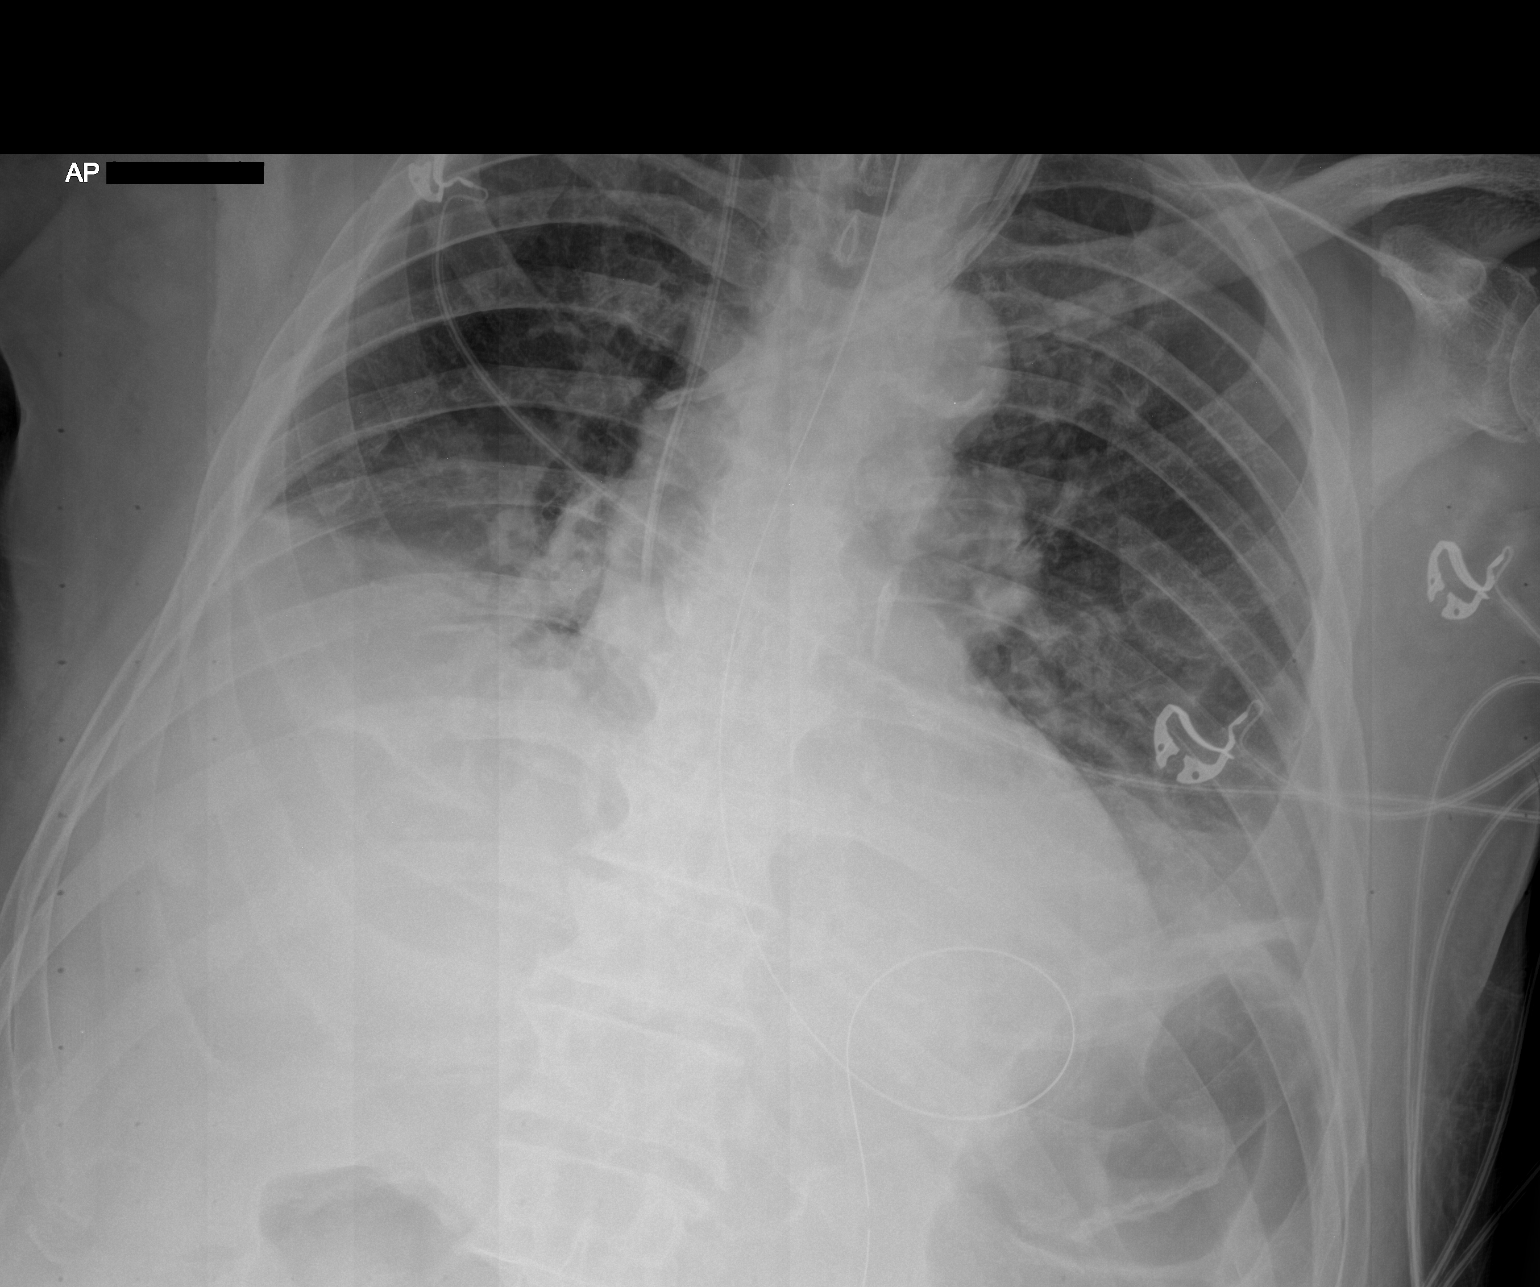

[1 of 1 positions shown; findings below may reference images not displayed]

FINDINGS: Stable cardiomediastinal silhouette. Nasogastric tube is unchanged.
Bilateral internal jugular catheters are unchanged. No pneumothorax
is noted. Mild bibasilar atelectasis or infiltrates are noted with
probable small pleural effusions. Bony thorax is unremarkable.
IMPRESSION: Mild bibasilar atelectasis or infiltrates are noted with probable
small pleural effusions.

## 2020-07-07 IMAGING — US US EXTREM LOW VENOUS
1 series · 13 of 24 positions shown · non-contrast
Comparison: None.

CLINICAL DATA: 64-year-old male with a the lower extremity edema.



[Series 1: us venous img lower bilat (dvt) · portal-venous · 13 of 34 slices shown]
[im 1/34]
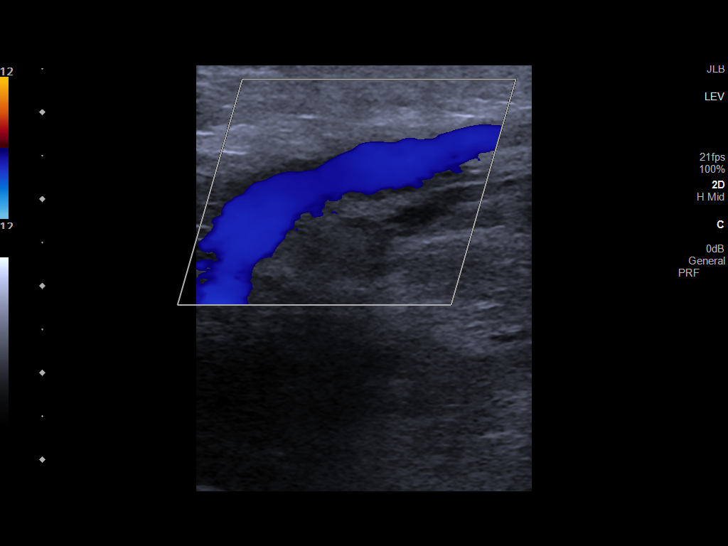
[im 3/34]
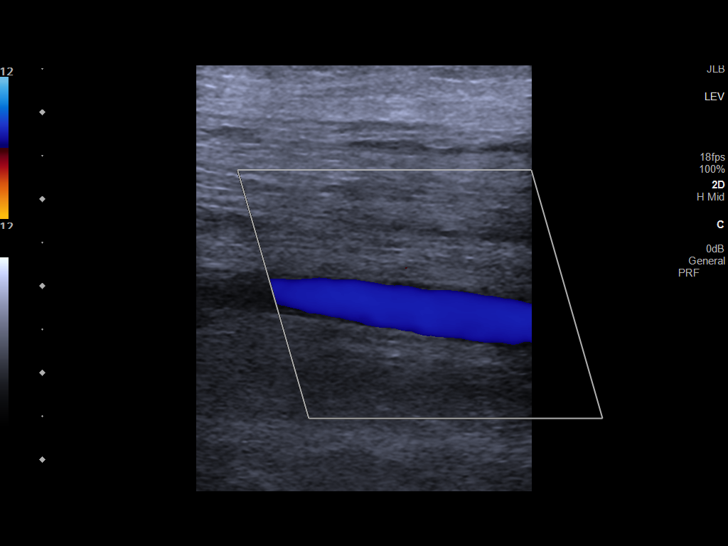
[im 6/34]
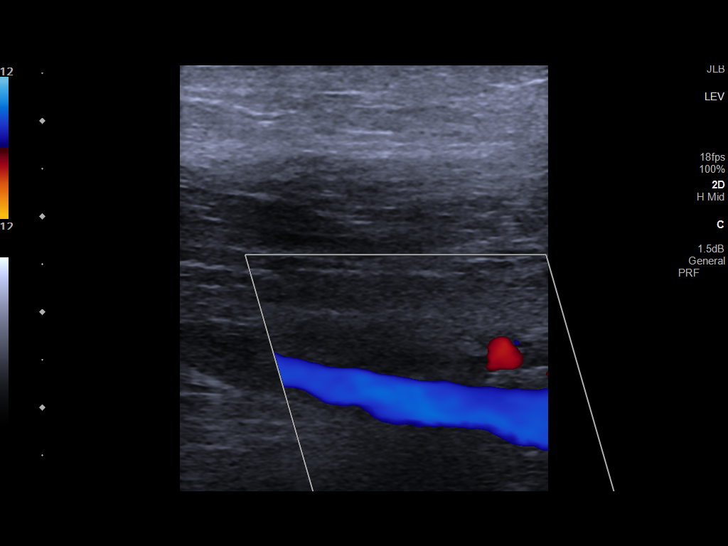
[im 9/34]
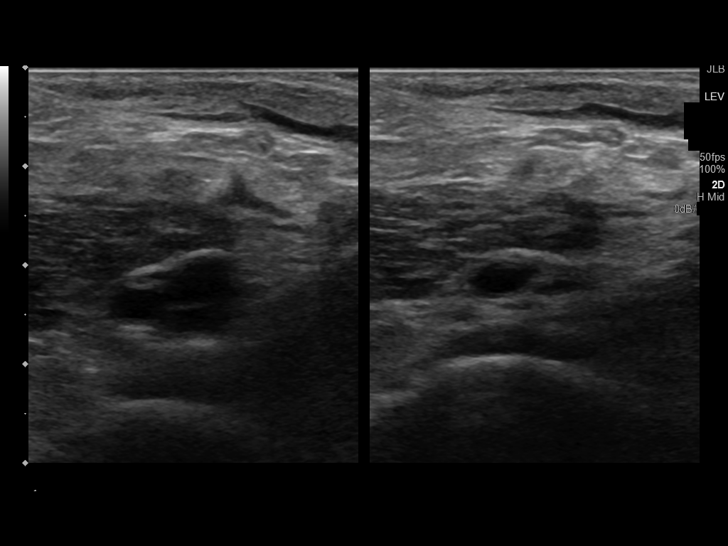
[im 12/34]
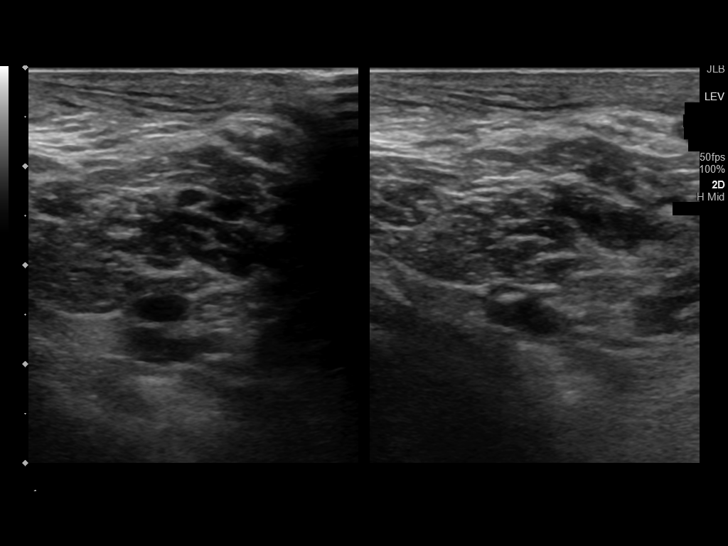
[im 15/34]
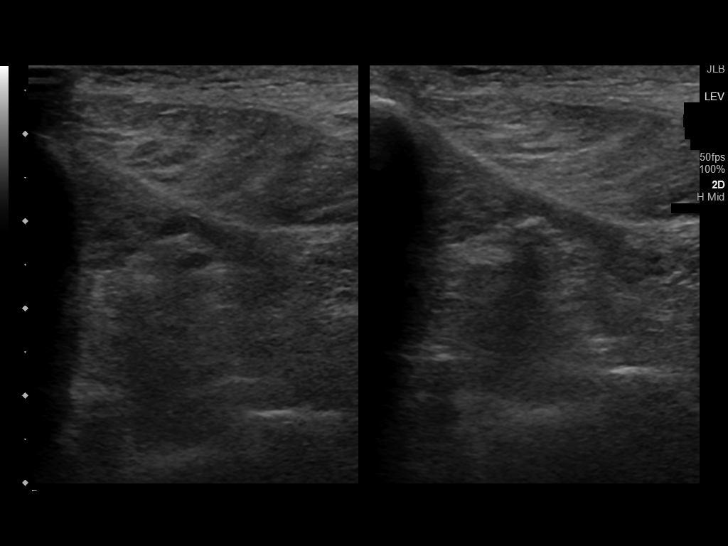
[im 18/34]
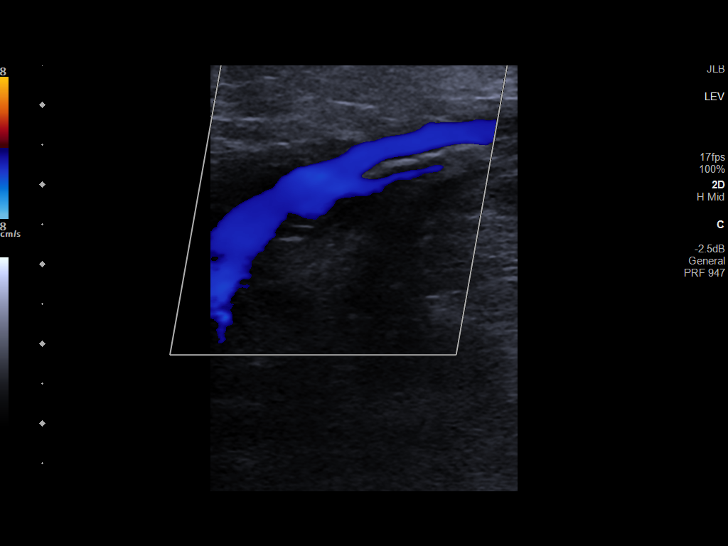
[im 19/34]
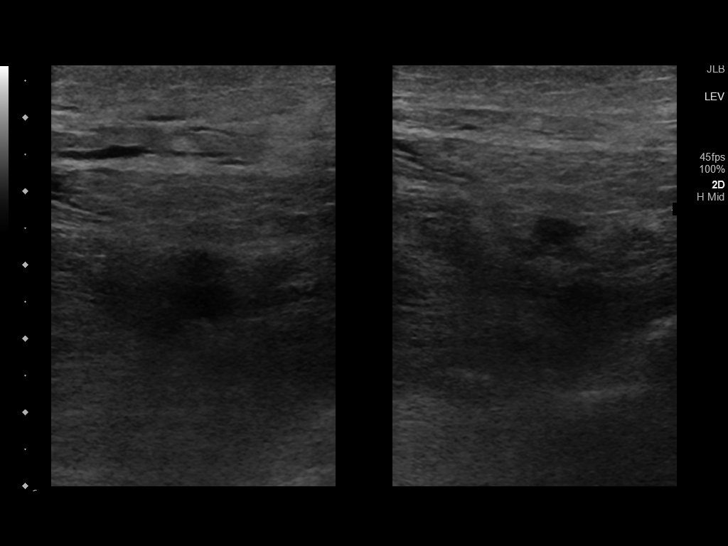
[im 22/34]
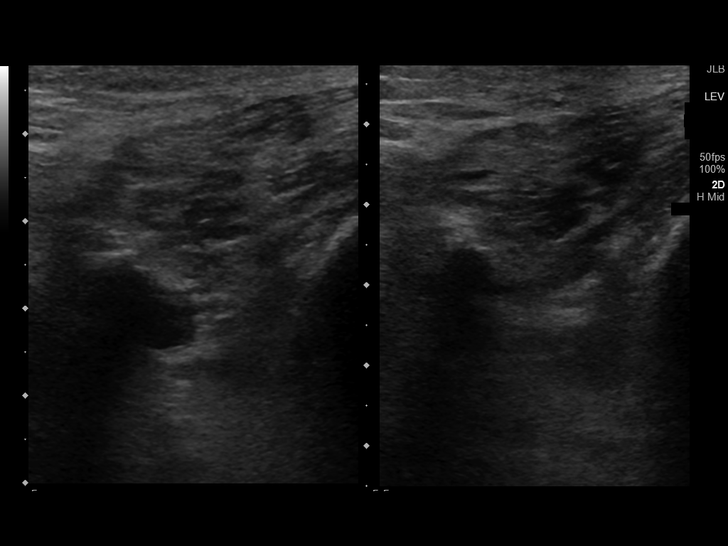
[im 25/34]
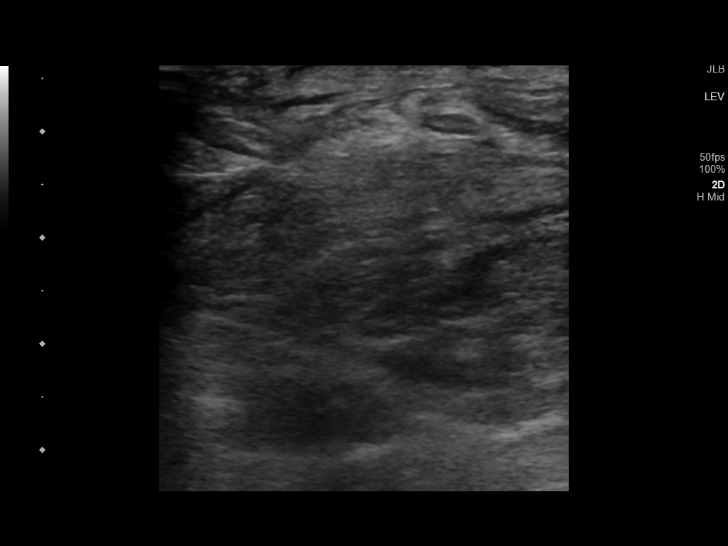
[im 28/34]
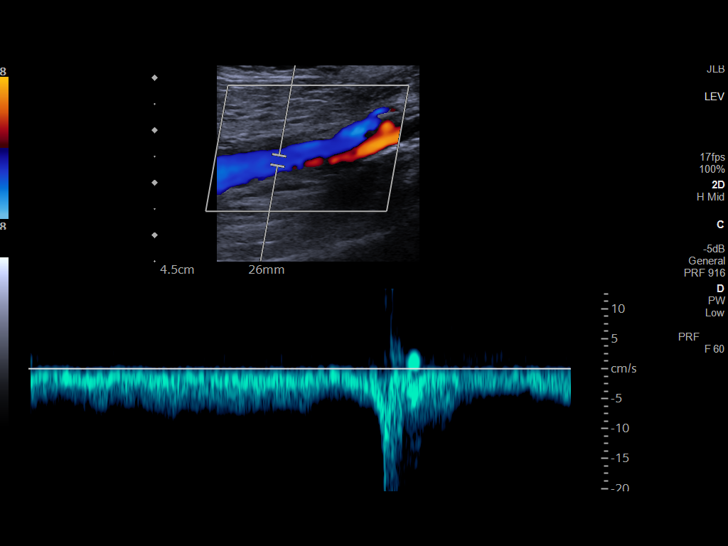
[im 31/34]
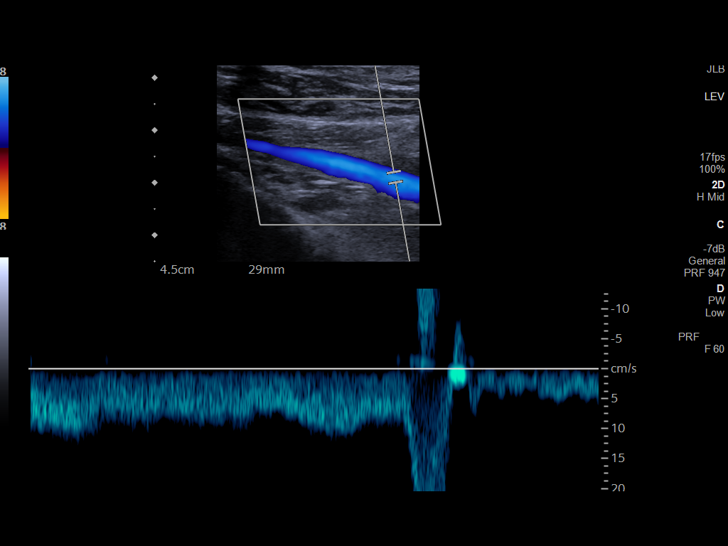
[im 34/34]
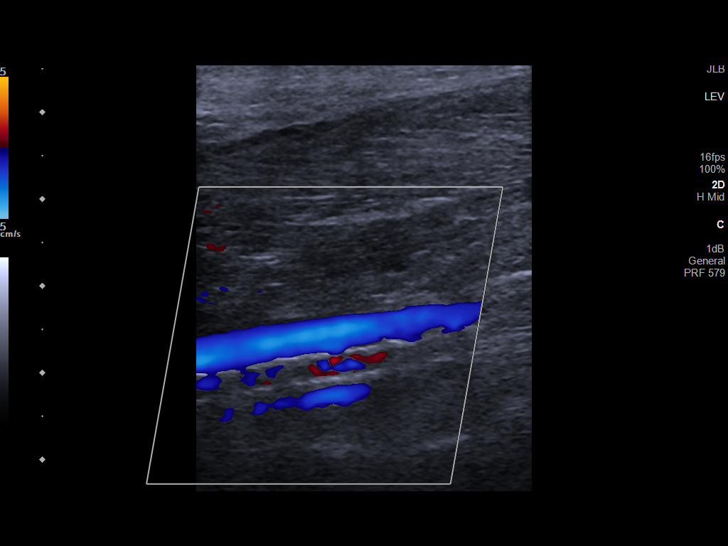

[13 of 24 positions shown; findings below may reference images not displayed]

FINDINGS: RIGHT LOWER EXTREMITY

Common Femoral Vein: Not visualized due to overlying postoperative
changes.

Saphenofemoral Junction: Not visualized due to overlying
postoperative changes.

Profunda Femoral Vein: Not visualized due to overlying postoperative
changes.

Femoral Vein: No evidence of thrombus. Normal compressibility,
respiratory phasicity and response to augmentation.

Popliteal Vein: No evidence of thrombus. Normal compressibility,
respiratory phasicity and response to augmentation.

Calf Veins: No evidence of thrombus. Normal compressibility and flow
on color Doppler imaging.

Other Findings:  None.

LEFT LOWER EXTREMITY

Common Femoral Vein: Not visualized due to overlying postoperative
changes.

Saphenofemoral Junction: Not visualized due to overlying
postoperative changes.

Profunda Femoral Vein: Not visualized due to overlying postoperative
changes.

Femoral Vein: No evidence of thrombus. Normal compressibility,
respiratory phasicity and response to augmentation.

Popliteal Vein: No evidence of thrombus. Normal compressibility,
respiratory phasicity and response to augmentation.

Calf Veins: No evidence of thrombus. Normal compressibility and flow
on color Doppler imaging.

Other Findings:  None.
IMPRESSION: No evidence of bilateral lower extremity deep vein thrombosis from
the central femoral veins to the calf veins. The bilateral common
femoral veins in the confluence are not visualized due to overlying
postsurgical changes.

## 2020-07-07 MED ORDER — POTASSIUM CHLORIDE 10 MEQ/100ML IV SOLN
10.0000 meq | INTRAVENOUS | Status: AC
  Administered 2020-07-07 (×3): 10 meq via INTRAVENOUS
  Filled 2020-07-07 (×3): qty 100

## 2020-07-07 MED ORDER — SODIUM CHLORIDE 0.9 % IV SOLN
2.0000 g | INTRAVENOUS | Status: DC
  Administered 2020-07-07 – 2020-07-08 (×2): 2 g via INTRAVENOUS
  Filled 2020-07-07 (×2): qty 2

## 2020-07-07 MED ORDER — SODIUM CHLORIDE 0.9 % IV SOLN
1.0000 g | Freq: Three times a day (TID) | INTRAVENOUS | Status: DC
  Filled 2020-07-07 (×2): qty 1

## 2020-07-07 MED ORDER — VANCOMYCIN 50 MG/ML ORAL SOLUTION: 125.0000 mg | Freq: Four times a day (QID) | ORAL | Status: DC

## 2020-07-07 MED ORDER — SODIUM CHLORIDE 0.9 % IV SOLN
80.0000 mg | Freq: Once | INTRAVENOUS | Status: AC
  Administered 2020-07-07: 80 mg via INTRAVENOUS
  Filled 2020-07-07 (×2): qty 80

## 2020-07-07 MED ORDER — PANTOPRAZOLE SODIUM 40 MG IV SOLR
40.0000 mg | Freq: Two times a day (BID) | INTRAVENOUS | Status: DC
  Administered 2020-07-07 – 2020-07-14 (×14): 40 mg via INTRAVENOUS
  Filled 2020-07-07 (×14): qty 40

## 2020-07-07 MED ORDER — INSULIN ASPART 100 UNIT/ML ~~LOC~~ SOLN
0.0000 [IU] | SUBCUTANEOUS | Status: DC
  Administered 2020-07-07 (×2): 1 [IU] via SUBCUTANEOUS
  Administered 2020-07-08: 4 [IU] via SUBCUTANEOUS
  Administered 2020-07-08: 3 [IU] via SUBCUTANEOUS
  Administered 2020-07-08: 4 [IU] via SUBCUTANEOUS
  Administered 2020-07-08 (×2): 3 [IU] via SUBCUTANEOUS
  Administered 2020-07-08: 2 [IU] via SUBCUTANEOUS
  Filled 2020-07-07 (×7): qty 1

## 2020-07-07 MED ORDER — TRACE MINERALS CU-MN-SE-ZN 300-55-60-3000 MCG/ML IV SOLN
INTRAVENOUS | Status: AC
  Filled 2020-07-07: qty 345.6

## 2020-07-07 MED ORDER — VANCOMYCIN HCL 500 MG IV SOLR
500.0000 mg | Freq: Four times a day (QID) | Status: DC
  Administered 2020-07-07 – 2020-07-08 (×3): 500 mg via RECTAL
  Filled 2020-07-07 (×5): qty 500

## 2020-07-07 MED ORDER — METRONIDAZOLE IN NACL 5-0.79 MG/ML-% IV SOLN
500.0000 mg | Freq: Three times a day (TID) | INTRAVENOUS | Status: DC
  Administered 2020-07-07 – 2020-07-08 (×4): 500 mg via INTRAVENOUS
  Filled 2020-07-07 (×6): qty 100

## 2020-07-07 NOTE — Progress Notes (Signed)
[facility] Daily Progress Note   Subjective: 06/25/2020: 1. Aortobifemoral bypass with 30mm diameter proximal 41mm diameter distal bifurcated Dacron graft 2.Left common femoral and superficial femoral artery endarterectomies 3. Right common femoral and superficial femoral artery endarterectomies 4.Catheter placement to the aorta from bilateral femoral approaches and aortogram and iliofemoral arteriogram 5.Catheter placement of the left popliteal artery from left femoral approach and left lower extremity angiogram 6.Proximal infrarenal aorta endarterectomy 7.Extensive bilateral renal endarterectomies  Able to respond to questions.   Objective: Vitals:   07/07/20 1000 07/07/20 1100 07/07/20 1200 07/07/20 1300  BP: 139/73 118/66 128/65 (!) 143/76  Pulse: (!) 121 (!) 117 (!) 123 (!) 121  Resp: (!) 33 (!) 31 (!) 30 (!) 32  Temp:   (!) 97 F (36.1 C)   TempSrc:   Oral   SpO2: 99% 100% 94% 100%  Weight:      Height:        Intake/Output Summary (Last 24 hours) at 07/07/2020 1346 Last data filed at 07/07/2020 0900 Gross per 24 hour  Intake 977.96 ml  Output 4100 ml  Net -3122.04 ml   Physical Exam: Alert and oriented x1, no acute distress Extubated Cardiovascular: Regular rate and rhythm Pulmonary: Decreased bilaterally Abdomen: Soft, nontender, nondistended positive bowel sounds             Midline incision: Intact no signs of infection  Groin incision:  GU: Foley intact draining clear urine Extremity: Warm distally toes.  Foot is warm.  Good capillary refill.  Laboratory: CBC    Component Value Date/Time   WBC 22.3 (H) 07/07/2020 0431   HGB 9.4 (L) 07/07/2020 0431   HGB 14.7 06/04/2020 1441   HCT 27.1 (L) 07/07/2020 0431   HCT 45.6 06/04/2020 1441   PLT 118 (L) 07/07/2020 0431   PLT 320 06/04/2020 1441   BMET    Component Value Date/Time   NA 146 (H) 07/07/2020 0431   NA 140 06/02/2020 1347   NA 141 07/03/2012 0511   K  3.1 (L) 07/07/2020 0431   K 4.0 07/03/2012 0511   CL 102 07/07/2020 0431   CL 108 (H) 07/03/2012 0511   CO2 29 07/07/2020 0431   CO2 24 07/03/2012 0511   GLUCOSE 157 (H) 07/07/2020 0431   GLUCOSE 185 (H) 07/03/2012 0511   BUN 88 (H) 07/07/2020 0431   BUN 24 06/02/2020 1347   BUN 14 07/03/2012 0511   CREATININE 4.83 (H) 07/07/2020 0431   CREATININE 0.86 07/03/2012 0511   CALCIUM 6.8 (L) 07/07/2020 0431   CALCIUM 8.1 (L) 07/03/2012 0511   GFRNONAA 13 (L) 07/07/2020 0431   GFRNONAA >60 07/03/2012 0511   GFRAA 76 06/02/2020 1347   GFRAA >60 07/03/2012 0511   Assessment/Planning: The patient is a 65 year old male with multiple medical issues including known atherosclerotic disease status post a right bifemoral bypass  1) Leukocytosis: Persistent now doubled. Pending blood cultures, chest x-ray, ucx / urinalysis, C. Difficile. 2) Initiation of TPN today - monitor for refeeding syndrome 3) Removed OR dressings this AM.  Blister formation from adhesive.  Recommend not using any tape. 4) Hemoglobin 9.4 this a.m. appropriate response to blood transfusion yesterday 5) Seen by nephrology - no need for dialysis today  Discussed with Dr. Wallis Mart [facility] PA-C 07/07/2020 1:46 PM

## 2020-07-07 NOTE — Progress Notes (Signed)
[facility]  ROUNDING NOTE   Subjective:   2/19- patient extubated 2/20- 1st HD 2/21-iv lasix- good response  Gen- NAD, appears better today Neuro-alert and interactive Pulm-now extubated, [place] O2, + cough cv- off pressors Gi-NG tube in place, tolerating ice chips Renal-UOP significantly improved with iv lasix   Objective:  Vital signs in last 24 hours:  Temp:  [98 F (36.7 C)-98.8 F (37.1 C)] 98.8 F (37.1 C) (02/22 0800) Pulse Rate:  [97-135] 135 (02/22 0800) Resp:  [19-30] 30 (02/22 0800) BP: (131-161)/(59-80) 146/69 (02/22 0800) SpO2:  [94 %-100 %] 96 % (02/22 0800) Weight:  [80.4 kg] 80.4 kg (02/22 0500)  Weight change:  Filed Weights   07/28/20 1630 07/05/20 0400 07/07/20 0500  Weight: 66.2 kg 80.4 kg 80.4 kg    Intake/Output: I/O last 3 completed shifts: In: 1382.9 [I.V.:878.4; NG/GT:210; IV Piggyback:294.5] Out: 4750 [Urine:4150; Emesis/NG output:600]   Intake/Output this shift:  No intake/output data recorded.  Physical Exam: General: Critically ill  HEENT: NGT in place, mouth is dry  Lungs:  [place] O2, mild coarse breath sounds  Heart: Regular rate and rhythm  Abdomen:  +distended, dressings clean, decreased breath sounds  Extremities: 2+peripheral edema over thighs. Scrotal edema  Neurologic: Alert and able to answer Qs  Skin: No lesions  Access: Left IJ temp dialysis cath  Foley in place  Basic Metabolic Panel: Recent Labs  Lab 07/03/20 0405 07/04/20 0419 07/05/20 0443 07/06/20 0434 07/07/20 0431  NA 149* 147* 144 144 146*  K 3.9 4.4 4.2 3.7 3.1*  CL 100 101 99 100 102  CO2 35* 32 27 28 29   GLUCOSE 294* 112* 115* 145* 157*  BUN 47* 63* 79* 73* 88*  CREATININE 3.17* 4.25* 5.20* 4.68* 4.83*  CALCIUM 6.0* 6.0* 6.2* 6.8* 6.8*  MG 2.3 2.1 2.3 2.2 2.1  PHOS 5.4* 6.0* 7.8* 6.6* 6.8*    Liver Function Tests: Recent Labs  Lab 07/02/20 0753 07/03/20 0405 07/04/20 0419 07/05/20 0443 07/06/20 0434  AST 849* 1,152* 491* 386*  262*  ALT 777* 581* 222* 125* 70*  ALKPHOS 46 66 87 122 131*  BILITOT 0.6 0.6 0.6 1.4* 1.6*  PROT 3.9* 3.8* 3.9* 4.5* 4.5*  ALBUMIN 2.4* 2.2* 1.9* 2.0* 2.1*   No results for input(s): LIPASE, AMYLASE in the last 168 hours. No results for input(s): AMMONIA in the last 168 hours.  CBC: Recent Labs  Lab Jul 28, 2020 0741 07/28/2020 1507 07/03/20 0405 07/03/20 0902 07/04/20 0419 07/04/20 1053 07/05/20 0443 07/06/20 0434 07/07/20 0431  WBC 13.3*   < > 20.9*  --  25.2*  --  11.2* 10.9* 22.3*  NEUTROABS 9.5*  --   --   --   --   --   --   --   --   HGB 13.7   < > 10.0*   < > 8.6* 8.3* 7.9* 7.0* 9.4*  HCT 41.1   < > 29.2*   < > 26.6* 25.0* 24.0* 21.0* 27.1*  MCV 85.6   < > 79.3*  --  82.9  --  82.8 81.7 80.7  PLT 290   < > 141*  --  120*  --  112* 108* 118*   < > = values in this interval not displayed.    Cardiac Enzymes: Recent Labs  Lab 07/02/20 0346 07/02/20 0753  CKTOTAL 114 178    BNP: Invalid input(s): POCBNP  CBG: Recent Labs  Lab 07/06/20 1541 07/06/20 1935 07/06/20 2308 07/07/20 0426 07/07/20 0716  GLUCAP  118* 133* 128* 142* 153*    Microbiology: Results for orders placed or performed during the hospital encounter of 06/29/2020  MRSA PCR Screening     Status: None   Collection Time: 07/02/2020  4:32 PM   Specimen: Nasopharyngeal  Result Value Ref Range Status   MRSA by PCR NEGATIVE NEGATIVE Final    Comment:        The GeneXpert MRSA Assay (FDA approved for NASAL specimens only), is one component of a comprehensive MRSA colonization surveillance program. It is not intended to diagnose MRSA infection nor to guide or monitor treatment for MRSA infections. Performed at [facility], [address] [place], [place] [address]   CULTURE, BLOOD (ROUTINE X 2) w Reflex to ID Panel     Status: None   Collection Time: 06/26/2020  6:07 PM   Specimen: BLOOD  Result Value Ref Range Status   Specimen Description BLOOD BLOOD RIGHT HAND  Final   Special  Requests   Final    BOTTLES DRAWN AEROBIC AND ANAEROBIC Blood Culture results may not be optimal due to an inadequate volume of blood received in culture bottles   Culture   Final    NO GROWTH 5 DAYS Performed at [facility], [address] [place], [place] [address]    Report Status 07/06/2020 FINAL  Final  CULTURE, BLOOD (ROUTINE X 2) w Reflex to ID Panel     Status: None   Collection Time: 06/19/2020  6:07 PM   Specimen: BLOOD  Result Value Ref Range Status   Specimen Description BLOOD LEFT THUMB  Final   Special Requests   Final    BOTTLES DRAWN AEROBIC AND ANAEROBIC Blood Culture adequate volume   Culture   Final    NO GROWTH 5 DAYS Performed at [facility], [address] [place], [place] [address]    Report Status 07/06/2020 FINAL  Final  Culture, Respiratory w Gram Stain     Status: None (Preliminary result)   Collection Time: 07/03/20  2:17 PM   Specimen: Tracheal Aspirate; Respiratory  Result Value Ref Range Status   Specimen Description   Final    TRACHEAL ASPIRATE Performed at [facility], [address], [place], [place] [address]    Special Requests   Final    NONE Performed at [facility], [address] [place], [place] [address]    Gram Stain   Final    FEW WBC PRESENT, PREDOMINANTLY PMN ABUNDANT GRAM NEGATIVE RODS    Culture   Final    ABUNDANT GRAM NEGATIVE RODS CULTURE REINCUBATED FOR BETTER GROWTH Performed at [facility], [address], [place], [place] [address]    Report Status PENDING  Incomplete    Coagulation Studies: No results for input(s): LABPROT, INR in the last 72 hours.  Urinalysis: No results for input(s): COLORURINE, LABSPEC, PHURINE, GLUCOSEU, HGBUR, BILIRUBINUR, KETONESUR, PROTEINUR, UROBILINOGEN, NITRITE, LEUKOCYTESUR in the last 72 hours.  Invalid input(s): APPERANCEUR    Imaging: DG Abd 1 View  Result Date: 07/06/2020 CLINICAL DATA:  Nasogastric tube placement EXAM:  ABDOMEN - 1 VIEW COMPARISON:  July 03, 2020 FINDINGS: Nasogastric tube tip and side port are in the stomach. There are loops of dilated bowel without appreciable air-fluid level. No evident free air. Lung bases clear. Skin staples to the right of midline noted. IMPRESSION: Nasogastric tube tip and side port in stomach. Suspect a degree of bowel ileus. No evident free air on semi-erect image. Electronically  Signed   By: Bretta Bang III M.D.   On: 07/06/2020 19:09   DG Chest Port 1 View  Result Date: 07/06/2020 CLINICAL DATA:  65 year old male with a history of shortness of breath EXAM: PORTABLE CHEST 1 VIEW COMPARISON:  07/04/2020 FINDINGS: Cardiomediastinal silhouette unchanged, with the heart borders partially obscured by overlying lung/pleural disease. Low lung volumes persist. Interval extubation. Opacities at the bilateral lung bases partially obscuring the hemidiaphragms. Blunting of the left costophrenic angle. No significant interlobular septal thickening. Air bronchograms at the base of the right lung. Unchanged right IJ central venous catheter which terminates at the superior cavoatrial junction. Left IJ hemodialysis catheter appears to terminate superior vena cava. Gastric tube traverses the mediastinum terminating in the left upper quadrant out of the field of view. Overlying EKG leads. IMPRESSION: Interval extubation. Low lung volumes persist with bibasilar opacities, potentially combination of atelectasis/consolidation, and/or pleural fluid. Unchanged right IJ central catheter. Left IJ hemodialysis catheter, and gastric tube. Electronically Signed   By: Gilmer Mor D.O.   On: 07/06/2020 05:27     Medications:   . sodium chloride 10 mL/hr at 07/07/20 0541  . sodium chloride    . ampicillin-sulbactam (UNASYN) IV Stopped (07/06/20 1828)  . anticoagulant sodium citrate    . dextrose 15 mL/hr at 07/07/20 0541  . famotidine (PEPCID) IV Stopped (07/06/20 1136)  . feeding supplement  (OSMOLITE 1.5 CAL) Stopped (07/06/20 1401)  . magnesium sulfate bolus IVPB    . potassium chloride 10 mEq (07/07/20 0836)   . sodium chloride   Intravenous Once  . aspirin EC  81 mg Oral Q0600  . Chlorhexidine Gluconate Cloth  6 each Topical Once  . collagenase  1 application Topical Daily  . docusate  100 mg Per Tube Daily  . feeding supplement (PROSource TF)  45 mL Per Tube BID  . hydrocortisone sod succinate (SOLU-CORTEF) inj  50 mg Intravenous Q6H  . insulin aspart  0-20 Units Subcutaneous Q4H  . insulin detemir  6 Units Subcutaneous BID  . mouth rinse  15 mL Mouth Rinse Q4H  . midodrine  10 mg Per Tube TID WC  . nutrition supplement (JUVEN)  1 packet Per Tube BID BM   sodium chloride, acetaminophen **OR** acetaminophen, alum & mag hydroxide-simeth, anticoagulant sodium citrate, guaiFENesin-dextromethorphan, hydrALAZINE, labetalol, magnesium sulfate bolus IVPB, metoprolol tartrate, morphine injection, ondansetron (ZOFRAN) IV, ondansetron, oxyCODONE-acetaminophen, phenol, potassium chloride, senna-docusate, sorbitol, white petrolatum  Assessment/ Plan:  Ivan Larsen is a 65 y.o. white male with peripheral vascular disease, coronary artery disease, nephrolithiasis, hyperlipidemia, diabetes mellitus type II, who was admitted to [facility] on 2020-07-15 for Atherosclerosis of artery of extremity with ulceration (HCC) [I70.299, L97.909] S/P aorto-bifemoral bypass surgery [D66.440] Underwent bilateral femoral artery bypass graft by Dr. Wyn Quaker and Dr. Gilda Crease. on 2020-07-15   #. Acute renal failure : with baseline creatinine of 0.9 with normal GFR on 06/25/20.  History of glycosuria and underlying diabetic kidney disease.  AKI likely ATN due to hypotension  02/21 0701 - 02/22 0700 In: 860.8 [I.V.:478.4; NG/GT:210; IV Piggyback:172.4] Out: 4250 [Urine:3650; Emesis/NG output:600]  Lab Results  Component Value Date   CREATININE 4.83 (H) 07/07/2020   CREATININE 4.68 (H) 07/06/2020    CREATININE 5.20 (H) 07/05/2020     UOP starting to improve Continues to have large amount of edema - Avoid hypotension - supportive care - HD today for metabolic optimization 1st HD 07/05/2020 Agree with renal artery dopplers No indication for HD today. Will monitor UOP Iv  lasix x 1 on 2/21 with good response No further need for HD anticipated but keep cathter for another 1-2 days  # Hypernatremia -  improved  # Generalized edema Iv lasix x 1, good response Monitor for now    LOS: 6 Ivan Larsen 2/22/20229:11 AM

## 2020-07-07 NOTE — Progress Notes (Signed)
NAME:  Ivan Larsen, MRN:  [id number], DOB:  Mar 06, 1956, LOS: 6 ADMISSION DATE:  07/11/20, INITIAL CONSULTATION DATE:  07-11-20 REFERRING MD:  Dr. Wyn Quaker, CHIEF COMPLAINT:  Hemorrhagic shock   Brief History:  65 yo male with hx of severe peripheral vascular disease, dm, dyslipidemia who underwent elective Aortobifemoral bypass on Jul 11, 2020. Intraop he lost apx 2L blood with 1/2 returned via blood saver, received IVF fluids.  After surgery patient was in circulatory shock and was emergently brought to MICU due to medical instability. He required vasopressor support perioperatively.        Past Medical History:  Peripheral artery disease Myocardial infarction Hyperlipidemia Kidney stones Elevated liver enzyme Diabetes mellitus Coronary artery disease  Significant Hospital Events:  2/16: Underwent elective aortobifemoral bypass; intraoperative blood loss with hemorrhagic shock: Transferred to ICU post procedure remained intubated 2/17: AKI, nephrology consulted, temporary dialysis catheter placed  Consults:  Vascular surgery Nephrology PCCM   Procedures:  2/16: Aortofemoral bypass 2/16: Right IJ CVC placed 2/17: Left IJ temporary dialysis catheter placed  Significant Diagnostic Tests:  2/17: Renal ultrasound>Question medical renal disease changes of both kidneys. No evidence of renal mass or hydronephrosis.Minimal ascites. 2/18: KUB>>Gas throughout mildly prominent large and small bowel loops may reflect mild ileus. NG tube in the stomach. 2/18: Echocardiogram>>LVEF 50-55% no wall motion abnl.  Micro Data:  2/14: SARS-CoV-2 PCR>> negative 2/16: MRSA PCR>> negative 2/16: Blood culture x2>> 2/18: Tracheal aspirate>>  Antimicrobials:  Cefazolin 2/16>> 2/17 (surgical prophylaxis). Unasyn 2/18>>  Interim History / Subjective:  Tolerating SBT, follows commands   Objective   Blood pressure 125/74, pulse (!) 123, temperature 98.8 F (37.1 C), temperature source  Axillary, resp. rate (!) 30, height 5\' 5"  (1.651 m), weight 80.4 kg, SpO2 97 %.        Intake/Output Summary (Last 24 hours) at 07/07/2020 0936 Last data filed at 07/07/2020 0800 Gross per 24 hour  Intake 848.71 ml  Output 4650 ml  Net -3801.29 ml   Filed Weights   11-Jul-2020 1630 07/05/20 0400 07/07/20 0500  Weight: 66.2 kg 80.4 kg 80.4 kg    Examination: General: Critically ill-appearing male, laying in bed, intubated,SAT: follows commands, no acute distress HENT: Atraumatic, normocephalic, neck supple, no JVD, ET tube in place Lungs: Clear to auscultation bilaterally, synchronous with the ventilator, even Cardiovascular: Regular rate and rhythm, S1-S2, no murmurs, rubs, gallops Abdomen: Distended, tender, bowel sounds positive x4, midline abdominal honeycomb dressing clean dry and intact Extremities: No deformities, 3+ edema bilateral lower extremities GU: scrotal edema, ecchymotic,foley in place Neuro: Sedated, withdraws from pain, follows commands off sedation, pupils PERRLA     Resolved Hospital Problem list   N/A  Assessment & Plan:  RESPIRATORY: Post-op Respiratory in the setting of Shock and Metabolic Derangements -Intubated but now extubated yet requiring cyclic NIV -Follow intermittent chest x-ray and ABG as needed  Questionable pneumonia -Monitor for  fever, Tm 100.5 07/05/20 -Curve trend WBCs 10.9--->22.3 and procalcitonin (16.3, 07/05/20) -Follow cultures as above ~tracheal aspirate NG today from  2/18 -Empiric Unasyn to Cefepime  CV: Hemorrhagic shock +/-septic shock -Continuous cardiac monitoring -Maintain MAP greater than 65 -IV fluids -Blood transfusions as indicated -Vasopressors if needed weaned off -Stress dose steroids, wean -Continue Midodrine -2D echocardiogram 50-55% LVEF  GI: Significant diarrhea yesterday Distended loops on KUB Lactic acid recheck <2, do not believe acute gut ischemia is issue Leukocytosis doubled overnight Send BioFire  Stool PCR and C Diff PCR Meanwhile change Unasyn to Cefepime and Flagyl Because dilated  colon and NGT to ILWS will start Vanc enemas pending C.diff finding  H2B to PPI for coffee ground emesis Perhaps will need TPN, nutrition will confer with primary service on this point  HEME: Acute Blood Loss Anemia -Monitor for S/Sx of bleeding -Trend CBC (H&H q6h) -SCD's for VTE Prophylaxis (can start chemical prophylaxis when cleared by surgery) -Transfuse for Hgb <8   RENAL: Lab Results  Component Value Date   CREATININE 4.83 (H) 07/07/2020   BUN 88 (H) 07/07/2020   NA 146 (H) 07/07/2020   K 3.1 (L) 07/07/2020   CL 102 07/07/2020   CO2 29 07/07/2020    Intake/Output Summary (Last 24 hours) at 07/07/2020 0936 Last data filed at 07/07/2020 0800 Gross per 24 hour  Intake 848.71 ml  Output 4650 ml  Net -3801.29 ml   Net IO Since Admission: 10,843.37 mL [07/07/20 0936]  AKI -Monitor I&O's / urinary output -Trend renal indices, improved today  -Ensure adequate renal perfusion -Avoid nephrotoxic agents as able -Replace electrolytes as indicated -Nephrology consultation appreciated -HD per consultant   Severe Peripheral Vascular Disease s/p Aortobifemoral bypass -Vascular Surgery following, will follow recommendations -Pain control   Hyperglycemia CBG's SSI Follow ICU Hypo/Hyperglycemia protocol   Best practice (evaluated daily)  Diet: NPO Pain/Anxiety/Delirium protocol (if indicated): Fentanyl/Precedex VAP protocol (if indicated): yes, implemented DVT prophylaxis: SCD's GI prophylaxis: Pepcid Glucose control: SSI, Levemir Mobility: Bedrest Disposition:ICU  Goals of Care:  Last date of multidisciplinary goals of care discussion:07/03/2020 Family and staff present: Bedside RN and pts daughter at bedside Summary of discussion: Plan for SBT today, wean vasopressors as able, continue ABX for potential PNA Follow up goals of care discussion due: 07/04/2020 Code Status: FULL  CODE  Labs   CBC: Recent Labs  Lab 07/05/2020 0741 07/13/2020 1507 07/03/20 0405 07/03/20 0902 07/04/20 0419 07/04/20 1053 07/05/20 0443 07/06/20 0434 07/07/20 0431  WBC 13.3*   < > 20.9*  --  25.2*  --  11.2* 10.9* 22.3*  NEUTROABS 9.5*  --   --   --   --   --   --   --   --   HGB 13.7   < > 10.0*   < > 8.6* 8.3* 7.9* 7.0* 9.4*  HCT 41.1   < > 29.2*   < > 26.6* 25.0* 24.0* 21.0* 27.1*  MCV 85.6   < > 79.3*  --  82.9  --  82.8 81.7 80.7  PLT 290   < > 141*  --  120*  --  112* 108* 118*   < > = values in this interval not displayed.    Basic Metabolic Panel: Recent Labs  Lab 07/03/20 0405 07/04/20 0419 07/05/20 0443 07/06/20 0434 07/07/20 0431  NA 149* 147* 144 144 146*  K 3.9 4.4 4.2 3.7 3.1*  CL 100 101 99 100 102  CO2 35* 32 27 28 29   GLUCOSE 294* 112* 115* 145* 157*  BUN 47* 63* 79* 73* 88*  CREATININE 3.17* 4.25* 5.20* 4.68* 4.83*  CALCIUM 6.0* 6.0* 6.2* 6.8* 6.8*  MG 2.3 2.1 2.3 2.2 2.1  PHOS 5.4* 6.0* 7.8* 6.6* 6.8*   GFR: Estimated Creatinine Clearance: 14.9 mL/min (A) (by C-G formula based on SCr of 4.83 mg/dL (H)). Recent Labs  Lab 07/12/2020 2301 07/02/20 0145 07/02/20 0346 07/02/20 0502 07/03/20 0405 07/04/20 0419 07/05/20 0443 07/06/20 0434 07/06/20 1938 07/07/20 0431  PROCALCITON  --   --  1.30  --  3.68 8.85 16.33  --   --   --  WBC  --    < >  --   --  20.9* 25.2* 11.2* 10.9*  --  22.3*  LATICACIDVEN >11.0*  --   --  >11.0*  --   --   --   --  2.3* 1.9   < > = values in this interval not displayed.    Liver Function Tests: Recent Labs  Lab 07/02/20 0753 07/03/20 0405 07/04/20 0419 07/05/20 0443 07/06/20 0434  AST 849* 1,152* 491* 386* 262*  ALT 777* 581* 222* 125* 70*  ALKPHOS 46 66 87 122 131*  BILITOT 0.6 0.6 0.6 1.4* 1.6*  PROT 3.9* 3.8* 3.9* 4.5* 4.5*  ALBUMIN 2.4* 2.2* 1.9* 2.0* 2.1*   No results for input(s): LIPASE, AMYLASE in the last 168 hours. No results for input(s): AMMONIA in the last 168 hours.  ABG     Component Value Date/Time   PHART 7.47 (H) 07/06/2020 1051   PCO2ART 39 07/06/2020 1051   PO2ART 68 (L) 07/06/2020 1051   HCO3 28.4 (H) 07/06/2020 1051   ACIDBASEDEF 14.7 (H) 07/02/2020 0555   O2SAT 94.5 07/06/2020 1051     Coagulation Profile: Recent Labs  Lab January 27, 2021 0741  INR 1.0    Cardiac Enzymes: Recent Labs  Lab 07/02/20 0346 07/02/20 0753  CKTOTAL 114 178    HbA1C: Hemoglobin A1C  Date/Time Value Ref Range Status  12/22/2015 12:00 AM 7.3  Final   HB A1C (BAYER DCA - WAIVED)  Date/Time Value Ref Range Status  06/02/2020 01:44 PM 6.3 <7.0 % Final    Comment:                                          Diabetic Adult            <7.0                                       Healthy Adult        4.3 - 5.7                                                           (DCCT/NGSP) American Diabetes Association's Summary of Glycemic Recommendations for Adults with Diabetes: Hemoglobin A1c <7.0%. More stringent glycemic goals (A1c <6.0%) may further reduce complications at the cost of increased risk of hypoglycemia.   02/07/2020 08:07 AM 6.4 <7.0 % Final    Comment:                                          Diabetic Adult            <7.0                                       Healthy Adult        4.3 - 5.7                                                           (  DCCT/NGSP) American Diabetes Association's Summary of Glycemic Recommendations for Adults with Diabetes: Hemoglobin A1c <7.0%. More stringent glycemic goals (A1c <6.0%) may further reduce complications at the cost of increased risk of hypoglycemia.    Hgb A1c MFr Bld  Date/Time Value Ref Range Status  07/03/2020 04:05 AM 5.7 (H) 4.8 - 5.6 % Final    Comment:    (NOTE)         Prediabetes: 5.7 - 6.4         Diabetes: >6.4         Glycemic control for adults with diabetes: <7.0   07/02/2020 03:46 AM 5.7 (H) 4.8 - 5.6 % Final    Comment:    (NOTE)         Prediabetes: 5.7 - 6.4         Diabetes: >6.4          Glycemic control for adults with diabetes: <7.0     CBG: Recent Labs  Lab 07/06/20 1541 07/06/20 1935 07/06/20 2308 07/07/20 0426 07/07/20 0716  GLUCAP 118* 133* 128* 142* 153*    Review of Systems:   Unable to assess due to intubation and sedation  Past Medical History:  He,  has a past medical history of CAD (coronary artery disease), Diabetes mellitus without complication (HCC), Elevated liver enzymes, History of kidney stones, Hyperlipidemia, MI (myocardial infarction) (HCC) (07/01/12), and PAD (peripheral artery disease) (HCC).   Surgical History:   Past Surgical History:  Procedure Laterality Date  . AORTA - BILATERAL FEMORAL ARTERY BYPASS GRAFT N/A 2020/07/27   Procedure: AORTA BIFEMORAL BYPASS GRAFT (VERSUS BILATERAL FEMORAL ENDARTERECTOMIES);  Surgeon: Annice Needy, MD;  Location: [facility] ORS;  Service: Vascular;  Laterality: N/A;  . CORONARY STENT PLACEMENT    . EYE SURGERY    . LOWER EXTREMITY ANGIOGRAPHY Left 06/25/2020   Procedure: LOWER EXTREMITY ANGIOGRAPHY;  Surgeon: Annice Needy, MD;  Location: [facility] INVASIVE CV LAB;  Service: Cardiovascular;  Laterality: Left;     Social History:   reports that he quit smoking about 16 years ago. His smoking use included cigarettes. He has never used smokeless tobacco. He reports current alcohol use. He reports that he does not use drugs.   Family History:  His family history includes Diabetes in his mother; Heart attack in his father.   Allergies Allergies  Allergen Reactions  . Iodinated Diagnostic Agents Nausea Only    Other reaction(s): Vomiting  . Iodine   . Shellfish Allergy Nausea And Vomiting and Other (See Comments)    Sweating  Sweating    . Shellfish-Derived Products Nausea And Vomiting and Other (See Comments)    Other reaction(s): Nausea And Vomiting, Other (See Comments) Sweating  Sweating      Home Medications  Prior to Admission medications   Medication Sig Start Date End Date Taking? Authorizing  Provider  amLODipine (NORVASC) 10 MG tablet Take 1 tablet (10 mg total) by mouth daily. 02/17/20  Yes Bacigalupo, Marzella Schlein, MD  atorvastatin (LIPITOR) 40 MG tablet Take 1 tablet (40 mg total) by mouth daily. 02/17/20  Yes Bacigalupo, Marzella Schlein, MD  benazepril (LOTENSIN) 40 MG tablet Take 1 tablet (40 mg total) by mouth daily. 02/17/20  Yes Bacigalupo, Marzella Schlein, MD  collagenase (SANTYL) ointment Apply 1 application topically daily. 05/05/20  Yes Cannady, Corrie Dandy T, NP  empagliflozin (JARDIANCE) 25 MG TABS tablet Take 1 tablet (25 mg total) by mouth daily before breakfast. 03/20/20  Yes Valentino Nose, NP  gabapentin (NEURONTIN) 300  MG capsule Take 1 capsule (300 mg total) by mouth 3 (three) times daily. 05/05/20  Yes Cannady, Jolene T, NP  hydrochlorothiazide (HYDRODIURIL) 25 MG tablet Take 1 tablet (25 mg total) by mouth daily. 03/20/20  Yes Valentino Nose, NP  HYDROcodone-acetaminophen (NORCO) 5-325 MG tablet Take 1 tablet by mouth every 6 (six) hours as needed for moderate pain. 06/25/20  Yes Dew, Marlow Baars, MD  meclizine (ANTIVERT) 25 MG tablet Take 1 tablet (25 mg total) by mouth 3 (three) times daily as needed for dizziness or nausea. 08/07/19  Yes Particia Nearing, PA-C  metoprolol tartrate (LOPRESSOR) 50 MG tablet Take 1 tablet (50 mg total) by mouth 2 (two) times daily. 02/13/20  Yes Valentino Nose, NP  mupirocin ointment (BACTROBAN) 2 % Apply 1 application topically 2 (two) times daily. 06/02/20  Yes Cannady, Jolene T, NP  Omega-3 Fatty Acids (FISH OIL OMEGA-3) 1000 MG CAPS Take by mouth 2 (two) times daily.   Yes [provider]  ondansetron (ZOFRAN-ODT) 4 MG disintegrating tablet 1 TABLET ORALLY DISSOLVED EVERY 8 HOURS IF NEEDED FOR NAUSEA AND VOMITING Patient taking differently: Take 4 mg by mouth every 8 (eight) hours as needed for nausea or vomiting. 12/27/19  Yes Cathlean Marseilles A, NP  sitaGLIPtin (JANUVIA) 25 MG tablet Take 1 tablet (25 mg total) by mouth daily.  03/20/20  Yes Valentino Nose, NP  tamsulosin (FLOMAX) 0.4 MG CAPS capsule Take 1 capsule (0.4 mg total) by mouth at bedtime. Take at nighttime to minimize dizziness/hypotension 03/20/20  Yes Cathlean Marseilles A, NP  clopidogrel (PLAVIX) 75 MG tablet Take 1 tablet (75 mg total) by mouth daily. Patient not taking: No sig reported 02/13/20   Valentino Nose, NP  doxycycline (VIBRA-TABS) 100 MG tablet Take 1 tablet (100 mg total) by mouth 2 (two) times daily. Patient not taking: No sig reported 06/02/20   Aura Dials T, NP  metFORMIN (GLUCOPHAGE) 500 MG tablet Take 2 tablets (1,000 mg total) by mouth 2 (two) times daily with a meal. 03/20/20   Valentino Nose, NP     Critical care time: 40 minutes    The patient is critically ill with multiple organ systems failure and requires high complexity decision making for assessment and support, frequent evaluation and titration of therapies, application of advanced monitoring technologies and extensive interpretation of multiple databases. Critical Care Time devoted to patient care services described in this note is 40 minutes.   *This note was dictated using voice recognition software/Dragon.  Despite best efforts to proofread, errors can occur which can change the meaning.  Any change was purely unintentional.

## 2020-07-07 NOTE — Progress Notes (Signed)
PT Cancellation Note  Patient Details Name: DAMARIOUS HOLTSCLAW MRN: [id number] DOB: 1956/01/04   Cancelled Treatment:    Reason Eval/Treat Not Completed: Patient at procedure or test/unavailable.  PT consult received.  Chart reviewed.  Pt currently not in room (bed gone).  Will re-attempt PT evaluation at a later date/time.  [name], PT 07/07/20, 9:23 AM

## 2020-07-07 NOTE — Progress Notes (Signed)
Palliative:  [name] is lying quietly in bed.  He appears acutely/chronically ill and very frail.  He is alert and oriented, able to make his needs known.  There is no family at bedside at this time.  [name] continues to appear weak, but is able to move his arms and legs when requested.  I encouraged him to keep moving.  Unfortunately, he has had an increase in white blood cells, and feels somewhat feverish.  His belly is soft, but feels tight in dependent areas.  He has no complaints of pain.  A UA has been collected, culture requested.  Call to daughter, [name]. She tells me that she saw kidney doctor and surgeon this morning. Archie Patten also shares that [name] went for his renal ultrasound, and that during a phone call update around lunchtime, she was told the ultrasound test could not be fully completed due to his surgical incision. Archie Patten also shares that she understands her father is being treated for C.difficile. We talked about TPN to start later today. I shared that a UA has been collected, culture pending.  We talked about expected lengthy recovery. Archie Patten states that she understands, as she experienced a protracted illness/recovery with her mother's bowel perforation in 2015.  Conference with attending and bedside nursing staff related to patient condition, needs, goals of care.  Plan: At this point continue full scope/full code.  Continue to treat the treatable.  Plan to start TPN at 1800 today for prolonged ileus.  It seems that [name] may not have further need for hemodialysis.    45 minutes  Lillia Carmel, NP Palliative medicine team Team phone [phone] Greater than 50% of this time was spent counseling and coordinating care related to the above assessment and plan.

## 2020-07-07 NOTE — TOC Initial Note (Signed)
Transition of Care [facility]) - Progression Note    Patient Details  Name: MAHAMADOU WELTZ MRN: [id number] Date of Birth: 1955/11/13  Transition of [facility]) CM/SW Contact  [name] Phone Number: [phone] 07/07/2020, 2:21 PM  Clinical Narrative:     Patient was initially at [facility] for surgery w/ dx of PAD with ulceration.  Patient was in circulatory shock after surgery and was brought to ICU.  Patient is not alert and oriented X3, and is able to communicate but is very tired and looks frail.  CSW contacted his daughter and POA [name] [phone] for collateral information.  Ms. Cathie Hoops stated the patient was "completely independent" w/ ADLs before the surgery.  Patient's PCP is [facility] in [place] and is able to keep get all of his medications without issue.  CSW spoke with Ms. Wyona Almas about PT, OT and ST assessments and the recommendation from OT for SNF placement. Ms. Cathie Hoops stated the patient is not ready for d/c to SNF at this moment, "he's not ready for SNF, you guys need to get him to better before he goes anywhere."  CSW stated there was not indication the patient will d/c this week, but that PT, OT ST are consulted once a patient begins to improve.  CSW gave Ms. [email or web address] information and informed where she could find SNF facilities and home health agencies.   Expected Discharge Plan: Skilled Nursing Facility Barriers to Discharge: SNF Pending bed offer,Continued Medical Work up  Expected Discharge Plan and Services Expected Discharge Plan: Skilled Nursing Facility In-house Referral: Clinical Social Work     Living arrangements for the past 2 months: Single Family Home                                       Social Determinants of Health (SDOH) Interventions    Readmission Risk Interventions No flowsheet data found.

## 2020-07-07 NOTE — Progress Notes (Signed)
Nutrition Follow-up  DOCUMENTATION CODES:   Not applicable  INTERVENTION:  Plan is now for initiation of TPN per pharmacy.  RD will discontinue tube feeds orders.  Monitor magnesium, potassium, and phosphorus daily for at least 3 days, MD to replete as needed, as pt is at risk for refeeding syndrome.  NUTRITION DIAGNOSIS:   Inadequate oral intake related to inability to eat as evidenced by NPO status.  Ongoing.  GOAL:   Patient will meet greater than or equal to 90% of their needs  Progressing with initiation of TPN.  MONITOR:   Vent status,Labs,Weight trends,I & O's,Skin  REASON FOR ASSESSMENT:   Ventilator    ASSESSMENT:   65 year old male with PMHx of CAD, PAD, DM, HLD, MI s/p aortobifemoral bypass, bilateral renal endarterectomies, aortic endarterectomy, and bilateral femoral endarterectomies on 2/16, intraoperatively lost approximately 2 liters of blood with 900 mL returned via blood saver and developed circulatory shock.  2/16 intubated 2/18 pt vomited; per abdominal x-ray possible mild ileus 2/19 extubated 2/21 s/p SLP eval recommended NPO except ice chips  Discussed patient on rounds. Tube feeds were unable to be initiated yesterday. Patient began having N/V with episodes of diarrhea. Abdominal x-ray was taken and per results a degree of bowel ileus is suspected. NGT was placed to LIS and per RN >1 L output. Plans to hold off on initiation of enteral nutrition. Patient has now been 6 days without nutrition. Discussed nutrition plan of care with Attending Dr. Wyn Quaker via secure chat. Plan is to initiate TPN.  Medications reviewed and include: Colace 100 mg daily, Solu-Cortef 50 mg Q6hrs IV, Novolog 0-6 units Q4hrs, Levemir 6 units BID, vancomycin, D10W at 15 mL/hr, famotidine, Flagyl, potassium chloride 10 mEq IV x 3 today.  Labs reviewed: CBG 128-153, Sodium 146, Potassium 3.1, BUN 88, Creatinine 4.83, Phosphorus 6.8.  Enteral Access: 16 Fr. NGT left nare;  terminates in stomach per abdominal x-ray 2/21  IV Access: right IJ CVC triple lumen  Weight trend: 80.4 kg on 2/20; suspect wt of 66.2 kg on 2/16 likely most accurate as it is closest to weights PTA; pt +11,185.4 mL during admission  Diet Order:   Diet Order            Diet NPO time specified Except for: Ice Chips  Diet effective now                EDUCATION NEEDS:   No education needs have been identified at this time  Skin:  Skin Assessment: Skin Integrity Issues: Skin Integrity Issues:: DTI,Unstageable,Incisions DTI: right buttocks Unstageable: left heel Incisions: closed incisions to groin and abdomen  Last BM:  07/06/2020 - large type 7  Height:   Ht Readings from Last 1 Encounters:  07/09/2020 5\' 5"  (1.651 m)   Weight:   Wt Readings from Last 1 Encounters:  07/07/20 80.4 kg   BMI:  Body mass index is 29.5 kg/m.  Estimated Nutritional Needs:   Kcal:  1800-2000  Protein:  95-110 grams  Fluid:  2 L/day  07/09/20, MS, RD, LDN Pager number available on Amion

## 2020-07-07 NOTE — Progress Notes (Signed)
Pt. transported off unit for renal ultrasound.

## 2020-07-07 NOTE — Progress Notes (Signed)
[facility] Daily Progress Note   Spoke with the patient's daughter [name] [phone]).  Repeat Covid testing was ordered today as the patient's white blood cell count has doubled in 24 hours.  Patient is afebrile.  C. Diff , venous duplex, chest x-ray, blood cultures are negative to date.  Per ICU policy, I was asked by the patient's bedside nurse Brooks Sailors to inform the patient's daughter that she would not be allowed to visit with her father until his Covid test came back.  I reached out to the ICU charge nurse in an attempt to organize a time the patient's daughter can still visit. Unforunately, the patients covid test was already administered.  When I spoke with the patient's daughter she was understandably upset.  We will continue to try our best to accommodate both the patient, his family and provide appropriate medical care.  Team members were updated This phone conversation was witnessed by [place] and [employer].  Discussed with Elisha Headland PA-C 07/07/2020 5:23 PM

## 2020-07-07 NOTE — Evaluation (Signed)
Physical Therapy Evaluation Patient Details Name: Ivan Larsen MRN: [id number] DOB: 02-27-1956 Today's Date: 07/07/2020   History of Present Illness  Pt is a 65 y.o. male with non-healing ulceration of L foot s/p 2/16 aortobifemoral bypass, bilateral renal endarterectomies, aortic endarterectomy, and bilateral femoral endarterectomies.  Post op pt in circulatory shock and went to MICU (intubated) d/t medical instability; required vasopressor support perioperatively; started CRRT (L temporary IJ HD cath placed 2/17).  Extubated 2/19.  PMH includes CAD, DM, HLD, MI, PAD, eye sx, coronary stent placement.  Clinical Impression  Prior to hospital admission, pt was independent with functional mobility; lives with his wife.  Pt A&O x4 but pt still appearing with general confusion during session.  Deferred OOB mobility per discussion with pt's nurse d/t pt's current medical status but pt cleared for ex's in bed.  Pt initially appearing agreeable to ex's but did not actively finish 10 reps of any exercise.  Attempted to have pt participate in more ex's but pt stated "I could", "but I don't want to"; session ended d/t pt not wanting to perform any more ex's.  HR 123-129 bpm during sessions activities.  Pt appearing with generalized weakness and decreased activity tolerance.  Pt would benefit from skilled PT to address noted impairments and functional limitations (see below for any additional details).  Upon hospital discharge, anticipate pt would benefit from SNF (therapy will continue to assess mobility and update recommendations as deemed appropriate).    Follow Up Recommendations SNF    Equipment Recommendations   (TBD at next facility)    Recommendations for Other Services OT consult     Precautions / Restrictions Precautions Precautions: Fall Precaution Comments: B groin incisions; NPO; aspiration (HOB >30 degrees); NG tube; L temporary IJ HD catheter restrictions Restrictions Weight Bearing  Restrictions: No      Mobility  Bed Mobility               General bed mobility comments: Deferred per discussion with pt's nurse    Transfers                    Ambulation/Gait                Stairs            Wheelchair Mobility    Modified Rankin (Stroke Patients Only)       Balance                                             Pertinent Vitals/Pain Pain Assessment: No/denies pain Pain Intervention(s): Limited activity within patient's tolerance;Monitored during session;Repositioned  O2 sats WFL on room air during sessions activities.    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    Prior Function Level of Independence: Independent               Hand Dominance   Dominant Hand: Right    Extremity/Trunk Assessment   Upper Extremity  Assessment Upper Extremity Assessment: Generalized weakness    Lower Extremity Assessment Lower Extremity Assessment: Generalized weakness (at least 3/5 AROM DF/PF; fair B quad set strength; at least 2+/5 B hip flexion)    Cervical / Trunk Assessment Cervical / Trunk Assessment: Normal  Communication   Communication: No difficulties  Cognition Arousal/Alertness: Awake/alert Behavior During Therapy: Flat affect                                   General Comments: Oriented to person, place, time, and situation.  Pt perseverating on needing to have bowel movement (pt educated many times  regarding having flexi-seal in place--nurse notified).  Pt appearing confused in general (discussed with pt's nurse).      General Comments General comments (skin integrity, edema, etc.): no drainage noted B groin incisions.  Nursing cleared pt for participation in physical therapy.    Exercises Total Joint Exercises Ankle Circles/Pumps: AROM;Strengthening;Both;Supine (7 reps) Quad Sets: AROM;Strengthening;Both;Supine (4 reps) Heel Slides: AAROM;PROM;Strengthening;Both;10 reps;Supine Hip ABduction/ADduction: AAROM;PROM;Strengthening;Both;10 reps;Supine (limited ROM per pt comfort)   Assessment/Plan    PT Assessment Patient needs continued PT services  PT Problem List Decreased strength;Decreased activity tolerance;Decreased balance;Decreased mobility;Decreased cognition;Decreased knowledge of use of DME;Decreased safety awareness;Decreased knowledge of precautions;Cardiopulmonary status limiting activity;Pain;Decreased skin integrity       PT Treatment Interventions DME instruction;Gait training;Stair training;Functional mobility training;Therapeutic activities;Therapeutic exercise;Balance training;Patient/family education    PT Goals (Current goals can be found in the Care Plan section)  Acute Rehab PT Goals Patient Stated Goal: to improve mobility PT Goal Formulation: With patient Time For Goal Achievement: 07/21/20 Potential to Achieve Goals: Good    Frequency Min 2X/week   Barriers to discharge Decreased caregiver support      Co-evaluation               AM-PAC PT "6 Clicks" Mobility  Outcome Measure Help needed turning from your back to your side while in a flat bed without using bedrails?: A Lot Help needed moving from lying on your back to sitting on the side of a flat bed without using bedrails?: Total Help needed moving to and from a bed to a chair (including a wheelchair)?: Total Help needed standing up from a chair using your arms (e.g., wheelchair or  bedside chair)?: Total Help needed to walk in hospital room?: Total Help needed climbing 3-5 steps with a railing? : Total 6 Click Score: 7    End of Session   Activity Tolerance: Patient tolerated treatment well Patient left: in bed;with call bell/phone within reach;with bed alarm set;with SCD's reapplied;Other (comment) (B prevalon boots in place) Nurse Communication: Precautions;Other (comment) (general overview of PT session) PT Visit Diagnosis: Other abnormalities of gait and mobility (R26.89);Muscle weakness (generalized) (M62.81);Difficulty in walking, not elsewhere classified (R26.2);Pain    Time: 1340-1355 PT Time Calculation (min) (ACUTE ONLY): 15 min   Charges:   PT Evaluation $PT Eval Low Complexity: 1 Low PT Treatments $Therapeutic Exercise: 8-22 mins       [name], PT 07/07/20, 2:30 PM

## 2020-07-07 NOTE — Progress Notes (Signed)
PHARMACY - TOTAL PARENTERAL NUTRITION CONSULT NOTE   Indication: Prolonged ileus  Patient Measurements: Height: 5\' 5"  (165.1 cm) Weight: 80.4 kg (177 lb 4 oz) IBW/kg (Calculated) : 61.5 TPN AdjBW (KG): 63.5 Body mass index is 29.5 kg/m.  Assessment: 65 year old male with PMHx of CAD, PAD, DM, HLD, MI s/p aortobifemoral bypass, bilateral renal endarterectomies, aortic endarterectomy, and bilateral femoral endarterectomies. Tube feeds were unable to be initiated yesterday. Patient has now been 6 days without nutrition.   Glucose / Insulin: 96 - 157  9 units SSI required levemir 12 units BID Electrolytes: hypokalemia,  Renal: iHD Hepatic:  Intake / Output; MIVF:  GI Imaging: 2/21: Suspect a degree of bowel ileus. No evident free air on semi-erect image GI Surgeries / Procedures: no recent  Central access: 2020/07/23 TPN start date: 07/07/20  Nutritional Goals (per RD recommendation on 07/07/20): kCal: 1800 - 2000/day, Protein: 95 - 110 grams/day, Fluid: 2L/day  Current Nutrition:  NPO  Plan:   Start TPN at 40 mL/hr at 1800  Nutritional Components  Amino Acids (Clinisol 15%) 54 g/L: 51.8 grams total  Dextrose: 22%: 211 grams total  KCal: 925.4 total/day   Electrolytes in TPN: Na 50mEq/L, K 20mEq/L, Ca 22mEq/L, Mg 74mEq/L, and Phos 0 mmol/L. Cl:Ac 1:1  Add standard MVI and trace elements to TPN  Continue ds SSI q4h SSI and adjust as needed   Continue levemir 6 units BID  10 mEq IV KCl x 3  Monitor TPN labs on Mon/Thurs, daily until stable  4m 07/07/2020,11:03 AM

## 2020-07-07 NOTE — Progress Notes (Signed)
PHARMACY NOTE:  ANTIMICROBIAL RENAL DOSAGE ADJUSTMENT  Current antimicrobial regimen includes a mismatch between antimicrobial dosage and estimated renal function.  As per policy approved by the Pharmacy & Therapeutics and Medical Executive Committees, the antimicrobial dosage will be adjusted accordingly.  Current antimicrobial dosage:  Cefepime 1 gram IV every 8 hours  Indication: HCAP  Renal Function:  Estimated Creatinine Clearance: 14.9 mL/min (A) (by C-G formula based on SCr of 4.83 mg/dL (H)). []      On intermittent HD, scheduled: []      On CRRT    Antimicrobial dosage has been changed to:  Cefepime 2 grams IV every 24 hours   Thank you for allowing pharmacy to be a part of this patient's care.  , [facility] 07/07/2020 10:01 AM

## 2020-07-08 ENCOUNTER — Inpatient Hospital Stay: Payer: Medicare Other

## 2020-07-08 ENCOUNTER — Inpatient Hospital Stay
Admission: RE | Admit: 2020-07-08 | Discharge: 2020-07-08 | Disposition: A | Discharge status: Home / Self Care | Payer: Medicare Other | Source: Home / Self Care | Attending: Pulmonary Disease | Admitting: Pulmonary Disease

## 2020-07-08 DIAGNOSIS — Z515 Encounter for palliative care: Secondary | ICD-10-CM | POA: Diagnosis not present

## 2020-07-08 DIAGNOSIS — J9601 Acute respiratory failure with hypoxia: Secondary | ICD-10-CM | POA: Diagnosis not present

## 2020-07-08 DIAGNOSIS — I469 Cardiac arrest, cause unspecified: Secondary | ICD-10-CM | POA: Diagnosis not present

## 2020-07-08 DIAGNOSIS — Z7189 Other specified counseling: Secondary | ICD-10-CM | POA: Diagnosis not present

## 2020-07-08 DIAGNOSIS — D62 Acute posthemorrhagic anemia: Secondary | ICD-10-CM | POA: Diagnosis not present

## 2020-07-08 DIAGNOSIS — E870 Hyperosmolality and hypernatremia: Secondary | ICD-10-CM | POA: Diagnosis not present

## 2020-07-08 LAB — BLOOD GAS, ARTERIAL
Acid-Base Excess: 11 mmol/L — ABNORMAL HIGH (ref 0.0–2.0)
Bicarbonate: 34 mmol/L — ABNORMAL HIGH (ref 20.0–28.0)
FIO2: 0.7
MECHVT: 500 mL
O2 Saturation: 94.2 %
PEEP: 5 cmH2O
Patient temperature: 37
RATE: 20 resp/min
pCO2 arterial: 38 mmHg (ref 32.0–48.0)
pH, Arterial: 7.56 — ABNORMAL HIGH (ref 7.350–7.450)
pO2, Arterial: 61 mmHg — ABNORMAL LOW (ref 83.0–108.0)

## 2020-07-08 LAB — COMPREHENSIVE METABOLIC PANEL
ALT: 45 U/L — ABNORMAL HIGH (ref 0–44)
AST: 182 U/L — ABNORMAL HIGH (ref 15–41)
Albumin: 1.7 g/dL — ABNORMAL LOW (ref 3.5–5.0)
Alkaline Phosphatase: 171 U/L — ABNORMAL HIGH (ref 38–126)
Anion gap: 20 — ABNORMAL HIGH (ref 5–15)
BUN: 96 mg/dL — ABNORMAL HIGH (ref 8–23)
CO2: 25 mmol/L (ref 22–32)
Calcium: 6.6 mg/dL — ABNORMAL LOW (ref 8.9–10.3)
Chloride: 106 mmol/L (ref 98–111)
Creatinine, Ser: 4.56 mg/dL — ABNORMAL HIGH (ref 0.61–1.24)
GFR, Estimated: 14 mL/min — ABNORMAL LOW (ref >60)
Glucose, Bld: 332 mg/dL — ABNORMAL HIGH (ref 70–99)
Potassium: 3.2 mmol/L — ABNORMAL LOW (ref 3.5–5.1)
Sodium: 151 mmol/L — ABNORMAL HIGH (ref 135–145)
Total Bilirubin: 1.9 mg/dL — ABNORMAL HIGH (ref 0.3–1.2)
Total Protein: 4.6 g/dL — ABNORMAL LOW (ref 6.5–8.1)

## 2020-07-08 LAB — CULTURE, RESPIRATORY W GRAM STAIN

## 2020-07-08 LAB — CBC WITH DIFFERENTIAL/PLATELET
Abs Immature Granulocytes: 1 10*3/uL — ABNORMAL HIGH (ref 0.00–0.07)
Band Neutrophils: 4 %
Basophils Absolute: 0 10*3/uL (ref 0.0–0.1)
Basophils Relative: 0 %
Eosinophils Absolute: 0 10*3/uL (ref 0.0–0.5)
Eosinophils Relative: 0 %
HCT: 24.3 % — ABNORMAL LOW (ref 39.0–52.0)
Hemoglobin: 7.9 g/dL — ABNORMAL LOW (ref 13.0–17.0)
Lymphocytes Relative: 15 %
Lymphs Abs: 3.1 10*3/uL (ref 0.7–4.0)
MCH: 27.7 pg (ref 26.0–34.0)
MCHC: 32.5 g/dL (ref 30.0–36.0)
MCV: 85.3 fL (ref 80.0–100.0)
Metamyelocytes Relative: 2 %
Monocytes Absolute: 0.8 10*3/uL (ref 0.1–1.0)
Monocytes Relative: 4 %
Myelocytes: 3 %
Neutro Abs: 15.6 10*3/uL — ABNORMAL HIGH (ref 1.7–7.7)
Neutrophils Relative %: 72 %
Platelets: 127 10*3/uL — ABNORMAL LOW (ref 150–400)
RBC: 2.85 MIL/uL — ABNORMAL LOW (ref 4.22–5.81)
Smear Review: NORMAL
WBC: 20.5 10*3/uL — ABNORMAL HIGH (ref 4.0–10.5)
nRBC: 15 /100 WBC — ABNORMAL HIGH
nRBC: 8.5 % — ABNORMAL HIGH (ref 0.0–0.2)

## 2020-07-08 LAB — URINE CULTURE: Culture: NO GROWTH

## 2020-07-08 LAB — LACTIC ACID, PLASMA: Lactic Acid, Venous: 3.5 mmol/L (ref 0.5–1.9)

## 2020-07-08 LAB — GLUCOSE, CAPILLARY
Glucose-Capillary: 128 mg/dL — ABNORMAL HIGH (ref 70–99)
Glucose-Capillary: 259 mg/dL — ABNORMAL HIGH (ref 70–99)
Glucose-Capillary: 265 mg/dL — ABNORMAL HIGH (ref 70–99)
Glucose-Capillary: 290 mg/dL — ABNORMAL HIGH (ref 70–99)
Glucose-Capillary: 306 mg/dL — ABNORMAL HIGH (ref 70–99)
Glucose-Capillary: 308 mg/dL — ABNORMAL HIGH (ref 70–99)
Glucose-Capillary: 410 mg/dL — ABNORMAL HIGH (ref 70–99)

## 2020-07-08 LAB — SARS CORONAVIRUS 2 (TAT 6-24 HRS): SARS Coronavirus 2: NEGATIVE

## 2020-07-08 LAB — ECHOCARDIOGRAM LIMITED
Height: 65 in
S' Lateral: 2.45 cm
Weight: 2726.65 oz

## 2020-07-08 LAB — APTT: aPTT: 42 seconds — ABNORMAL HIGH (ref 24–36)

## 2020-07-08 LAB — PROTIME-INR
INR: 1.5 — ABNORMAL HIGH (ref 0.8–1.2)
Prothrombin Time: 17.7 seconds — ABNORMAL HIGH (ref 11.4–15.2)

## 2020-07-08 LAB — MAGNESIUM: Magnesium: 2.6 mg/dL — ABNORMAL HIGH (ref 1.7–2.4)

## 2020-07-08 LAB — PATHOLOGIST SMEAR REVIEW

## 2020-07-08 LAB — BRAIN NATRIURETIC PEPTIDE: B Natriuretic Peptide: 382.8 pg/mL — ABNORMAL HIGH (ref 0.0–100.0)

## 2020-07-08 LAB — BETA-HYDROXYBUTYRIC ACID: Beta-Hydroxybutyric Acid: 0.2 mmol/L (ref 0.05–0.27)

## 2020-07-08 LAB — PROCALCITONIN: Procalcitonin: 6.08 ng/mL

## 2020-07-08 LAB — PHOSPHORUS: Phosphorus: 8.3 mg/dL — ABNORMAL HIGH (ref 2.5–4.6)

## 2020-07-08 IMAGING — CT CT ABD-PELV W/O CM
2 of 4 series · 15 of 46 positions shown, 17 images · non-contrast
Comparison: [DATE], [DATE]

CLINICAL DATA: Pelvic free fluid on FAST scan performed earlier
today during code

EXAM:
CT ABDOMEN AND PELVIS WITHOUT CONTRAST
TECHNIQUE: Multidetector CT imaging of the abdomen and pelvis was performed
following the standard protocol without IV contrast. Unenhanced CT
was performed per clinician order. Lack of IV contrast limits
sensitivity and specificity, especially for evaluation of
abdominal/pelvic solid viscera.

[Series 2: routine abd/pel wo · axial · 0.87mm/px · z∈[-917,-422]mm · 12 of 109 slices shown, 14 images]
[im 5/109  soft-tissue]
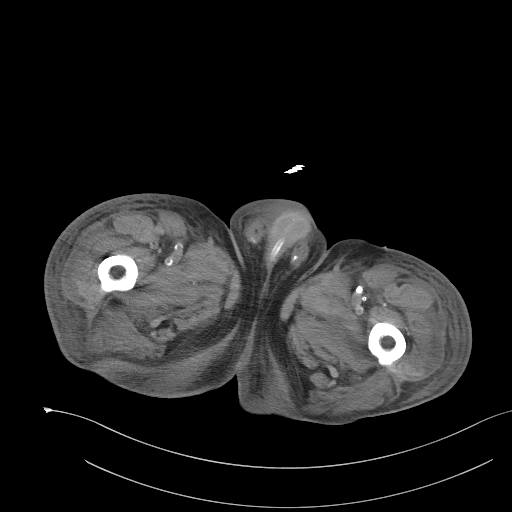
[im 5/109  bone]
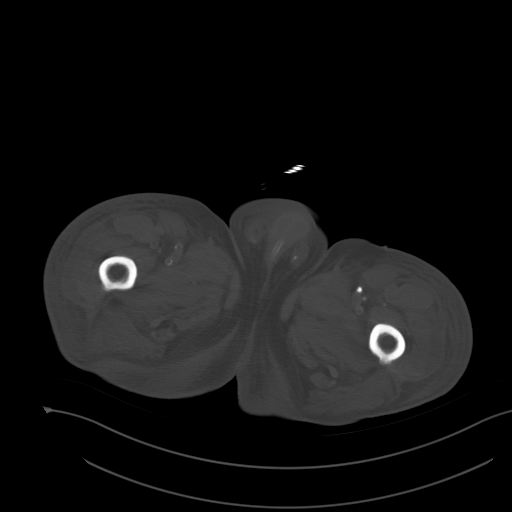
[im 15/109  soft-tissue]
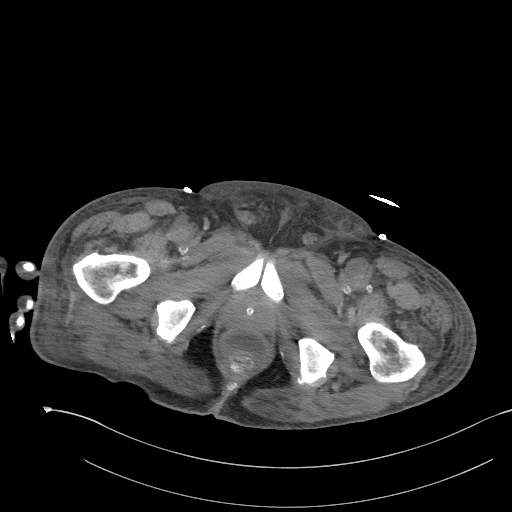
[im 24/109  soft-tissue]
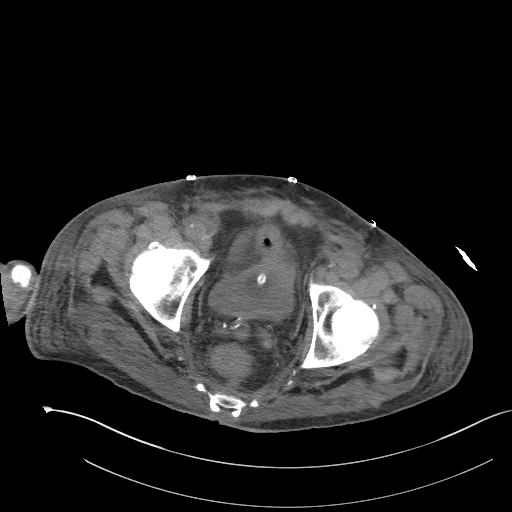
[im 33/109  soft-tissue]
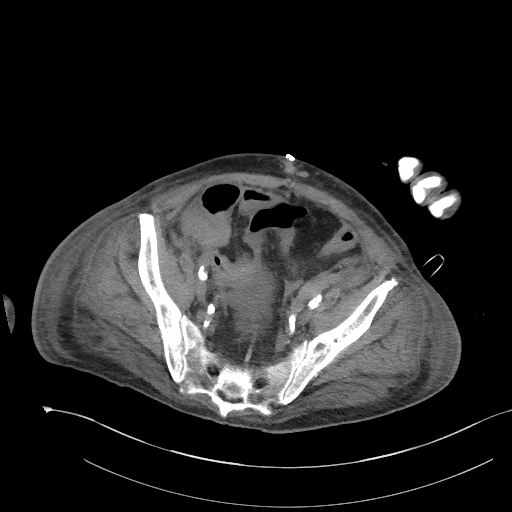
[im 43/109  soft-tissue]
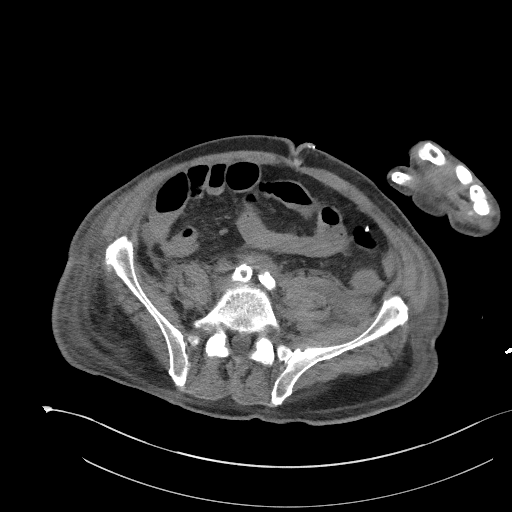
[im 52/109  soft-tissue]
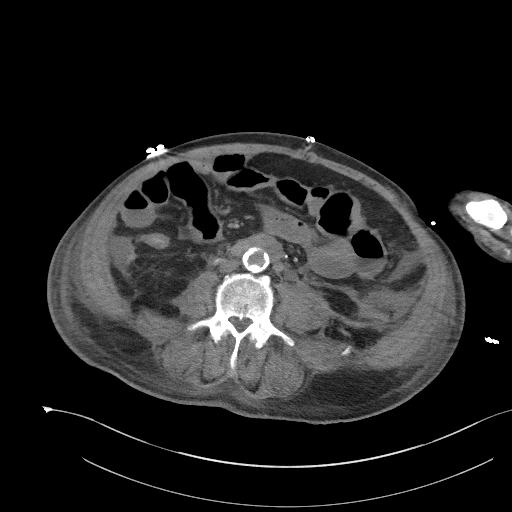
[im 57/109  soft-tissue]
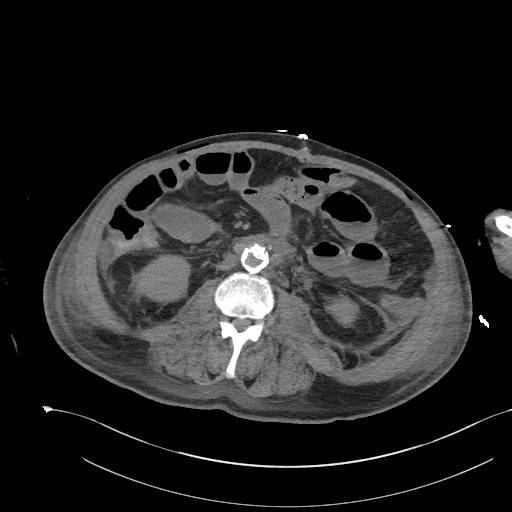
[im 66/109  soft-tissue]
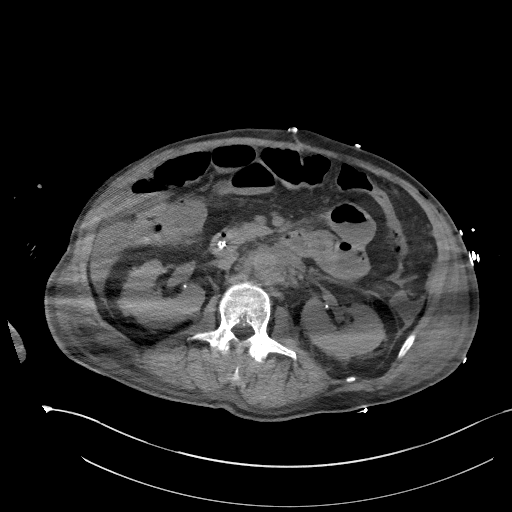
[im 76/109  soft-tissue]
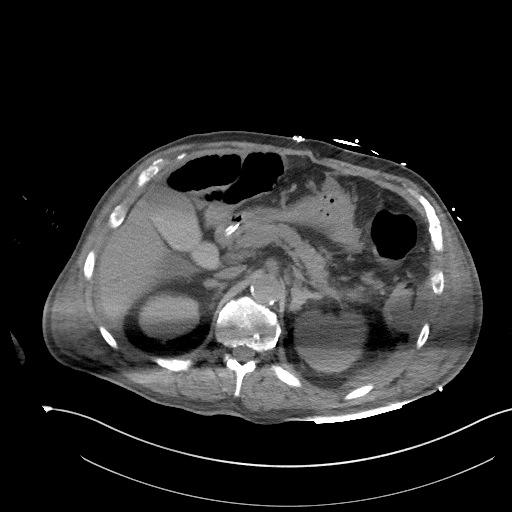
[im 76/109  bone]
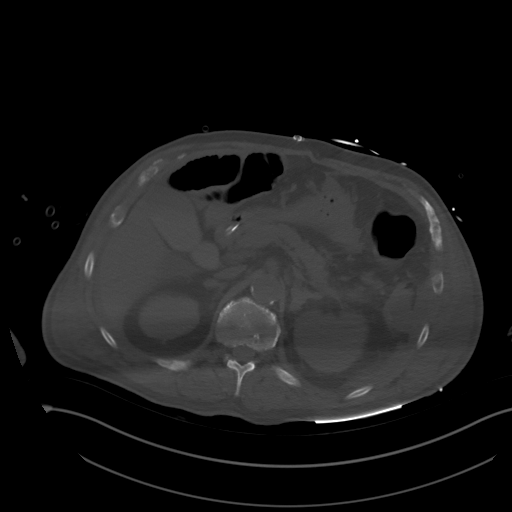
[im 85/109  soft-tissue]
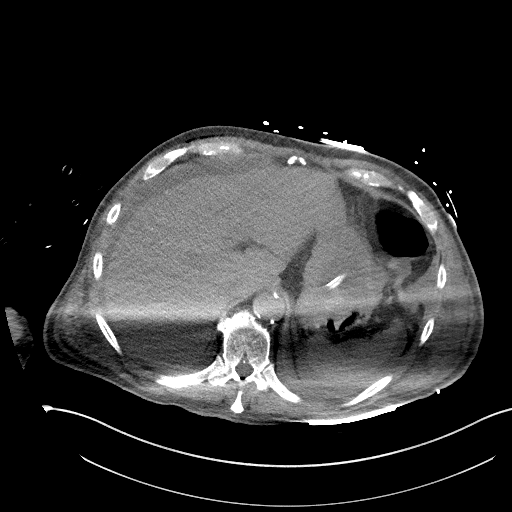
[im 94/109  soft-tissue]
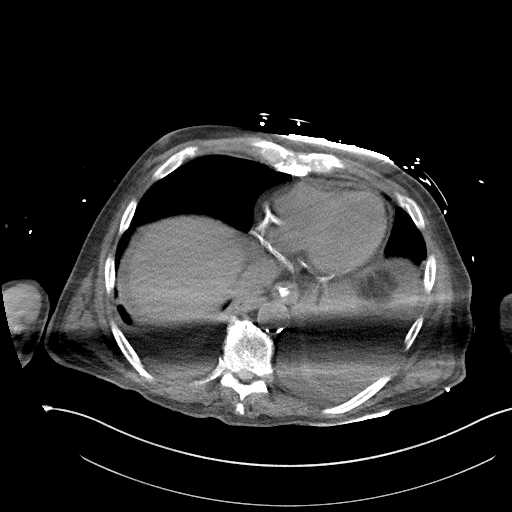
[im 104/109  soft-tissue]
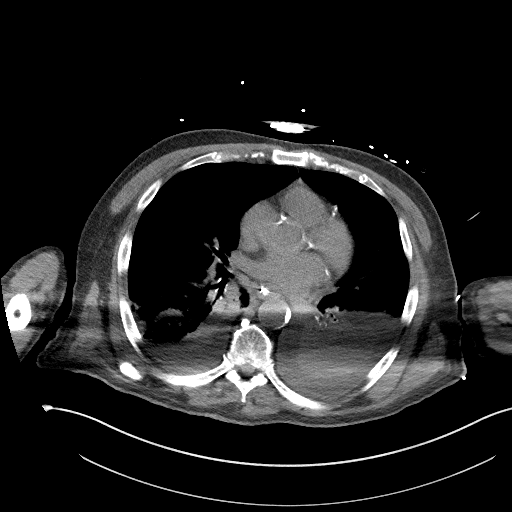

[Series 5: coronal st · coronal · 0.96mm/px · 3 of 90 slices shown]
[im 30/90  soft-tissue]
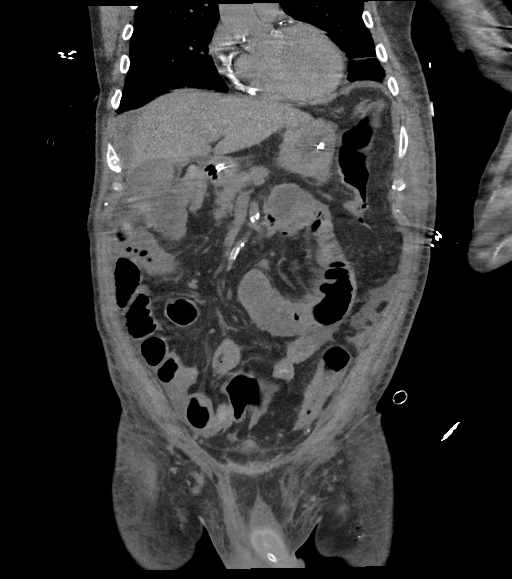
[im 40/90  soft-tissue]
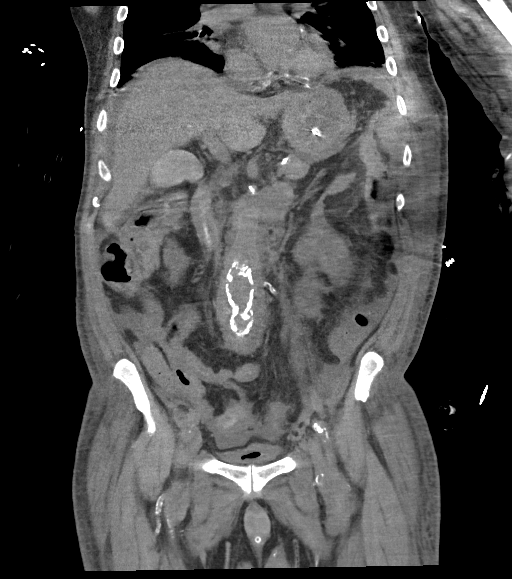
[im 50/90  soft-tissue]
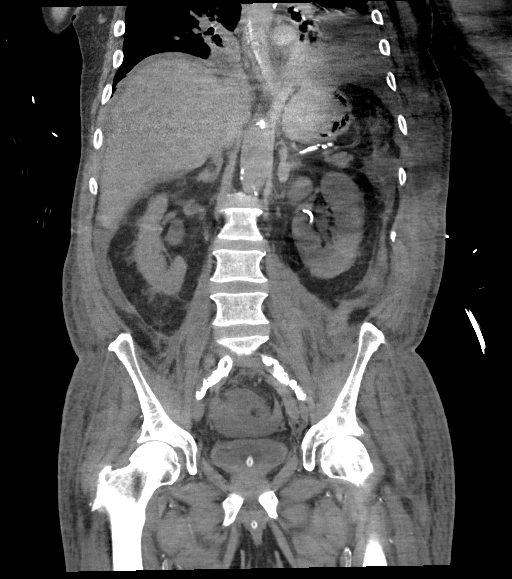

[15 of 46 positions shown; findings below may reference images not displayed]

FINDINGS: Lower chest: There are bilateral pleural effusions, left greater
than right, volume estimated less than 1 L on the left. Bilateral
lower lobe consolidation, with dense left lower lobe consolidation
likely representing atelectasis. Diffuse airspace disease throughout
the right lower lobe could reflect infection or aspiration. No
pneumothorax.

Unenhanced CT was performed per clinician order. Lack of IV contrast
limits sensitivity and specificity, especially for evaluation of
abdominal/pelvic solid viscera.

Hepatobiliary: High attenuation material within the gallbladder may
reflect sludge. No evidence of acute cholecystitis. Unenhanced
imaging of the liver is unremarkable.

Pancreas: Unremarkable. No pancreatic ductal dilatation or
surrounding inflammatory changes.

Spleen: Normal in size without focal abnormality.

Adrenals/Urinary Tract: No urinary tract calculi or obstructive
uropathy. Stable hilar vascular calcifications. Bladder is
decompressed with a Foley catheter. The adrenals are unremarkable.

Stomach/Bowel: No evidence of high-grade bowel obstruction. Mildly
distended loops of jejunum within the left mid abdomen, with
scattered gas fluid levels, likely reflect postoperative ileus.
Normal retrocecal appendix. Enteric catheter extends through the
stomach, tip in the proximal duodenum.

Vascular/Lymphatic: Evaluation of the vessels is limited without
intravenous contrast. Postsurgical changes are seen from interval
aortobifemoral bypass procedure. Extensive atherosclerosis within
the native aorta and its branches. Retroperitoneal fluid and fat
stranding likely reflects postoperative change given recent surgical
procedure 1 week ago.

No pathologic adenopathy within the abdomen or pelvis.

Reproductive: Prostate is unremarkable.

Other: Trace free fluid within the upper abdomen and bilateral
flanks. There is minimal higher attenuation fluid within the left
lower quadrant which could reflect blood products, compatible with
recent surgery. No free gas. Postsurgical changes from midline
laparotomy. There is mild diffuse subcutaneous edema.

Musculoskeletal: No acute or destructive bony lesions. Reconstructed
images demonstrate no additional findings.
IMPRESSION: 1. Limited evaluation of the postoperative abdomen without
intravenous or oral contrast.
2. Postsurgical changes from recent aortobifemoral bypass procedure,
with small amount of free intraperitoneal fluid and retroperitoneal
fluid as above. Fluid in the left lower quadrant is mildly increased
in attenuation, consistent with blood products. This is most likely
related to recent surgical intervention. Evaluation of the vascular
lumen and bypass grafts cannot be assessed without IV contrast.
3. Bilateral pleural effusions with bibasilar consolidation, left
greater than right. Findings are consistent with a combination of
airspace disease and atelectasis.
4. Mild distension of the proximal jejunum with scattered gas fluid
levels, most consistent with ileus. No evidence of high-grade
obstruction.
5.  Aortic Atherosclerosis ([KD]-[KD]).

## 2020-07-08 IMAGING — DX DG ABDOMEN 1V
1 series · 1 of 1 positions shown · non-contrast
Comparison: Chest x-ray [DATE].

CLINICAL DATA: Intubation.  OG tube placement.

EXAM:
ABDOMEN - 1 VIEW

[abdomen supine]
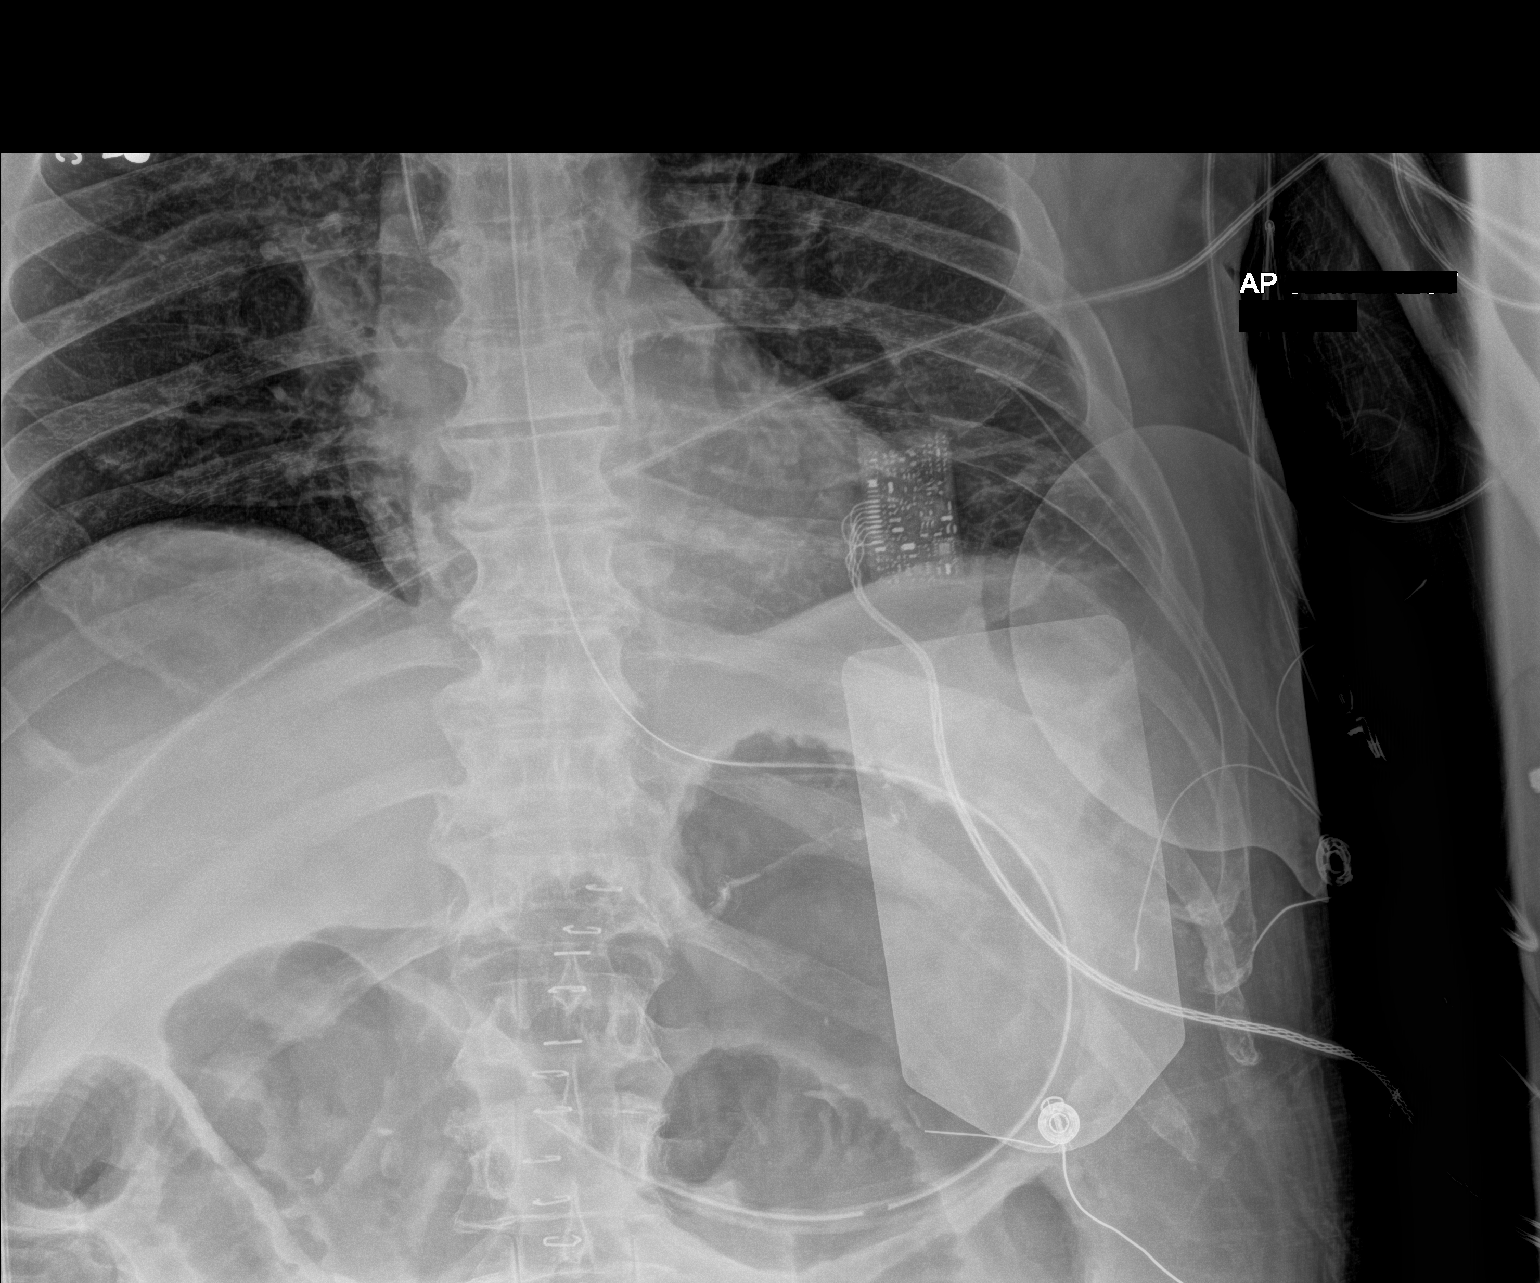

[1 of 1 positions shown; findings below may reference images not displayed]

FINDINGS: Endotracheal tube not visualized. Central line noted with tip over
SVC. OG tube noted with tip over stomach. Prominent bowel distention
noted. Abdominal series should be considered for further evaluation.
No free air identified. Degenerative change thoracolumbar spine.
Surgical staples noted over the abdomen.
IMPRESSION: 1. Endotracheal tube not visualized. Central line noted with tip
over SVC. OG tube noted with tip over the stomach.
2. Prominent bowel distention noted. Abdominal series should be
considered for further evaluation. No free air identified.

## 2020-07-08 IMAGING — US US ABDOMEN LIMITED RUQ/ASCITES
1 series · 14 of 25 positions shown · non-contrast
Comparison: CT abdomen and pelvis [DATE].

CLINICAL DATA: Elevated liver function tests.

EXAM:
ULTRASOUND ABDOMEN LIMITED RIGHT UPPER QUADRANT

[Series 1: us abdomen limited ruq (liver/gb) · 14 of 52 slices shown]
[im 1/52]
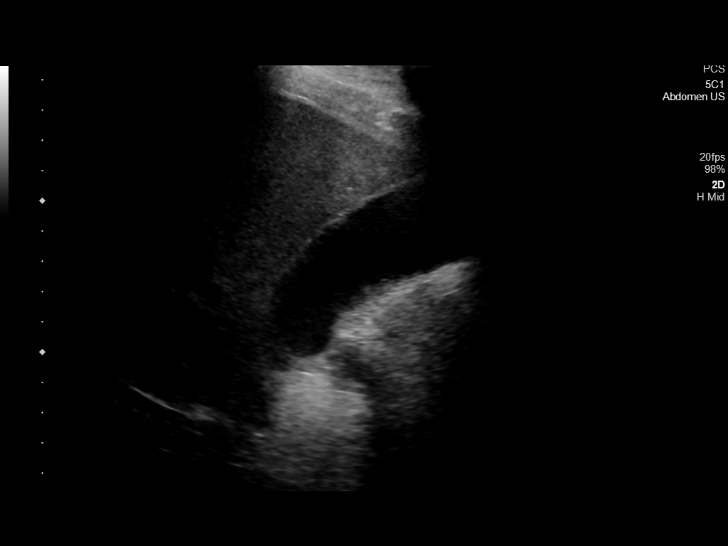
[im 5/52]
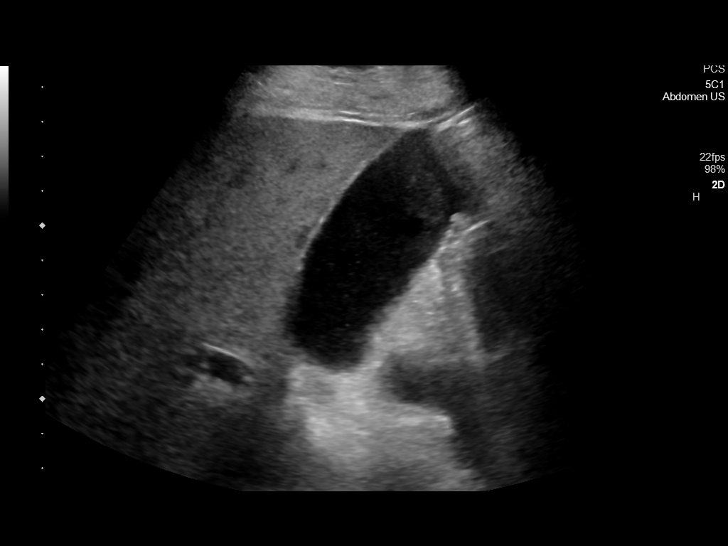
[im 9/52]
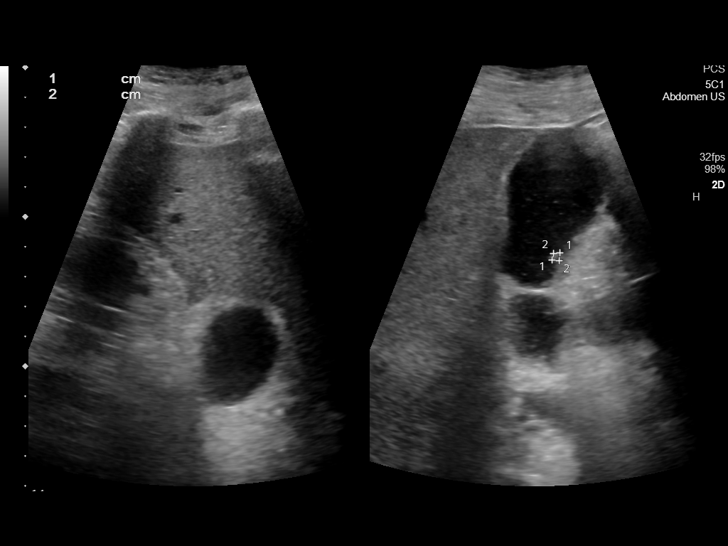
[im 13/52]
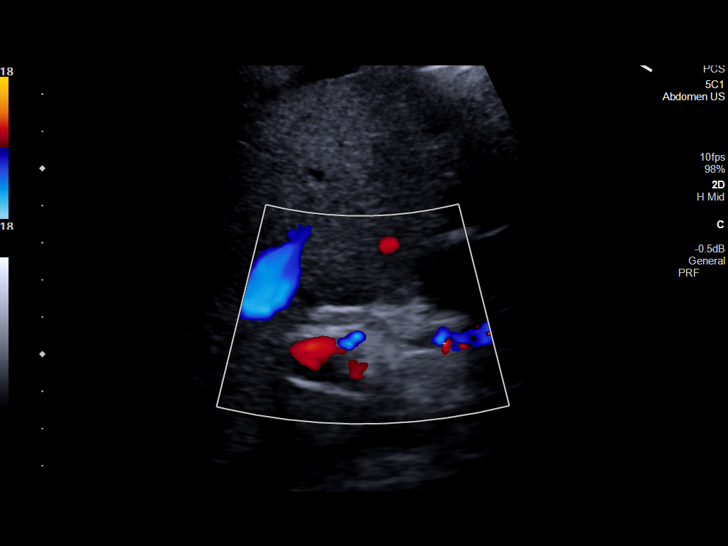
[im 18/52]
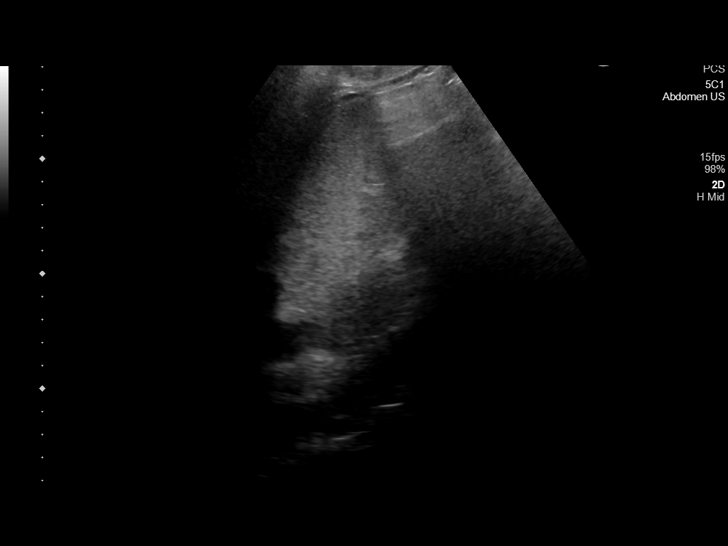
[im 20/52]
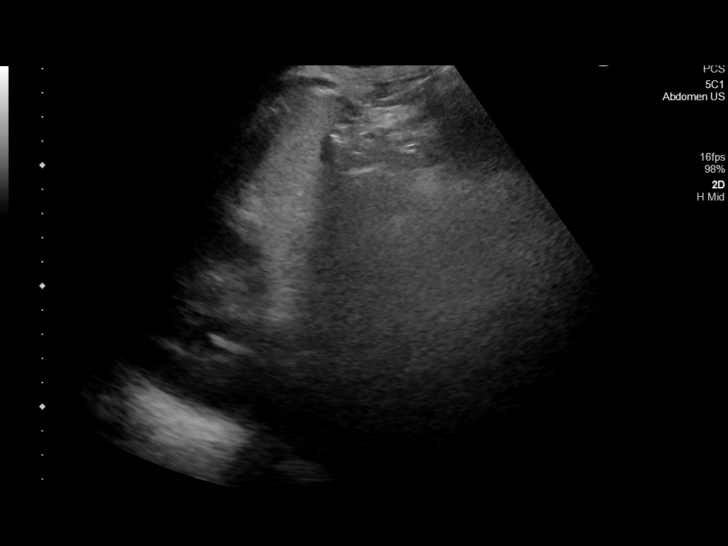
[im 24/52]
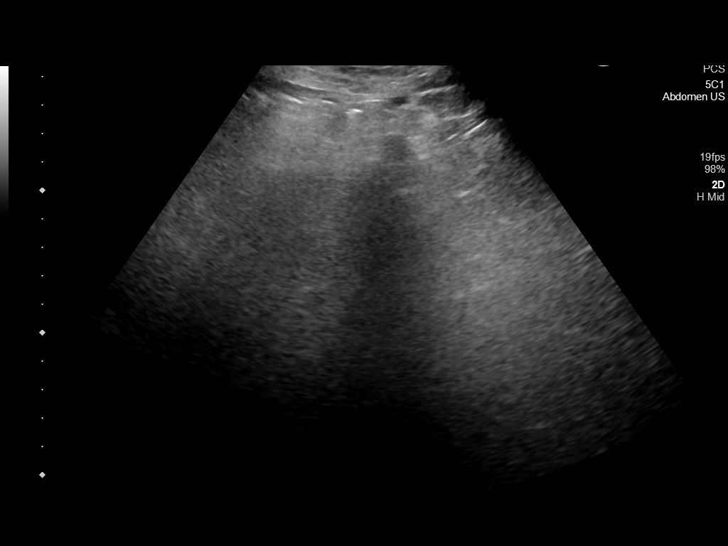
[im 28/52]
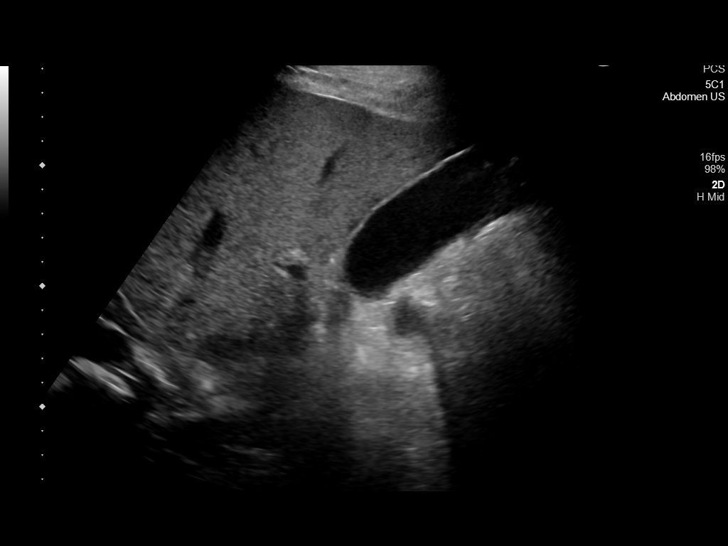
[im 32/52]
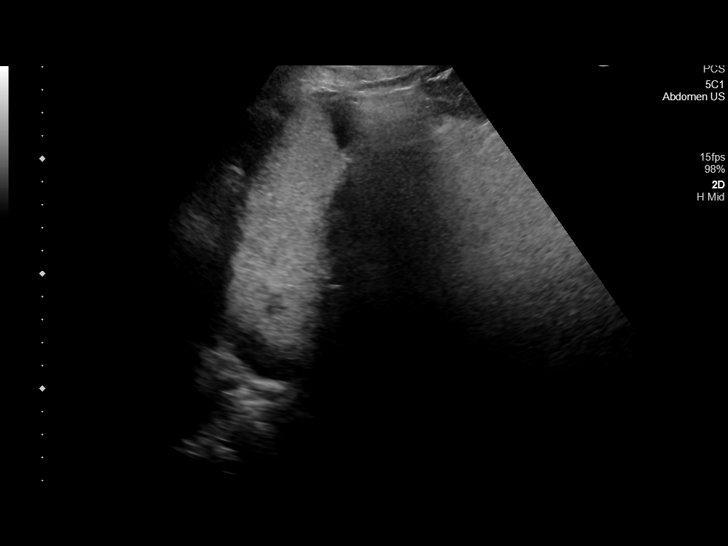
[im 35/52]
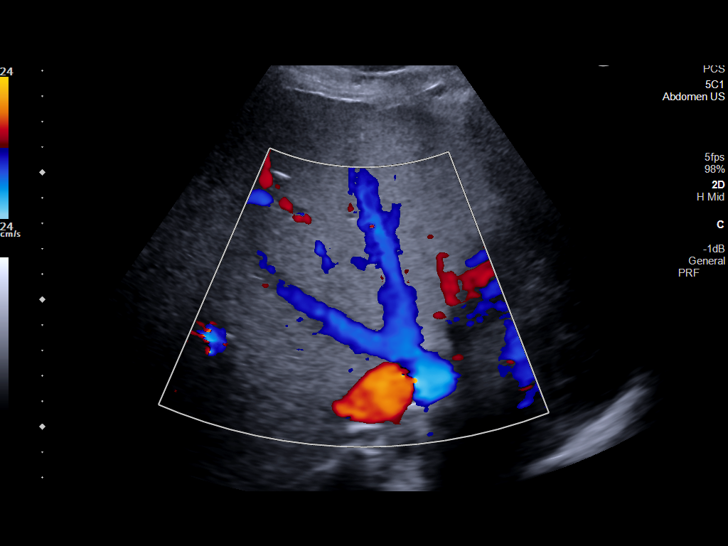
[im 39/52]
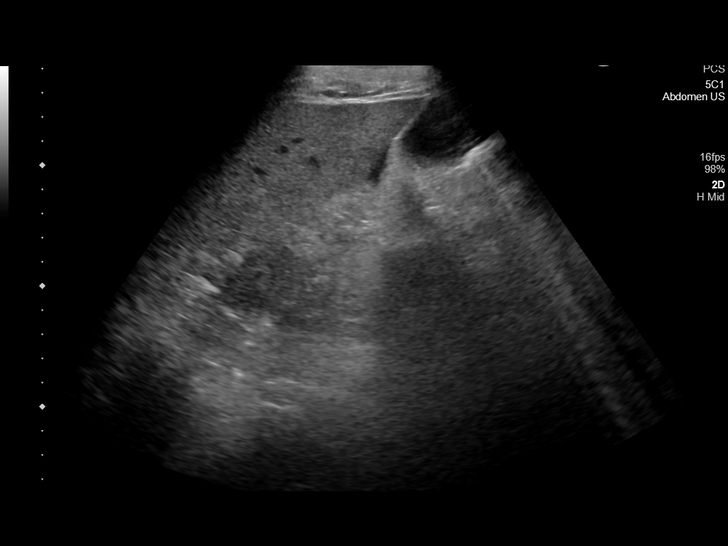
[im 43/52]
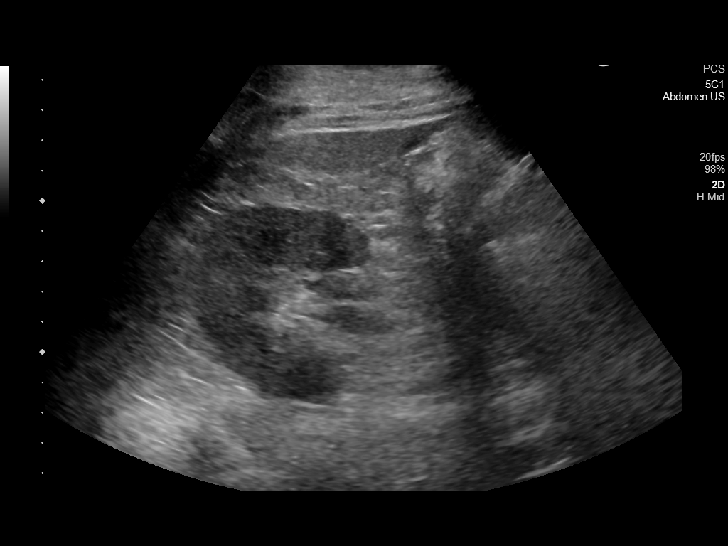
[im 47/52]
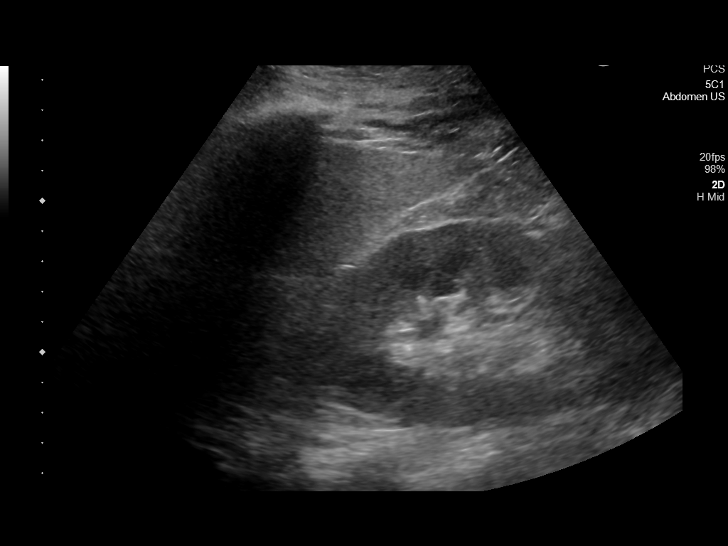
[im 52/52]
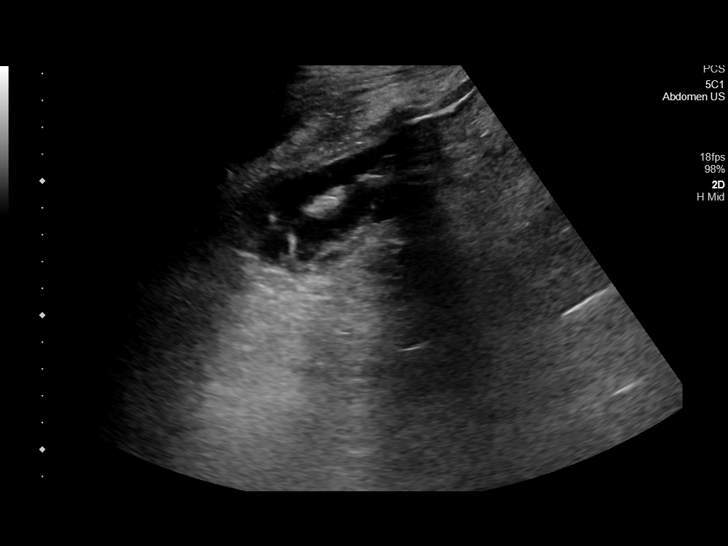

[14 of 25 positions shown; findings below may reference images not displayed]

FINDINGS: Gallbladder:

There is a moderately large volume of sludge within the gallbladder.
Two small polyps are identified each measuring approximately 0.4 cm.
No wall thickening or pericholecystic fluid. Sonographer reports
negative Murphy's sign.

Common bile duct:

Diameter: 0.4 cm

Liver:

No focal lesion is seen. Echogenicity is increased. Portal vein is
patent on color Doppler imaging with normal direction of blood flow
towards the liver.

Other: A small volume of abdominal and pelvic ascites is present.
IMPRESSION: Fatty infiltration of the liver.

Small volume of ascites.

Gallbladder sludge. Negative for stones or evidence of
cholecystitis.

Two 0.4 cm gallbladder polyps. Per consensus statement, gallbladder
polyps less than 6 mm are usually benign and not require follow-up.

## 2020-07-08 IMAGING — DX DG CHEST 1V PORT
1 series · 1 of 1 positions shown · non-contrast
Comparison: [DATE].

CLINICAL DATA: Intubation.

EXAM:
PORTABLE CHEST 1 VIEW

[chest ap]
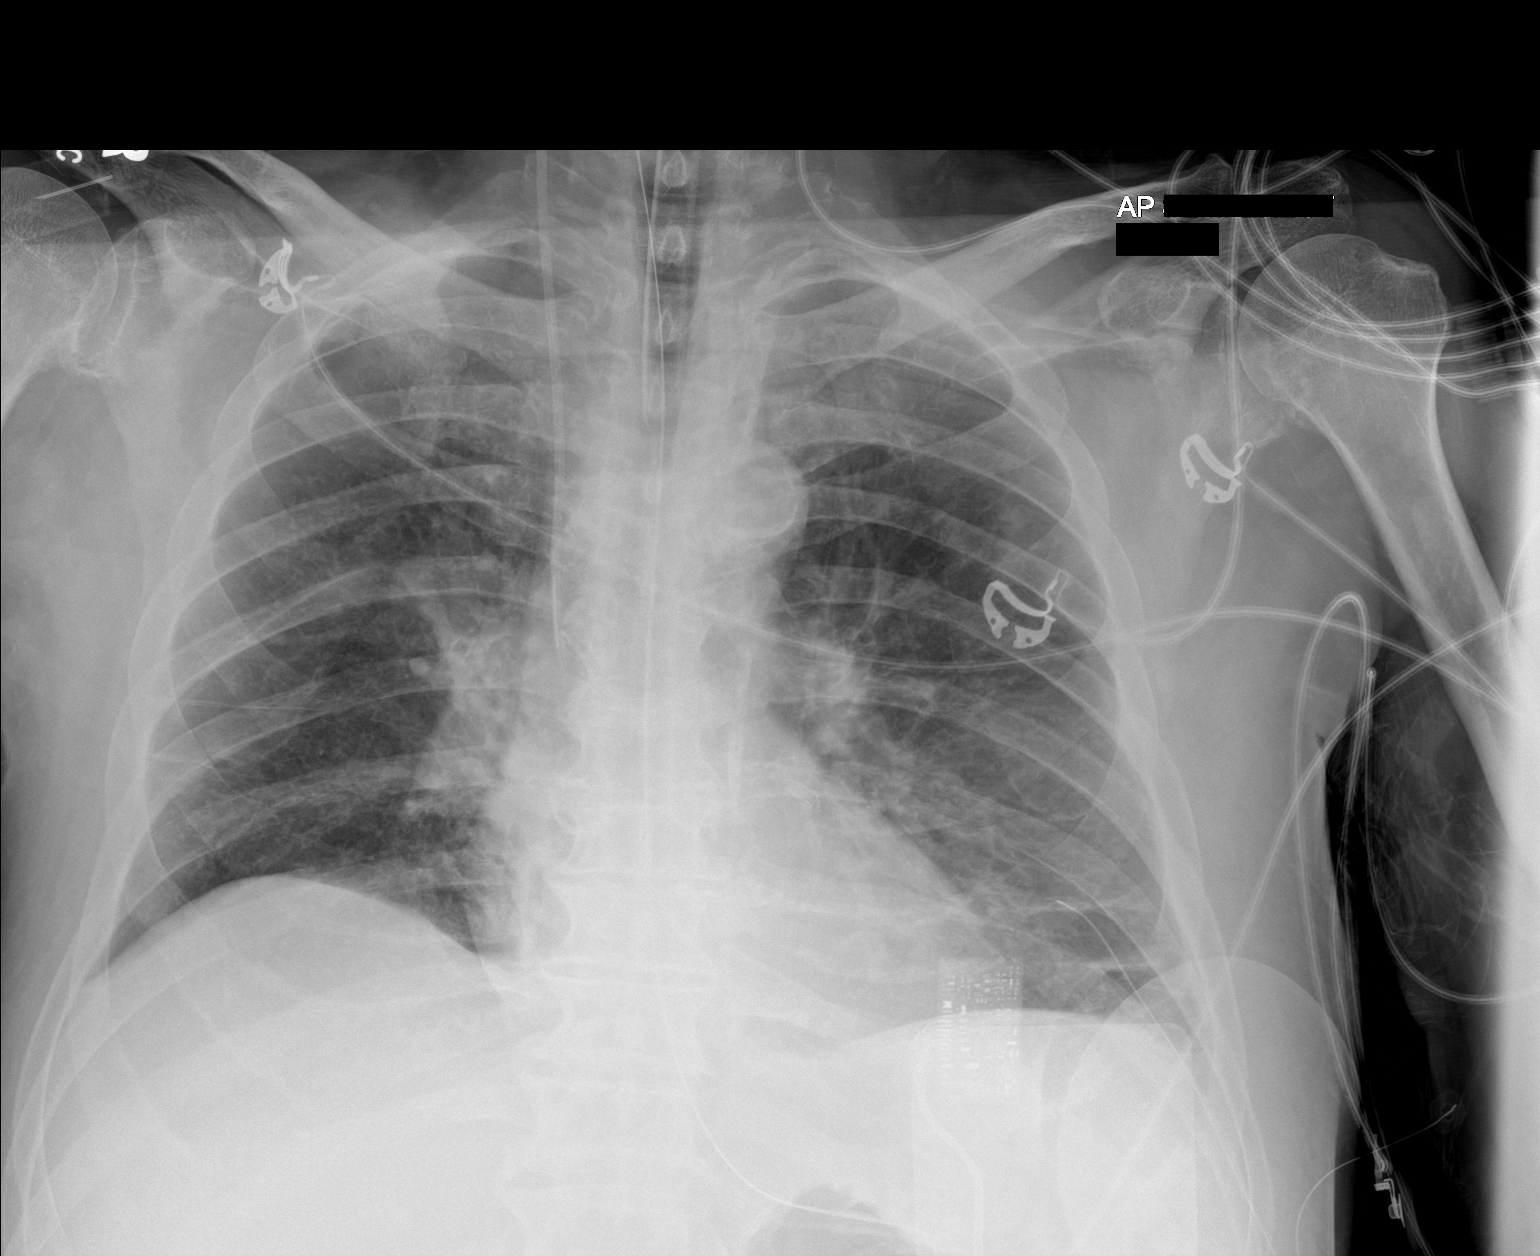

[1 of 1 positions shown; findings below may reference images not displayed]

FINDINGS: Interim removal of left IJ line. Endotracheal tube noted with tip 3
cm above the carina. NG tube, right IJ line in stable position.
Heart size normal. Low lung volumes with bilateral subsegmental
atelectasis. Interim improvement in aeration from prior exam.
Persistent mild bilateral interstitial prominence. Interim clearing
of bilateral pleural effusions. No pneumothorax.
IMPRESSION: 1. Interim removal of left IJ line. Endotracheal tube noted with tip
3 cm above the carina. NG tube, right IJ line stable position.

2. Low lung volumes with bilateral subsegmental atelectasis. Interim
improvement in aeration from prior exam. Persistent mild bilateral
interstitial prominence. Interim clearing of bilateral pleural
effusions.

## 2020-07-08 MED ORDER — FENTANYL 2500MCG IN NS 250ML (10MCG/ML) PREMIX INFUSION
0.0000 ug/h | INTRAVENOUS | Status: DC
  Administered 2020-07-08 – 2020-07-11 (×5): 200 ug/h via INTRAVENOUS
  Filled 2020-07-08 (×5): qty 250

## 2020-07-08 MED ORDER — SODIUM BICARBONATE 8.4 % IV SOLN: 150.0000 meq | Freq: Once | INTRAVENOUS | Status: DC

## 2020-07-08 MED ORDER — NOREPINEPHRINE 4 MG/250ML-% IV SOLN
INTRAVENOUS | Status: AC
  Administered 2020-07-08: 10 ug/min via INTRAVENOUS
  Filled 2020-07-08: qty 250

## 2020-07-08 MED ORDER — INSULIN ASPART 100 UNIT/ML ~~LOC~~ SOLN
20.0000 [IU] | SUBCUTANEOUS | Status: AC
  Administered 2020-07-08: 20 [IU] via SUBCUTANEOUS
  Filled 2020-07-08: qty 0.2

## 2020-07-08 MED ORDER — POTASSIUM CHLORIDE 10 MEQ/100ML IV SOLN
10.0000 meq | INTRAVENOUS | Status: AC
  Administered 2020-07-08 (×3): 10 meq via INTRAVENOUS
  Filled 2020-07-08 (×3): qty 100

## 2020-07-08 MED ORDER — INSULIN ASPART 100 UNIT/ML ~~LOC~~ SOLN
0.0000 [IU] | SUBCUTANEOUS | Status: DC
  Filled 2020-07-08: qty 1

## 2020-07-08 MED ORDER — FENTANYL 2500MCG IN NS 250ML (10MCG/ML) PREMIX INFUSION
INTRAVENOUS | Status: AC
  Administered 2020-07-08: 200 ug/h
  Filled 2020-07-08: qty 250

## 2020-07-08 MED ORDER — NOREPINEPHRINE 4 MG/250ML-% IV SOLN: 0.0000 ug/min | INTRAVENOUS | Status: DC

## 2020-07-08 MED ORDER — CHLORHEXIDINE GLUCONATE 0.12% ORAL RINSE (MEDLINE KIT)
15.0000 mL | Freq: Two times a day (BID) | OROMUCOSAL | Status: DC
  Administered 2020-07-08 – 2020-07-14 (×14): 15 mL via OROMUCOSAL

## 2020-07-08 MED ORDER — SODIUM CHLORIDE 0.9% IV SOLUTION: Freq: Once | INTRAVENOUS | Status: DC

## 2020-07-08 MED ORDER — ALBUMIN HUMAN 25 % IV SOLN
25.0000 g | Freq: Four times a day (QID) | INTRAVENOUS | Status: AC
  Administered 2020-07-08 – 2020-07-09 (×4): 25 g via INTRAVENOUS
  Filled 2020-07-08 (×5): qty 100

## 2020-07-08 MED ORDER — INSULIN ASPART 100 UNIT/ML ~~LOC~~ SOLN
0.0000 [IU] | SUBCUTANEOUS | Status: DC
  Administered 2020-07-09: 15 [IU] via SUBCUTANEOUS
  Administered 2020-07-09 (×2): 7 [IU] via SUBCUTANEOUS
  Administered 2020-07-09: 15 [IU] via SUBCUTANEOUS
  Administered 2020-07-09: 20 [IU] via SUBCUTANEOUS
  Administered 2020-07-09 (×2): 15 [IU] via SUBCUTANEOUS
  Administered 2020-07-10 (×2): 20 [IU] via SUBCUTANEOUS
  Filled 2020-07-08 (×9): qty 1

## 2020-07-08 MED ORDER — HYDROCORTISONE NA SUCCINATE PF 100 MG IJ SOLR
50.0000 mg | Freq: Three times a day (TID) | INTRAMUSCULAR | Status: DC
  Administered 2020-07-08 – 2020-07-11 (×10): 50 mg via INTRAVENOUS
  Filled 2020-07-08 (×10): qty 2

## 2020-07-08 MED ORDER — TRACE MINERALS CU-MN-SE-ZN 300-55-60-3000 MCG/ML IV SOLN
INTRAVENOUS | Status: AC
  Filled 2020-07-08: qty 691.2

## 2020-07-08 MED ORDER — ORAL CARE MOUTH RINSE
15.0000 mL | OROMUCOSAL | Status: DC
  Administered 2020-07-08 – 2020-07-15 (×66): 15 mL via OROMUCOSAL

## 2020-07-08 MED ORDER — TRACE MINERALS CU-MN-SE-ZN 300-55-60-3000 MCG/ML IV SOLN: INTRAVENOUS | Status: DC

## 2020-07-08 MED ORDER — PIPERACILLIN-TAZOBACTAM IN DEX 2-0.25 GM/50ML IV SOLN
2.2500 g | Freq: Three times a day (TID) | INTRAVENOUS | Status: DC
  Administered 2020-07-08 – 2020-07-10 (×6): 2.25 g via INTRAVENOUS
  Filled 2020-07-08 (×8): qty 50

## 2020-07-08 NOTE — Progress Notes (Signed)
*  PRELIMINARY RESULTS* Echocardiogram 2D Echocardiogram has been performed.  Cristela Blue 07/08/2020, 1:28 PM

## 2020-07-08 NOTE — Progress Notes (Signed)
Pt was transported to CT from CCU and back while on the vent. 

## 2020-07-08 NOTE — Progress Notes (Signed)
[facility]  ROUNDING NOTE   Subjective:   2/19- patient extubated 2/20- 1st HD 2/21-iv lasix- good response 2/23 patient is now reintubated  Gen- NAD Neuro-sedated on the vent Pulm-now extubated, [place] O2, + cough cv- on levophed Gi-Now on TPN; Renal-UOP low today   Objective:  Vital signs in last 24 hours:  Temp:  [97 F (36.1 C)-99.6 F (37.6 C)] 99.6 F (37.6 C) (02/22 2000) Pulse Rate:  [115-125] 115 (02/22 2000) Resp:  [29-36] 30 (02/22 2000) BP: (114-143)/(64-76) 135/66 (02/22 2000) SpO2:  [90 %-100 %] 92 % (02/22 2000) FiO2 (%):  [70 %] 70 % (02/23 0833) Weight:  [77.3 kg] 77.3 kg (02/23 0426)  Weight change: -3.1 kg Filed Weights   07/05/20 0400 07/07/20 0500 07/08/20 0426  Weight: 80.4 kg 80.4 kg 77.3 kg    Intake/Output: I/O last 3 completed shifts: In: 2297.8 [I.V.:978.4; Blood:390; Other:100; IV Piggyback:829.4] Out: 4700 [Urine:3700; Emesis/NG output:1000]   Intake/Output this shift:  No intake/output data recorded.  Physical Exam: General: Critically ill  HEENT: NGT in place, mouth is dry  Lungs:  [place] O2, mild coarse breath sounds  Heart: Regular rate and rhythm  Abdomen:  +distended, dressings clean, decreased breath sounds  Extremities: 2+peripheral edema over thighs. Scrotal edema  Neurologic: Sedated on the vent  Skin: No lesions  Access: None at this time  Foley in place  Basic Metabolic Panel: Recent Labs  Lab 07/04/20 0419 07/05/20 0443 07/06/20 0434 07/07/20 0431 07/08/20 0613  NA 147* 144 144 146* 151*  K 4.4 4.2 3.7 3.1* 3.2*  CL 101 99 100 102 106  CO2 32 _0 GLUCOSE 112* 115* 145* 157* 332*  BUN 63* 79* 73* 88* 96*  CREATININE 4.25* 5.20* 4.68* 4.83* 4.56*  CALCIUM 6.0* 6.2* 6.8* 6.8* 6.6*  MG 2.1 2.3 2.2 2.1 2.6*  PHOS 6.0* 7.8* 6.6* 6.8* 8.3*    Liver Function Tests: Recent Labs  Lab 07/03/20 0405 07/04/20 0419 07/05/20 0443 07/06/20 0434 07/08/20 0613  AST 1,152* 491* 386* 262* 182*   ALT 581* 222* 125* 70* 45*  ALKPHOS 66 87 122 131* 171*  BILITOT 0.6 0.6 1.4* 1.6* 1.9*  PROT 3.8* 3.9* 4.5* 4.5* 4.6*  ALBUMIN 2.2* 1.9* 2.0* 2.1* 1.7*   No results for input(s): LIPASE, AMYLASE in the last 168 hours. No results for input(s): AMMONIA in the last 168 hours.  CBC: Recent Labs  Lab 07/04/20 0419 07/04/20 1053 07/05/20 0443 07/06/20 0434 07/07/20 0431 07/08/20 0613  WBC 25.2*  --  11.2* 10.9* 22.3* 20.5*  NEUTROABS  --   --   --   --   --  15.6*  HGB 8.6* 8.3* 7.9* 7.0* 9.4* 7.9*  HCT 26.6* 25.0* 24.0* 21.0* 27.1* 24.3*  MCV 82.9  --  82.8 81.7 80.7 85.3  PLT 120*  --  112* 108* 118* 127*    Cardiac Enzymes: Recent Labs  Lab 07/02/20 0346 07/02/20 0753  CKTOTAL 114 178    BNP: Invalid input(s): POCBNP  CBG: Recent Labs  Lab 07/07/20 1502 07/07/20 1938 07/07/20 2338 07/08/20 0401 07/08/20 0729  GLUCAP 128* 157* 208* 42* 265*    Microbiology: Results for orders placed or performed during the hospital encounter of 06/18/2020  MRSA PCR Screening     Status: None   Collection Time: 07/05/2020  4:32 PM   Specimen: Nasopharyngeal  Result Value Ref Range Status   MRSA by PCR NEGATIVE NEGATIVE Final    Comment:  The GeneXpert MRSA Assay (FDA approved for NASAL specimens only), is one component of a comprehensive MRSA colonization surveillance program. It is not intended to diagnose MRSA infection nor to guide or monitor treatment for MRSA infections. Performed at [facility], [address] [place], [place] [address]   CULTURE, BLOOD (ROUTINE X 2) w Reflex to ID Panel     Status: None   Collection Time: 06/19/2020  6:07 PM   Specimen: BLOOD  Result Value Ref Range Status   Specimen Description BLOOD BLOOD RIGHT HAND  Final   Special Requests   Final    BOTTLES DRAWN AEROBIC AND ANAEROBIC Blood Culture results may not be optimal due to an inadequate volume of blood received in culture bottles   Culture   Final    NO GROWTH  5 DAYS Performed at [facility], [address] [place], [place] [address]    Report Status 07/06/2020 FINAL  Final  CULTURE, BLOOD (ROUTINE X 2) w Reflex to ID Panel     Status: None   Collection Time: 07/03/2020  6:07 PM   Specimen: BLOOD  Result Value Ref Range Status   Specimen Description BLOOD LEFT THUMB  Final   Special Requests   Final    BOTTLES DRAWN AEROBIC AND ANAEROBIC Blood Culture adequate volume   Culture   Final    NO GROWTH 5 DAYS Performed at [facility], [address] [place], [place] [address]    Report Status 07/06/2020 FINAL  Final  Culture, Respiratory w Gram Stain     Status: None   Collection Time: 07/03/20  2:17 PM   Specimen: Tracheal Aspirate; Respiratory  Result Value Ref Range Status   Specimen Description   Final    TRACHEAL ASPIRATE Performed at [facility], [address] [place], [place] [address]    Special Requests   Final    NONE Performed at [facility], [address] [place], [place] [address]    Gram Stain   Final    FEW WBC PRESENT, PREDOMINANTLY PMN ABUNDANT GRAM NEGATIVE RODS Performed at [facility], [address] [place], [place] [address]    Culture   Final    ABUNDANT ESCHERICHIA COLI Confirmed Extended Spectrum Beta-Lactamase Producer (ESBL).  In bloodstream infections from ESBL organisms, carbapenems are preferred over piperacillin/tazobactam. They are shown to have a lower risk of mortality.    Report Status 07/08/2020 FINAL  Final   Organism ID, Bacteria ESCHERICHIA COLI  Final      Susceptibility   Escherichia coli - MIC*    AMPICILLIN >=32 RESISTANT Resistant     CEFAZOLIN >=64 RESISTANT Resistant     CEFEPIME 8 INTERMEDIATE Intermediate     CEFTAZIDIME RESISTANT Resistant     CEFTRIAXONE >=64 RESISTANT Resistant     CIPROFLOXACIN >=4 RESISTANT Resistant     GENTAMICIN <=1 SENSITIVE Sensitive     IMIPENEM <=0.25 SENSITIVE Sensitive     TRIMETH/SULFA <=20  SENSITIVE Sensitive     AMPICILLIN/SULBACTAM >=32 RESISTANT Resistant     PIP/TAZO <=4 SENSITIVE Sensitive     * ABUNDANT ESCHERICHIA COLI  Gastrointestinal Panel by PCR , Stool     Status: None   Collection Time: 07/07/20 12:05 PM   Specimen: Stool  Result Value Ref Range Status   Campylobacter species NOT DETECTED NOT DETECTED Final   Plesimonas shigelloides NOT DETECTED NOT DETECTED Final   Salmonella species NOT DETECTED NOT DETECTED Final   Yersinia enterocolitica   NOT DETECTED NOT DETECTED Final   Vibrio species NOT DETECTED NOT DETECTED Final   Vibrio cholerae NOT DETECTED NOT DETECTED Final   Enteroaggregative E coli (EAEC) NOT DETECTED NOT DETECTED Final   Enteropathogenic E coli (EPEC) NOT DETECTED NOT DETECTED Final   Enterotoxigenic E coli (ETEC) NOT DETECTED NOT DETECTED Final   Shiga like toxin producing E coli (STEC) NOT DETECTED NOT DETECTED Final   Shigella/Enteroinvasive E coli (EIEC) NOT DETECTED NOT DETECTED Final   Cryptosporidium NOT DETECTED NOT DETECTED Final   Cyclospora cayetanensis NOT DETECTED NOT DETECTED Final   Entamoeba histolytica NOT DETECTED NOT DETECTED Final   Giardia lamblia NOT DETECTED NOT DETECTED Final   Adenovirus F40/41 NOT DETECTED NOT DETECTED Final   Astrovirus NOT DETECTED NOT DETECTED Final   Norovirus GI/GII NOT DETECTED NOT DETECTED Final   Rotavirus A NOT DETECTED NOT DETECTED Final   Sapovirus (I, II, IV, and V) NOT DETECTED NOT DETECTED Final    Comment: Performed at [facility], [address] [place], [place] [address]  C Difficile Quick Screen w PCR reflex     Status: None   Collection Time: 07/07/20 12:05 PM  Result Value Ref Range Status   C Diff antigen NEGATIVE NEGATIVE Final   C Diff toxin NEGATIVE NEGATIVE Final   C Diff interpretation No C. difficile detected.  Final    Comment: Performed at [facility], [address] [place], [place] [address]  CULTURE, BLOOD (ROUTINE X 2) w Reflex to ID Panel      Status: None (Preliminary result)   Collection Time: 07/07/20  1:38 PM   Specimen: BLOOD  Result Value Ref Range Status   Specimen Description BLOOD BLOOD LEFT HAND  Final   Special Requests   Final    BOTTLES DRAWN AEROBIC AND ANAEROBIC Blood Culture adequate volume   Culture   Final    NO GROWTH < 24 HOURS Performed at [facility], [address], [place], [place] [address]    Report Status PENDING  Incomplete  CULTURE, BLOOD (ROUTINE X 2) w Reflex to ID Panel     Status: None (Preliminary result)   Collection Time: 07/07/20  1:38 PM   Specimen: BLOOD  Result Value Ref Range Status   Specimen Description BLOOD BLOOD RIGHT HAND  Final   Special Requests   Final    BOTTLES DRAWN AEROBIC AND ANAEROBIC Blood Culture adequate volume   Culture   Final    NO GROWTH < 24 HOURS Performed at [facility], [address] [place], [place] [address]    Report Status PENDING  Incomplete  SARS CORONAVIRUS 2 (TAT 6-24 HRS) Nasopharyngeal Nasopharyngeal Swab     Status: None   Collection Time: 07/07/20  2:10 PM   Specimen: Nasopharyngeal Swab  Result Value Ref Range Status   SARS Coronavirus 2 NEGATIVE NEGATIVE Final    Comment: (NOTE) SARS-CoV-2 target nucleic acids are NOT DETECTED.  The SARS-CoV-2 RNA is generally detectable in upper and lower respiratory specimens during the acute phase of infection. Negative results do not preclude SARS-CoV-2 infection, do not rule out co-infections with other pathogens, and should not be used as the sole basis for treatment or other patient management decisions. Negative results must be combined with clinical observations, patient history, and epidemiological information. The expected result is Negative.  Fact Sheet for Patients: SugarRoll.be  Fact Sheet for Healthcare Providers: https://www.woods-mathews.com/  This test is not yet approved or cleared by the [place]  FDA and   has been authorized for detection and/or diagnosis of SARS-CoV-2 by FDA under an Emergency Use Authorization (EUA). This EUA will remain  in effect (meaning this test can be used) for the duration of the COVID-19 declaration under Se ction 564(b)(1) of the Act, 21 U.S.C. section 360bbb-3(b)(1), unless the authorization is terminated or revoked sooner.  Performed at [facility], [place] [address], [place], [place] [address]     Coagulation Studies: Recent Labs    07/08/20 4944  LABPROT 17.7*  INR 1.5*    Urinalysis: Recent Labs    07/07/20 1043  COLORURINE YELLOW*  LABSPEC 1.012  PHURINE 5.0  GLUCOSEU 150*  HGBUR LARGE*  BILIRUBINUR NEGATIVE  KETONESUR NEGATIVE  PROTEINUR 30*  NITRITE NEGATIVE  LEUKOCYTESUR NEGATIVE      Imaging: DG Abd 1 View  Result Date: 07/08/2020 CLINICAL DATA:  Intubation.  OG tube placement. EXAM: ABDOMEN - 1 VIEW COMPARISON:  Chest x-ray 07/07/2020. FINDINGS: Endotracheal tube not visualized. Central line noted with tip over SVC. OG tube noted with tip over stomach. Prominent bowel distention noted. Abdominal series should be considered for further evaluation. No free air identified. Degenerative change thoracolumbar spine. Surgical staples noted over the abdomen. IMPRESSION: 1. Endotracheal tube not visualized. Central line noted with tip over SVC. OG tube noted with tip over the stomach. 2. Prominent bowel distention noted. Abdominal series should be considered for further evaluation. No free air identified. Electronically Signed   By: Marcello Moores  Register   On: 07/08/2020 07:00   DG Abd 1 View  Result Date: 07/06/2020 CLINICAL DATA:  Nasogastric tube placement EXAM: ABDOMEN - 1 VIEW COMPARISON:  July 03, 2020 FINDINGS: Nasogastric tube tip and side port are in the stomach. There are loops of dilated bowel without appreciable air-fluid level. No evident free air. Lung bases clear. Skin staples to the right of midline noted. IMPRESSION:  Nasogastric tube tip and side port in stomach. Suspect a degree of bowel ileus. No evident free air on semi-erect image. Electronically Signed   By: Lowella Grip III M.D.   On: 07/06/2020 19:09   US Venous Img Lower Bilateral (DVT)  Result Date: 07/07/2020 CLINICAL DATA:  65 year old male with a the lower extremity edema. EXAM: BILATERAL LOWER EXTREMITY VENOUS DOPPLER ULTRASOUND TECHNIQUE: Gray-scale sonography with graded compression, as well as color Doppler and duplex ultrasound were performed to evaluate the lower extremity deep venous systems from the level of the common femoral vein and including the common femoral, femoral, profunda femoral, popliteal and calf veins including the posterior tibial, peroneal and gastrocnemius veins when visible. The superficial great saphenous vein was also interrogated. Spectral Doppler was utilized to evaluate flow at rest and with distal augmentation maneuvers in the common femoral, femoral and popliteal veins. COMPARISON:  None. FINDINGS: RIGHT LOWER EXTREMITY Common Femoral Vein: Not visualized due to overlying postoperative changes. Saphenofemoral Junction: Not visualized due to overlying postoperative changes. Profunda Femoral Vein: Not visualized due to overlying postoperative changes. Femoral Vein: No evidence of thrombus. Normal compressibility, respiratory phasicity and response to augmentation. Popliteal Vein: No evidence of thrombus. Normal compressibility, respiratory phasicity and response to augmentation. Calf Veins: No evidence of thrombus. Normal compressibility and flow on color Doppler imaging. Other Findings:  None. LEFT LOWER EXTREMITY Common Femoral Vein: Not visualized due to overlying postoperative changes. Saphenofemoral Junction: Not visualized due to overlying postoperative changes. Profunda Femoral Vein: Not visualized due to overlying postoperative changes. Femoral Vein: No evidence of thrombus. Normal compressibility, respiratory  phasicity and response to augmentation. Popliteal Vein: No evidence of thrombus. Normal compressibility, respiratory phasicity and response to augmentation. Calf Veins: No evidence of thrombus. Normal compressibility and flow on color Doppler imaging. Other Findings:  None. IMPRESSION: No evidence of bilateral lower extremity deep vein thrombosis from the central femoral veins to the calf veins. The bilateral common femoral veins in the confluence are not visualized due to overlying postsurgical changes. Dylan Suttle, MD [facility]  Radiology Electronically Signed   By: Dylan  Suttle MD   On: 07/07/2020 11:43   Portable Chest x-ray  Result Date: 07/08/2020 CLINICAL DATA:  Intubation. EXAM: PORTABLE CHEST 1 VIEW COMPARISON:  07/07/2020. FINDINGS: Interim removal of left IJ line. Endotracheal tube noted with tip 3 cm above the carina. NG tube, right IJ line in stable position. Heart size normal. Low lung volumes with bilateral subsegmental atelectasis. Interim improvement in aeration from prior exam. Persistent mild bilateral interstitial prominence. Interim clearing of bilateral pleural effusions. No pneumothorax. IMPRESSION: 1. Interim removal of left IJ line. Endotracheal tube noted with tip 3 cm above the carina. NG tube, right IJ line stable position. 2. Low lung volumes with bilateral subsegmental atelectasis. Interim improvement in aeration from prior exam. Persistent mild bilateral interstitial prominence. Interim clearing of bilateral pleural effusions. Electronically Signed   By: Thomas  Register   On: 07/08/2020 06:37   DG Chest Port 1 View  Result Date: 07/07/2020 CLINICAL DATA:  Shortness of breath. EXAM: PORTABLE CHEST 1 VIEW COMPARISON:  July 06, 2020. FINDINGS: Stable cardiomediastinal silhouette. Nasogastric tube is unchanged. Bilateral internal jugular catheters are unchanged. No pneumothorax is noted. Mild bibasilar atelectasis or  infiltrates are noted with probable small pleural effusions. Bony thorax is unremarkable. IMPRESSION: Mild bibasilar atelectasis or infiltrates are noted with probable small pleural effusions. Electronically Signed   By: James  Green Jr M.D.   On: 07/07/2020 11:22     Medications:   . sodium chloride 10 mL/hr at 07/08/20 0655  . sodium chloride    . albumin human    . anticoagulant sodium citrate    . ceFEPime (MAXIPIME) IV Stopped (07/07/20 1216)  . dextrose Stopped (07/07/20 1147)  . magnesium sulfate bolus IVPB    . metronidazole 500 mg (07/08/20 0856)  . norepinephrine (LEVOPHED) Adult infusion 10 mcg/min (07/08/20 0700)  . TPN ADULT (ION) 40 mL/hr at 07/08/20 0300   . sodium chloride   Intravenous Once  . sodium chloride   Intravenous Once  . aspirin EC  81 mg Oral Q0600  . chlorhexidine gluconate (MEDLINE KIT)  15 mL Mouth Rinse BID  . Chlorhexidine Gluconate Cloth  6 each Topical Once  . collagenase  1 application Topical Daily  . docusate  100 mg Per Tube Daily  . hydrocortisone sod succinate (SOLU-CORTEF) inj  50 mg Intravenous Q6H  . insulin aspart  0-6 Units Subcutaneous Q4H  . insulin detemir  6 Units Subcutaneous BID  . mouth rinse  15 mL Mouth Rinse 10 times per day  . midodrine  10 mg Per Tube TID WC  . pantoprazole (PROTONIX) IV  40 mg Intravenous Q12H  . sodium bicarbonate  150 mEq Intravenous Once   sodium chloride, acetaminophen **OR** acetaminophen, alum & mag hydroxide-simeth, anticoagulant sodium citrate, hydrALAZINE, magnesium sulfate bolus IVPB, morphine injection, oxyCODONE-acetaminophen, phenol, senna-docusate, sorbitol, white petrolatum  Assessment/ Plan:  Ivan Larsen is a 64 y.o. white male with peripheral vascular disease, coronary artery disease, nephrolithiasis, hyperlipidemia, diabetes mellitus type II,   who was admitted to [facility] on 07/08/2020 for Atherosclerosis of artery of extremity with ulceration (HCC) [I70.299, L97.909] S/P  aorto-bifemoral bypass surgery [Z95.828] Underwent bilateral femoral artery bypass graft by Dr. Dew and Dr. Schnier. on 06/29/2020   #. Acute renal failure : with baseline creatinine of 0.9 with normal GFR on 06/25/20.  History of glycosuria and underlying diabetic kidney disease.  AKI likely ATN due to hypotension  02/22 0701 - 02/23 0700 In: 1862.1 [I.V.:642.7; Blood:390; IV Piggyback:729.4] Out: 2500 [Urine:1900; Emesis/NG output:600]  Lab Results  Component Value Date   CREATININE 4.56 (H) 07/08/2020   CREATININE 4.83 (H) 07/07/2020   CREATININE 4.68 (H) 07/06/2020     UOP is low at this time.  Continues to have large amount of edema - Avoid hypotension - supportive care Patient pulled out HD catheter Will monitor closely for the need for HD Agree with renal artery dopplers Will monitor UOP Spoke to patients dtr at bed side and explained the possible need for RRT with in the next 24-48 hrs.   # hypokalemia  Being supplemented as per ICU protocol  # Hypernatremia -  improved  # Generalized edema Iv lasix x 1, good response Monitor for now    LOS: 7 Harmeet Singh 2/23/202210:10 AM  

## 2020-07-08 NOTE — Progress Notes (Signed)
PT Cancellation Note  Patient Details Name: Ivan Larsen MRN: [id number] DOB: 1955-12-01   Cancelled Treatment:    Reason Eval/Treat Not Completed: Medical issues which prohibited therapy.  Chart reviewed.  Pt noted with medical complications this morning (pt found unresponsive, pulseless in PEA; CPR initiated; and now intubated).  D/t change/decline in medical status, per PT protocol, will sign off.  Please re-consult PT when pt is medically appropriate to participate in physical therapy.  [name], PT 07/08/20, 9:15 AM

## 2020-07-08 NOTE — Progress Notes (Signed)
[facility]  Daily Progress Note   Subjective  -   Had code event this am. Hypoxia and bradycardia. Now intubated and on pressors. Family at bedside.  Objective Vitals:   07/08/20 1000 07/08/20 1100 07/08/20 1200 07/08/20 1300  BP: 122/63 132/64 (!) 145/66 111/60  Pulse: 99 (!) 101 (!) 106 99  Resp: 20 20 20 20   Temp:   99.4 F (37.4 C)   TempSrc:   Oral   SpO2: 97% 98% 99% 98%  Weight:      Height:        Intake/Output Summary (Last 24 hours) at 07/08/2020 1510 Last data filed at 07/08/2020 1200 Gross per 24 hour  Intake 2038.17 ml  Output 2325 ml  Net -286.83 ml    PULM  Coarse and diminished bilaterally CV  RRR VASC  Feet slightly cool now on pressors, cap refill sluggish. Incisions C/D/I  Laboratory CBC    Component Value Date/Time   WBC 20.5 (H) 07/08/2020 0613   HGB 7.9 (L) 07/08/2020 0613   HGB 14.7 06/04/2020 1441   HCT 24.3 (L) 07/08/2020 0613   HCT 45.6 06/04/2020 1441   PLT 127 (L) 07/08/2020 0613   PLT 320 06/04/2020 1441    BMET    Component Value Date/Time   NA 151 (H) 07/08/2020 0613   NA 140 06/02/2020 1347   NA 141 07/03/2012 0511   K 3.2 (L) 07/08/2020 0613   K 4.0 07/03/2012 0511   CL 106 07/08/2020 0613   CL 108 (H) 07/03/2012 0511   CO2 25 07/08/2020 0613   CO2 24 07/03/2012 0511   GLUCOSE 332 (H) 07/08/2020 0613   GLUCOSE 185 (H) 07/03/2012 0511   BUN 96 (H) 07/08/2020 0613   BUN 24 06/02/2020 1347   BUN 14 07/03/2012 0511   CREATININE 4.56 (H) 07/08/2020 0613   CREATININE 0.86 07/03/2012 0511   CALCIUM 6.6 (L) 07/08/2020 0613   CALCIUM 8.1 (L) 07/03/2012 0511   GFRNONAA 14 (L) 07/08/2020 0613   GFRNONAA >60 07/03/2012 0511   GFRAA 76 06/02/2020 1347   GFRAA >60 07/03/2012 0511    Assessment/Planning: POD #7 s/p Aortabifemoral bypass, renal endarterectomies, femoral endarterectomies   Had code event this am  Stabilized with pressors and intubation, but remains critically ill  Still with renal  failure. Cr. 4.6  For CT of abdomen and pelvis today without contrast. Results pending  Family aware of severity of the situation       07/05/2012  07/08/2020, 3:10 PM

## 2020-07-08 NOTE — Progress Notes (Signed)
Daily Progress Note   Patient Name: Ivan Larsen       Date: 07/08/2020 DOB: 07/03/1955  Age: 65 y.o. MRN#: [id number] Attending Physician: Algernon Huxley, MD Primary Care Physician: Venita Lick, NP Admit Date: 07/13/2020  Reason for Consultation/Follow-up: Establishing goals of care  Subjective: Patient intubated, daughter and sister at bedside  Length of Stay: 7  Current Medications: Scheduled Meds:  . sodium chloride   Intravenous Once  . sodium chloride   Intravenous Once  . aspirin EC  81 mg Oral Q0600  . chlorhexidine gluconate (MEDLINE KIT)  15 mL Mouth Rinse BID  . Chlorhexidine Gluconate Cloth  6 each Topical Once  . collagenase  1 application Topical Daily  . docusate  100 mg Per Tube Daily  . hydrocortisone sod succinate (SOLU-CORTEF) inj  50 mg Intravenous Q8H  . insulin aspart  0-6 Units Subcutaneous Q4H  . insulin detemir  6 Units Subcutaneous BID  . mouth rinse  15 mL Mouth Rinse 10 times per day  . midodrine  10 mg Per Tube TID WC  . pantoprazole (PROTONIX) IV  40 mg Intravenous Q12H  . sodium bicarbonate  150 mEq Intravenous Once    Continuous Infusions: . sodium chloride 20 mL/hr at 07/08/20 1000  . sodium chloride    . albumin human 25 g (07/08/20 1127)  . anticoagulant sodium citrate    . ceFEPime (MAXIPIME) IV 2 g (07/08/20 1214)  . magnesium sulfate bolus IVPB    . metronidazole Stopped (07/08/20 0956)  . norepinephrine (LEVOPHED) Adult infusion 5 mcg/min (07/08/20 1000)  . potassium chloride    . TPN ADULT (ION) 40 mL/hr at 07/08/20 1000  . TPN ADULT (ION)      PRN Meds: sodium chloride, acetaminophen **OR** acetaminophen, alum & mag hydroxide-simeth, anticoagulant sodium citrate, hydrALAZINE, magnesium sulfate bolus IVPB, morphine injection,  oxyCODONE-acetaminophen, phenol, senna-docusate, sorbitol, white petrolatum  Physical Exam Constitutional:      Comments: Intubated, unresponsive  Cardiovascular:     Rate and Rhythm: Regular rhythm. Tachycardia present.             Vital Signs: BP 122/63   Pulse 99   Temp 99.4 F (37.4 C) (Oral)   Resp 20   Ht 5' 5"  (1.651 m)   Wt 77.3 kg   SpO2 97%   BMI 28.36 kg/m  SpO2: SpO2: 97 % O2 Device: O2 Device: Ventilator O2 Flow Rate: O2 Flow Rate (L/min): 2 L/min  Intake/output summary:   Intake/Output Summary (Last 24 hours) at 07/08/2020 1219 Last data filed at 07/08/2020 1000 Gross per 24 hour  Intake 2498.88 ml  Output 2450 ml  Net 48.88 ml   LBM: Last BM Date: 07/07/20 Baseline Weight: Weight: 63.5 kg Most recent weight: Weight: 77.3 kg       Palliative Assessment/Data: PPS 10-30%   Flowsheet Rows   Flowsheet Row Most Recent Value  Intake Tab   Referral Department Hospitalist  Unit at Time of Referral Intermediate Care Unit  Palliative Care Primary Diagnosis Other (Comment)  Date Notified 07/06/20  Palliative Care Type New Palliative care  Reason for referral Clarify Goals of Care  Date of Admission 07/09/2020  Date first seen  by Palliative Care 07/06/20  # of days Palliative referral response time 0 Day(s)  # of days IP prior to Palliative referral 5  Clinical Assessment   Palliative Performance Scale Score 20%  Pain Max last 24 hours Not able to report  Pain Min Last 24 hours Not able to report  Dyspnea Max Last 24 Hours Not able to report  Dyspnea Min Last 24 hours Not able to report  Psychosocial & Spiritual Assessment   Palliative Care Outcomes       Patient Active Problem List   Diagnosis Date Noted  . Atherosclerosis of artery of extremity with ulceration ([place]) 07/10/2020  . S/P aorto-bifemoral bypass surgery 07/11/2020  . Atherosclerosis of native arteries of the extremities with ulceration ([place]) 06/16/2020  . MVP (mitral valve prolapse)  06/03/2020  . Peripheral vascular disease ([place]) 05/30/2020  . Type 2 diabetes mellitus with diabetic neuropathy, without long-term current use of insulin ([place]) 04/14/2020  . Diabetic ulcer of left heel associated with type 2 diabetes mellitus ([place]) 02/07/2020  . Aortic ectasia, abdominal ([place]) 10/11/2016  . Hypertension associated with diabetes ([place]) 09/29/2014  . Hyperlipidemia associated with type 2 diabetes mellitus ([place]) 04/08/2014  . Atherosclerotic heart disease of native coronary artery without angina pectoris 03/24/2013  . GERD (gastroesophageal reflux disease) 03/24/2013  . Stented coronary artery 03/24/2013  . Cataracts, bilateral 01/29/2013  . Cortical cataract of both eyes 01/29/2013  . PSC (posterior subcapsular cataract), bilateral 01/29/2013    Palliative Care Assessment & Plan   HPI: 65 y.o. male  with past medical history of CAD, PAD, DM, history of kidney stones, hyperlipidemia, MI, elevated liver enzymes admitted on 06/28/2020 with S/P aorto-bifemoral bypass surgery on 2/16, he developed hemorrhagic shock and was intubated but ultimately extubated.  Assessment: Unfortunately, patient found pulseless early this AM. ROSC achieved after 9 minutes of CPR. Patient now intubated.   Update received from RN - patient is following commands.   Checking in with family at bedside - daughter and sister. Emotional support provided. Family has understanding of severity of situation. They share about their unfortunate experience with ICU care d/t patient's wife's hospitalization ~ 7 years ago - she was hospitalized for 7 weeks following bowel perforation - required trach.   Family is interested in all interventions offered to Mr. Glenford Peers.  All questions answered. Family given my contact information.   PMT will continue to follow and offer support to family.   Recommendations/Plan:  Continue full scope/full code interventions  Will follow  Code Status:  Full  code  Prognosis:   Unable to determine  Discharge Planning:  To Be Determined  Care plan was discussed with RN, patient's family  Thank you for allowing the Palliative Medicine Team to assist in the care of this patient.   Total Time 20 minutes Prolonged Time Billed  no       Greater than 50%  of this time was spent counseling and coordinating care related to the above assessment and plan.  Juel Burrow, DNP, [facility] Palliative Medicine Team Team Phone # [phone]  Pager [phone]

## 2020-07-08 NOTE — Progress Notes (Addendum)
PHARMACY - TOTAL PARENTERAL NUTRITION CONSULT NOTE   Indication: Prolonged ileus  Patient Measurements: Height: 5\' 5"  (165.1 cm) Weight: 77.3 kg (170 lb 6.7 oz) IBW/kg (Calculated) : 61.5 TPN AdjBW (KG): 63.5 Body mass index is 28.36 kg/m.  Assessment: 65 year old male with PMHx of CAD, PAD, DM, HLD, MI s/p aortobifemoral bypass, bilateral renal endarterectomies, aortic endarterectomy, and bilateral femoral endarterectomies. Tube feeds were unable to be initiated yesterday. Patient has now been 6 days without nutrition.   Glucose / Insulin: 128 - 265  11 units SSI required levemir 6 units BID Electrolytes: hypokalemia,  Renal: iHD Hepatic:  Intake / Output; MIVF: 1862/2500 (-638 mL) GI Imaging: 2/21: Suspect a degree of bowel ileus. No evident free air on semi-erect image GI Surgeries / Procedures: no recent  Central access: 07/13/2020 TPN start date: 07/07/20  Nutritional Goals (per RD recommendation on 07/07/20): kCal: 1800 - 2000/day, Protein: 95 - 110 grams/day, Fluid: 2L/day  Current Nutrition:  NPO  Plan:   Increase  TPN rate to 80 mL/hr at 1800 (2020 mL total)  Nutritional Components  Amino Acids (Clinisol 15%) 54 g/L: 103.7 grams total  Dextrose: 22%: 422.4 grams total  KCal: 1850.8 total/day   Electrolytes in TPN: Na 32mEq/L, K 65mEq/L, Ca 50mEq/L, Mg 41mEq/L, and Phos 0 mmol/L. Cl:Ac 1:1  Add standard MVI and trace elements to TPN  Continue SSI q4h SSI and adjust as needed   Continue levemir 6 units BID  Repeat 10 mEq IV KCl x 3  Monitor TPN labs on Mon/Thurs, daily until stable  4m 07/08/2020,7:13 AM

## 2020-07-08 NOTE — Code Documentation (Signed)
Patient was noted to be in a bradycardic rhythm, trending down with hypoxia at 55%. Upon entry to the room, patient was unresponsive, pulseless in PEA.  Patient had pulled out HD catheter from L IJ and was actively bleeding. Code Blue team activation at 05:51 CPR initiated- Patient being actively bagged by RT. Epinephrine administered x 3  Intubated by Dr. Vicente Males  ROSC achieved at 05:59  - STAT labs ordered - Daughter notified and is on her way bedside - STAT RBC's ordered  Will notify Dr. Wyn Quaker of event.   Ivan Larsen, AGACNP-BC Acute Care Nurse Practitioner [facility]   [phone] / [phone] Please see Amion for pager details.

## 2020-07-08 NOTE — ED Provider Notes (Deleted)
CODE BLUE note:  Responding to a CODE BLUE called overhead, I arrived to the room to find CPR in progress and patient being bagged with a BVM.  Intubation was performed at bedside using glide scope.  CPR was continued for 2 more rounds with 2 doses of epinephrine before ROSC.  Levophed drip was ordered and titrated to effect.  Patient's fast exam did show pleural effusions as well as a positive area of free fluid in the left upper quadrant of the abdomen.  These results were conveyed to the provider at bedside.  Patient's care was left in this provider's hands with the understanding that a call could be placed at any time if they needed any further help.  Intubation procedure note  The patient required endotracheal intubation. The patient was given no medications for RSI as code was in progress Once the patient was adequately sedated and paralyzed, a Mac 4 laryngoscope was used to directly visualize the cords. Using this direct visualization, a 7.5 endotracheal tube was then passed easily through the cords. This tube was inserted to 21 cm at the lip. There was excellent color change on the end-tidal CO2 monitor. The patient was easily and adequately ventilated. There were excellent breath sounds bilaterally with no breath sounds heard over the epigastrium. The tube was secured in the standard fashion. The patient tolerated this procedure well and there were no complications.   Enora Trillo K, MD 07/08/20 0658 

## 2020-07-08 NOTE — Progress Notes (Signed)
OT Cancellation Note  Patient Details Name: Ivan Larsen MRN: [id number] DOB: 1956/03/09   Cancelled Treatment:    Reason Eval/Treat Not Completed: Medical issues which prohibited therapy   Pt noted with medical complications this morning (pt found unresponsive, pulseless in PEA; CPR initiated; and now intubated).  D/t change/decline in medical status, per therapy protocol, will sign off.  Please re-consult OT when pt is medically appropriate to participate in  Therapy.  Jackquline Denmark, MS, OTR/L , CBIS ascom [phone]  07/08/20, 9:23 AM   07/08/2020, 9:23 AM

## 2020-07-08 NOTE — Progress Notes (Signed)
SLP Cancellation Note  Patient Details Name: Ivan Larsen MRN: [id number] DOB: February 19, 1956   Cancelled treatment:       Reason Eval/Treat Not Completed: Medical issues which prohibited therapy (chart reviewed). Early this morning, pt noted with medical complications: pt found unresponsive, pulseless in PEA; CPR initiated; and now intubated. D/t change/decline in medical status, ST will sign off. Please re-consult TT when pt is medically appropriate to participate in therapy.    Jerilynn Som, MS, CCC-SLP Speech Language Pathologist [facility] [phone] [facility] 07/08/2020, 4:49 PM
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
## 2020-07-08 NOTE — Progress Notes (Signed)
NAME:  Ivan Larsen, MRN:  [id number], DOB:  [birth year], LOS: 7 ADMISSION DATE:  07/08/2020, INITIAL CONSULTATION DATE:  Jul 08, 2020 REFERRING MD:  Dr. Wyn Quaker, CHIEF COMPLAINT:  Hemorrhagic shock   Brief History:  65 yo male with hx of severe peripheral vascular disease, dm, dyslipidemia who underwent elective Aortobifemoral bypass on July 08, 2020. Intraop he lost apx 2L blood with 1/2 returned via blood saver, received IVF fluids.  After surgery patient was in circulatory shock and was emergently brought to MICU due to medical instability. He required vasopressor support perioperatively.        Past Medical History:  Peripheral artery disease Myocardial infarction Hyperlipidemia Kidney stones Elevated liver enzyme Diabetes mellitus Coronary artery disease  Significant Hospital Events:  2/16: Underwent elective aortobifemoral bypass; intraoperative blood loss with hemorrhagic shock: Transferred to ICU post procedure remained intubated 2/17: AKI, nephrology consulted, temporary dialysis catheter placed 07/08/20 CODE BLUE, agitated, pulling lines, hit nurse. Became hypoxic and went PEA, ROSC  Consults:  Vascular surgery Nephrology PCCM   Procedures:  2/16: Aortofemoral bypass 2/16: Right IJ CVC placed 2/17: Left IJ temporary dialysis catheter placed  Significant Diagnostic Tests:  2/17: Renal ultrasound>Question medical renal disease changes of both kidneys. No evidence of renal mass or hydronephrosis.Minimal ascites. 2/18: KUB>>Gas throughout mildly prominent large and small bowel loops may reflect mild ileus. NG tube in the stomach. 2/18: Echocardiogram>>LVEF 50-55% no wall motion abnl.  Micro Data:  2/14: SARS-CoV-2 PCR>> negative 2/16: MRSA PCR>> negative 2/16: Blood culture x2>> 2/18: Tracheal aspirate>>  Antimicrobials:  Cefazolin 2/16>> 2/17 (surgical prophylaxis). Unasyn 2/18>>2/22 Cefepime 2/22--> Flagyl 2/22-->  Interim History / Subjective:  Tolerating  SBT, follows commands   Objective   Blood pressure 135/66, pulse (!) 115, temperature 99.6 F (37.6 C), temperature source Axillary, resp. rate (!) 30, height 5\' 5"  (1.651 m), weight 77.3 kg, SpO2 92 %.    Vent Mode: PRVC FiO2 (%):  [70 %] 70 % Set Rate:  [20 bmp] 20 bmp Vt Set:  [500 mL] 500 mL PEEP:  [5 cmH20] 5 cmH20 Plateau Pressure:  [20 cmH20] 20 cmH20   Intake/Output Summary (Last 24 hours) at 07/08/2020 0859 Last data filed at 07/08/2020 0300 Gross per 24 hour  Intake 1804.18 ml  Output 2100 ml  Net -295.82 ml   Filed Weights   07/05/20 0400 07/07/20 0500 07/08/20 0426  Weight: 80.4 kg 80.4 kg 77.3 kg    Examination: General: Critically ill-appearing male, laying in bed, intubated follows commands, no acute distress HENT: Atraumatic, normocephalic, neck supple, no JVD, ET tube in place Lungs: Clear to auscultation bilaterally, synchronous with the ventilator, even Cardiovascular: Regular rate and rhythm, S1-S2, no murmurs, rubs, gallops Abdomen: Distended, tender, bowel sounds positive x4, midline abdominal honeycomb dressing clean dry and intact Extremities: No deformities, 3+ edema bilateral lower extremities GU: scrotal edema, ecchymotic,foley in place Neuro: Sedated, withdraws from pain, follows commands off sedation, pupils PERRLA     Resolved Hospital Problem list   N/A  Assessment & Plan:  RESPIRATORY: Post-op Respiratory in the setting of Shock and Metabolic Derangements -Intubated but now extubated yet requiring cyclic NIV, and now reintubated post PEA arrest -CXR would appear somewhat improved, although this is with PPV/MV  Questionable pneumonia -CXR appears more of atelectasis -Monitor for  fever, Tm 100.5 07/05/20 -Curve trend WBCs 10.9--->22.3-->20.5 today -Send new procalcitonin (16.3, 07/05/20) -Follow cultures as above ~tracheal aspirate NG from  2/18 -Empiric Unasyn to Cefepime and Flagyl, avoiding Vanc with renal  function  CV: Hemorrhagic shock +/-septic shock Inpatient PEA Arrest with ROSC -Continuous cardiac monitoring -Maintain MAP 60-65 -IV fluids -Blood transfusions as indicated (given blood last night after pulling out HD catheter) -Stress dose steroids, weaning -Continue Midodrine -2D echocardiogram 50-55% LVEF, will repeat limited exam after events of last night -EKG  GI: CT abd/pelvis (no contrast) , ED physician quick FAST U/S was suspicious of free fluid in pelvis Significant diarrhea yesterday, but C. Diff negative Stool Biofire negative Distended loops on KUB Lactic acid recheck <2, do not believe acute gut ischemia is issue, will recheck again after last night Maintain Cefepime and Flagyl Discontinue Vanc enema H2B to BID PPI for coffee ground emesis Perhaps will need TPN, nutrition will confer with primary service on this point  HEME: Acute Blood Loss Anemia -Monitor for S/Sx of bleeding -Trend CBC (H&H q6h) -SCD's for VTE Prophylaxis (can start chemical prophylaxis when cleared by surgery) -Transfuse for Hgb <8   RENAL: Lab Results  Component Value Date   CREATININE 4.56 (H) 07/08/2020   BUN 96 (H) 07/08/2020   NA 151 (H) 07/08/2020   K 3.2 (L) 07/08/2020   CL 106 07/08/2020   CO2 25 07/08/2020    Intake/Output Summary (Last 24 hours) at 07/08/2020 0859 Last data filed at 07/08/2020 0300 Gross per 24 hour  Intake 1804.18 ml  Output 2100 ml  Net -295.82 ml   Net IO Since Admission: 10,547.55 mL [07/08/20 0859]  AKI -assess anion gap metabolic acidosis (bicarb was given in code) send lactic acid and B-HBA -Monitor I&O's / urinary output -Trend renal indices, improved today, but with PEA arrest I suspect will trend up again -Ensure adequate renal perfusion -Avoid nephrotoxic agents as able -Replace electrolytes as indicated -Nephrology consultation appreciated -HD per consultant   Severe Peripheral Vascular Disease s/p Aortobifemoral bypass -Vascular  Surgery following, will follow recommendations -Pain control   Hyperglycemia CBG's SSI Follow ICU Hypo/Hyperglycemia protocol   Best practice (evaluated daily)  Diet: NPO Pain/Anxiety/Delirium protocol (if indicated): Fentanyl/Precedex VAP protocol (if indicated): yes, implemented DVT prophylaxis: SCD's GI prophylaxis: Pepcid Glucose control: SSI, Levemir Mobility: Bedrest Disposition:ICU  Goals of Care:  Last date of multidisciplinary goals of care discussion:07/03/2020 Family and staff present: Bedside RN and pts daughter at bedside Summary of discussion: Plan for SBT today, wean vasopressors as able, continue ABX for potential PNA Follow up goals of care discussion due: 07/04/2020 Code Status: FULL CODE  Labs   CBC: Recent Labs  Lab 07/04/20 0419 07/04/20 1053 07/05/20 0443 07/06/20 0434 07/07/20 0431 07/08/20 0613  WBC 25.2*  --  11.2* 10.9* 22.3* 20.5*  NEUTROABS  --   --   --   --   --  15.6*  HGB 8.6* 8.3* 7.9* 7.0* 9.4* 7.9*  HCT 26.6* 25.0* 24.0* 21.0* 27.1* 24.3*  MCV 82.9  --  82.8 81.7 80.7 85.3  PLT 120*  --  112* 108* 118* 127*    Basic Metabolic Panel: Recent Labs  Lab 07/04/20 0419 07/05/20 0443 07/06/20 0434 07/07/20 0431 07/08/20 0613  NA 147* 144 144 146* 151*  K 4.4 4.2 3.7 3.1* 3.2*  CL 101 99 100 102 106  CO2 32 27 28 29 25   GLUCOSE 112* 115* 145* 157* 332*  BUN 63* 79* 73* 88* 96*  CREATININE 4.25* 5.20* 4.68* 4.83* 4.56*  CALCIUM 6.0* 6.2* 6.8* 6.8* 6.6*  MG 2.1 2.3 2.2 2.1 2.6*  PHOS 6.0* 7.8* 6.6* 6.8* 8.3*   GFR: Estimated Creatinine Clearance: 15.5 mL/min (A) (by  C-G formula based on SCr of 4.56 mg/dL (H)). Recent Labs  Lab 07/21/20 2301 07/02/20 0145 07/02/20 0346 07/02/20 0502 07/03/20 0405 07/04/20 0419 07/05/20 0443 07/06/20 0434 07/06/20 1938 07/07/20 0431 07/08/20 0613  PROCALCITON  --   --  1.30  --  3.68 8.85 16.33  --   --   --   --   WBC  --    < >  --   --  20.9* 25.2* 11.2* 10.9*  --  22.3* 20.5*   LATICACIDVEN >11.0*  --   --  >11.0*  --   --   --   --  2.3* 1.9  --    < > = values in this interval not displayed.    Liver Function Tests: Recent Labs  Lab 07/03/20 0405 07/04/20 0419 07/05/20 0443 07/06/20 0434 07/08/20 0613  AST 1,152* 491* 386* 262* 182*  ALT 581* 222* 125* 70* 45*  ALKPHOS 66 87 122 131* 171*  BILITOT 0.6 0.6 1.4* 1.6* 1.9*  PROT 3.8* 3.9* 4.5* 4.5* 4.6*  ALBUMIN 2.2* 1.9* 2.0* 2.1* 1.7*   No results for input(s): LIPASE, AMYLASE in the last 168 hours. No results for input(s): AMMONIA in the last 168 hours.  ABG    Component Value Date/Time   PHART 7.56 (H) 07/08/2020 0851   PCO2ART 38 07/08/2020 0851   PO2ART 61 (L) 07/08/2020 0851   HCO3 34.0 (H) 07/08/2020 0851   ACIDBASEDEF 14.7 (H) 07/02/2020 0555   O2SAT 94.2 07/08/2020 0851     Coagulation Profile: Recent Labs  Lab 07/08/20 0613  INR 1.5*    Cardiac Enzymes: Recent Labs  Lab 07/02/20 0346 07/02/20 0753  CKTOTAL 114 178    HbA1C: Hemoglobin A1C  Date/Time Value Ref Range Status  12/22/2015 12:00 AM 7.3  Final   HB A1C (BAYER DCA - WAIVED)  Date/Time Value Ref Range Status  06/02/2020 01:44 PM 6.3 <7.0 % Final    Comment:                                          Diabetic Adult            <7.0                                       Healthy Adult        4.3 - 5.7                                                           (DCCT/NGSP) American Diabetes Association's Summary of Glycemic Recommendations for Adults with Diabetes: Hemoglobin A1c <7.0%. More stringent glycemic goals (A1c <6.0%) may further reduce complications at the cost of increased risk of hypoglycemia.   02/07/2020 08:07 AM 6.4 <7.0 % Final    Comment:                                          Diabetic Adult            <7.0  Healthy Adult        4.3 - 5.7                                                           (DCCT/NGSP) American Diabetes Association's Summary of  Glycemic Recommendations for Adults with Diabetes: Hemoglobin A1c <7.0%. More stringent glycemic goals (A1c <6.0%) may further reduce complications at the cost of increased risk of hypoglycemia.    Hgb A1c MFr Bld  Date/Time Value Ref Range Status  07/03/2020 04:05 AM 5.7 (H) 4.8 - 5.6 % Final    Comment:    (NOTE)         Prediabetes: 5.7 - 6.4         Diabetes: >6.4         Glycemic control for adults with diabetes: <7.0   07/02/2020 03:46 AM 5.7 (H) 4.8 - 5.6 % Final    Comment:    (NOTE)         Prediabetes: 5.7 - 6.4         Diabetes: >6.4         Glycemic control for adults with diabetes: <7.0     CBG: Recent Labs  Lab 07/07/20 1502 07/07/20 1938 07/07/20 2338 07/08/20 0401 07/08/20 0729  GLUCAP 128* 157* 208* 259* 265*    Review of Systems:   Unable to assess due to intubation and sedation  Past Medical History:  He,  has a past medical history of CAD (coronary artery disease), Diabetes mellitus without complication (HCC), Elevated liver enzymes, History of kidney stones, Hyperlipidemia, MI (myocardial infarction) (HCC) (07/01/12), and PAD (peripheral artery disease) (HCC).   Surgical History:   Past Surgical History:  Procedure Laterality Date  . AORTA - BILATERAL FEMORAL ARTERY BYPASS GRAFT N/A 06/25/2020   Procedure: AORTA BIFEMORAL BYPASS GRAFT (VERSUS BILATERAL FEMORAL ENDARTERECTOMIES);  Surgeon: Annice Needyew, Jason S, MD;  Location: [facility] ORS;  Service: Vascular;  Laterality: N/A;  . CORONARY STENT PLACEMENT    . EYE SURGERY    . LOWER EXTREMITY ANGIOGRAPHY Left 06/25/2020   Procedure: LOWER EXTREMITY ANGIOGRAPHY;  Surgeon: Annice Needyew, Jason S, MD;  Location: [facility] INVASIVE CV LAB;  Service: Cardiovascular;  Laterality: Left;     Social History:   reports that he quit smoking about 16 years ago. His smoking use included cigarettes. He has never used smokeless tobacco. He reports current alcohol use. He reports that he does not use drugs.   Family History:  His family  history includes Diabetes in his mother; Heart attack in his father.   Allergies Allergies  Allergen Reactions  . Iodinated Diagnostic Agents Nausea Only    Other reaction(s): Vomiting  . Iodine   . Shellfish Allergy Nausea And Vomiting and Other (See Comments)    Sweating  Sweating    . Shellfish-Derived Products Nausea And Vomiting and Other (See Comments)    Other reaction(s): Nausea And Vomiting, Other (See Comments) Sweating  Sweating      Home Medications  Prior to Admission medications   Medication Sig Start Date End Date Taking? Authorizing Provider  amLODipine (NORVASC) 10 MG tablet Take 1 tablet (10 mg total) by mouth daily. 02/17/20  Yes Bacigalupo, Marzella SchleinAngela M, MD  atorvastatin (LIPITOR) 40 MG tablet Take 1 tablet (40 mg total) by mouth daily. 02/17/20  Yes Bacigalupo,  Marzella Schlein, MD  benazepril (LOTENSIN) 40 MG tablet Take 1 tablet (40 mg total) by mouth daily. 02/17/20  Yes Bacigalupo, Marzella Schlein, MD  collagenase (SANTYL) ointment Apply 1 application topically daily. 05/05/20  Yes Cannady, Corrie Dandy T, NP  empagliflozin (JARDIANCE) 25 MG TABS tablet Take 1 tablet (25 mg total) by mouth daily before breakfast. 03/20/20  Yes Cathlean Marseilles A, NP  gabapentin (NEURONTIN) 300 MG capsule Take 1 capsule (300 mg total) by mouth 3 (three) times daily. 05/05/20  Yes Cannady, Jolene T, NP  hydrochlorothiazide (HYDRODIURIL) 25 MG tablet Take 1 tablet (25 mg total) by mouth daily. 03/20/20  Yes Valentino Nose, NP  HYDROcodone-acetaminophen (NORCO) 5-325 MG tablet Take 1 tablet by mouth every 6 (six) hours as needed for moderate pain. 06/25/20  Yes Dew, Marlow Baars, MD  meclizine (ANTIVERT) 25 MG tablet Take 1 tablet (25 mg total) by mouth 3 (three) times daily as needed for dizziness or nausea. 08/07/19  Yes Particia Nearing, PA-C  metoprolol tartrate (LOPRESSOR) 50 MG tablet Take 1 tablet (50 mg total) by mouth 2 (two) times daily. 02/13/20  Yes Valentino Nose, NP  mupirocin ointment  (BACTROBAN) 2 % Apply 1 application topically 2 (two) times daily. 06/02/20  Yes Cannady, Jolene T, NP  Omega-3 Fatty Acids (FISH OIL OMEGA-3) 1000 MG CAPS Take by mouth 2 (two) times daily.   Yes [provider]  ondansetron (ZOFRAN-ODT) 4 MG disintegrating tablet 1 TABLET ORALLY DISSOLVED EVERY 8 HOURS IF NEEDED FOR NAUSEA AND VOMITING Patient taking differently: Take 4 mg by mouth every 8 (eight) hours as needed for nausea or vomiting. 12/27/19  Yes Cathlean Marseilles A, NP  sitaGLIPtin (JANUVIA) 25 MG tablet Take 1 tablet (25 mg total) by mouth daily. 03/20/20  Yes Valentino Nose, NP  tamsulosin (FLOMAX) 0.4 MG CAPS capsule Take 1 capsule (0.4 mg total) by mouth at bedtime. Take at nighttime to minimize dizziness/hypotension 03/20/20  Yes Cathlean Marseilles A, NP  clopidogrel (PLAVIX) 75 MG tablet Take 1 tablet (75 mg total) by mouth daily. Patient not taking: No sig reported 02/13/20   Valentino Nose, NP  doxycycline (VIBRA-TABS) 100 MG tablet Take 1 tablet (100 mg total) by mouth 2 (two) times daily. Patient not taking: No sig reported 06/02/20   Aura Dials T, NP  metFORMIN (GLUCOPHAGE) 500 MG tablet Take 2 tablets (1,000 mg total) by mouth 2 (two) times daily with a meal. 03/20/20   Valentino Nose, NP     Critical care time: 40 minutes    The patient is critically ill with multiple organ systems failure and requires high complexity decision making for assessment and support, frequent evaluation and titration of therapies, application of advanced monitoring technologies and extensive interpretation of multiple databases. Critical Care Time devoted to patient care services described in this note is 40 minutes.   *This note was dictated using voice recognition software/Dragon.  Despite best efforts to proofread, errors can occur which can change the meaning.  Any change was purely unintentional.

## 2020-07-09 DIAGNOSIS — E870 Hyperosmolality and hypernatremia: Secondary | ICD-10-CM | POA: Diagnosis not present

## 2020-07-09 DIAGNOSIS — D62 Acute posthemorrhagic anemia: Secondary | ICD-10-CM | POA: Diagnosis not present

## 2020-07-09 DIAGNOSIS — I469 Cardiac arrest, cause unspecified: Secondary | ICD-10-CM

## 2020-07-09 DIAGNOSIS — N179 Acute kidney failure, unspecified: Secondary | ICD-10-CM | POA: Diagnosis not present

## 2020-07-09 LAB — CBC
HCT: 21.5 % — ABNORMAL LOW (ref 39.0–52.0)
Hemoglobin: 7.6 g/dL — ABNORMAL LOW (ref 13.0–17.0)
MCH: 29.3 pg (ref 26.0–34.0)
MCHC: 35.3 g/dL (ref 30.0–36.0)
MCV: 83 fL (ref 80.0–100.0)
Platelets: 115 K/uL — ABNORMAL LOW (ref 150–400)
RBC: 2.59 MIL/uL — ABNORMAL LOW (ref 4.22–5.81)
RDW: 21.4 % — ABNORMAL HIGH (ref 11.5–15.5)
WBC: 22.2 K/uL — ABNORMAL HIGH (ref 4.0–10.5)
nRBC: 5 % — ABNORMAL HIGH (ref 0.0–0.2)

## 2020-07-09 LAB — LACTIC ACID, PLASMA
Lactic Acid, Venous: 2.6 mmol/L (ref 0.5–1.9)
Lactic Acid, Venous: 2.3 mmol/L (ref 0.5–1.9)

## 2020-07-09 LAB — BASIC METABOLIC PANEL WITH GFR
Anion gap: 13 (ref 5–15)
BUN: 95 mg/dL — ABNORMAL HIGH (ref 8–23)
CO2: 32 mmol/L (ref 22–32)
Calcium: 7 mg/dL — ABNORMAL LOW (ref 8.9–10.3)
Chloride: 112 mmol/L — ABNORMAL HIGH (ref 98–111)
Creatinine, Ser: 3.69 mg/dL — ABNORMAL HIGH (ref 0.61–1.24)
GFR, Estimated: 17 mL/min — ABNORMAL LOW
Glucose, Bld: 333 mg/dL — ABNORMAL HIGH (ref 70–99)
Potassium: 2.8 mmol/L — ABNORMAL LOW (ref 3.5–5.1)
Sodium: 157 mmol/L — ABNORMAL HIGH (ref 135–145)

## 2020-07-09 LAB — GLUCOSE, CAPILLARY
Glucose-Capillary: 225 mg/dL — ABNORMAL HIGH (ref 70–99)
Glucose-Capillary: 244 mg/dL — ABNORMAL HIGH (ref 70–99)
Glucose-Capillary: 307 mg/dL — ABNORMAL HIGH (ref 70–99)
Glucose-Capillary: 324 mg/dL — ABNORMAL HIGH (ref 70–99)
Glucose-Capillary: 342 mg/dL — ABNORMAL HIGH (ref 70–99)
Glucose-Capillary: 346 mg/dL — ABNORMAL HIGH (ref 70–99)
Glucose-Capillary: 357 mg/dL — ABNORMAL HIGH (ref 70–99)
Glucose-Capillary: 381 mg/dL — ABNORMAL HIGH (ref 70–99)

## 2020-07-09 LAB — POTASSIUM
Potassium: 2.9 mmol/L — ABNORMAL LOW (ref 3.5–5.1)
Potassium: 3.3 mmol/L — ABNORMAL LOW (ref 3.5–5.1)

## 2020-07-09 LAB — PHOSPHORUS
Phosphorus: 2.6 mg/dL (ref 2.5–4.6)
Phosphorus: 1.4 mg/dL — ABNORMAL LOW (ref 2.5–4.6)

## 2020-07-09 LAB — TROPONIN I (HIGH SENSITIVITY)
Troponin I (High Sensitivity): 1264 ng/L (ref <18)
Troponin I (High Sensitivity): 1192 ng/L (ref <18)

## 2020-07-09 LAB — MAGNESIUM
Magnesium: 2.4 mg/dL (ref 1.7–2.4)
Magnesium: 2.1 mg/dL (ref 1.7–2.4)

## 2020-07-09 LAB — TRIGLYCERIDES: Triglycerides: 67 mg/dL

## 2020-07-09 MED ORDER — INSULIN DETEMIR 100 UNIT/ML ~~LOC~~ SOLN
10.0000 [IU] | Freq: Two times a day (BID) | SUBCUTANEOUS | Status: DC
  Administered 2020-07-09: 10 [IU] via SUBCUTANEOUS
  Filled 2020-07-09 (×2): qty 0.1

## 2020-07-09 MED ORDER — POTASSIUM PHOSPHATES 15 MMOLE/5ML IV SOLN
45.0000 mmol | Freq: Once | INTRAVENOUS | Status: AC
  Administered 2020-07-09: 45 mmol via INTRAVENOUS
  Filled 2020-07-09: qty 15

## 2020-07-09 MED ORDER — FREE WATER
200.0000 mL | Status: DC
  Administered 2020-07-09 – 2020-07-10 (×9): 200 mL

## 2020-07-09 MED ORDER — TRACE MINERALS CU-MN-SE-ZN 300-55-60-3000 MCG/ML IV SOLN
INTRAVENOUS | Status: AC
  Filled 2020-07-09: qty 691.2

## 2020-07-09 MED ORDER — POTASSIUM CHLORIDE 10 MEQ/100ML IV SOLN
10.0000 meq | INTRAVENOUS | Status: AC
  Administered 2020-07-09 (×3): 10 meq via INTRAVENOUS
  Filled 2020-07-09 (×3): qty 100

## 2020-07-09 MED ORDER — POTASSIUM CHLORIDE 10 MEQ/100ML IV SOLN
10.0000 meq | INTRAVENOUS | Status: AC
  Administered 2020-07-09 (×4): 10 meq via INTRAVENOUS
  Filled 2020-07-09 (×4): qty 100

## 2020-07-09 MED ORDER — INSULIN DETEMIR 100 UNIT/ML ~~LOC~~ SOLN
15.0000 [IU] | Freq: Two times a day (BID) | SUBCUTANEOUS | Status: DC
  Administered 2020-07-09: 15 [IU] via SUBCUTANEOUS
  Filled 2020-07-09 (×3): qty 0.15

## 2020-07-09 MED ORDER — INSULIN ASPART 100 UNIT/ML ~~LOC~~ SOLN
3.0000 [IU] | SUBCUTANEOUS | Status: DC
  Administered 2020-07-09 – 2020-07-10 (×3): 3 [IU] via SUBCUTANEOUS
  Filled 2020-07-09 (×3): qty 1

## 2020-07-09 MED ORDER — INSULIN DETEMIR 100 UNIT/ML ~~LOC~~ SOLN
5.0000 [IU] | Freq: Once | SUBCUTANEOUS | Status: AC
  Administered 2020-07-09: 5 [IU] via SUBCUTANEOUS
  Filled 2020-07-09: qty 0.05

## 2020-07-09 MED ORDER — SODIUM CHLORIDE 0.45 % IV SOLN: INTRAVENOUS | Status: DC

## 2020-07-09 MED FILL — Medication: Qty: 1 | Status: AC

## 2020-07-09 NOTE — Progress Notes (Signed)
[facility]  Daily Progress Note   Subjective  -   No major events overnight.  Had code event yesterday morning, but has done well since being intubated.  Sedated on the ventilator.  Ventilator support has been decreased.  Off of pressors.  Objective Vitals:   07/08/20 1957 07/09/20 0323 07/09/20 0410 07/09/20 0800  BP:      Pulse:      Resp:      Temp:      TempSrc:    Esophageal  SpO2: 96% 97%    Weight:   78 kg   Height:        Intake/Output Summary (Last 24 hours) at 07/09/2020 1232 Last data filed at 07/09/2020 0300 Gross per 24 hour  Intake 2319.8 ml  Output 1725 ml  Net 594.8 ml    PULM  somewhat diminished bilaterally particularly in the bases CV  RRR VASC  feet are warm.  Incisions are clean, dry, and intact.  Laboratory CBC    Component Value Date/Time   WBC 22.2 (H) 07/09/2020 0425   HGB 7.6 (L) 07/09/2020 0425   HGB 14.7 06/04/2020 1441   HCT 21.5 (L) 07/09/2020 0425   HCT 45.6 06/04/2020 1441   PLT 115 (L) 07/09/2020 0425   PLT 320 06/04/2020 1441    BMET    Component Value Date/Time   NA 157 (H) 07/09/2020 0425   NA 140 06/02/2020 1347   NA 141 07/03/2012 0511   K 2.8 (L) 07/09/2020 0425   K 4.0 07/03/2012 0511   CL 112 (H) 07/09/2020 0425   CL 108 (H) 07/03/2012 0511   CO2 32 07/09/2020 0425   CO2 24 07/03/2012 0511   GLUCOSE 333 (H) 07/09/2020 0425   GLUCOSE 185 (H) 07/03/2012 0511   BUN 95 (H) 07/09/2020 0425   BUN 24 06/02/2020 1347   BUN 14 07/03/2012 0511   CREATININE 3.69 (H) 07/09/2020 0425   CREATININE 0.86 07/03/2012 0511   CALCIUM 7.0 (L) 07/09/2020 0425   CALCIUM 8.1 (L) 07/03/2012 0511   GFRNONAA 17 (L) 07/09/2020 0425   GFRNONAA >60 07/03/2012 0511   GFRAA 76 06/02/2020 1347   GFRAA >60 07/03/2012 0511    Assessment/Planning: POD #8 s/p aortobifemoral bypass, renal artery endarterectomy, aortic and femoral endarterectomies   Seems to stabilized after code event yesterday morning.  Now on  relatively low ventilator support but requiring sedation  Off pressors  Incisions are clean, dry, and intact.  Patient is less edematous now so not having serous fluid leakage.  Feet are warm.  Renal function has improved over the past 24 hours even with code event yesterday morning.  Getting TPN for nutrition, and if NG tube output decreases can consider starting enteral feeds at any point now.  Still quite weak and requiring significant support  If he fails extubation, may have to consider tracheostomy next week.      Festus Barren  07/09/2020, 12:32 PM

## 2020-07-09 NOTE — Consult Note (Signed)
CODE BLUE note:  Responding to a CODE BLUE called overhead, I arrived to the room to find CPR in progress and patient being bagged with a BVM.  Intubation was performed at bedside using glide scope.  CPR was continued for 2 more rounds with 2 doses of epinephrine before ROSC.  Levophed drip was ordered and titrated to effect.  Patient's fast exam did show pleural effusions as well as a positive area of free fluid in the left upper quadrant of the abdomen.  These results were conveyed to the provider at bedside.  Patient's care was left in this provider's hands with the understanding that a call could be placed at any time if they needed any further help.  Intubation procedure note  The patient required endotracheal intubation. The patient was given no medications for RSI as code was in progress Once the patient was adequately sedated and paralyzed, a Mac 4 laryngoscope was used to directly visualize the cords. Using this direct visualization, a 7.5 endotracheal tube was then passed easily through the cords. This tube was inserted to 21 cm at the lip. There was excellent color change on the end-tidal CO2 monitor. The patient was easily and adequately ventilated. There were excellent breath sounds bilaterally with no breath sounds heard over the epigastrium. The tube was secured in the standard fashion. The patient tolerated this procedure well and there were no complications.   Naaman Plummer, MD 07/08/20 [phone]

## 2020-07-09 NOTE — Progress Notes (Signed)
Inpatient Diabetes Program Recommendations  AACE/ADA: New Consensus Statement on Inpatient Glycemic Control (2015)  Target Ranges:  Prepandial:   less than 140 mg/dL      Peak postprandial:   less than 180 mg/dL (1-2 hours)      Critically ill patients:  140 - 180 mg/dL   Lab Results  Component Value Date   GLUCAP 225 (H) 07/09/2020   HGBA1C 5.7 (H) 07/03/2020    Review of Glycemic Control Results for Ivan Larsen, Ivan Larsen (MRN [id number]) as of 07/09/2020 10:30  Ref. Range 07/08/2020 23:19 07/09/2020 00:17 07/09/2020 02:04 07/09/2020 03:06 07/09/2020 07:56  Glucose-Capillary Latest Ref Range: 70 - 99 mg/dL 536 (H) 144 (H) 315 (H) 342 (H) 225 (H)   Diabetes history: DM 2 Outpatient Diabetes medications:  Jardiance 25 mg daily Metformin 1000 mg bid Januvia 25 mg daily Current orders for Inpatient glycemic control:  Levemir 10 units q 12 hours Novolog resistant q 4 hours Solucortef 50 mg IV q 8 hours Inpatient Diabetes Program Recommendations:   If CBG's >200 mg/dL, consider IV insulin.   Thanks,  Beryl Meager, RN, BC-ADM Inpatient Diabetes Coordinator Pager [phone] (8a-5p)
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
## 2020-07-09 NOTE — Progress Notes (Addendum)
PHARMACY - TOTAL PARENTERAL NUTRITION CONSULT NOTE   Indication: Prolonged ileus  Patient Measurements: Height: 5\' 5"  (165.1 cm) Weight: 78 kg (171 lb 15.3 oz) IBW/kg (Calculated) : 61.5 TPN AdjBW (KG): 63.5 Body mass index is 28.62 kg/m.  Assessment: 65 year old male with PMHx of CAD, PAD, DM, HLD, MI s/p aortobifemoral bypass, bilateral renal endarterectomies, aortic endarterectomy, and bilateral femoral endarterectomies. Tube feeds were unable to be initiated yesterday. Patient was 6 days without nutrition prior to TPN initiation.   Glucose / Insulin: 290 - 381  49 units SSI required Levemir 6  units q 12 hours Novolog resistant q 4 hours Solucortef 50 mg IV q 8 hours Electrolytes: hypokalemia  Renal: iHD Hepatic:  Intake / Output; MIVF: 3734/2900 (+ 834 mL) GI Imaging: 2/21: Suspect a degree of bowel ileus. No evident free air on semi-erect image GI Surgeries / Procedures: no recent  Central access: 2020/07/17 TPN start date: 07/07/20  Nutritional Goals (per RD recommendation on 07/07/20): kCal: 1800 - 2000/day, Protein: 95 - 110 grams/day, Fluid: 2L/day  Current Nutrition:  NPO  Plan:   continue  TPN at 80 mL/hr at 1800 (2020 mL total)  Nutritional Components  Amino Acids (Clinisol 15%) 54 g/L: 103.7 grams total  Dextrose: 22%: 422.4 grams total  KCal: 1850.8 total/day   Electrolytes in TPN: Na 68mEq/L, K 78mEq/L, Ca 75mEq/L, Mg 40mEq/L, and Phos 0 mmol/L. Cl:Ac 1:1  Add standard MVI and trace elements to TPN  Continue q4h SSI and adjust as needed   increase levemir to 15 units BID  free water 200 mL per tube q4h (per Dr 4m)  Repeat 10 mEq IV KCl x 3  0.45% NaCl infusion at 50 mL/hr (per Dr Earlie Server)  Monitor TPN labs on Mon/Thurs, daily until stable  Addendum: a follow-up potassium level was drawn following the previous 30 mEq IV potassium  Will add an additional 10 mEq IV KCl x 4  Earlie Server 07/09/2020,7:21 AM

## 2020-07-09 NOTE — Progress Notes (Signed)
Nutrition Follow-up  DOCUMENTATION CODES:   Not applicable  INTERVENTION:   Continue TPN per pharmacy   If tube feeds initiated, recommend:   Vital 1.5 @25ml /hr- Once pt tolerating, increase by 75ml/hr q 12 hours until goal rate of 57ml/hr is reached  Pro-Source 57ml TID via tube, provides 40kcal and 11g of protein per serving   Free water flushes 56ml q4 hours per MD  Regimen provides 1740kcal/day, 106g/day protein and 206ml/day free water   Recommend Juven Fruit Punch BID, each serving provides 95kcal and 2.5g of protein (amino acids glutamine and arginine)  NUTRITION DIAGNOSIS:   Inadequate oral intake related to inability to eat as evidenced by NPO status.  Ongoing inadequate oral intake - diet is clear liquids but patient unable to safely take in PO at this time.  GOAL:   Patient will meet greater than or equal to 90% of their needs  Progressing with initiation of tube feeds.  MONITOR:   Vent status,Labs,Weight trends,I & O's,Skin  REASON FOR ASSESSMENT:   Ventilator    ASSESSMENT:   65 year old male with PMHx of CAD, PAD, DM, HLD, MI s/p aortobifemoral bypass, bilateral renal endarterectomies, aortic endarterectomy, and bilateral femoral endarterectomies on 2/16, intraoperatively lost approximately 2 liters of blood with 900 mL returned via blood saver and developed circulatory shock.  Pt s/p PEA arrest and re-intubation 2/23. Pt sedated and ventilated. OGT in place and clamped. Plan is for possible extubation today vs tracheostomy next week. Pt tolerating TPN at goal rate. Recommend trickle feeds once ileus resolved.   Per chart, pt is up ~31lbs from his UBW. Pt +11L on his I & O's  Medications reviewed and include: aspirin, colace, insulin, protonix, Na bicarbonate, zosyn  Labs reviewed: Na 157(H), K 2.9(L), BUN 95(H), creat 3.69(H), P 2.6 wnl, Mg 2.4 wnl Triglycerides 67 Wbc- 22.2(H), Hgb 7.6(L), Hct 21.5(L) cbgs- 381, 357, 342, 225, 244 x 24  hrs  Patient is currently intubated on ventilator support MV: 10.1 L/min Temp (24hrs), Avg:99 F (37.2 C), Min:98.42 F (36.9 C), Max:99.68 F (37.6 C)  Propofol: none   MAP- >59mmHg  UOP- 77m   Diet Order:   Diet Order            Diet NPO time specified Except for: Ice Chips  Diet effective now                EDUCATION NEEDS:   No education needs have been identified at this time  Skin:  Skin Assessment: Skin Integrity Issues: Skin Integrity Issues:: DTI,Unstageable,Incisions DTI: right buttocks Unstageable: left heel Incisions: closed incisions to groin and abdomen  Last BM:  2/24- 3/24  Height:   Ht Readings from Last 1 Encounters:  07/04/2020 5\' 5"  (1.651 m)   Weight:   Wt Readings from Last 1 Encounters:  07/09/20 78 kg   BMI:  Body mass index is 28.62 kg/m.  Estimated Nutritional Needs:   Kcal:  1800-2000  Protein:  95-110 grams  Fluid:  2 L/day  MS, RD, LDN Please refer to [facility] for RD and/or RD on-call/weekend/after hours pager

## 2020-07-09 NOTE — TOC Progression Note (Signed)
Transition of Care [facility]) - Progression Note    Patient Details  Name: Ivan Larsen MRN: [id number] Date of Birth: [birth year]  Transition of Care [facility]) CM/SW Contact  [name] Phone Number:  [phone] 07/09/2020, 4:32 PM  Clinical Narrative:     Patient remains intubated, vent at 32%, possible wake trial today.  Daughter [name] [phone] coming to visit today.  Expected Discharge Plan: Skilled Nursing Facility Barriers to Discharge: SNF Pending bed offer,Continued Medical Work up  Expected Discharge Plan and Services Expected Discharge Plan: Skilled Nursing Facility In-house Referral: Clinical Social Work     Living arrangements for the past 2 months: Single Family Home                                       Social Determinants of Health (SDOH) Interventions    Readmission Risk Interventions No flowsheet data found.

## 2020-07-09 NOTE — Progress Notes (Signed)
[facility]  ROUNDING NOTE   Subjective:   2/19- patient extubated 2/20- 1st HD 2/21-iv lasix- good response 2/23 patient is now reintubated  Gen- NAD Neuro-sedated on the vent Pulm-vent, fio2 35%  cv-  Gi-Now on TPN;80/ hr Renal-UOP improved compared to yesterday    Objective:   Vitals:   07/09/20 1200 07/09/20 1300  BP: (!) 157/47 (!) 118/49  Pulse: 77 (!) 48  Resp: 18 20  Temp: 98.42 F (36.9 C) 98.42 F (36.9 C)  SpO2: 97% 95%     Weight change: 0.7 kg Filed Weights   07/07/20 0500 07/08/20 0426 07/09/20 0410  Weight: 80.4 kg 77.3 kg 78 kg    Intake/Output: I/O last 3 completed shifts: In: 4267.1 [I.V.:2784.1; Blood:350; NG/GT:120; IV Piggyback:1013] Out: 4383 [Urine:3450; Stool:400]   Intake/Output this shift:  Total I/O In: -  Out: 1050 [Urine:1050]  Physical Exam: General: Critically ill  HEENT: NGT in place, mouth is dry  Lungs:  [place] O2, mild coarse breath sounds, vent assisted  Heart: Regular rate and rhythm  Abdomen:  +distended, dressings clean, decreased sounds  Extremities: 2+peripheral edema over thighs. Scrotal edema  Neurologic: Sedated on the vent  Skin: No lesions  Access: None at this time  Foley in place  Basic Metabolic Panel: Recent Labs  Lab 07/05/20 0443 07/06/20 0434 07/07/20 0431 07/08/20 0613 07/09/20 0425 07/09/20 1250  NA 144 144 146* 151* 157*  --   K 4.2 3.7 3.1* 3.2* 2.8* 2.9*  CL 99 100 102 106 112*  --   CO2 27 28 29 25  32  --   GLUCOSE 115* 145* 157* 332* 333*  --   BUN 79* 73* 88* 96* 95*  --   CREATININE 5.20* 4.68* 4.83* 4.56* 3.69*  --   CALCIUM 6.2* 6.8* 6.8* 6.6* 7.0*  --   MG 2.3 2.2 2.1 2.6* 2.4  --   PHOS 7.8* 6.6* 6.8* 8.3* 2.6  --     Liver Function Tests: Recent Labs  Lab 07/03/20 0405 07/04/20 0419 07/05/20 0443 07/06/20 0434 07/08/20 0613  AST 1,152* 491* 386* 262* 182*  ALT 581* 222* 125* 70* 45*  ALKPHOS 66 87 122 131* 171*  BILITOT 0.6 0.6 1.4* 1.6* 1.9*  PROT  3.8* 3.9* 4.5* 4.5* 4.6*  ALBUMIN 2.2* 1.9* 2.0* 2.1* 1.7*   No results for input(s): LIPASE, AMYLASE in the last 168 hours. No results for input(s): AMMONIA in the last 168 hours.  CBC: Recent Labs  Lab 07/05/20 0443 07/06/20 0434 07/07/20 0431 07/08/20 0613 07/09/20 0425  WBC 11.2* 10.9* 22.3* 20.5* 22.2*  NEUTROABS  --   --   --  15.6*  --   HGB 7.9* 7.0* 9.4* 7.9* 7.6*  HCT 24.0* 21.0* 27.1* 24.3* 21.5*  MCV 82.8 81.7 80.7 85.3 83.0  PLT 112* 108* 118* 127* 115*    Cardiac Enzymes: No results for input(s): CKTOTAL, CKMB, CKMBINDEX, TROPONINI in the last 168 hours.  BNP: Invalid input(s): POCBNP  CBG: Recent Labs  Lab 07/09/20 0017 07/09/20 0204 07/09/20 0306 07/09/20 0756 07/09/20 1153  GLUCAP 381* 357* 342* 225* 45*    Microbiology: Results for orders placed or performed during the hospital encounter of 07/03/2020  MRSA PCR Screening     Status: None   Collection Time: 07/13/2020  4:32 PM   Specimen: Nasopharyngeal  Result Value Ref Range Status   MRSA by PCR NEGATIVE NEGATIVE Final    Comment:        The GeneXpert MRSA Assay (  FDA approved for NASAL specimens only), is one component of a comprehensive MRSA colonization surveillance program. It is not intended to diagnose MRSA infection nor to guide or monitor treatment for MRSA infections. Performed at [facility], [address] [place], [place] [address]   CULTURE, BLOOD (ROUTINE X 2) w Reflex to ID Panel     Status: None   Collection Time: 06/30/2020  6:07 PM   Specimen: BLOOD  Result Value Ref Range Status   Specimen Description BLOOD BLOOD RIGHT HAND  Final   Special Requests   Final    BOTTLES DRAWN AEROBIC AND ANAEROBIC Blood Culture results may not be optimal due to an inadequate volume of blood received in culture bottles   Culture   Final    NO GROWTH 5 DAYS Performed at [facility], [address] [place], [place] [address]    Report Status 07/06/2020 FINAL  Final   CULTURE, BLOOD (ROUTINE X 2) w Reflex to ID Panel     Status: None   Collection Time: 06/19/2020  6:07 PM   Specimen: BLOOD  Result Value Ref Range Status   Specimen Description BLOOD LEFT THUMB  Final   Special Requests   Final    BOTTLES DRAWN AEROBIC AND ANAEROBIC Blood Culture adequate volume   Culture   Final    NO GROWTH 5 DAYS Performed at [facility], [address], [place], [place] [address]    Report Status 07/06/2020 FINAL  Final  Culture, Respiratory w Gram Stain     Status: None   Collection Time: 07/03/20  2:17 PM   Specimen: Tracheal Aspirate; Respiratory  Result Value Ref Range Status   Specimen Description   Final    TRACHEAL ASPIRATE Performed at [facility], [address] [place], [place] [address]    Special Requests   Final    NONE Performed at [facility], [address] [place], [place] [address]    Gram Stain   Final    FEW WBC PRESENT, PREDOMINANTLY PMN ABUNDANT GRAM NEGATIVE RODS Performed at [facility], [place] [address] [place], [place] [address]    Culture   Final    ABUNDANT ESCHERICHIA COLI Confirmed Extended Spectrum Beta-Lactamase Producer (ESBL).  In bloodstream infections from ESBL organisms, carbapenems are preferred over piperacillin/tazobactam. They are shown to have a lower risk of mortality.    Report Status 07/08/2020 FINAL  Final   Organism ID, Bacteria ESCHERICHIA COLI  Final      Susceptibility   Escherichia coli - MIC*    AMPICILLIN >=32 RESISTANT Resistant     CEFAZOLIN >=64 RESISTANT Resistant     CEFEPIME 8 INTERMEDIATE Intermediate     CEFTAZIDIME RESISTANT Resistant     CEFTRIAXONE >=64 RESISTANT Resistant     CIPROFLOXACIN >=4 RESISTANT Resistant     GENTAMICIN <=1 SENSITIVE Sensitive     IMIPENEM <=0.25 SENSITIVE Sensitive     TRIMETH/SULFA <=20 SENSITIVE Sensitive     AMPICILLIN/SULBACTAM >=32 RESISTANT Resistant     PIP/TAZO <=4 SENSITIVE Sensitive     * ABUNDANT  ESCHERICHIA COLI  Urine Culture     Status: None   Collection Time: 07/07/20 10:43 AM   Specimen: Urine, Random  Result Value Ref Range Status   Specimen Description   Final    URINE, RANDOM Performed at [facility], [address], [place], [place] [address]    Special Requests   Final    NONE Performed at  [facility], [address], [place], [place] [address]    Culture   Final    NO GROWTH Performed at [facility], [place] [address] [place], [place] [address]    Report Status 07/08/2020 FINAL  Final  Gastrointestinal Panel by PCR , Stool     Status: None   Collection Time: 07/07/20 12:05 PM   Specimen: Stool  Result Value Ref Range Status   Campylobacter species NOT DETECTED NOT DETECTED Final   Plesimonas shigelloides NOT DETECTED NOT DETECTED Final   Salmonella species NOT DETECTED NOT DETECTED Final   Yersinia enterocolitica NOT DETECTED NOT DETECTED Final   Vibrio species NOT DETECTED NOT DETECTED Final   Vibrio cholerae NOT DETECTED NOT DETECTED Final   Enteroaggregative E coli (EAEC) NOT DETECTED NOT DETECTED Final   Enteropathogenic E coli (EPEC) NOT DETECTED NOT DETECTED Final   Enterotoxigenic E coli (ETEC) NOT DETECTED NOT DETECTED Final   Shiga like toxin producing E coli (STEC) NOT DETECTED NOT DETECTED Final   Shigella/Enteroinvasive E coli (EIEC) NOT DETECTED NOT DETECTED Final   Cryptosporidium NOT DETECTED NOT DETECTED Final   Cyclospora cayetanensis NOT DETECTED NOT DETECTED Final   Entamoeba histolytica NOT DETECTED NOT DETECTED Final   Giardia lamblia NOT DETECTED NOT DETECTED Final   Adenovirus F40/41 NOT DETECTED NOT DETECTED Final   Astrovirus NOT DETECTED NOT DETECTED Final   Norovirus GI/GII NOT DETECTED NOT DETECTED Final   Rotavirus A NOT DETECTED NOT DETECTED Final   Sapovirus (I, II, IV, and V) NOT DETECTED NOT DETECTED Final    Comment: Performed at [facility], [address] [place], [place] [address]   C Difficile Quick Screen w PCR reflex     Status: None   Collection Time: 07/07/20 12:05 PM  Result Value Ref Range Status   C Diff antigen NEGATIVE NEGATIVE Final   C Diff toxin NEGATIVE NEGATIVE Final   C Diff interpretation No C. difficile detected.  Final    Comment: Performed at [facility], [address] [place], [place] [address]  CULTURE, BLOOD (ROUTINE X 2) w Reflex to ID Panel     Status: None (Preliminary result)   Collection Time: 07/07/20  1:38 PM   Specimen: BLOOD  Result Value Ref Range Status   Specimen Description BLOOD BLOOD LEFT HAND  Final   Special Requests   Final    BOTTLES DRAWN AEROBIC AND ANAEROBIC Blood Culture adequate volume   Culture   Final    NO GROWTH 2 DAYS Performed at [facility], [address] [place], [place] [address]    Report Status PENDING  Incomplete  CULTURE, BLOOD (ROUTINE X 2) w Reflex to ID Panel     Status: None (Preliminary result)   Collection Time: 07/07/20  1:38 PM   Specimen: BLOOD  Result Value Ref Range Status   Specimen Description BLOOD BLOOD RIGHT HAND  Final   Special Requests   Final    BOTTLES DRAWN AEROBIC AND ANAEROBIC Blood Culture adequate volume   Culture   Final    NO GROWTH 2 DAYS Performed at [facility], [place],  [address]    Report Status PENDING  Incomplete  SARS CORONAVIRUS 2 (TAT 6-24 HRS) Nasopharyngeal Nasopharyngeal Swab     Status: None   Collection Time: 07/07/20  2:10 PM   Specimen: Nasopharyngeal Swab  Result Value Ref Range Status   SARS Coronavirus 2 NEGATIVE NEGATIVE Final  Comment: (NOTE) SARS-CoV-2 target nucleic acids are NOT DETECTED.  The SARS-CoV-2 RNA is generally detectable in upper and lower respiratory specimens during the acute phase of infection. Negative results do not preclude SARS-CoV-2 infection, do not rule out co-infections with other pathogens, and should not be used as the sole basis for treatment or other  patient management decisions. Negative results must be combined with clinical observations, patient history, and epidemiological information. The expected result is Negative.  Fact Sheet for Patients: SugarRoll.be  Fact Sheet for Healthcare Providers: https://www.woods-mathews.com/  This test is not yet approved or cleared by the [place] FDA and  has been authorized for detection and/or diagnosis of SARS-CoV-2 by FDA under an Emergency Use Authorization (EUA). This EUA will remain  in effect (meaning this test can be used) for the duration of the COVID-19 declaration under Se ction 564(b)(1) of the Act, 21 U.S.C. section 360bbb-3(b)(1), unless the authorization is terminated or revoked sooner.  Performed at [facility], [place] [address], [place], [place] [address]     Coagulation Studies: Recent Labs    07/08/20 3532  LABPROT 17.7*  INR 1.5*    Urinalysis: Recent Labs    07/07/20 1043  COLORURINE YELLOW*  LABSPEC 1.012  PHURINE 5.0  GLUCOSEU 150*  HGBUR LARGE*  BILIRUBINUR NEGATIVE  KETONESUR NEGATIVE  PROTEINUR 30*  NITRITE NEGATIVE  LEUKOCYTESUR NEGATIVE      Imaging: CT ABDOMEN PELVIS WO CONTRAST  Result Date: 07/08/2020 CLINICAL DATA:  Pelvic free fluid on FAST scan performed earlier today during code EXAM: CT ABDOMEN AND PELVIS WITHOUT CONTRAST TECHNIQUE: Multidetector CT imaging of the abdomen and pelvis was performed following the standard protocol without IV contrast. Unenhanced CT was performed per clinician order. Lack of IV contrast limits sensitivity and specificity, especially for evaluation of abdominal/pelvic solid viscera. COMPARISON:  07/08/2020, 06/25/2020 FINDINGS: Lower chest: There are bilateral pleural effusions, left greater than right, volume estimated less than 1 L on the left. Bilateral lower lobe consolidation, with dense left lower lobe consolidation likely representing atelectasis.  Diffuse airspace disease throughout the right lower lobe could reflect infection or aspiration. No pneumothorax. Unenhanced CT was performed per clinician order. Lack of IV contrast limits sensitivity and specificity, especially for evaluation of abdominal/pelvic solid viscera. Hepatobiliary: High attenuation material within the gallbladder may reflect sludge. No evidence of acute cholecystitis. Unenhanced imaging of the liver is unremarkable. Pancreas: Unremarkable. No pancreatic ductal dilatation or surrounding inflammatory changes. Spleen: Normal in size without focal abnormality. Adrenals/Urinary Tract: No urinary tract calculi or obstructive uropathy. Stable hilar vascular calcifications. Bladder is decompressed with a Foley catheter. The adrenals are unremarkable. Stomach/Bowel: No evidence of high-grade bowel obstruction. Mildly distended loops of jejunum within the left mid abdomen, with scattered gas fluid levels, likely reflect postoperative ileus. Normal retrocecal appendix. Enteric catheter extends through the stomach, tip in the proximal duodenum. Vascular/Lymphatic: Evaluation of the vessels is limited without intravenous contrast. Postsurgical changes are seen from interval aortobifemoral bypass procedure. Extensive atherosclerosis within the native aorta and its branches. Retroperitoneal fluid and fat stranding likely reflects postoperative change given recent surgical procedure 1 week ago. No pathologic adenopathy within the abdomen or pelvis. Reproductive: Prostate is unremarkable. Other: Trace free fluid within the upper abdomen and bilateral flanks. There is minimal higher attenuation fluid within the left lower quadrant which could reflect blood products, compatible with recent surgery. No free gas. Postsurgical changes from midline laparotomy. There is mild diffuse subcutaneous edema. Musculoskeletal: No acute or destructive bony lesions.  Reconstructed images demonstrate no additional findings.  IMPRESSION: 1. Limited evaluation of the postoperative abdomen without intravenous or oral contrast. 2. Postsurgical changes from recent aortobifemoral bypass procedure, with small amount of free intraperitoneal fluid and retroperitoneal fluid as above. Fluid in the left lower quadrant is mildly increased in attenuation, consistent with blood products. This is most likely related to recent surgical intervention. Evaluation of the vascular lumen and bypass grafts cannot be assessed without IV contrast. 3. Bilateral pleural effusions with bibasilar consolidation, left greater than right. Findings are consistent with a combination of airspace disease and atelectasis. 4. Mild distension of the proximal jejunum with scattered gas fluid levels, most consistent with ileus. No evidence of high-grade obstruction. 5.  Aortic Atherosclerosis (ICD10-I70.0). Electronically Signed   By: Randa Ngo M.D.   On: 07/08/2020 15:27   DG Abd 1 View  Result Date: 07/08/2020 CLINICAL DATA:  Intubation.  OG tube placement. EXAM: ABDOMEN - 1 VIEW COMPARISON:  Chest x-ray 07/07/2020. FINDINGS: Endotracheal tube not visualized. Central line noted with tip over SVC. OG tube noted with tip over stomach. Prominent bowel distention noted. Abdominal series should be considered for further evaluation. No free air identified. Degenerative change thoracolumbar spine. Surgical staples noted over the abdomen. IMPRESSION: 1. Endotracheal tube not visualized. Central line noted with tip over SVC. OG tube noted with tip over the stomach. 2. Prominent bowel distention noted. Abdominal series should be considered for further evaluation. No free air identified. Electronically Signed   By: Marcello Moores  Register   On: 07/08/2020 07:00   Portable Chest x-ray  Result Date: 07/08/2020 CLINICAL DATA:  Intubation. EXAM: PORTABLE CHEST 1 VIEW COMPARISON:  07/07/2020. FINDINGS: Interim removal of left IJ line. Endotracheal tube noted with tip 3 cm above the  carina. NG tube, right IJ line in stable position. Heart size normal. Low lung volumes with bilateral subsegmental atelectasis. Interim improvement in aeration from prior exam. Persistent mild bilateral interstitial prominence. Interim clearing of bilateral pleural effusions. No pneumothorax. IMPRESSION: 1. Interim removal of left IJ line. Endotracheal tube noted with tip 3 cm above the carina. NG tube, right IJ line stable position. 2. Low lung volumes with bilateral subsegmental atelectasis. Interim improvement in aeration from prior exam. Persistent mild bilateral interstitial prominence. Interim clearing of bilateral pleural effusions. Electronically Signed   By: Marcello Moores  Register   On: 07/08/2020 06:37   ECHOCARDIOGRAM LIMITED  Result Date: 07/08/2020    ECHOCARDIOGRAM LIMITED REPORT   Patient Name:   Ivan Larsen [facility] Date of Exam: 07/08/2020 Medical Rec #:  [id number]         Height:       65.0 in Accession #:    [id number]        Weight:       170.4 lb Date of Birth:  06/24/1955         BSA:          1.848 m Patient Age:    65 years          BP:           111/60 mmHg Patient Gender: M                 HR:           99 bpm. Exam Location:  [facility] Procedure: Limited Echo, Cardiac Doppler and Color Doppler Indications:     Cardiac Arrest I46.9  History:         Patient has prior history of Echocardiogram examinations, most  recent 07/03/2020. Previous Myocardial Infarction and CAD; Risk                  Factors:Dyslipidemia and Diabetes.  Sonographer:     Sherrie Sport RDCS (AE) Referring Phys:  [id number] DONALD BRESCIA Diagnosing Phys: Bartholome Bill MD  Sonographer Comments: Echo performed with patient supine and on artificial respirator, suboptimal parasternal window, suboptimal apical window and no subcostal window. IMPRESSIONS  1. Left ventricular ejection fraction, by estimation, is 55 to 60%. Left ventricular ejection fraction by PLAX is 56 %. The left ventricle has normal function. The left  ventricle has no regional wall motion abnormalities.  2. Right ventricular systolic function was not well visualized. The right ventricular size is not well visualized.  3. The mitral valve was not well visualized. Trivial mitral valve regurgitation.  4. The aortic valve was not well visualized. Aortic valve regurgitation is trivial. FINDINGS  Left Ventricle: Left ventricular ejection fraction, by estimation, is 55 to 60%. Left ventricular ejection fraction by PLAX is 56 %. The left ventricle has normal function. The left ventricle has no regional wall motion abnormalities. The left ventricular internal cavity size was normal in size. There is no left ventricular hypertrophy. Right Ventricle: The right ventricular size is not well visualized. Right vetricular wall thickness was not well visualized. Right ventricular systolic function was not well visualized. Left Atrium: Left atrial size was normal in size. Right Atrium: Right atrial size was normal in size. Pericardium: There is no evidence of pericardial effusion. Mitral Valve: The mitral valve was not well visualized. Trivial mitral valve regurgitation. Tricuspid Valve: The tricuspid valve is not well visualized. Tricuspid valve regurgitation is trivial. Aortic Valve: The aortic valve was not well visualized. Aortic valve regurgitation is trivial. Pulmonic Valve: The pulmonic valve was not assessed. Aorta: The aortic root was not well visualized. IAS/Shunts: The interatrial septum was not well visualized. LEFT VENTRICLE PLAX 2D LV EF:         Left ventricular ejection fraction by PLAX is 56 %. LVIDd:         3.43 cm LVIDs:         2.45 cm LV PW:         1.27 cm LV IVS:        1.07 cm  LEFT ATRIUM           Index       RIGHT ATRIUM           Index LA diam:      3.40 cm 1.84 cm/m  RA Area:     13.00 cm LA Vol (A4C): 28.2 ml 15.26 ml/m RA Volume:   30.90 ml  16.72 ml/m   AORTA Ao Root diam: 1.40 cm Bartholome Bill MD Electronically signed by Bartholome Bill MD  Signature Date/Time: 07/08/2020/4:36:00 PM    Final    US Abdomen Limited RUQ (LIVER/GB)  Result Date: 07/08/2020 CLINICAL DATA:  Elevated liver function tests. EXAM: ULTRASOUND ABDOMEN LIMITED RIGHT UPPER QUADRANT COMPARISON:  CT abdomen and pelvis 06/25/2020. FINDINGS: Gallbladder: There is a moderately large volume of sludge within the gallbladder. Two small polyps are identified each measuring approximately 0.4 cm. No wall thickening or pericholecystic fluid. Sonographer reports negative Murphy's sign. Common bile duct: Diameter: 0.4 cm Liver: No focal lesion is seen. Echogenicity is increased. Portal vein is patent on color Doppler imaging with normal direction of blood flow towards the liver. Other: A small volume of abdominal and pelvic ascites is present. IMPRESSION: Fatty  infiltration of the liver. Small volume of ascites. Gallbladder sludge. Negative for stones or evidence of cholecystitis. Two 0.4 cm gallbladder polyps. Per consensus statement, gallbladder polyps less than 6 mm are usually benign and not require follow-up. Electronically Signed   By: Inge Rise M.D.   On: 07/08/2020 10:36     Medications:   . sodium chloride 50 mL/hr at 07/09/20 0630  . sodium chloride 10 mL/hr at 07/09/20 0516  . sodium chloride    . anticoagulant sodium citrate    . fentaNYL infusion INTRAVENOUS Stopped (07/09/20 1428)  . magnesium sulfate bolus IVPB    . norepinephrine (LEVOPHED) Adult infusion Stopped (07/08/20 1323)  . piperacillin-tazobactam (ZOSYN)  IV 2.25 g (07/09/20 1336)  . potassium chloride    . TPN ADULT (ION) 80 mL/hr at 07/09/20 0300  . TPN ADULT (ION)     . sodium chloride   Intravenous Once  . sodium chloride   Intravenous Once  . aspirin EC  81 mg Oral Q0600  . chlorhexidine gluconate (MEDLINE KIT)  15 mL Mouth Rinse BID  . Chlorhexidine Gluconate Cloth  6 each Topical Once  . collagenase  1 application Topical Daily  . docusate  100 mg Per Tube Daily  . free water  200  mL Per Tube Q4H  . hydrocortisone sod succinate (SOLU-CORTEF) inj  50 mg Intravenous Q8H  . insulin aspart  0-20 Units Subcutaneous Q4H  . insulin detemir  15 Units Subcutaneous BID  . mouth rinse  15 mL Mouth Rinse 10 times per day  . midodrine  10 mg Per Tube TID WC  . pantoprazole (PROTONIX) IV  40 mg Intravenous Q12H  . sodium bicarbonate  150 mEq Intravenous Once   sodium chloride, acetaminophen **OR** acetaminophen, alum & mag hydroxide-simeth, anticoagulant sodium citrate, hydrALAZINE, magnesium sulfate bolus IVPB, morphine injection, oxyCODONE-acetaminophen, phenol, senna-docusate, sorbitol, white petrolatum  Assessment/ Plan:  Ivan Larsen is a 65 y.o. white male with peripheral vascular disease, coronary artery disease, nephrolithiasis, hyperlipidemia, diabetes mellitus type II, who was admitted to [facility] on 06/28/2020 for Atherosclerosis of artery of extremity with ulceration ([place]) [I70.299, Q03.474] S/P aorto-bifemoral bypass surgery [Q59.563] Underwent bilateral femoral artery bypass graft by Dr. Lucky Cowboy and Dr. Delana Meyer. on 06/24/2020   #. Acute renal failure : with baseline creatinine of 0.9 with normal GFR on 06/25/20.  History of glycosuria and underlying diabetic kidney disease.  AKI likely ATN due to hypotension  02/23 0701 - 02/24 0700 In: 3114.2 [I.V.:2157.9; NG/GT:120; IV Piggyback:836.2] Out: 2900 [Urine:2500; Stool:400]  Lab Results  Component Value Date   CREATININE 3.69 (H) 07/09/2020   CREATININE 4.56 (H) 07/08/2020   CREATININE 4.83 (H) 07/07/2020    Continues to have large amount of edema Patient pulled out HD catheter, Will monitor closely for the need for HD  S Creatinine trends are starting to improve and urine output has also improved compared to yesterday Continue supportive care and avoid hypotension   #Acute respiratory failure Requiring ventilator support  # hypokalemia Being supplemented as per ICU protocol  # Hypernatremia -Levels are  worse again Consider supplementing D5W  # Generalized edema Iv lasix x 1, good response Monitor for now    LOS: 8 Trevionne Advani 2/24/20223:05 PM

## 2020-07-10 ENCOUNTER — Inpatient Hospital Stay: Payer: Medicare Other

## 2020-07-10 DIAGNOSIS — Z95828 Presence of other vascular implants and grafts: Secondary | ICD-10-CM | POA: Diagnosis not present

## 2020-07-10 DIAGNOSIS — J189 Pneumonia, unspecified organism: Secondary | ICD-10-CM | POA: Diagnosis not present

## 2020-07-10 DIAGNOSIS — R945 Abnormal results of liver function studies: Secondary | ICD-10-CM

## 2020-07-10 DIAGNOSIS — N171 Acute kidney failure with acute cortical necrosis: Secondary | ICD-10-CM | POA: Diagnosis not present

## 2020-07-10 DIAGNOSIS — I468 Cardiac arrest due to other underlying condition: Secondary | ICD-10-CM

## 2020-07-10 DIAGNOSIS — Z978 Presence of other specified devices: Secondary | ICD-10-CM | POA: Diagnosis not present

## 2020-07-10 DIAGNOSIS — J9601 Acute respiratory failure with hypoxia: Secondary | ICD-10-CM

## 2020-07-10 DIAGNOSIS — N170 Acute kidney failure with tubular necrosis: Secondary | ICD-10-CM

## 2020-07-10 DIAGNOSIS — R197 Diarrhea, unspecified: Secondary | ICD-10-CM | POA: Diagnosis not present

## 2020-07-10 DIAGNOSIS — I70299 Other atherosclerosis of native arteries of extremities, unspecified extremity: Secondary | ICD-10-CM | POA: Diagnosis not present

## 2020-07-10 LAB — TYPE AND SCREEN
ABO/RH(D): A POS
Antibody Screen: NEGATIVE
Unit division: 0
Unit division: 0
Unit division: 0

## 2020-07-10 LAB — BPAM RBC
Blood Product Expiration Date: 202203072359
Blood Product Expiration Date: 202203212359
Blood Product Expiration Date: 202203242359
ISSUE DATE / TIME: 202202211428
ISSUE DATE / TIME: 202202230608
Unit Type and Rh: 600
Unit Type and Rh: 6200
Unit Type and Rh: 6200

## 2020-07-10 LAB — BASIC METABOLIC PANEL
Anion gap: 10 (ref 5–15)
BUN: 94 mg/dL — ABNORMAL HIGH (ref 8–23)
CO2: 29 mmol/L (ref 22–32)
Calcium: 7.2 mg/dL — ABNORMAL LOW (ref 8.9–10.3)
Chloride: 117 mmol/L — ABNORMAL HIGH (ref 98–111)
Creatinine, Ser: 2.88 mg/dL — ABNORMAL HIGH (ref 0.61–1.24)
GFR, Estimated: 23 mL/min — ABNORMAL LOW (ref >60)
Glucose, Bld: 457 mg/dL — ABNORMAL HIGH (ref 70–99)
Potassium: 3.4 mmol/L — ABNORMAL LOW (ref 3.5–5.1)
Sodium: 156 mmol/L — ABNORMAL HIGH (ref 135–145)

## 2020-07-10 LAB — CBC
HCT: 24.9 % — ABNORMAL LOW (ref 39.0–52.0)
Hemoglobin: 8.1 g/dL — ABNORMAL LOW (ref 13.0–17.0)
MCH: 28.2 pg (ref 26.0–34.0)
MCHC: 32.5 g/dL (ref 30.0–36.0)
MCV: 86.8 fL (ref 80.0–100.0)
Platelets: 174 10*3/uL (ref 150–400)
RBC: 2.87 MIL/uL — ABNORMAL LOW (ref 4.22–5.81)
RDW: 22.4 % — ABNORMAL HIGH (ref 11.5–15.5)
WBC: 33.7 10*3/uL — ABNORMAL HIGH (ref 4.0–10.5)
nRBC: 2.2 % — ABNORMAL HIGH (ref 0.0–0.2)

## 2020-07-10 LAB — GLUCOSE, CAPILLARY
Glucose-Capillary: 179 mg/dL — ABNORMAL HIGH (ref 70–99)
Glucose-Capillary: 218 mg/dL — ABNORMAL HIGH (ref 70–99)
Glucose-Capillary: 223 mg/dL — ABNORMAL HIGH (ref 70–99)
Glucose-Capillary: 229 mg/dL — ABNORMAL HIGH (ref 70–99)
Glucose-Capillary: 249 mg/dL — ABNORMAL HIGH (ref 70–99)
Glucose-Capillary: 307 mg/dL — ABNORMAL HIGH (ref 70–99)
Glucose-Capillary: 322 mg/dL — ABNORMAL HIGH (ref 70–99)
Glucose-Capillary: 332 mg/dL — ABNORMAL HIGH (ref 70–99)
Glucose-Capillary: 345 mg/dL — ABNORMAL HIGH (ref 70–99)
Glucose-Capillary: 374 mg/dL — ABNORMAL HIGH (ref 70–99)
Glucose-Capillary: 380 mg/dL — ABNORMAL HIGH (ref 70–99)
Glucose-Capillary: 389 mg/dL — ABNORMAL HIGH (ref 70–99)
Glucose-Capillary: 399 mg/dL — ABNORMAL HIGH (ref 70–99)
Glucose-Capillary: 415 mg/dL — ABNORMAL HIGH (ref 70–99)

## 2020-07-10 LAB — PHOSPHORUS: Phosphorus: 3.4 mg/dL (ref 2.5–4.6)

## 2020-07-10 LAB — TROPONIN I (HIGH SENSITIVITY): Troponin I (High Sensitivity): 1159 ng/L (ref <18)

## 2020-07-10 LAB — PREPARE RBC (CROSSMATCH)

## 2020-07-10 LAB — MAGNESIUM: Magnesium: 2.1 mg/dL (ref 1.7–2.4)

## 2020-07-10 IMAGING — CT CT HEAD W/O CM
5 of 8 series · 17 of 47 positions shown, 18 images · non-contrast
Comparison: [DATE] head CT and brain MRI

CLINICAL DATA: Altered mental status

EXAM:
CT HEAD WITHOUT CONTRAST
TECHNIQUE: Contiguous axial images were obtained from the base of the skull
through the vertex without intravenous contrast.

[Series 2: head bone · axial · 0.45mm/px · z∈[+25,+157]mm · 8 of 84 slices shown]
[im 9/84  bone]
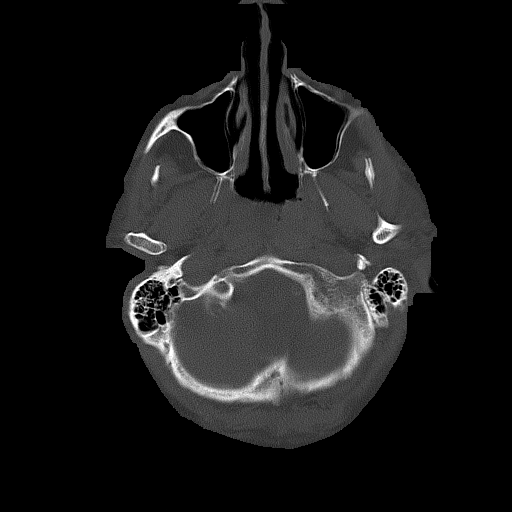
[im 17/84  bone]
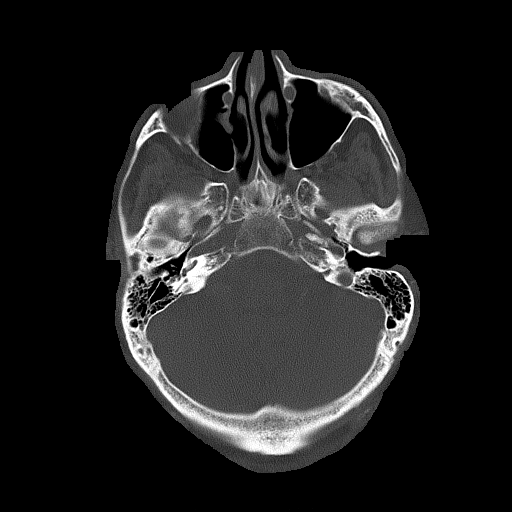
[im 25/84  bone]
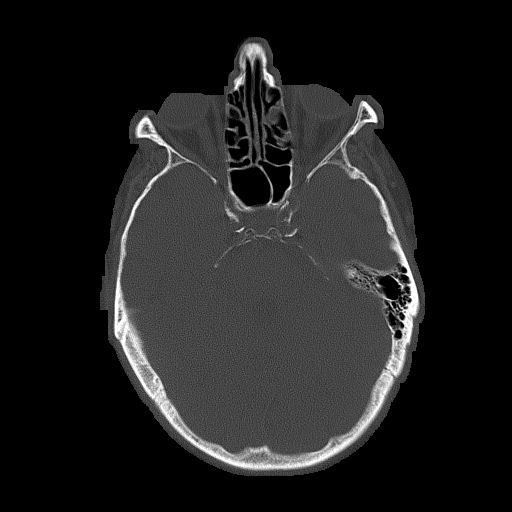
[im 34/84  bone]
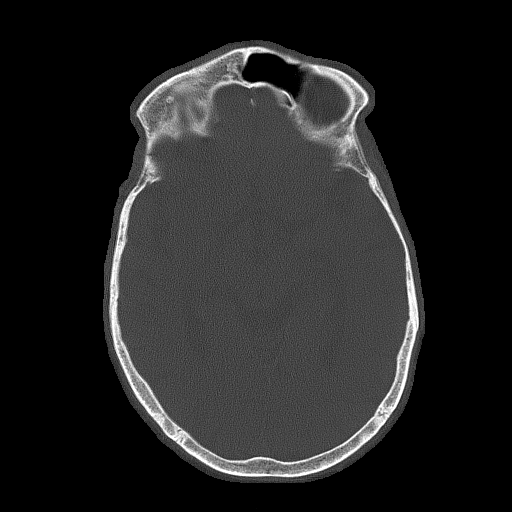
[im 50/84  bone]
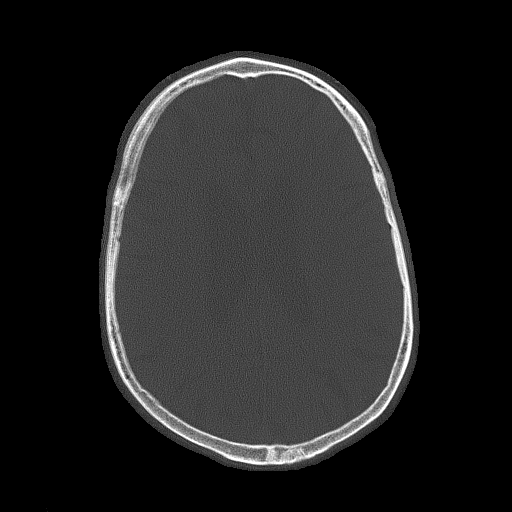
[im 59/84  bone]
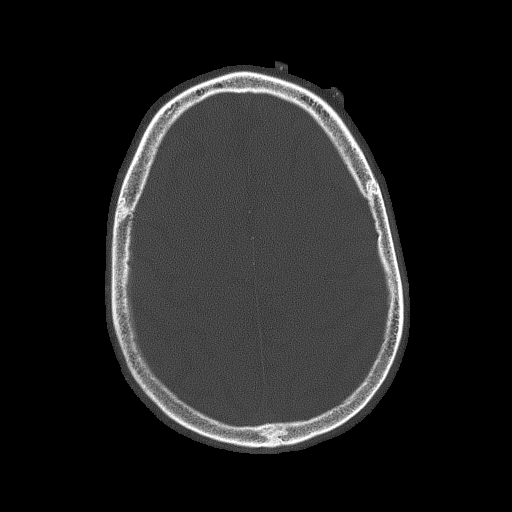
[im 67/84  bone]
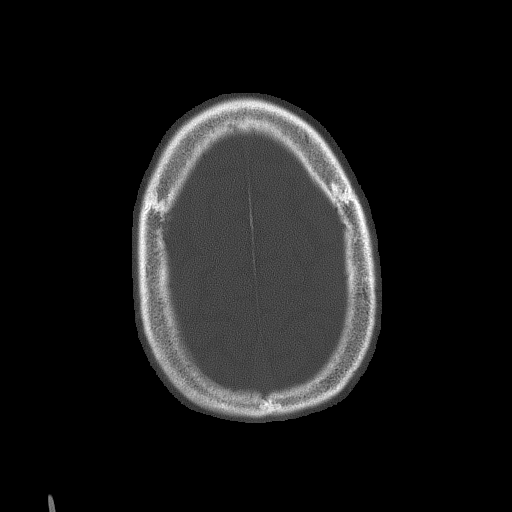
[im 75/84  bone]
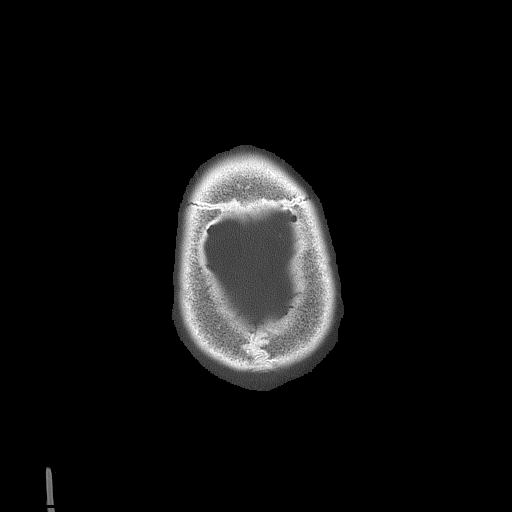

[Series 3: head wo · axial · 0.45mm/px · z∈[+64,+119]mm · 2 of 34 slices shown, 3 images (1 of 2)]
[im 12/34  brain]
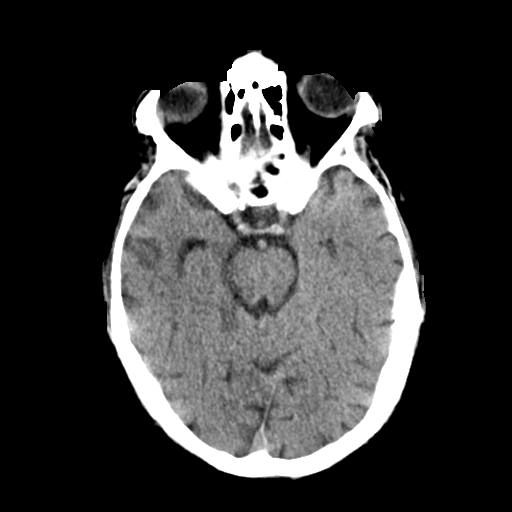
[im 12/34  bone]
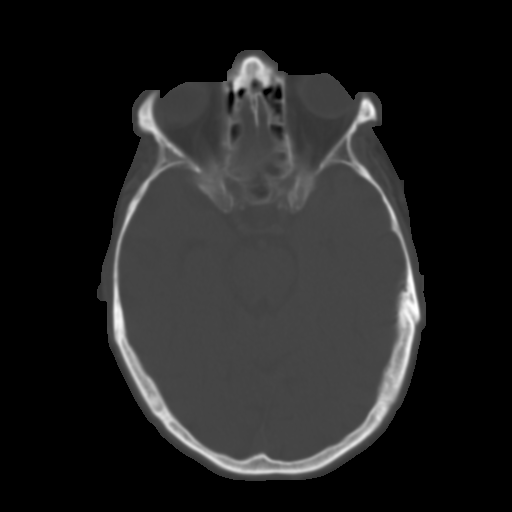
[im 23/34  brain]
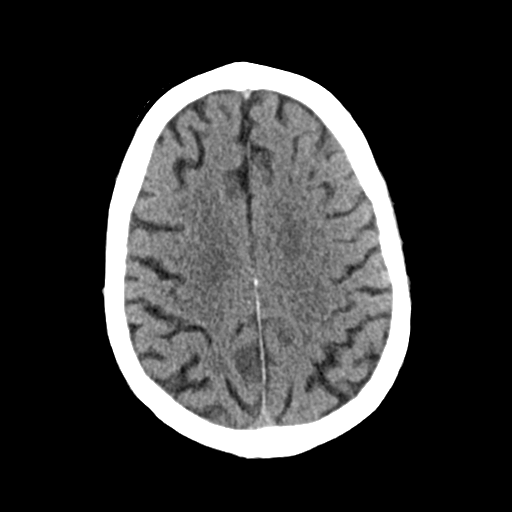

[Series 5: head wo · axial · 0.45mm/px · z∈[+64,+119]mm · 2 of 34 slices shown (2 of 2)]
[im 12/34  brain]
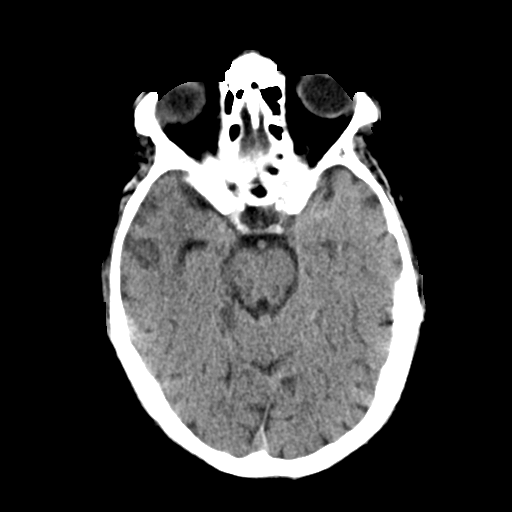
[im 23/34  brain]
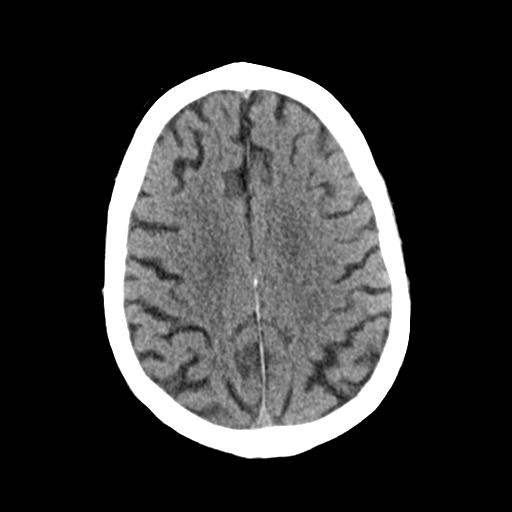

[Series 6: coronal soft tissue · coronal · 0.37mm/px · 3 of 76 slices shown]
[im 19/76  brain]
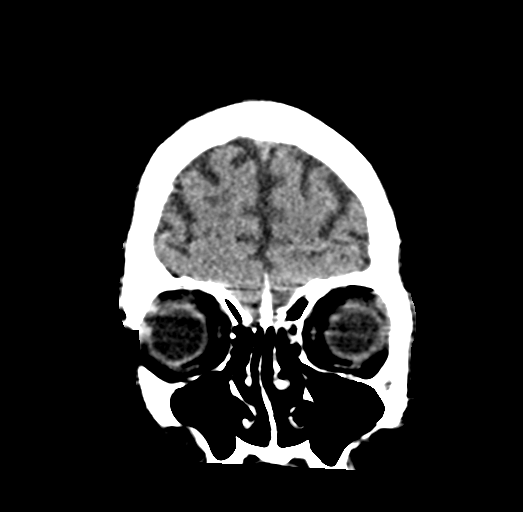
[im 38/76  brain]
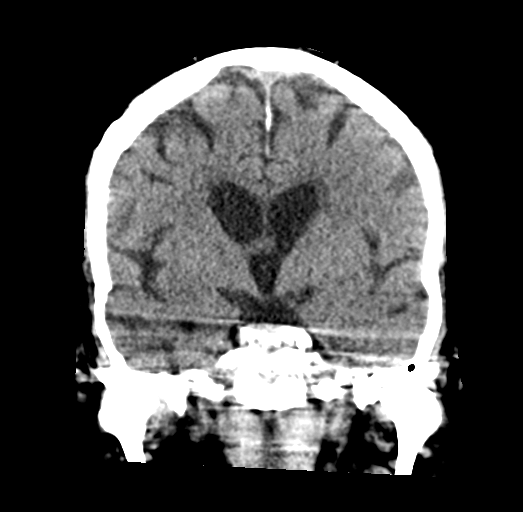
[im 57/76  brain]
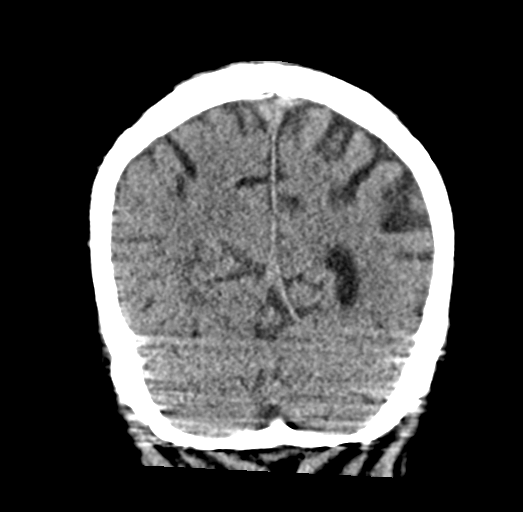

[Series 9: sagittal soft tissue · sagittal · 0.35mm/px · 2 of 56 slices shown]
[im 19/56  brain]
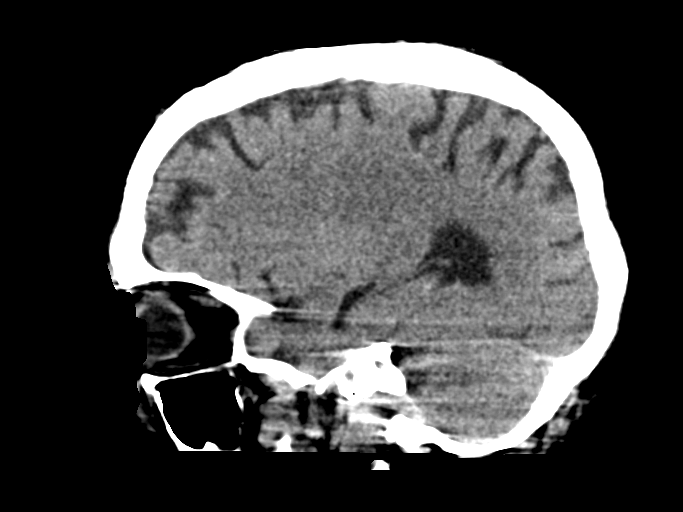
[im 37/56  brain]
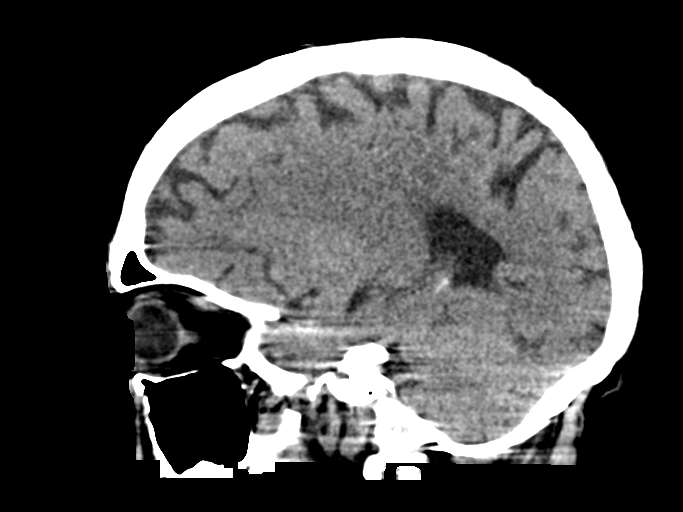

[17 of 47 positions shown; findings below may reference images not displayed]

FINDINGS: Brain: There is mild diffuse atrophy. There is no intracranial mass,
hemorrhage, extra-axial fluid collection, or midline shift. There is
mild soft decreased attenuation in the centra semiovale bilaterally.
Elsewhere brain parenchyma appears unremarkable. There is no
demonstrable acute infarct.

Vascular: There is no hyperdense vessel. There is calcification in
each carotid siphon region.

Skull: Bony calvarium appears intact.

Sinuses/Orbits: There is mucosal thickening in several ethmoid air
cells. There is opacification in the lateral right sphenoid sinus.
Orbits appear symmetric bilaterally.

Other: Mastoid air cells are clear.
IMPRESSION: Mild atrophy with mild periventricular small vessel disease. No
acute infarct evident. No mass or hemorrhage.

There are foci of arterial vascular calcification. There are
scattered foci of paranasal sinus disease.

## 2020-07-10 MED ORDER — HYDROMORPHONE HCL 1 MG/ML IJ SOLN
1.0000 mg | INTRAMUSCULAR | Status: DC | PRN
  Administered 2020-07-11 – 2020-07-14 (×3): 1 mg via INTRAVENOUS
  Filled 2020-07-10 (×4): qty 1

## 2020-07-10 MED ORDER — FUROSEMIDE 10 MG/ML IJ SOLN
40.0000 mg | Freq: Once | INTRAMUSCULAR | Status: AC
  Administered 2020-07-10: 40 mg via INTRAVENOUS
  Filled 2020-07-10: qty 4

## 2020-07-10 MED ORDER — HYDROMORPHONE HCL 1 MG/ML IJ SOLN: 1.0000 mg | Freq: Once | INTRAMUSCULAR | Status: AC

## 2020-07-10 MED ORDER — VITAL 1.5 CAL PO LIQD
1000.0000 mL | ORAL | Status: DC
  Administered 2020-07-10: 1000 mL

## 2020-07-10 MED ORDER — TRACE MINERALS CU-MN-SE-ZN 300-55-60-3000 MCG/ML IV SOLN
INTRAVENOUS | Status: AC
  Filled 2020-07-10: qty 691.2

## 2020-07-10 MED ORDER — POTASSIUM CHLORIDE 10 MEQ/100ML IV SOLN
10.0000 meq | INTRAVENOUS | Status: AC
  Administered 2020-07-10 (×3): 10 meq via INTRAVENOUS
  Filled 2020-07-10 (×3): qty 100

## 2020-07-10 MED ORDER — FREE WATER
200.0000 mL | Status: DC
  Administered 2020-07-10 – 2020-07-11 (×7): 200 mL

## 2020-07-10 MED ORDER — VITAL HIGH PROTEIN PO LIQD: 1000.0000 mL | ORAL | Status: DC

## 2020-07-10 MED ORDER — INSULIN DETEMIR 100 UNIT/ML ~~LOC~~ SOLN
30.0000 [IU] | Freq: Two times a day (BID) | SUBCUTANEOUS | Status: DC
  Filled 2020-07-10 (×2): qty 0.3

## 2020-07-10 MED ORDER — PIPERACILLIN-TAZOBACTAM 3.375 G IVPB
3.3750 g | Freq: Three times a day (TID) | INTRAVENOUS | Status: DC
  Administered 2020-07-10 – 2020-07-11 (×4): 3.375 g via INTRAVENOUS
  Filled 2020-07-10 (×5): qty 50

## 2020-07-10 MED ORDER — FONDAPARINUX SODIUM 2.5 MG/0.5ML ~~LOC~~ SOLN
2.5000 mg | SUBCUTANEOUS | Status: DC
  Administered 2020-07-10 – 2020-07-11 (×2): 2.5 mg via SUBCUTANEOUS
  Filled 2020-07-10 (×2): qty 0.5

## 2020-07-10 MED ORDER — INSULIN REGULAR(HUMAN) IN NACL 100-0.9 UT/100ML-% IV SOLN
INTRAVENOUS | Status: DC
  Administered 2020-07-10: 12 [IU]/h via INTRAVENOUS
  Administered 2020-07-10: 20 [IU]/h via INTRAVENOUS
  Administered 2020-07-10: 24 [IU]/h via INTRAVENOUS
  Administered 2020-07-11: 11.5 [IU]/h via INTRAVENOUS
  Administered 2020-07-12: 5.5 [IU]/h via INTRAVENOUS
  Administered 2020-07-12: 13 [IU]/h via INTRAVENOUS
  Administered 2020-07-13: 8 [IU]/h via INTRAVENOUS
  Administered 2020-07-13: 15 [IU]/h via INTRAVENOUS
  Administered 2020-07-14: 9.5 [IU]/h via INTRAVENOUS
  Administered 2020-07-14: 10.5 [IU]/h via INTRAVENOUS
  Filled 2020-07-10 (×10): qty 100

## 2020-07-10 MED ORDER — HYDROMORPHONE HCL 1 MG/ML IJ SOLN
INTRAMUSCULAR | Status: AC
  Administered 2020-07-10: 1 mg via INTRAVENOUS
  Filled 2020-07-10: qty 1
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
## 2020-07-10 NOTE — Progress Notes (Signed)
Inpatient Diabetes Program Recommendations  AACE/ADA: New Consensus Statement on Inpatient Glycemic Control (2015)  Target Ranges:  Prepandial:   less than 140 mg/dL      Peak postprandial:   less than 180 mg/dL (1-2 hours)      Critically ill patients:  140 - 180 mg/dL   Lab Results  Component Value Date   GLUCAP 389 (H) 07/10/2020   HGBA1C 5.7 (H) 07/03/2020    Review of Glycemic Control Results for BABE, CLENNEY (MRN [id number]) as of 07/10/2020 08:21  Ref. Range 07/09/2020 19:20 07/09/2020 23:18 07/10/2020 03:52 07/10/2020 07:26  Glucose-Capillary Latest Ref Range: 70 - 99 mg/dL 967 (H) 591 (H) 638 (H) 389 (H)  Diabetes history: DM 2 Outpatient Diabetes medications:  Jardiance 25 mg daily Metformin 1000 mg bid Januvia 25 mg daily Current orders for Inpatient glycemic control:  Levemir 30 units q 12 hours Novolog resistant q 4 hours Solucortef 50 mg IV q 8 hours Inpatient Diabetes Program Recommendations:    Note blood sugars continue to be > goal.  Consider IV insulin.   Thanks,  Beryl Meager, RN, BC-ADM Inpatient Diabetes Coordinator Pager [phone] (8a-5p)

## 2020-07-10 NOTE — Progress Notes (Signed)
PCCM   1. CT head without acute abnormality 2. Troponin continues to fall post event (arrest 2/23); no WMA were noted of f/u echo, no ST elevation on EKG post  //Ayub Kirsh

## 2020-07-10 NOTE — Progress Notes (Signed)
Vein & Vascular Surgery Daily Progress Note   Subjective: 07/10/2020: 1. Aortobifemoral bypass with 57mm diameter proximal 63mm diameter distal bifurcated Dacron graft 2.Left common femoral and superficial femoral artery endarterectomies 3. Right common femoral and superficial femoral artery endarterectomies 4.Catheter placement to the aorta from bilateral femoral approaches and aortogram and iliofemoral arteriogram 5.Catheter placement of the left popliteal artery from left femoral approach and left lower extremity angiogram 6.Proximal infrarenal aorta endarterectomy 7.Extensive bilateral renal endarterectomies  Patient is intubated and sedated.  No family at bedside this afternoon.  Objective: Vitals:   07/10/20 1300 07/10/20 1321 07/10/20 1400 07/10/20 1500  BP: (!) 159/67  (!) 169/86 (!) 144/77  Pulse: 94  (!) 114 (!) 127  Resp: 17  19 (!) 23  Temp: (!) 96.98 F (36.1 C)  (!) 97.52 F (36.4 C) 98.24 F (36.8 C)  TempSrc:      SpO2: 98% 99% 96% 98%  Weight:      Height:        Intake/Output Summary (Last 24 hours) at 07/10/2020 1506 Last data filed at 07/10/2020 1400 Gross per 24 hour  Intake 7218.71 ml  Output 2950 ml  Net 4268.71 ml   Physical Exam: Sedated and intubated. In no acute distress. Cardiovascular:Regular rate and rhythm Pulmonary:Decreased bilaterally Abdomen:Soft, nontender, nondistended positive bowel sounds Midline incision:Intact no signs of infection             Groin incision:  Intact no signs of infection SA:[id number] intact draining clear urine Extremity:Warm distally toes. Foot is warm. Good capillary refill.    Laboratory: CBC    Component Value Date/Time   WBC 33.7 (H) 07/10/2020 0312   HGB 8.1 (L) 07/10/2020 0312   HGB 14.7 06/04/2020 1441   HCT 24.9 (L) 07/10/2020 0312   HCT 45.6 06/04/2020 1441   PLT 174 07/10/2020 0312   PLT 320 06/04/2020 1441   BMET    Component Value Date/Time    NA 156 (H) 07/10/2020 0312   NA 140 06/02/2020 1347   NA 141 07/03/2012 0511   K 3.4 (L) 07/10/2020 0312   K 4.0 07/03/2012 0511   CL 117 (H) 07/10/2020 0312   CL 108 (H) 07/03/2012 0511   CO2 29 07/10/2020 0312   CO2 24 07/03/2012 0511   GLUCOSE 457 (H) 07/10/2020 0312   GLUCOSE 185 (H) 07/03/2012 0511   BUN 94 (H) 07/10/2020 0312   BUN 24 06/02/2020 1347   BUN 14 07/03/2012 0511   CREATININE 2.88 (H) 07/10/2020 0312   CREATININE 0.86 07/03/2012 0511   CALCIUM 7.2 (L) 07/10/2020 0312   CALCIUM 8.1 (L) 07/03/2012 0511   GFRNONAA 23 (L) 07/10/2020 0312   GFRNONAA >60 07/03/2012 0511   GFRAA 76 06/02/2020 1347   GFRAA >60 07/03/2012 0511   Assessment/Planning: The patient is a 65 year old male with severe atherosclerotic disease to the lower extremity status post aortobifemoral bypass, renal artery endarterectomy, aortic and femoral endarterectomies  1) Failed breathing trial. Still sedated and intubated. 2) Leukocytosis. Patient's white blood cell count today is 33.7. Currently being treated for pneumonia and is on steroids every 8 hours. Follow-up AM labs. 3) PEA arrest with ROSC Thursday AM.  Troponin trending down.  4) Creatinine trending down.  Would continue to avoid nephrotoxic agents. Nephrology following. 5) Insulin drip initiated today. 6) Hypernatremia.  TPN to be adjusted.   Appreciate the assistance from the intensivist, nephrology, palliative care, social work, and pharmacy services in managing this complicated patient.  Seen and examined  with Dr. Wallis Mart Diontre Harps PA-C 07/10/2020 3:06 PM

## 2020-07-10 NOTE — Progress Notes (Signed)
PHARMACY - TOTAL PARENTERAL NUTRITION CONSULT NOTE   Indication: Prolonged ileus  Patient Measurements: Height: 5\' 5"  (165.1 cm) Weight: 76.2 kg (167 lb 15.9 oz) IBW/kg (Calculated) : 61.5 TPN AdjBW (KG): 63.5 Body mass index is 27.96 kg/m.  Assessment: 65 year old male with PMHx of CAD, PAD, DM, HLD, MI s/p aortobifemoral bypass, bilateral renal endarterectomies, aortic endarterectomy, and bilateral femoral endarterectomies. Tube feeds were unable to be initiated yesterday. Patient was 6 days without nutrition prior to TPN initiation.   Glucose / Insulin: 225 - 399  85 units SSI required Levemir 15 units q 12 hours Novolog resistant q 4 hours Solucortef 50 mg IV q 8 hours Electrolytes: hypokalemia  Renal: iHD Hepatic:  Intake / Output; MIVF: 3994 / 3075 (+ 919 mL) GI Imaging: 2/21: Suspect a degree of bowel ileus. No evident free air on semi-erect image GI Surgeries / Procedures: no recent  Central access: 07/07/2020 TPN start date: 07/07/20  Nutritional Goals (per RD recommendation on 07/07/20): kCal: 1800 - 2000/day, Protein: 95 - 110 grams/day, Fluid: 2L/day  Current Nutrition:  NPO  Plan:   continue  TPN at 80 mL/hr at 1800 (2020 mL total)  Nutritional Components  Amino Acids (Clinisol 15%) 54 g/L: 103.7 grams total  Dextrose: 19%: 365 grams total  KCal: 1655 total/day   Electrolytes in TPN: Na 89mEq/L, K 85mEq/L, Ca 64mEq/L, Mg 25mEq/L, and Phos 0 mmol/L. Cl:Ac 1:1  Add standard MVI and trace elements to TPN  stop SSI and levemir and start insulin infusion  Continue free water 200 mL per tube q4h (per Dr 4m)  Repeat 10 mEq IV KCl x 3  0.45% NaCl infusion stopped  Monitor TPN labs on Mon/Thurs, daily until stable   Earlie Server 07/10/2020,7:19 AM

## 2020-07-10 NOTE — Progress Notes (Signed)
[facility]  ROUNDING NOTE   Subjective:   2/19- patient extubated 2/20- 1st HD 2/21-iv lasix- good response 2/23 patient is now reintubated 2/25-sedated with fentanyl, FiO2 35%, TPN 80 cc/h, half-normal saline, no acute events    Objective:   Vitals:   07/10/20 0900 07/10/20 1000  BP: (!) 174/71 138/72  Pulse: 95 (!) 113  Resp: 20   Temp: (!) 97.16 F (36.2 C) (!) 97.34 F (36.3 C)  SpO2: 100% 100%     Weight change: -1.8 kg Filed Weights   07/08/20 0426 07/09/20 0410 07/10/20 0427  Weight: 77.3 kg 78 kg 76.2 kg    Intake/Output: I/O last 3 completed shifts: In: 6712.7 [I.V.:4446.7; NG/GT:1200; IV Piggyback:1066] Out: 8127 [Urine:4375]   Intake/Output this shift:  Total I/O In: 813 [I.V.:413; NG/GT:200; IV Piggyback:200] Out: 325 [Urine:325]  Physical Exam: General: Critically ill  HEENT: NGT in place, mouth is dry  Lungs:  [place] O2, mild coarse breath sounds, vent assisted  Heart: Regular rate and rhythm  Abdomen:  +distended, dressings clean, decreased sounds  Extremities: 2+peripheral edema over thighs. Scrotal edema  Neurologic: Sedated on the vent  Skin: No lesions  Access: None at this time  Foley in place Rectal tube  Basic Metabolic Panel: Recent Labs  Lab 07/06/20 0434 07/07/20 0431 07/08/20 0613 07/09/20 0425 07/09/20 1250 07/09/20 2107 07/10/20 0312  NA 144 146* 151* 157*  --   --  156*  K 3.7 3.1* 3.2* 2.8* 2.9* 3.3* 3.4*  CL 100 102 106 112*  --   --  117*  CO2 _0 32  --   --  29  GLUCOSE 145* 157* 332* 333*  --   --  457*  BUN 73* 88* 96* 95*  --   --  94*  CREATININE 4.68* 4.83* 4.56* 3.69*  --   --  2.88*  CALCIUM 6.8* 6.8* 6.6* 7.0*  --   --  7.2*  MG 2.2 2.1 2.6* 2.4  --  2.1 2.1  PHOS 6.6* 6.8* 8.3* 2.6  --  1.4* 3.4    Liver Function Tests: Recent Labs  Lab 07/04/20 0419 07/05/20 0443 07/06/20 0434 07/08/20 0613  AST 491* 386* 262* 182*  ALT 222* 125* 70* 45*  ALKPHOS 87 122 131* 171*   BILITOT 0.6 1.4* 1.6* 1.9*  PROT 3.9* 4.5* 4.5* 4.6*  ALBUMIN 1.9* 2.0* 2.1* 1.7*   No results for input(s): LIPASE, AMYLASE in the last 168 hours. No results for input(s): AMMONIA in the last 168 hours.  CBC: Recent Labs  Lab 07/06/20 0434 07/07/20 0431 07/08/20 0613 07/09/20 0425 07/10/20 0312  WBC 10.9* 22.3* 20.5* 22.2* 33.7*  NEUTROABS  --   --  15.6*  --   --   HGB 7.0* 9.4* 7.9* 7.6* 8.1*  HCT 21.0* 27.1* 24.3* 21.5* 24.9*  MCV 81.7 80.7 85.3 83.0 86.8  PLT 108* 118* 127* 115* 174    Cardiac Enzymes: No results for input(s): CKTOTAL, CKMB, CKMBINDEX, TROPONINI in the last 168 hours.  BNP: Invalid input(s): POCBNP  CBG: Recent Labs  Lab 07/09/20 1611 07/09/20 1920 07/09/20 2318 07/10/20 0352 07/10/20 0726  GLUCAP 324* 346* 307* 50* 389*    Microbiology: Results for orders placed or performed during the hospital encounter of 06/17/2020  MRSA PCR Screening     Status: None   Collection Time: 07/13/2020  4:32 PM   Specimen: Nasopharyngeal  Result Value Ref Range Status   MRSA by PCR NEGATIVE NEGATIVE Final  Comment:        The GeneXpert MRSA Assay (FDA approved for NASAL specimens only), is one component of a comprehensive MRSA colonization surveillance program. It is not intended to diagnose MRSA infection nor to guide or monitor treatment for MRSA infections. Performed at [facility], [address] [place], [place] [address]   CULTURE, BLOOD (ROUTINE X 2) w Reflex to ID Panel     Status: None   Collection Time: 06/30/2020  6:07 PM   Specimen: BLOOD  Result Value Ref Range Status   Specimen Description BLOOD BLOOD RIGHT HAND  Final   Special Requests   Final    BOTTLES DRAWN AEROBIC AND ANAEROBIC Blood Culture results may not be optimal due to an inadequate volume of blood received in culture bottles   Culture   Final    NO GROWTH 5 DAYS Performed at [facility], [address] [place], [place] [address]    Report  Status 07/06/2020 FINAL  Final  CULTURE, BLOOD (ROUTINE X 2) w Reflex to ID Panel     Status: None   Collection Time: 06/16/2020  6:07 PM   Specimen: BLOOD  Result Value Ref Range Status   Specimen Description BLOOD LEFT THUMB  Final   Special Requests   Final    BOTTLES DRAWN AEROBIC AND ANAEROBIC Blood Culture adequate volume   Culture   Final    NO GROWTH 5 DAYS Performed at [facility], [address], [place], [place] [address]    Report Status 07/06/2020 FINAL  Final  Culture, Respiratory w Gram Stain     Status: None   Collection Time: 07/03/20  2:17 PM   Specimen: Tracheal Aspirate; Respiratory  Result Value Ref Range Status   Specimen Description   Final    TRACHEAL ASPIRATE Performed at [facility], [address] [place], [place] [address]    Special Requests   Final    NONE Performed at [facility], [address] [place], [place] [address]    Gram Stain   Final    FEW WBC PRESENT, PREDOMINANTLY PMN ABUNDANT GRAM NEGATIVE RODS Performed at [facility], [place] [address] [place], [place] [address]    Culture   Final    ABUNDANT ESCHERICHIA COLI Confirmed Extended Spectrum Beta-Lactamase Producer (ESBL).  In bloodstream infections from ESBL organisms, carbapenems are preferred over piperacillin/tazobactam. They are shown to have a lower risk of mortality.    Report Status 07/08/2020 FINAL  Final   Organism ID, Bacteria ESCHERICHIA COLI  Final      Susceptibility   Escherichia coli - MIC*    AMPICILLIN >=32 RESISTANT Resistant     CEFAZOLIN >=64 RESISTANT Resistant     CEFEPIME 8 INTERMEDIATE Intermediate     CEFTAZIDIME RESISTANT Resistant     CEFTRIAXONE >=64 RESISTANT Resistant     CIPROFLOXACIN >=4 RESISTANT Resistant     GENTAMICIN <=1 SENSITIVE Sensitive     IMIPENEM <=0.25 SENSITIVE Sensitive     TRIMETH/SULFA <=20 SENSITIVE Sensitive     AMPICILLIN/SULBACTAM >=32 RESISTANT Resistant     PIP/TAZO <=4 SENSITIVE  Sensitive     * ABUNDANT ESCHERICHIA COLI  Urine Culture     Status: None   Collection Time: 07/07/20 10:43 AM   Specimen: Urine, Random  Result Value Ref Range Status   Specimen Description   Final    URINE, RANDOM Performed at [facility], [address] [place], [place] [address]  Special Requests   Final    NONE Performed at [facility], [address] [place], [place] [address]    Culture   Final    NO GROWTH Performed at [facility], [place] [address] [place], [place] [address]    Report Status 07/08/2020 FINAL  Final  Gastrointestinal Panel by PCR , Stool     Status: None   Collection Time: 07/07/20 12:05 PM   Specimen: Stool  Result Value Ref Range Status   Campylobacter species NOT DETECTED NOT DETECTED Final   Plesimonas shigelloides NOT DETECTED NOT DETECTED Final   Salmonella species NOT DETECTED NOT DETECTED Final   Yersinia enterocolitica NOT DETECTED NOT DETECTED Final   Vibrio species NOT DETECTED NOT DETECTED Final   Vibrio cholerae NOT DETECTED NOT DETECTED Final   Enteroaggregative E coli (EAEC) NOT DETECTED NOT DETECTED Final   Enteropathogenic E coli (EPEC) NOT DETECTED NOT DETECTED Final   Enterotoxigenic E coli (ETEC) NOT DETECTED NOT DETECTED Final   Shiga like toxin producing E coli (STEC) NOT DETECTED NOT DETECTED Final   Shigella/Enteroinvasive E coli (EIEC) NOT DETECTED NOT DETECTED Final   Cryptosporidium NOT DETECTED NOT DETECTED Final   Cyclospora cayetanensis NOT DETECTED NOT DETECTED Final   Entamoeba histolytica NOT DETECTED NOT DETECTED Final   Giardia lamblia NOT DETECTED NOT DETECTED Final   Adenovirus F40/41 NOT DETECTED NOT DETECTED Final   Astrovirus NOT DETECTED NOT DETECTED Final   Norovirus GI/GII NOT DETECTED NOT DETECTED Final   Rotavirus A NOT DETECTED NOT DETECTED Final   Sapovirus (I, II, IV, and V) NOT DETECTED NOT DETECTED Final    Comment: Performed at [facility], [address] [place], [place] [address]  C Difficile Quick Screen w PCR reflex     Status: None   Collection Time: 07/07/20 12:05 PM  Result Value Ref Range Status   C Diff antigen NEGATIVE NEGATIVE Final   C Diff toxin NEGATIVE NEGATIVE Final   C Diff interpretation No C. difficile detected.  Final    Comment: Performed at [facility], [address] [place], [place] [address]  CULTURE, BLOOD (ROUTINE X 2) w Reflex to ID Panel     Status: None (Preliminary result)   Collection Time: 07/07/20  1:38 PM   Specimen: BLOOD  Result Value Ref Range Status   Specimen Description BLOOD BLOOD LEFT HAND  Final   Special Requests   Final    BOTTLES DRAWN AEROBIC AND ANAEROBIC Blood Culture adequate volume   Culture   Final    NO GROWTH 3 DAYS Performed at [facility], [address], [place], [place] [address]    Report Status PENDING  Incomplete  CULTURE, BLOOD (ROUTINE X 2) w Reflex to ID Panel     Status: None (Preliminary result)   Collection Time: 07/07/20  1:38 PM   Specimen: BLOOD  Result Value Ref Range Status   Specimen Description BLOOD BLOOD RIGHT HAND  Final   Special Requests   Final    BOTTLES DRAWN AEROBIC AND ANAEROBIC Blood Culture adequate volume   Culture   Final    NO GROWTH 3 DAYS Performed at [facility], [place], [place] [address]    Report Status PENDING  Incomplete  SARS CORONAVIRUS 2 (TAT 6-24 HRS) Nasopharyngeal Nasopharyngeal Swab     Status: None   Collection Time: 07/07/20  2:10 PM   Specimen: Nasopharyngeal Swab  Result Value Ref Range Status  SARS Coronavirus 2 NEGATIVE NEGATIVE Final    Comment: (NOTE) SARS-CoV-2 target nucleic acids are NOT DETECTED.  The SARS-CoV-2 RNA is generally detectable in upper and lower respiratory specimens during the acute phase of infection. Negative results do not preclude SARS-CoV-2 infection, do not rule out co-infections with other pathogens, and should not be used as the sole  basis for treatment or other patient management decisions. Negative results must be combined with clinical observations, patient history, and epidemiological information. The expected result is Negative.  Fact Sheet for Patients: SugarRoll.be  Fact Sheet for Healthcare Providers: https://www.woods-mathews.com/  This test is not yet approved or cleared by the [place] FDA and  has been authorized for detection and/or diagnosis of SARS-CoV-2 by FDA under an Emergency Use Authorization (EUA). This EUA will remain  in effect (meaning this test can be used) for the duration of the COVID-19 declaration under Se ction 564(b)(1) of the Act, 21 U.S.C. section 360bbb-3(b)(1), unless the authorization is terminated or revoked sooner.  Performed at [facility], [place] [address], [place], [place] [address]     Coagulation Studies: Recent Labs    07/08/20 7096  LABPROT 17.7*  INR 1.5*    Urinalysis: Recent Labs    07/07/20 1043  COLORURINE YELLOW*  LABSPEC 1.012  PHURINE 5.0  GLUCOSEU 150*  HGBUR LARGE*  BILIRUBINUR NEGATIVE  KETONESUR NEGATIVE  PROTEINUR 30*  NITRITE NEGATIVE  LEUKOCYTESUR NEGATIVE      Imaging: CT ABDOMEN PELVIS WO CONTRAST  Result Date: 07/08/2020 CLINICAL DATA:  Pelvic free fluid on FAST scan performed earlier today during code EXAM: CT ABDOMEN AND PELVIS WITHOUT CONTRAST TECHNIQUE: Multidetector CT imaging of the abdomen and pelvis was performed following the standard protocol without IV contrast. Unenhanced CT was performed per clinician order. Lack of IV contrast limits sensitivity and specificity, especially for evaluation of abdominal/pelvic solid viscera. COMPARISON:  07/08/2020, 06/25/2020 FINDINGS: Lower chest: There are bilateral pleural effusions, left greater than right, volume estimated less than 1 L on the left. Bilateral lower lobe consolidation, with dense left lower lobe consolidation likely  representing atelectasis. Diffuse airspace disease throughout the right lower lobe could reflect infection or aspiration. No pneumothorax. Unenhanced CT was performed per clinician order. Lack of IV contrast limits sensitivity and specificity, especially for evaluation of abdominal/pelvic solid viscera. Hepatobiliary: High attenuation material within the gallbladder may reflect sludge. No evidence of acute cholecystitis. Unenhanced imaging of the liver is unremarkable. Pancreas: Unremarkable. No pancreatic ductal dilatation or surrounding inflammatory changes. Spleen: Normal in size without focal abnormality. Adrenals/Urinary Tract: No urinary tract calculi or obstructive uropathy. Stable hilar vascular calcifications. Bladder is decompressed with a Foley catheter. The adrenals are unremarkable. Stomach/Bowel: No evidence of high-grade bowel obstruction. Mildly distended loops of jejunum within the left mid abdomen, with scattered gas fluid levels, likely reflect postoperative ileus. Normal retrocecal appendix. Enteric catheter extends through the stomach, tip in the proximal duodenum. Vascular/Lymphatic: Evaluation of the vessels is limited without intravenous contrast. Postsurgical changes are seen from interval aortobifemoral bypass procedure. Extensive atherosclerosis within the native aorta and its branches. Retroperitoneal fluid and fat stranding likely reflects postoperative change given recent surgical procedure 1 week ago. No pathologic adenopathy within the abdomen or pelvis. Reproductive: Prostate is unremarkable. Other: Trace free fluid within the upper abdomen and bilateral flanks. There is minimal higher attenuation fluid within the left lower quadrant which could reflect blood products, compatible with recent surgery. No free gas. Postsurgical changes from midline laparotomy. There is mild diffuse  subcutaneous edema. Musculoskeletal: No acute or destructive bony lesions. Reconstructed images  demonstrate no additional findings. IMPRESSION: 1. Limited evaluation of the postoperative abdomen without intravenous or oral contrast. 2. Postsurgical changes from recent aortobifemoral bypass procedure, with small amount of free intraperitoneal fluid and retroperitoneal fluid as above. Fluid in the left lower quadrant is mildly increased in attenuation, consistent with blood products. This is most likely related to recent surgical intervention. Evaluation of the vascular lumen and bypass grafts cannot be assessed without IV contrast. 3. Bilateral pleural effusions with bibasilar consolidation, left greater than right. Findings are consistent with a combination of airspace disease and atelectasis. 4. Mild distension of the proximal jejunum with scattered gas fluid levels, most consistent with ileus. No evidence of high-grade obstruction. 5.  Aortic Atherosclerosis (ICD10-I70.0). Electronically Signed   By: Randa Ngo M.D.   On: 07/08/2020 15:27   ECHOCARDIOGRAM LIMITED  Result Date: 07/08/2020    ECHOCARDIOGRAM LIMITED REPORT   Patient Name:   Ivan Larsen [facility] Date of Exam: 07/08/2020 Medical Rec #:  [id number]         Height:       65.0 in Accession #:    [id number]        Weight:       170.4 lb Date of Birth:  June 01, 1955         BSA:          1.848 m Patient Age:    65 years          BP:           111/60 mmHg Patient Gender: M                 HR:           99 bpm. Exam Location:  [facility] Procedure: Limited Echo, Cardiac Doppler and Color Doppler Indications:     Cardiac Arrest I46.9  History:         Patient has prior history of Echocardiogram examinations, most                  recent 07/03/2020. Previous Myocardial Infarction and CAD; Risk                  Factors:Dyslipidemia and Diabetes.  Sonographer:     Sherrie Sport RDCS (AE) Referring Phys:  [id number] DONALD BRESCIA Diagnosing Phys: Bartholome Bill MD  Sonographer Comments: Echo performed with patient supine and on artificial respirator, suboptimal  parasternal window, suboptimal apical window and no subcostal window. IMPRESSIONS  1. Left ventricular ejection fraction, by estimation, is 55 to 60%. Left ventricular ejection fraction by PLAX is 56 %. The left ventricle has normal function. The left ventricle has no regional wall motion abnormalities.  2. Right ventricular systolic function was not well visualized. The right ventricular size is not well visualized.  3. The mitral valve was not well visualized. Trivial mitral valve regurgitation.  4. The aortic valve was not well visualized. Aortic valve regurgitation is trivial. FINDINGS  Left Ventricle: Left ventricular ejection fraction, by estimation, is 55 to 60%. Left ventricular ejection fraction by PLAX is 56 %. The left ventricle has normal function. The left ventricle has no regional wall motion abnormalities. The left ventricular internal cavity size was normal in size. There is no left ventricular hypertrophy. Right Ventricle: The right ventricular size is not well visualized. Right vetricular wall thickness was not well visualized. Right ventricular systolic function was not well visualized. Left Atrium: Left atrial size  was normal in size. Right Atrium: Right atrial size was normal in size. Pericardium: There is no evidence of pericardial effusion. Mitral Valve: The mitral valve was not well visualized. Trivial mitral valve regurgitation. Tricuspid Valve: The tricuspid valve is not well visualized. Tricuspid valve regurgitation is trivial. Aortic Valve: The aortic valve was not well visualized. Aortic valve regurgitation is trivial. Pulmonic Valve: The pulmonic valve was not assessed. Aorta: The aortic root was not well visualized. IAS/Shunts: The interatrial septum was not well visualized. LEFT VENTRICLE PLAX 2D LV EF:         Left ventricular ejection fraction by PLAX is 56 %. LVIDd:         3.43 cm LVIDs:         2.45 cm LV PW:         1.27 cm LV IVS:        1.07 cm  LEFT ATRIUM           Index        RIGHT ATRIUM           Index LA diam:      3.40 cm 1.84 cm/m  RA Area:     13.00 cm LA Vol (A4C): 28.2 ml 15.26 ml/m RA Volume:   30.90 ml  16.72 ml/m   AORTA Ao Root diam: 1.40 cm Bartholome Bill MD Electronically signed by Bartholome Bill MD Signature Date/Time: 07/08/2020/4:36:00 PM    Final      Medications:   . sodium chloride 10 mL/hr at 07/10/20 1000  . sodium chloride    . anticoagulant sodium citrate    . fentaNYL infusion INTRAVENOUS 200 mcg/hr (07/10/20 1000)  . magnesium sulfate bolus IVPB    . piperacillin-tazobactam (ZOSYN)  IV    . potassium chloride 10 mEq (07/10/20 1019)  . TPN ADULT (ION) 80 mL/hr at 07/10/20 1000   . sodium chloride   Intravenous Once  . sodium chloride   Intravenous Once  . aspirin EC  81 mg Oral Q0600  . chlorhexidine gluconate (MEDLINE KIT)  15 mL Mouth Rinse BID  . Chlorhexidine Gluconate Cloth  6 each Topical Once  . collagenase  1 application Topical Daily  . docusate  100 mg Per Tube Daily  . feeding supplement (VITAL 1.5 CAL)  1,000 mL Per Tube Q24H  . free water  200 mL Per Tube Q4H  . hydrocortisone sod succinate (SOLU-CORTEF) inj  50 mg Intravenous Q8H  . insulin aspart  0-20 Units Subcutaneous Q4H  . insulin aspart  3 Units Subcutaneous Q4H  . insulin detemir  30 Units Subcutaneous BID  . mouth rinse  15 mL Mouth Rinse 10 times per day  . midodrine  10 mg Per Tube TID WC  . pantoprazole (PROTONIX) IV  40 mg Intravenous Q12H   sodium chloride, acetaminophen **OR** acetaminophen, alum & mag hydroxide-simeth, anticoagulant sodium citrate, magnesium sulfate bolus IVPB, morphine injection, oxyCODONE-acetaminophen, phenol, senna-docusate, sorbitol, white petrolatum  Assessment/ Plan:  Mr. ORVIS STANN is a 65 y.o. white male with peripheral vascular disease, coronary artery disease, nephrolithiasis, hyperlipidemia, diabetes mellitus type II, who was admitted to [facility] on 07/10/2020 for Atherosclerosis of artery of extremity with ulceration  ([place]) [I70.299, A07.622] S/P aorto-bifemoral bypass surgery [Q33.354] Underwent bilateral femoral artery bypass graft by Dr. Lucky Cowboy and Dr. Delana Meyer. on 07/02/2020   #. Acute renal failure : with baseline creatinine of 0.9 with normal GFR on 06/25/20.  History of glycosuria and underlying diabetic kidney disease.  AKI  likely ATN due to hypotension  02/24 0701 - 02/25 0700 In: 5665.1 [I.V.:3552.2; NG/GT:1200; IV Piggyback:912.9] Out: 3075 [Urine:3075]  Lab Results  Component Value Date   CREATININE 2.88 (H) 07/10/2020   CREATININE 3.69 (H) 07/09/2020   CREATININE 4.56 (H) 07/08/2020    Continues to have large amount of edema Patient pulled out HD catheter, Will monitor closely for the need for HD  S Creatinine trends are starting to improve and urine output has also improved  Continue supportive care and avoid hypotension    #Acute respiratory failure Requiring ventilator support  # hypokalemia Being supplemented as per ICU protocol  # Hypernatremia -Levels are worse again Consider discontinuing half-normal saline.  TPN being adjusted  # Generalized edema Iv lasix x 1, good response Repeat dose today    LOS: 9 Sherida Dobkins 2/25/202210:29 AM

## 2020-07-11 ENCOUNTER — Inpatient Hospital Stay: Payer: Medicare Other

## 2020-07-11 ENCOUNTER — Inpatient Hospital Stay: Payer: Self-pay

## 2020-07-11 DIAGNOSIS — I468 Cardiac arrest due to other underlying condition: Secondary | ICD-10-CM | POA: Diagnosis not present

## 2020-07-11 DIAGNOSIS — J9601 Acute respiratory failure with hypoxia: Secondary | ICD-10-CM | POA: Diagnosis not present

## 2020-07-11 DIAGNOSIS — N171 Acute kidney failure with acute cortical necrosis: Secondary | ICD-10-CM | POA: Diagnosis not present

## 2020-07-11 DIAGNOSIS — Z978 Presence of other specified devices: Secondary | ICD-10-CM | POA: Diagnosis not present

## 2020-07-11 LAB — CBC
HCT: 29.2 % — ABNORMAL LOW (ref 39.0–52.0)
HCT: 29.5 % — ABNORMAL LOW (ref 39.0–52.0)
Hemoglobin: 9.1 g/dL — ABNORMAL LOW (ref 13.0–17.0)
Hemoglobin: 9.5 g/dL — ABNORMAL LOW (ref 13.0–17.0)
MCH: 27.8 pg (ref 26.0–34.0)
MCH: 28.9 pg (ref 26.0–34.0)
MCHC: 31.2 g/dL (ref 30.0–36.0)
MCHC: 32.2 g/dL (ref 30.0–36.0)
MCV: 89.3 fL (ref 80.0–100.0)
MCV: 89.7 fL (ref 80.0–100.0)
Platelets: 190 10*3/uL (ref 150–400)
Platelets: 182 10*3/uL (ref 150–400)
RBC: 3.27 MIL/uL — ABNORMAL LOW (ref 4.22–5.81)
RBC: 3.29 MIL/uL — ABNORMAL LOW (ref 4.22–5.81)
RDW: 22.7 % — ABNORMAL HIGH (ref 11.5–15.5)
RDW: 23.1 % — ABNORMAL HIGH (ref 11.5–15.5)
WBC: 56.7 10*3/uL (ref 4.0–10.5)
WBC: 54.2 10*3/uL (ref 4.0–10.5)
nRBC: 1 % — ABNORMAL HIGH (ref 0.0–0.2)
nRBC: 1 % — ABNORMAL HIGH (ref 0.0–0.2)

## 2020-07-11 LAB — CBC WITH DIFFERENTIAL/PLATELET
Abs Immature Granulocytes: 1.72 10*3/uL — ABNORMAL HIGH (ref 0.00–0.07)
Basophils Absolute: 0.2 10*3/uL — ABNORMAL HIGH (ref 0.0–0.1)
Basophils Relative: 0 %
Eosinophils Absolute: 0 10*3/uL (ref 0.0–0.5)
Eosinophils Relative: 0 %
HCT: 29.1 % — ABNORMAL LOW (ref 39.0–52.0)
Hemoglobin: 9.1 g/dL — ABNORMAL LOW (ref 13.0–17.0)
Immature Granulocytes: 3 %
Lymphocytes Relative: 1 %
Lymphs Abs: 0.7 10*3/uL (ref 0.7–4.0)
MCH: 28.1 pg (ref 26.0–34.0)
MCHC: 31.3 g/dL (ref 30.0–36.0)
MCV: 89.8 fL (ref 80.0–100.0)
Monocytes Absolute: 1.6 10*3/uL — ABNORMAL HIGH (ref 0.1–1.0)
Monocytes Relative: 3 %
Neutro Abs: 53.3 10*3/uL — ABNORMAL HIGH (ref 1.7–7.7)
Neutrophils Relative %: 93 %
Platelets: 187 10*3/uL (ref 150–400)
RBC: 3.24 MIL/uL — ABNORMAL LOW (ref 4.22–5.81)
RDW: 22.8 % — ABNORMAL HIGH (ref 11.5–15.5)
Smear Review: NORMAL
WBC: 57.5 10*3/uL (ref 4.0–10.5)
nRBC: 1 % — ABNORMAL HIGH (ref 0.0–0.2)

## 2020-07-11 LAB — BLOOD GAS, VENOUS
Acid-Base Excess: 5.6 mmol/L — ABNORMAL HIGH (ref 0.0–2.0)
Bicarbonate: 29.8 mmol/L — ABNORMAL HIGH (ref 20.0–28.0)
FIO2: 35
Mechanical Rate: 20
O2 Saturation: 88.1 %
PEEP: 10 cmH2O
Patient temperature: 37
Pressure control: 10 cmH2O
pCO2, Ven: 41 mmHg — ABNORMAL LOW (ref 44.0–60.0)
pH, Ven: 7.47 — ABNORMAL HIGH (ref 7.250–7.430)
pO2, Ven: 51 mmHg — ABNORMAL HIGH (ref 32.0–45.0)

## 2020-07-11 LAB — MAGNESIUM: Magnesium: 2 mg/dL (ref 1.7–2.4)

## 2020-07-11 LAB — COMPREHENSIVE METABOLIC PANEL
ALT: 20 U/L (ref 0–44)
AST: 60 U/L — ABNORMAL HIGH (ref 15–41)
Albumin: 2.2 g/dL — ABNORMAL LOW (ref 3.5–5.0)
Alkaline Phosphatase: 112 U/L (ref 38–126)
Anion gap: 10 (ref 5–15)
BUN: 84 mg/dL — ABNORMAL HIGH (ref 8–23)
CO2: 28 mmol/L (ref 22–32)
Calcium: 7.6 mg/dL — ABNORMAL LOW (ref 8.9–10.3)
Chloride: 123 mmol/L — ABNORMAL HIGH (ref 98–111)
Creatinine, Ser: 2.12 mg/dL — ABNORMAL HIGH (ref 0.61–1.24)
GFR, Estimated: 34 mL/min — ABNORMAL LOW (ref >60)
Glucose, Bld: 174 mg/dL — ABNORMAL HIGH (ref 70–99)
Potassium: 3.2 mmol/L — ABNORMAL LOW (ref 3.5–5.1)
Sodium: 161 mmol/L (ref 135–145)
Total Bilirubin: 1.4 mg/dL — ABNORMAL HIGH (ref 0.3–1.2)
Total Protein: 4.9 g/dL — ABNORMAL LOW (ref 6.5–8.1)

## 2020-07-11 LAB — BLOOD GAS, ARTERIAL
Acid-Base Excess: 7 mmol/L — ABNORMAL HIGH (ref 0.0–2.0)
Bicarbonate: 29.7 mmol/L — ABNORMAL HIGH (ref 20.0–28.0)
FIO2: 35
Mechanical Rate: 20
O2 Saturation: 98.8 %
PEEP: 10 cmH2O
Patient temperature: 37
Pressure control: 10 cmH2O
pCO2 arterial: 34 mmHg (ref 32.0–48.0)
pH, Arterial: 7.55 — ABNORMAL HIGH (ref 7.350–7.450)
pO2, Arterial: 108 mmHg (ref 83.0–108.0)

## 2020-07-11 LAB — SODIUM: Sodium: 160 mmol/L — ABNORMAL HIGH (ref 135–145)

## 2020-07-11 LAB — GLUCOSE, CAPILLARY
Glucose-Capillary: 168 mg/dL — ABNORMAL HIGH (ref 70–99)
Glucose-Capillary: 170 mg/dL — ABNORMAL HIGH (ref 70–99)
Glucose-Capillary: 165 mg/dL — ABNORMAL HIGH (ref 70–99)
Glucose-Capillary: 193 mg/dL — ABNORMAL HIGH (ref 70–99)
Glucose-Capillary: 353 mg/dL — ABNORMAL HIGH (ref 70–99)
Glucose-Capillary: 373 mg/dL — ABNORMAL HIGH (ref 70–99)
Glucose-Capillary: 326 mg/dL — ABNORMAL HIGH (ref 70–99)
Glucose-Capillary: 354 mg/dL — ABNORMAL HIGH (ref 70–99)
Glucose-Capillary: 313 mg/dL — ABNORMAL HIGH (ref 70–99)

## 2020-07-11 LAB — BASIC METABOLIC PANEL
Anion gap: 9 (ref 5–15)
Anion gap: 11 (ref 5–15)
BUN: 87 mg/dL — ABNORMAL HIGH (ref 8–23)
BUN: 93 mg/dL — ABNORMAL HIGH (ref 8–23)
CO2: 30 mmol/L (ref 22–32)
CO2: 27 mmol/L (ref 22–32)
Calcium: 7.6 mg/dL — ABNORMAL LOW (ref 8.9–10.3)
Calcium: 7.4 mg/dL — ABNORMAL LOW (ref 8.9–10.3)
Chloride: 124 mmol/L — ABNORMAL HIGH (ref 98–111)
Chloride: 121 mmol/L — ABNORMAL HIGH (ref 98–111)
Creatinine, Ser: 2.13 mg/dL — ABNORMAL HIGH (ref 0.61–1.24)
Creatinine, Ser: 2.45 mg/dL — ABNORMAL HIGH (ref 0.61–1.24)
GFR, Estimated: 34 mL/min — ABNORMAL LOW (ref >60)
GFR, Estimated: 28 mL/min — ABNORMAL LOW (ref >60)
Glucose, Bld: 187 mg/dL — ABNORMAL HIGH (ref 70–99)
Glucose, Bld: 336 mg/dL — ABNORMAL HIGH (ref 70–99)
Potassium: 3.6 mmol/L (ref 3.5–5.1)
Potassium: 3.5 mmol/L (ref 3.5–5.1)
Sodium: 163 mmol/L (ref 135–145)
Sodium: 159 mmol/L — ABNORMAL HIGH (ref 135–145)

## 2020-07-11 LAB — PHOSPHORUS: Phosphorus: 2 mg/dL — ABNORMAL LOW (ref 2.5–4.6)

## 2020-07-11 IMAGING — DX DG CHEST 1V PORT
1 series · 1 of 1 positions shown · non-contrast
Comparison: [DATE]

CLINICAL DATA: Hypoxia

EXAM:
PORTABLE CHEST 1 VIEW

[chest ap]
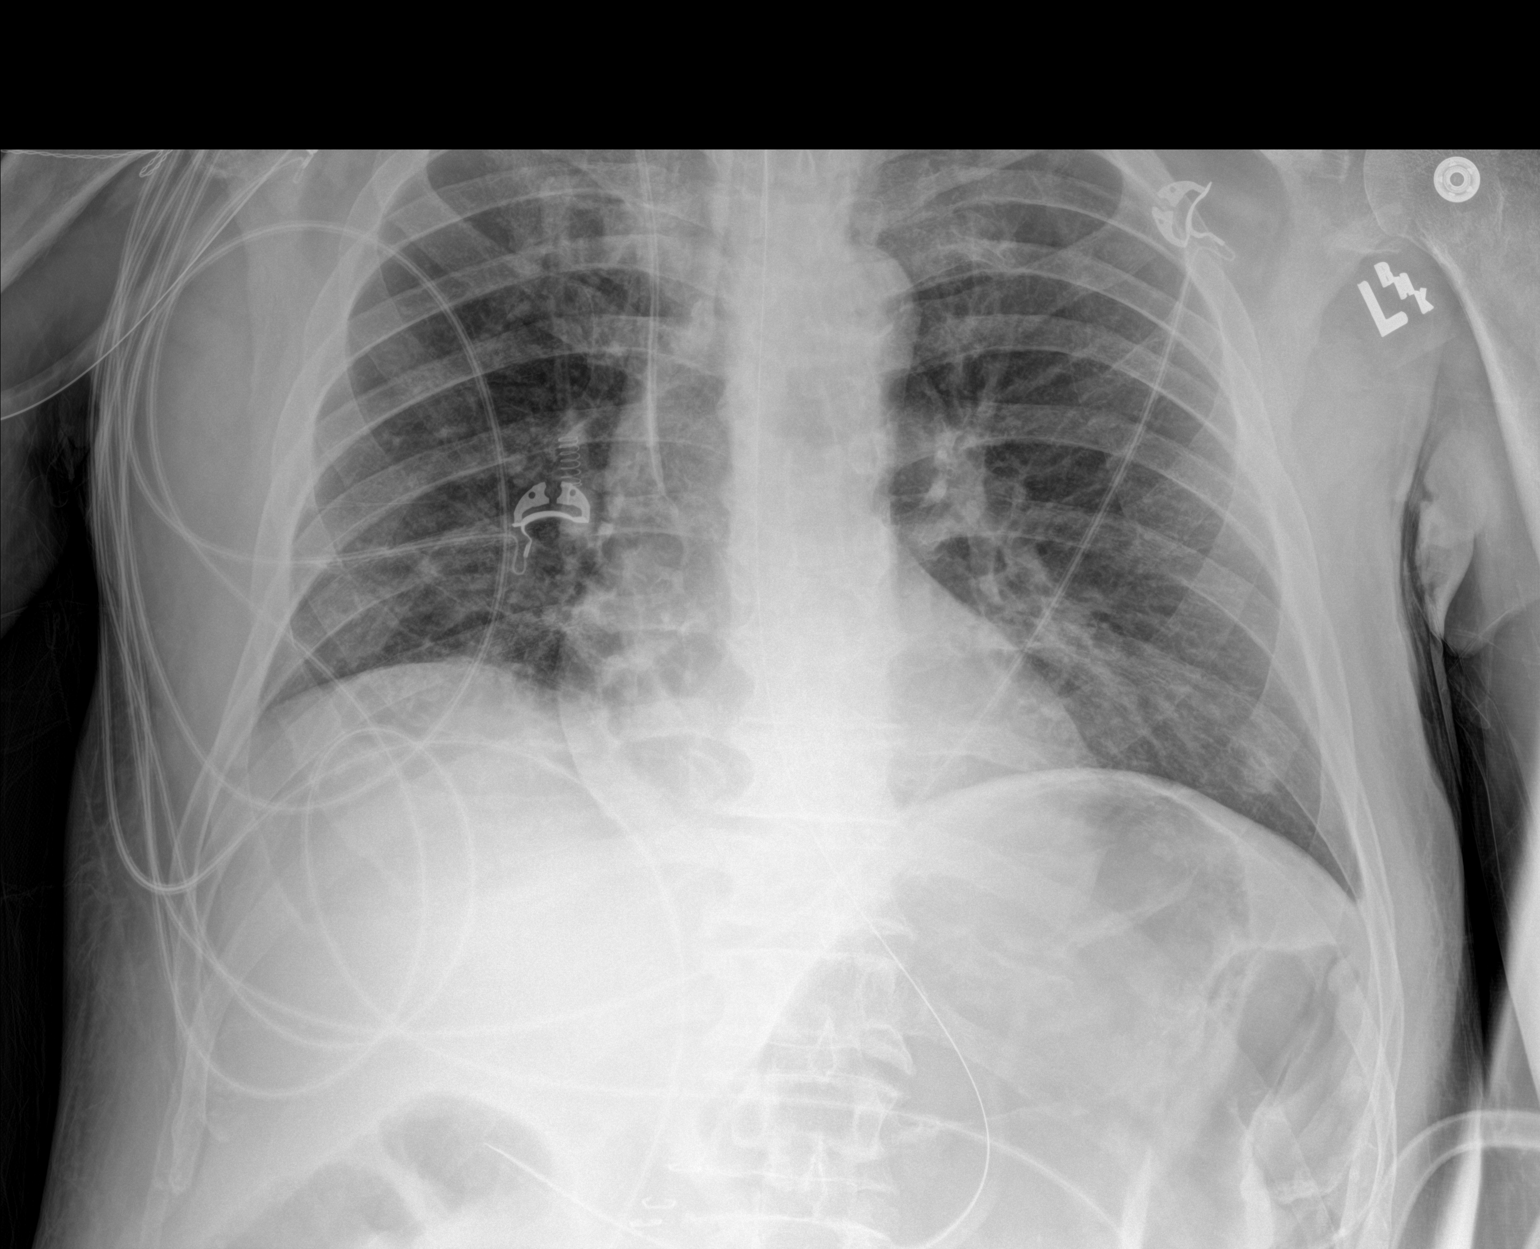

[1 of 1 positions shown; findings below may reference images not displayed]

FINDINGS: Endotracheal tube is seen 6.1 cm above the carina. Nasogastric tube
extends into the expected distal body of the stomach. Right internal
jugular central venous catheter tip is unchanged within the superior
vena cava.

Lung volumes are small but appears stable since prior examination.
Minimal left basilar atelectasis. No pneumothorax or pleural
effusion. Cardiac size within normal limits.
IMPRESSION: Stable support lines and tubes.

Pulmonary hypoinflation, unchanged.

## 2020-07-11 MED ORDER — SODIUM CHLORIDE 0.9 % IV SOLN
2.0000 g | INTRAVENOUS | Status: DC
  Administered 2020-07-11: 2 g via INTRAVENOUS
  Filled 2020-07-11 (×2): qty 2

## 2020-07-11 MED ORDER — VANCOMYCIN 50 MG/ML ORAL SOLUTION
125.0000 mg | Freq: Four times a day (QID) | ORAL | Status: DC
  Administered 2020-07-11 – 2020-07-13 (×7): 125 mg via ORAL
  Filled 2020-07-11 (×11): qty 2.5

## 2020-07-11 MED ORDER — HYDROCORTISONE NA SUCCINATE PF 100 MG IJ SOLR
50.0000 mg | Freq: Two times a day (BID) | INTRAMUSCULAR | Status: DC
  Administered 2020-07-12 – 2020-07-14 (×5): 50 mg via INTRAVENOUS
  Filled 2020-07-11 (×5): qty 2

## 2020-07-11 MED ORDER — INSULIN ASPART 100 UNIT/ML ~~LOC~~ SOLN
5.0000 [IU] | SUBCUTANEOUS | Status: DC
  Administered 2020-07-11 (×4): 5 [IU] via SUBCUTANEOUS
  Filled 2020-07-11 (×4): qty 1

## 2020-07-11 MED ORDER — DEXTROSE 5 % IV SOLN: INTRAVENOUS | Status: AC

## 2020-07-11 MED ORDER — SODIUM CHLORIDE 0.9 % IV SOLN
100.0000 mg | INTRAVENOUS | Status: DC
  Administered 2020-07-12 – 2020-07-13 (×2): 100 mg via INTRAVENOUS
  Filled 2020-07-11 (×3): qty 100

## 2020-07-11 MED ORDER — INSULIN DETEMIR 100 UNIT/ML ~~LOC~~ SOLN
0.3000 [IU]/kg | SUBCUTANEOUS | Status: DC
  Administered 2020-07-11: 23 [IU] via SUBCUTANEOUS
  Filled 2020-07-11 (×2): qty 0.23

## 2020-07-11 MED ORDER — TRACE MINERALS CU-MN-SE-ZN 300-55-60-3000 MCG/ML IV SOLN
INTRAVENOUS | Status: AC
  Filled 2020-07-11: qty 561.6

## 2020-07-11 MED ORDER — DEXTROSE 10 % IV SOLN: INTRAVENOUS | Status: DC

## 2020-07-11 MED ORDER — METRONIDAZOLE IN NACL 5-0.79 MG/ML-% IV SOLN
500.0000 mg | Freq: Three times a day (TID) | INTRAVENOUS | Status: DC
  Administered 2020-07-11 – 2020-07-12 (×2): 500 mg via INTRAVENOUS
  Filled 2020-07-11 (×5): qty 100

## 2020-07-11 MED ORDER — SODIUM CHLORIDE 0.9 % IV SOLN
200.0000 mg | Freq: Once | INTRAVENOUS | Status: AC
  Administered 2020-07-11: 200 mg via INTRAVENOUS
  Filled 2020-07-11: qty 200

## 2020-07-11 MED ORDER — DEXTROSE 5 % IV SOLN: INTRAVENOUS | Status: DC

## 2020-07-11 MED ORDER — INSULIN ASPART 100 UNIT/ML ~~LOC~~ SOLN
0.0000 [IU] | SUBCUTANEOUS | Status: DC
  Administered 2020-07-11: 4 [IU] via SUBCUTANEOUS
  Administered 2020-07-11: 20 [IU] via SUBCUTANEOUS
  Administered 2020-07-11: 15 [IU] via SUBCUTANEOUS
  Administered 2020-07-11: 20 [IU] via SUBCUTANEOUS
  Filled 2020-07-11 (×4): qty 1

## 2020-07-11 MED ORDER — HYDROMORPHONE HCL 1 MG/ML IJ SOLN
1.0000 mg | Freq: Once | INTRAMUSCULAR | Status: AC
  Administered 2020-07-11: 1 mg via INTRAVENOUS
  Filled 2020-07-11: qty 1

## 2020-07-11 MED ORDER — SODIUM CHLORIDE 0.9 % IV SOLN
1.0000 mg/h | INTRAVENOUS | Status: DC
  Administered 2020-07-11: 1 mg/h via INTRAVENOUS
  Administered 2020-07-12 – 2020-07-14 (×2): 2 mg/h via INTRAVENOUS
  Filled 2020-07-11 (×3): qty 5

## 2020-07-11 MED ORDER — NOREPINEPHRINE 16 MG/250ML-% IV SOLN
0.0000 ug/min | INTRAVENOUS | Status: DC
  Administered 2020-07-11: 2 ug/min via INTRAVENOUS
  Filled 2020-07-11: qty 250

## 2020-07-11 MED ORDER — DEXTROSE-NACL 5-0.45 % IV SOLN: INTRAVENOUS | Status: DC

## 2020-07-11 MED ORDER — POLYVINYL ALCOHOL 1.4 % OP SOLN
1.0000 [drp] | OPHTHALMIC | Status: DC | PRN
  Administered 2020-07-11: 1 [drp] via OPHTHALMIC
  Filled 2020-07-11: qty 15

## 2020-07-11 MED ORDER — PROPOFOL 1000 MG/100ML IV EMUL
5.0000 ug/kg/min | INTRAVENOUS | Status: DC
  Administered 2020-07-12 (×2): 5 ug/kg/min via INTRAVENOUS
  Administered 2020-07-13 – 2020-07-14 (×2): 10 ug/kg/min via INTRAVENOUS
  Filled 2020-07-11 (×5): qty 100

## 2020-07-11 MED ORDER — FREE WATER: 300.0000 mL | Status: DC
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
## 2020-07-11 NOTE — Progress Notes (Signed)
Subjective  - POD #10, s/p ABF  Intubated and sedated Gets agitated with sedation wean, no purposuful movements   Physical Exam:  Abd soft Does not awake to command or sternal rub Extremities warm       Assessment/Plan:  POD #10   Pulm:  Continue ventilator support, amy need trach.  Did not tolerate vent wean Cardiac:  Elevated troponin, likely from chest compressions.  May benefit from cardiology consult Renal:  Good UOP, creatinine improving ID:  WBC very elevated,? seconday to steroids.  CDIFF negative.  Abd CT not concerning for bowel ischemia.  Continue IV abx GI:  Not tolerating tube feeds, currently on hold Hypernatremia:  Receive free water bolus, but currently on hold as he was not tolerating them   Ivan Larsen 07/11/2020 6:44 PM --  Vitals:   07/11/20 1700 07/11/20 1800  BP: (!) 105/55 126/63  Pulse: (!) 101 (!) 108  Resp: (!) 21 (!) 22  Temp: 98.96 F (37.2 C) 98.96 F (37.2 C)  SpO2: 99% 99%    Intake/Output Summary (Last 24 hours) at 07/11/2020 1844 Last data filed at 07/11/2020 1800 Gross per 24 hour  Intake 5539.99 ml  Output 3100 ml  Net 2439.99 ml     Laboratory CBC    Component Value Date/Time   WBC 54.2 (HH) 07/11/2020 0933   HGB 9.5 (L) 07/11/2020 0933   HGB 14.7 06/04/2020 1441   HCT 29.5 (L) 07/11/2020 0933   HCT 45.6 06/04/2020 1441   PLT 182 07/11/2020 0933   PLT 320 06/04/2020 1441    BMET    Component Value Date/Time   NA 160 (H) 07/11/2020 0933   NA 159 (H) 07/11/2020 0933   NA 140 06/02/2020 1347   NA 141 07/03/2012 0511   K 3.5 07/11/2020 0933   K 4.0 07/03/2012 0511   CL 121 (H) 07/11/2020 0933   CL 108 (H) 07/03/2012 0511   CO2 27 07/11/2020 0933   CO2 24 07/03/2012 0511   GLUCOSE 336 (H) 07/11/2020 0933   GLUCOSE 185 (H) 07/03/2012 0511   BUN 93 (H) 07/11/2020 0933   BUN 24 06/02/2020 1347   BUN 14 07/03/2012 0511   CREATININE 2.45 (H) 07/11/2020 0933   CREATININE 0.86 07/03/2012 0511   CALCIUM  7.4 (L) 07/11/2020 0933   CALCIUM 8.1 (L) 07/03/2012 0511   GFRNONAA 28 (L) 07/11/2020 0933   GFRNONAA >60 07/03/2012 0511   GFRAA 76 06/02/2020 1347   GFRAA >60 07/03/2012 0511    COAG Lab Results  Component Value Date   INR 1.5 (H) 07/08/2020   INR 1.0 06/30/2020   INR 1.0 07/02/2012   No results found for: PTT  Antibiotics Anti-infectives (From admission, onward)   Start     Dose/Rate Route Frequency Ordered Stop   07/12/20 1815  anidulafungin (ERAXIS) 100 mg in sodium chloride 0.9 % 100 mL IVPB       "Followed by" Linked Group Details   100 mg 78 mL/hr over 100 Minutes Intravenous Every 24 hours 07/11/20 1717     07/11/20 1815  anidulafungin (ERAXIS) 200 mg in sodium chloride 0.9 % 200 mL IVPB       "Followed by" Linked Group Details   200 mg 78 mL/hr over 200 Minutes Intravenous  Once 07/11/20 1717     07/11/20 1800  ceFEPIme (MAXIPIME) 2 g in sodium chloride 0.9 % 100 mL IVPB        2 g 200 mL/hr over 30 Minutes  Intravenous Every 24 hours 07/11/20 1714 07/18/20 1759   07/11/20 1800  metroNIDAZOLE (FLAGYL) IVPB 500 mg        500 mg 100 mL/hr over 60 Minutes Intravenous Every 8 hours 07/11/20 1714 07/18/20 1759   07/11/20 1800  vancomycin (VANCOCIN) 50 mg/mL oral solution 125 mg        125 mg Oral 4 times daily 07/11/20 1714 07/21/20 1759   07/10/20 1200  piperacillin-tazobactam (ZOSYN) IVPB 3.375 g  Status:  Discontinued        3.375 g 12.5 mL/hr over 240 Minutes Intravenous Every 8 hours 07/10/20 0815 07/11/20 1714   07/08/20 1400  piperacillin-tazobactam (ZOSYN) IVPB 2.25 g  Status:  Discontinued        2.25 g 100 mL/hr over 30 Minutes Intravenous Every 8 hours 07/08/20 1257 07/10/20 0815   07/07/20 1400  ceFEPIme (MAXIPIME) 1 g in sodium chloride 0.9 % 100 mL IVPB  Status:  Discontinued        1 g 200 mL/hr over 30 Minutes Intravenous Every 8 hours 07/07/20 0940 07/07/20 1003   07/07/20 1200  vancomycin (VANCOCIN) 50 mg/mL oral solution 125 mg  Status:   Discontinued        125 mg Oral Every 6 hours 07/07/20 0938 07/07/20 0940   07/07/20 1200  vancomycin (VANCOCIN) 500 mg in sodium chloride irrigation 0.9 % 100 mL ENEMA  Status:  Discontinued        500 mg Rectal Every 6 hours 07/07/20 0951 07/08/20 0858   07/07/20 1200  ceFEPIme (MAXIPIME) 2 g in sodium chloride 0.9 % 100 mL IVPB  Status:  Discontinued        2 g 200 mL/hr over 30 Minutes Intravenous Every 24 hours 07/07/20 1003 07/08/20 1253   07/07/20 1030  metroNIDAZOLE (FLAGYL) IVPB 500 mg  Status:  Discontinued        500 mg 100 mL/hr over 60 Minutes Intravenous Every 8 hours 07/07/20 0940 07/08/20 1253   07/06/20 1800  Ampicillin-Sulbactam (UNASYN) 3 g in sodium chloride 0.9 % 100 mL IVPB  Status:  Discontinued        3 g 200 mL/hr over 30 Minutes Intravenous Every 24 hours 07/06/20 1018 07/07/20 0959   07/03/20 2200  Ampicillin-Sulbactam (UNASYN) 3 g in sodium chloride 0.9 % 100 mL IVPB  Status:  Discontinued        3 g 200 mL/hr over 30 Minutes Intravenous Every 12 hours 07/03/20 1230 07/06/20 1018   07/03/20 0400  Ampicillin-Sulbactam (UNASYN) 3 g in sodium chloride 0.9 % 100 mL IVPB  Status:  Discontinued        3 g 200 mL/hr over 30 Minutes Intravenous Every 6 hours 07/03/20 0314 07/03/20 1230   07/07/2020 2200  ceFAZolin (ANCEF) IVPB 2g/100 mL premix        2 g 200 mL/hr over 30 Minutes Intravenous Every 8 hours 06/16/2020 1630 07/02/20 0716   06/17/2020 0702  ceFAZolin (ANCEF) 2-4 GM/100ML-% IVPB       Note to Pharmacy: Register, Karen   : cabinet override      07/12/2020 0702 06/21/2020 0916   06/27/2020 0600  ceFAZolin (ANCEF) IVPB 2g/100 mL premix        2 g 200 mL/hr over 30 Minutes Intravenous On call to O.R. 06/30/20 2354 06/25/2020 1340       V. Charlena Cross, M.D., [facility] [place] Office: [phone] Pager:  [phone]

## 2020-07-11 NOTE — Plan of Care (Signed)
Pt's sedation stopped this morning after his daughter arrived.  Sedation off approx 2 hours.  Pt tachypneic in low 30s, diaphoretic, mild tachycardia in low 100s, agonal breathing on vent, appeared very uncomfortable, was not purposeful and did not open eyes to voice.  Moderate amounts of bile observed streaming from corners of his mouth.  RT reported suctioning him and he same fluid appeared to be coming from airway.  Subglottic suctioning produced fairly copious fluid that appeared to be bile also.  Therefore writing RN stopped TF and free water flushes and attached OG to LIS, pt produced 1050 ccs brown/green fluid < 1 hour.  TF and free water flushes DCd at this time and D5 1/2 NS started per Dr Wolfgang Phoenix.  Sedation restarted with daughter in room; she agreed her father appeared too uncomfortable to continue.  Pt continues w/agonal breathing on the vent this afternoon, tx with dilaudid pushes for now per Dr Earlie Server.  Blistering of lower ABD, thighs continues, although does not appear any worse.

## 2020-07-11 NOTE — Progress Notes (Signed)
PHARMACY - TOTAL PARENTERAL NUTRITION CONSULT NOTE   Indication: Prolonged ileus  Patient Measurements: Height: 5\' 5"  (165.1 cm) Weight: 74 kg (163 lb 2.3 oz) IBW/kg (Calculated) : 61.5 TPN AdjBW (KG): 63.5 Body mass index is 27.15 kg/m.  Assessment: 65 year old male with PMHx of CAD, PAD, DM, HLD, MI s/p aortobifemoral bypass, bilateral renal endarterectomies, aortic endarterectomy, and bilateral femoral endarterectomies. Tube feeds were unable to be initiated yesterday. Patient was 6 days without nutrition prior to TPN initiation.   Glucose / Insulin: 165 > 193    23 units SSI in a 24 hour period. 23 units of levemir and started on insulin gtt for 18 hours. Stopped 2/26 @0500 .  Solucortef 50 mg IV q 8 hours Electrolytes: hypokalemia, hypernatremia  Renal: iHD Hepatic:  Intake / Output; MIVF: 3994 / 3075 (+ 919 mL) GI Imaging: 2/21: Suspect a degree of bowel ileus. No evident free air on semi-erect image GI Surgeries / Procedures: no recent  Central access: 2020-07-27 TPN start date: 07/07/20  Nutritional Goals (per RD recommendation on 07/07/20): kCal: 1800 - 2000/day, Protein: 95 - 110 grams/day, Fluid: 2L/day  Current Nutrition:  NPO  Plan:   Will decrease TPN to 60 mL/hr from 80 mL/hr due to the addition of D5 fluids. continue  TPN at 80 mL/hr at 1800 (1440 mL total)  Nutritional Components  Amino Acids (Clinisol 15%) 54 g/L: 84.25 grams total  Dextrose: 19%: 296.4 grams total  KCal: 1344.72 total/day  Added D5 @75  ml/hr due to Na level. On free water 300 mL q2H.   Electrolytes in TPN: Na 71mEq/L, K 44mEq/L, Ca 55mEq/L, Mg 27mEq/L, and Phos 15 mmol/L. Cl:Ac 1:1  Add standard MVI and trace elements to TPN  insulin infusion stopped and started levemir 23 units daily + SSI + novolog 5 units every 4 hours.   Continue free water 300 mL per tube q2h   Monitor TPN labs on Mon/Thurs, daily until stable   45m 07/11/2020,9:34 AM

## 2020-07-11 NOTE — Progress Notes (Signed)
[facility]  ROUNDING NOTE   Subjective:   2/19- patient extubated 2/20- 1st HD 2/21-iv lasix- good response 2/23 patient is now reintubated 2/25-sedated with fentanyl, FiO2 35%, TPN 80 cc/h, half-normal saline, no acute events 2/26-Patient seen in the ICU. Patient remains intubated patient is unable to offer any complaints. Patient data was reviewed with the team.  Nursing team informed me that patient has had increased tracheal aspiration and then on aspiration from patient NG tube patient has large output.  So I discontinued patient's oral fluids.  Patient was getting free water through the tube.  Objective:   Vitals:   07/11/20 1400 07/11/20 1500  BP: (!) 97/51 (!) 98/52  Pulse: 100 (!) 108  Resp: (!) 26 (!) 25  Temp: 98.96 F (37.2 C) 99.14 F (37.3 C)  SpO2: 99% 98%     Weight change: -2.2 kg Filed Weights   07/09/20 0410 07/10/20 0427 07/11/20 0500  Weight: 78 kg 76.2 kg 74 kg    Intake/Output: I/O last 3 completed shifts: In: 10947.3 [I.V.:6437.9; NG/GT:3180.7; IV Piggyback:1328.7] Out: 6237 [Urine:5225]   Intake/Output this shift:  Total I/O In: 1532.4 [I.V.:1211.6; NG/GT:265; IV Piggyback:55.8] Out: 6283 [Urine:310; Emesis/NG output:1050; Stool:175]  Physical Exam: General: Critically ill  HEENT: NGT in place, mouth is dry  Lungs:  Patrick O2, mild coarse breath sounds, vent assisted  Heart: Regular rate and rhythm  Abdomen:  +distended, dressings clean, decreased sounds  Extremities: 2+peripheral edema over thighs. Scrotal edema  Neurologic: Sedated on the vent  Skin: No lesions  Access: None at this time  Foley in place Rectal tube  Basic Metabolic Panel: Recent Labs  Lab 07/08/20 0613 07/09/20 0425 07/09/20 1250 07/09/20 2107 07/10/20 0312 07/11/20 0424 07/11/20 0622 07/11/20 0933  NA 151* 157*  --   --  156* 161* 163* 159*  160*  K 3.2* 2.8*   < > 3.3* 3.4* 3.2* 3.6 3.5  CL 106 112*  --   --  117* 123* 124* 121*  CO2 25 32   --   --  29 28 30 27   GLUCOSE 332* 333*  --   --  457* 174* 187* 336*  BUN 96* 95*  --   --  94* 84* 87* 93*  CREATININE 4.56* 3.69*  --   --  2.88* 2.12* 2.13* 2.45*  CALCIUM 6.6* 7.0*  --   --  7.2* 7.6* 7.6* 7.4*  MG 2.6* 2.4  --  2.1 2.1 2.0  --   --   PHOS 8.3* 2.6  --  1.4* 3.4 2.0*  --   --    < > = values in this interval not displayed.    Creatinine trend 2022  5.2==>2.4   Liver Function Tests: Recent Labs  Lab 07/05/20 0443 07/06/20 0434 07/08/20 0613 07/11/20 0424  AST 386* 262* 182* 60*  ALT 125* 70* 45* 20  ALKPHOS 122 131* 171* 112  BILITOT 1.4* 1.6* 1.9* 1.4*  PROT 4.5* 4.5* 4.6* 4.9*  ALBUMIN 2.0* 2.1* 1.7* 2.2*   No results for input(s): LIPASE, AMYLASE in the last 168 hours. No results for input(s): AMMONIA in the last 168 hours.  CBC: Recent Labs  Lab 07/08/20 0613 07/09/20 0425 07/10/20 0312 07/11/20 0424 07/11/20 0622 07/11/20 0933  WBC 20.5* 22.2* 33.7* 56.7* 57.5* 54.2*  NEUTROABS 15.6*  --   --   --  53.3*  --   HGB 7.9* 7.6* 8.1* 9.1* 9.1* 9.5*  HCT 24.3* 21.5* 24.9* 29.2* 29.1* 29.5*  MCV 85.3 83.0 86.8 89.3 89.8 89.7  PLT 127* 115* 174 190 187 182    Cardiac Enzymes: No results for input(s): CKTOTAL, CKMB, CKMBINDEX, TROPONINI in the last 168 hours.  BNP: Invalid input(s): POCBNP  CBG: Recent Labs  Lab 07/11/20 0107 07/11/20 0153 07/11/20 0354 07/11/20 0734 07/11/20 1136  GLUCAP 168* 170* 165* 47* 353*    Microbiology: Results for orders placed or performed during the hospital encounter of 06/24/2020  MRSA PCR Screening     Status: None   Collection Time: 07/05/2020  4:32 PM   Specimen: Nasopharyngeal  Result Value Ref Range Status   MRSA by PCR NEGATIVE NEGATIVE Final    Comment:        The GeneXpert MRSA Assay (FDA approved for NASAL specimens only), is one component of a comprehensive MRSA colonization surveillance program. It is not intended to diagnose MRSA infection nor to guide or monitor treatment  for MRSA infections. Performed at [facility], [address] [place], [place] [address]   CULTURE, BLOOD (ROUTINE X 2) w Reflex to ID Panel     Status: None   Collection Time: 07/07/2020  6:07 PM   Specimen: BLOOD  Result Value Ref Range Status   Specimen Description BLOOD BLOOD RIGHT HAND  Final   Special Requests   Final    BOTTLES DRAWN AEROBIC AND ANAEROBIC Blood Culture results may not be optimal due to an inadequate volume of blood received in culture bottles   Culture   Final    NO GROWTH 5 DAYS Performed at [facility], [address] [place], [place] [address]    Report Status 07/06/2020 FINAL  Final  CULTURE, BLOOD (ROUTINE X 2) w Reflex to ID Panel     Status: None   Collection Time: 07/03/2020  6:07 PM   Specimen: BLOOD  Result Value Ref Range Status   Specimen Description BLOOD LEFT THUMB  Final   Special Requests   Final    BOTTLES DRAWN AEROBIC AND ANAEROBIC Blood Culture adequate volume   Culture   Final    NO GROWTH 5 DAYS Performed at [facility], [address], [place], [place] [address]    Report Status 07/06/2020 FINAL  Final  Culture, Respiratory w Gram Stain     Status: None   Collection Time: 07/03/20  2:17 PM   Specimen: Tracheal Aspirate; Respiratory  Result Value Ref Range Status   Specimen Description   Final    TRACHEAL ASPIRATE Performed at [facility], [address], [place], [place] [address]    Special Requests   Final    NONE Performed at [facility], [address] [place], [place] [address]    Gram Stain   Final    FEW WBC PRESENT, PREDOMINANTLY PMN ABUNDANT GRAM NEGATIVE RODS Performed at [facility], [place] [address], [place],  [address]    Culture   Final    ABUNDANT ESCHERICHIA COLI Confirmed Extended Spectrum Beta-Lactamase Producer (ESBL).  In bloodstream infections from ESBL organisms, carbapenems are preferred over piperacillin/tazobactam. They are shown to  have a lower risk of mortality.    Report Status 07/08/2020 FINAL  Final   Organism ID, Bacteria ESCHERICHIA COLI  Final      Susceptibility   Escherichia coli - MIC*    AMPICILLIN >=32 RESISTANT Resistant     CEFAZOLIN >=64 RESISTANT Resistant     CEFEPIME 8 INTERMEDIATE Intermediate     CEFTAZIDIME RESISTANT Resistant  CEFTRIAXONE >=64 RESISTANT Resistant     CIPROFLOXACIN >=4 RESISTANT Resistant     GENTAMICIN <=1 SENSITIVE Sensitive     IMIPENEM <=0.25 SENSITIVE Sensitive     TRIMETH/SULFA <=20 SENSITIVE Sensitive     AMPICILLIN/SULBACTAM >=32 RESISTANT Resistant     PIP/TAZO <=4 SENSITIVE Sensitive     * ABUNDANT ESCHERICHIA COLI  Urine Culture     Status: None   Collection Time: 07/07/20 10:43 AM   Specimen: Urine, Random  Result Value Ref Range Status   Specimen Description   Final    URINE, RANDOM Performed at [facility], [address], [place], [place] [address]    Special Requests   Final    NONE Performed at [facility] [address], [place], [place] [address]    Culture   Final    NO GROWTH Performed at [facility], [place] [address], [place], [place] [address]    Report Status 07/08/2020 FINAL  Final  Gastrointestinal Panel by PCR , Stool     Status: None   Collection Time: 07/07/20 12:05 PM   Specimen: Stool  Result Value Ref Range Status   Campylobacter species NOT DETECTED NOT DETECTED Final   Plesimonas shigelloides NOT DETECTED NOT DETECTED Final   Salmonella species NOT DETECTED NOT DETECTED Final   Yersinia enterocolitica NOT DETECTED NOT DETECTED Final   Vibrio species NOT DETECTED NOT DETECTED Final   Vibrio cholerae NOT DETECTED NOT DETECTED Final   Enteroaggregative E coli (EAEC) NOT DETECTED NOT DETECTED Final   Enteropathogenic E coli (EPEC) NOT DETECTED NOT DETECTED Final   Enterotoxigenic E coli (ETEC) NOT DETECTED NOT DETECTED Final   Shiga like toxin producing E coli (STEC) NOT DETECTED NOT DETECTED Final    Shigella/Enteroinvasive E coli (EIEC) NOT DETECTED NOT DETECTED Final   Cryptosporidium NOT DETECTED NOT DETECTED Final   Cyclospora cayetanensis NOT DETECTED NOT DETECTED Final   Entamoeba histolytica NOT DETECTED NOT DETECTED Final   Giardia lamblia NOT DETECTED NOT DETECTED Final   Adenovirus F40/41 NOT DETECTED NOT DETECTED Final   Astrovirus NOT DETECTED NOT DETECTED Final   Norovirus GI/GII NOT DETECTED NOT DETECTED Final   Rotavirus A NOT DETECTED NOT DETECTED Final   Sapovirus (I, II, IV, and V) NOT DETECTED NOT DETECTED Final    Comment: Performed at [facility], [address] [place], [place] [address]  C Difficile Quick Screen w PCR reflex     Status: None   Collection Time: 07/07/20 12:05 PM  Result Value Ref Range Status   C Diff antigen NEGATIVE NEGATIVE Final   C Diff toxin NEGATIVE NEGATIVE Final   C Diff interpretation No C. difficile detected.  Final    Comment: Performed at [facility], [address] [place], [place] [address]  CULTURE, BLOOD (ROUTINE X 2) w Reflex to ID Panel     Status: None (Preliminary result)   Collection Time: 07/07/20  1:38 PM   Specimen: BLOOD  Result Value Ref Range Status   Specimen Description BLOOD BLOOD LEFT HAND  Final   Special Requests   Final    BOTTLES DRAWN AEROBIC AND ANAEROBIC Blood Culture adequate volume   Culture   Final    NO GROWTH 4 DAYS Performed at [facility], [address] [place], [place] [address]    Report Status PENDING  Incomplete  CULTURE, BLOOD (ROUTINE X 2) w Reflex to ID Panel     Status: None (Preliminary result)  Collection Time: 07/07/20  1:38 PM   Specimen: BLOOD  Result Value Ref Range Status   Specimen Description BLOOD BLOOD RIGHT HAND  Final   Special Requests   Final    BOTTLES DRAWN AEROBIC AND ANAEROBIC Blood Culture adequate volume   Culture   Final    NO GROWTH 4 DAYS Performed at [facility], [address], [place], [place] [address]     Report Status PENDING  Incomplete  SARS CORONAVIRUS 2 (TAT 6-24 HRS) Nasopharyngeal Nasopharyngeal Swab     Status: None   Collection Time: 07/07/20  2:10 PM   Specimen: Nasopharyngeal Swab  Result Value Ref Range Status   SARS Coronavirus 2 NEGATIVE NEGATIVE Final    Comment: (NOTE) SARS-CoV-2 target nucleic acids are NOT DETECTED.  The SARS-CoV-2 RNA is generally detectable in upper and lower respiratory specimens during the acute phase of infection. Negative results do not preclude SARS-CoV-2 infection, do not rule out co-infections with other pathogens, and should not be used as the sole basis for treatment or other patient management decisions. Negative results must be combined with clinical observations, patient history, and epidemiological information. The expected result is Negative.  Fact Sheet for Patients: SugarRoll.be  Fact Sheet for Healthcare Providers: https://www.woods-mathews.com/  This test is not yet approved or cleared by the [place] FDA and  has been authorized for detection and/or diagnosis of SARS-CoV-2 by FDA under an Emergency Use Authorization (EUA). This EUA will remain  in effect (meaning this test can be used) for the duration of the COVID-19 declaration under Se ction 564(b)(1) of the Act, 21 U.S.C. section 360bbb-3(b)(1), unless the authorization is terminated or revoked sooner.  Performed at [facility], [place] [address] [place], [place] [address]     Coagulation Studies: No results for input(s): LABPROT, INR in the last 72 hours.  Urinalysis: No results for input(s): COLORURINE, LABSPEC, PHURINE, GLUCOSEU, HGBUR, BILIRUBINUR, KETONESUR, PROTEINUR, UROBILINOGEN, NITRITE, LEUKOCYTESUR in the last 72 hours.  Invalid input(s): APPERANCEUR    Imaging: CT HEAD WO CONTRAST  Result Date: 07/10/2020 CLINICAL DATA:  Altered mental status EXAM: CT HEAD WITHOUT CONTRAST TECHNIQUE: Contiguous  axial images were obtained from the base of the skull through the vertex without intravenous contrast. COMPARISON:  August 09, 2018 head CT and brain MRI FINDINGS: Brain: There is mild diffuse atrophy. There is no intracranial mass, hemorrhage, extra-axial fluid collection, or midline shift. There is mild soft decreased attenuation in the centra semiovale bilaterally. Elsewhere brain parenchyma appears unremarkable. There is no demonstrable acute infarct. Vascular: There is no hyperdense vessel. There is calcification in each carotid siphon region. Skull: Bony calvarium appears intact. Sinuses/Orbits: There is mucosal thickening in several ethmoid air cells. There is opacification in the lateral right sphenoid sinus. Orbits appear symmetric bilaterally. Other: Mastoid air cells are clear. IMPRESSION: Mild atrophy with mild periventricular small vessel disease. No acute infarct evident. No mass or hemorrhage. There are foci of arterial vascular calcification. There are scattered foci of paranasal sinus disease. Electronically Signed   By: Lowella Grip III M.D.   On: 07/10/2020 11:43   DG Chest Port 1 View  Result Date: 07/11/2020 CLINICAL DATA:  Hypoxia EXAM: PORTABLE CHEST 1 VIEW COMPARISON:  07/08/2020 FINDINGS: Endotracheal tube is seen 6.1 cm above the carina. Nasogastric tube extends into the expected distal body of the stomach. Right internal jugular central venous catheter tip is unchanged within the superior vena cava. Lung volumes are small but appears stable since prior examination.  Minimal left basilar atelectasis. No pneumothorax or pleural effusion. Cardiac size within normal limits. IMPRESSION: Stable support lines and tubes. Pulmonary hypoinflation, unchanged. Electronically Signed   By: Fidela Salisbury MD   On: 07/11/2020 06:36     Medications:   . sodium chloride Stopped (07/11/20 1215)  . sodium chloride    . anticoagulant sodium citrate    . dextrose 5 % and 0.45% NaCl 100 mL/hr at  07/11/20 1400  . fentaNYL infusion INTRAVENOUS 200 mcg/hr (07/11/20 1400)  . insulin Stopped (07/11/20 0517)  . magnesium sulfate bolus IVPB    . piperacillin-tazobactam (ZOSYN)  IV 12.5 mL/hr at 07/11/20 1400  . TPN ADULT (ION) 80 mL/hr at 07/11/20 1400  . TPN ADULT (ION)     . aspirin EC  81 mg Oral Q0600  . chlorhexidine gluconate (MEDLINE KIT)  15 mL Mouth Rinse BID  . Chlorhexidine Gluconate Cloth  6 each Topical Once  . collagenase  1 application Topical Daily  . docusate  100 mg Per Tube Daily  . fondaparinux (ARIXTRA) injection  2.5 mg Subcutaneous Q24H  . hydrocortisone sod succinate (SOLU-CORTEF) inj  50 mg Intravenous Q8H  . insulin aspart  0-20 Units Subcutaneous Q4H  . insulin aspart  5 Units Subcutaneous Q4H  . insulin detemir  0.3 Units/kg Subcutaneous Q24H  . mouth rinse  15 mL Mouth Rinse 10 times per day  . pantoprazole (PROTONIX) IV  40 mg Intravenous Q12H   sodium chloride, acetaminophen **OR** acetaminophen, alum & mag hydroxide-simeth, anticoagulant sodium citrate, HYDROmorphone (DILAUDID) injection, magnesium sulfate bolus IVPB, morphine injection, oxyCODONE-acetaminophen, phenol, senna-docusate, sorbitol, white petrolatum  Assessment/ Plan:  Mr. BOND GRIESHOP is a 65 y.o. white male with peripheral vascular disease, coronary artery disease, nephrolithiasis, hyperlipidemia, diabetes mellitus type II, who was admitted to [facility] on 07/04/2020 for Atherosclerosis of artery of extremity with ulceration ([place]) [I70.299, K87.681] S/P aorto-bifemoral bypass surgery [L57.262] Underwent bilateral femoral artery bypass graft by Dr. Lucky Cowboy and Dr. Delana Meyer. on 06/23/2020     Impression   1)Renal    AKI secondary to ATN Patient had ATN secondary to hypotension Patient required renal replacement therapy.  Patient was dialyzed once Patient thereafter pulled out his catheter Patient urine output is now better No need for renal placement therapy today   2)  hypotension Blood pressure is stable    3)Anemia of chronic disease  CBC Latest Ref Rng & Units 07/11/2020 07/11/2020 07/11/2020  WBC 4.0 - 10.5 K/uL 54.2(HH) 57.5(HH) 56.7(HH)  Hemoglobin 13.0 - 17.0 g/dL 9.5(L) 9.1(L) 9.1(L)  Hematocrit 39.0 - 52.0 % 29.5(L) 29.1(L) 29.2(L)  Platelets 150 - 400 K/uL 182 187 190       HGb at goal (9--11)   4) hypophosphatemia  Lab Results  Component Value Date   CALCIUM 7.4 (L) 07/11/2020   PHOS 2.0 (L) 07/11/2020   Secondary to decreased p.o. intake Patient is on TPN We will follow   5) acute respiratory failure Patient is requiring ventilation   6) Electrolytes   BMP Latest Ref Rng & Units 07/11/2020 07/11/2020 07/11/2020  Glucose 70 - 99 mg/dL 336(H) - 187(H)  BUN 8 - 23 mg/dL 93(H) - 87(H)  Creatinine 0.61 - 1.24 mg/dL 2.45(H) - 2.13(H)  BUN/Creat Ratio 10 - 24 - - -  Sodium 135 - 145 mmol/L 159(H) 160(H) 163(HH)  Potassium 3.5 - 5.1 mmol/L 3.5 - 3.6  Chloride 98 - 111 mmol/L 121(H) - 124(H)  CO2 22 - 32 mmol/L 27 - 30  Calcium  8.9 - 10.3 mg/dL 7.4(L) - 7.6(L)     Sodium Hypernatremia  Patient has free water deficit of  0.5 x74 kg times163-140 divided by 140= 6 liters  23 meq needs to be corrected at around 8 mEq/day is equal to 3 days 605m divided by 72 is equal to 83 mL/h We will suggest 100 mL of free water intake per hour Patient is on 75 mL/h of D5W and 200 mL of free water every 2 hours Plan was that we will increase free water intake to 300 mL Patient will free water intake will be discontinued We will change patient IV fluids to D5 half-normal saline at this point patient will get quarter normal saline this should help with his high sodium   Potassium Normokalemic Patient was hyperkalemic earlier Now better   7)Acid base  Co2 at goal   8) generalized edema Now better    Plan  We will increase free water intake by changing patient IV fluids Will DC p.o. water flushes    LOS: 10 Ferne [place] s  Ayren Zumbro 2/26/20223:54 PM

## 2020-07-11 NOTE — Progress Notes (Signed)
NP notified of critical WBC 56.7 and critical sodium 161, acknowledged, new order to repeat labs.

## 2020-07-12 ENCOUNTER — Inpatient Hospital Stay: Payer: Medicare Other

## 2020-07-12 ENCOUNTER — Encounter: Payer: Self-pay | Admitting: Vascular Surgery

## 2020-07-12 DIAGNOSIS — J9601 Acute respiratory failure with hypoxia: Secondary | ICD-10-CM | POA: Diagnosis not present

## 2020-07-12 DIAGNOSIS — I468 Cardiac arrest due to other underlying condition: Secondary | ICD-10-CM | POA: Diagnosis not present

## 2020-07-12 LAB — CBC
HCT: 25 % — ABNORMAL LOW (ref 39.0–52.0)
HCT: 24.3 % — ABNORMAL LOW (ref 39.0–52.0)
Hemoglobin: 8 g/dL — ABNORMAL LOW (ref 13.0–17.0)
Hemoglobin: 7.5 g/dL — ABNORMAL LOW (ref 13.0–17.0)
MCH: 28.7 pg (ref 26.0–34.0)
MCH: 28.3 pg (ref 26.0–34.0)
MCHC: 32 g/dL (ref 30.0–36.0)
MCHC: 30.9 g/dL (ref 30.0–36.0)
MCV: 89.6 fL (ref 80.0–100.0)
MCV: 91.7 fL (ref 80.0–100.0)
Platelets: 190 10*3/uL (ref 150–400)
Platelets: 193 10*3/uL (ref 150–400)
RBC: 2.79 MIL/uL — ABNORMAL LOW (ref 4.22–5.81)
RBC: 2.65 MIL/uL — ABNORMAL LOW (ref 4.22–5.81)
RDW: 23.2 % — ABNORMAL HIGH (ref 11.5–15.5)
RDW: 23.4 % — ABNORMAL HIGH (ref 11.5–15.5)
WBC: 49.5 10*3/uL — ABNORMAL HIGH (ref 4.0–10.5)
WBC: 49.9 10*3/uL — ABNORMAL HIGH (ref 4.0–10.5)
nRBC: 0.6 % — ABNORMAL HIGH (ref 0.0–0.2)
nRBC: 0.4 % — ABNORMAL HIGH (ref 0.0–0.2)

## 2020-07-12 LAB — BASIC METABOLIC PANEL
Anion gap: 11 (ref 5–15)
Anion gap: 11 (ref 5–15)
Anion gap: 10 (ref 5–15)
Anion gap: 9 (ref 5–15)
Anion gap: 9 (ref 5–15)
BUN: 106 mg/dL — ABNORMAL HIGH (ref 8–23)
BUN: 113 mg/dL — ABNORMAL HIGH (ref 8–23)
BUN: 112 mg/dL — ABNORMAL HIGH (ref 8–23)
BUN: 116 mg/dL — ABNORMAL HIGH (ref 8–23)
BUN: 122 mg/dL — ABNORMAL HIGH (ref 8–23)
CO2: 26 mmol/L (ref 22–32)
CO2: 26 mmol/L (ref 22–32)
CO2: 26 mmol/L (ref 22–32)
CO2: 27 mmol/L (ref 22–32)
CO2: 27 mmol/L (ref 22–32)
Calcium: 7.2 mg/dL — ABNORMAL LOW (ref 8.9–10.3)
Calcium: 7 mg/dL — ABNORMAL LOW (ref 8.9–10.3)
Calcium: 7.1 mg/dL — ABNORMAL LOW (ref 8.9–10.3)
Calcium: 7.1 mg/dL — ABNORMAL LOW (ref 8.9–10.3)
Calcium: 6.9 mg/dL — ABNORMAL LOW (ref 8.9–10.3)
Chloride: 119 mmol/L — ABNORMAL HIGH (ref 98–111)
Chloride: 117 mmol/L — ABNORMAL HIGH (ref 98–111)
Chloride: 118 mmol/L — ABNORMAL HIGH (ref 98–111)
Chloride: 118 mmol/L — ABNORMAL HIGH (ref 98–111)
Chloride: 117 mmol/L — ABNORMAL HIGH (ref 98–111)
Creatinine, Ser: 2.55 mg/dL — ABNORMAL HIGH (ref 0.61–1.24)
Creatinine, Ser: 2.64 mg/dL — ABNORMAL HIGH (ref 0.61–1.24)
Creatinine, Ser: 2.9 mg/dL — ABNORMAL HIGH (ref 0.61–1.24)
Creatinine, Ser: 2.89 mg/dL — ABNORMAL HIGH (ref 0.61–1.24)
Creatinine, Ser: 2.99 mg/dL — ABNORMAL HIGH (ref 0.61–1.24)
GFR, Estimated: 27 mL/min — ABNORMAL LOW (ref >60)
GFR, Estimated: 26 mL/min — ABNORMAL LOW (ref >60)
GFR, Estimated: 23 mL/min — ABNORMAL LOW (ref >60)
GFR, Estimated: 23 mL/min — ABNORMAL LOW (ref >60)
GFR, Estimated: 22 mL/min — ABNORMAL LOW (ref >60)
Glucose, Bld: 334 mg/dL — ABNORMAL HIGH (ref 70–99)
Glucose, Bld: 241 mg/dL — ABNORMAL HIGH (ref 70–99)
Glucose, Bld: 197 mg/dL — ABNORMAL HIGH (ref 70–99)
Glucose, Bld: 183 mg/dL — ABNORMAL HIGH (ref 70–99)
Glucose, Bld: 172 mg/dL — ABNORMAL HIGH (ref 70–99)
Potassium: 3.6 mmol/L (ref 3.5–5.1)
Potassium: 3.5 mmol/L (ref 3.5–5.1)
Potassium: 3.9 mmol/L (ref 3.5–5.1)
Potassium: 3.9 mmol/L (ref 3.5–5.1)
Potassium: 3.9 mmol/L (ref 3.5–5.1)
Sodium: 156 mmol/L — ABNORMAL HIGH (ref 135–145)
Sodium: 154 mmol/L — ABNORMAL HIGH (ref 135–145)
Sodium: 154 mmol/L — ABNORMAL HIGH (ref 135–145)
Sodium: 154 mmol/L — ABNORMAL HIGH (ref 135–145)
Sodium: 153 mmol/L — ABNORMAL HIGH (ref 135–145)

## 2020-07-12 LAB — GLUCOSE, CAPILLARY
Glucose-Capillary: 153 mg/dL — ABNORMAL HIGH (ref 70–99)
Glucose-Capillary: 154 mg/dL — ABNORMAL HIGH (ref 70–99)
Glucose-Capillary: 163 mg/dL — ABNORMAL HIGH (ref 70–99)
Glucose-Capillary: 165 mg/dL — ABNORMAL HIGH (ref 70–99)
Glucose-Capillary: 169 mg/dL — ABNORMAL HIGH (ref 70–99)
Glucose-Capillary: 171 mg/dL — ABNORMAL HIGH (ref 70–99)
Glucose-Capillary: 174 mg/dL — ABNORMAL HIGH (ref 70–99)
Glucose-Capillary: 174 mg/dL — ABNORMAL HIGH (ref 70–99)
Glucose-Capillary: 177 mg/dL — ABNORMAL HIGH (ref 70–99)
Glucose-Capillary: 194 mg/dL — ABNORMAL HIGH (ref 70–99)
Glucose-Capillary: 194 mg/dL — ABNORMAL HIGH (ref 70–99)
Glucose-Capillary: 226 mg/dL — ABNORMAL HIGH (ref 70–99)
Glucose-Capillary: 249 mg/dL — ABNORMAL HIGH (ref 70–99)
Glucose-Capillary: 253 mg/dL — ABNORMAL HIGH (ref 70–99)
Glucose-Capillary: 279 mg/dL — ABNORMAL HIGH (ref 70–99)
Glucose-Capillary: 281 mg/dL — ABNORMAL HIGH (ref 70–99)

## 2020-07-12 LAB — TRIGLYCERIDES: Triglycerides: 159 mg/dL — ABNORMAL HIGH (ref <150)

## 2020-07-12 LAB — CULTURE, BLOOD (ROUTINE X 2)
Culture: NO GROWTH
Culture: NO GROWTH
Special Requests: ADEQUATE
Special Requests: ADEQUATE

## 2020-07-12 LAB — SODIUM: Sodium: 150 mmol/L — ABNORMAL HIGH (ref 135–145)

## 2020-07-12 LAB — PHOSPHORUS: Phosphorus: 3.2 mg/dL (ref 2.5–4.6)

## 2020-07-12 LAB — PREPARE RBC (CROSSMATCH)

## 2020-07-12 LAB — MAGNESIUM: Magnesium: 1.8 mg/dL (ref 1.7–2.4)

## 2020-07-12 IMAGING — DX DG CHEST 1V PORT
1 series · 1 of 1 positions shown · non-contrast
Comparison: Chest x-ray [DATE].

CLINICAL DATA: 64-year-old male status post intubation.

EXAM:
PORTABLE CHEST 1 VIEW

[chest ap]
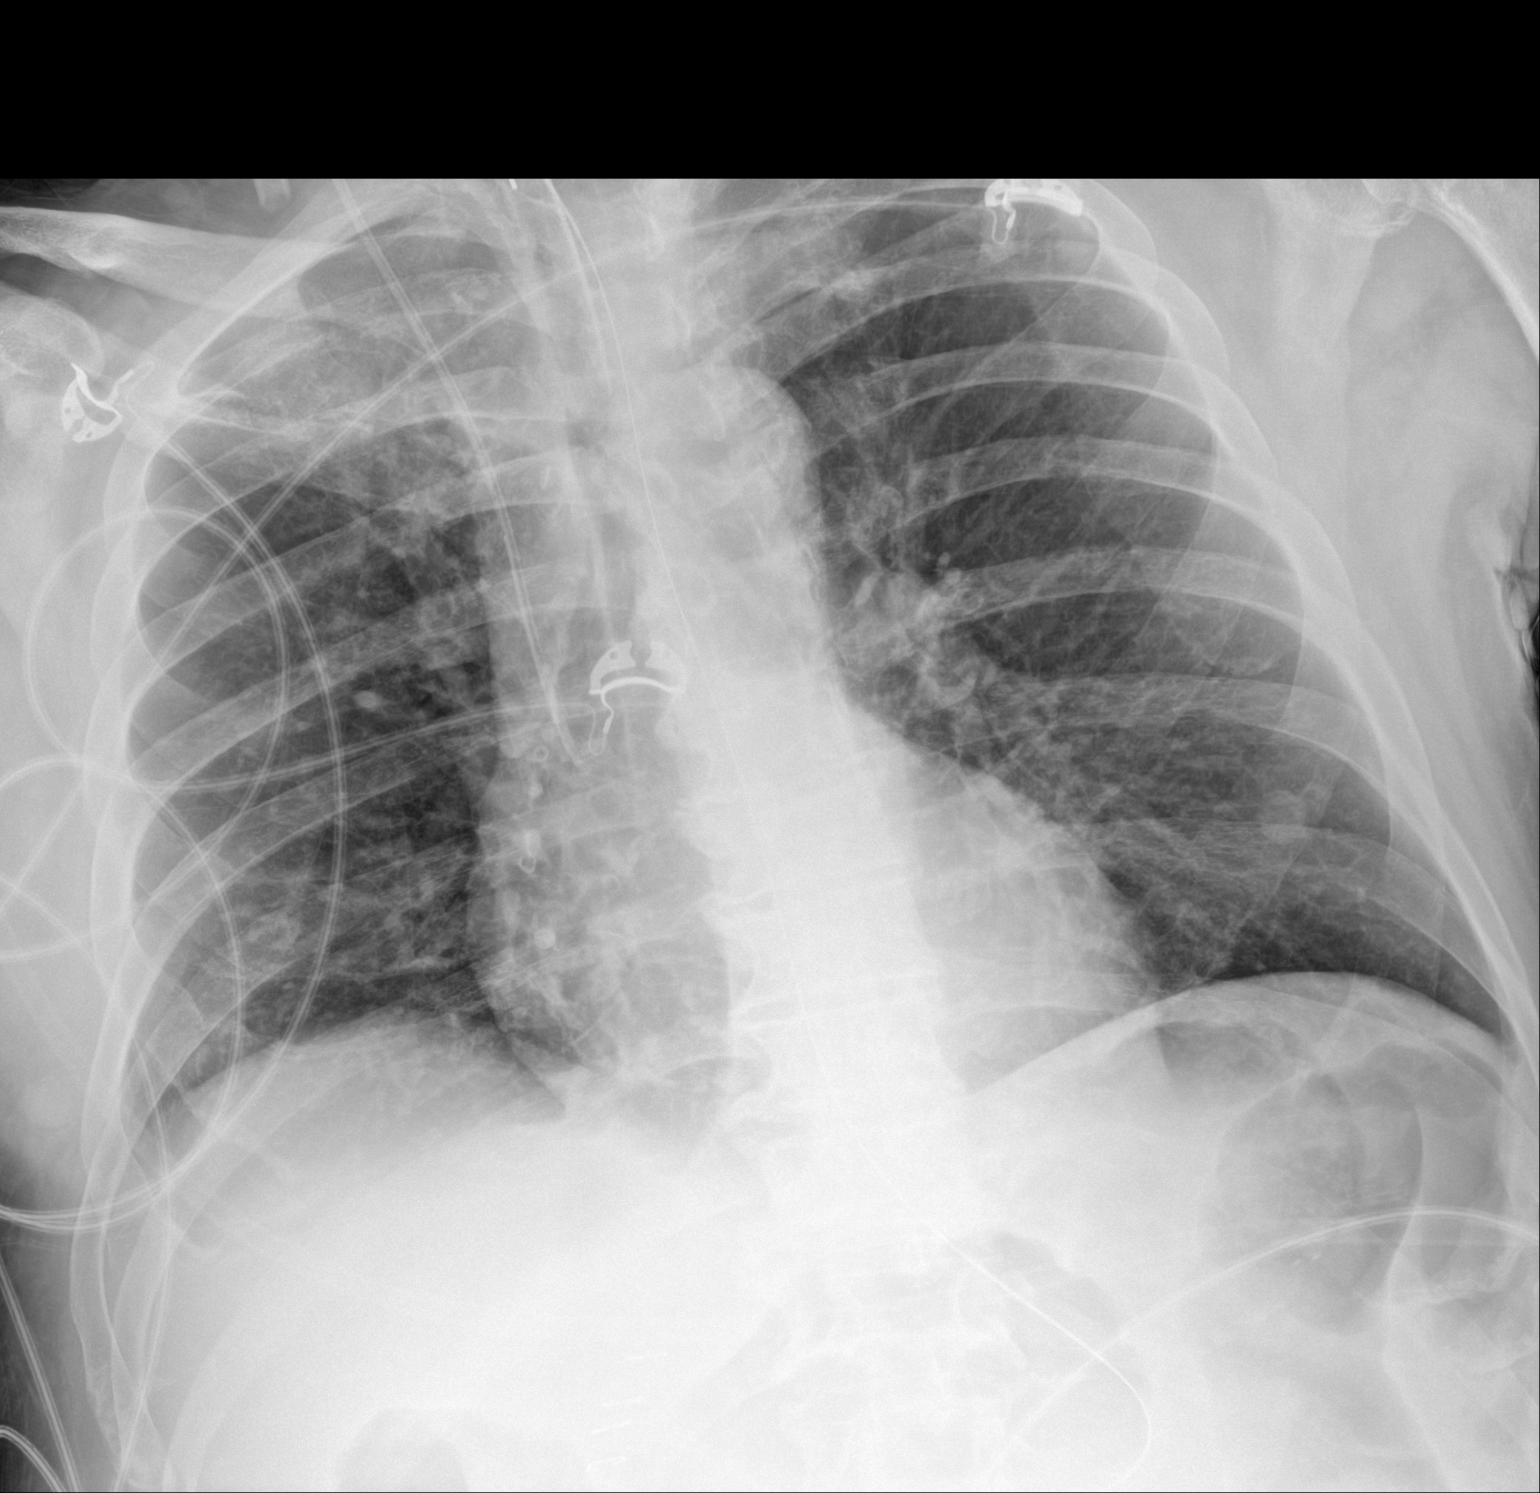

[1 of 1 positions shown; findings below may reference images not displayed]

FINDINGS: An endotracheal tube is in place with tip 4.5 cm above the carina.
There is a right-sided internal jugular central venous catheter with
tip terminating in the superior cavoatrial junction. A nasogastric
tube is seen extending into the stomach, however, the tip of the
nasogastric tube extends below the lower margin of the image. Lung
volumes are low. No consolidative airspace disease. No pleural
effusions. Prominent bilateral nipple shadows are incidentally
noted. No other definite suspicious appearing pulmonary nodules or
masses are noted. No pneumothorax. No evidence of pulmonary edema.
Heart size is normal. The patient is rotated to the right on today's
exam, resulting in distortion of the mediastinal contours and
reduced diagnostic sensitivity and specificity for mediastinal
pathology.
IMPRESSION: 1. Support apparatus, as above.
2. Low lung volumes without radiographic evidence of acute
cardiopulmonary disease.

## 2020-07-12 MED ORDER — ENOXAPARIN SODIUM 30 MG/0.3ML ~~LOC~~ SOLN: 30.0000 mg | SUBCUTANEOUS | Status: DC

## 2020-07-12 MED ORDER — SODIUM CHLORIDE 0.9% IV SOLUTION: Freq: Once | INTRAVENOUS | Status: AC

## 2020-07-12 MED ORDER — MIDAZOLAM HCL 2 MG/2ML IJ SOLN
4.0000 mg | Freq: Once | INTRAMUSCULAR | Status: AC
  Administered 2020-07-12: 4 mg via INTRAVENOUS
  Filled 2020-07-12: qty 4

## 2020-07-12 MED ORDER — TRACE MINERALS CU-MN-SE-ZN 300-55-60-3000 MCG/ML IV SOLN
INTRAVENOUS | Status: AC
  Filled 2020-07-12: qty 561.6

## 2020-07-12 MED ORDER — DEXTROSE 5 % IV SOLN: INTRAVENOUS | Status: DC

## 2020-07-12 MED ORDER — DEXTROSE-NACL 5-0.45 % IV SOLN: INTRAVENOUS | Status: DC

## 2020-07-12 MED ORDER — SODIUM CHLORIDE 0.9 % IV SOLN
1.0000 g | Freq: Two times a day (BID) | INTRAVENOUS | Status: DC
  Administered 2020-07-12 – 2020-07-14 (×5): 1 g via INTRAVENOUS
  Filled 2020-07-12 (×6): qty 1

## 2020-07-12 MED ORDER — LORAZEPAM 2 MG/ML IJ SOLN
2.0000 mg | INTRAMUSCULAR | Status: DC | PRN
  Administered 2020-07-12: 2 mg via INTRAVENOUS
  Filled 2020-07-12: qty 1

## 2020-07-12 NOTE — Progress Notes (Signed)
[facility]  ROUNDING NOTE   Subjective:   2/19- patient extubated 2/20- 1st HD 2/21-iv lasix- good response 2/23 patient is now reintubated 2/25-sedated with fentanyl, FiO2 35%, TPN 80 cc/h, half-normal saline, no acute events 2/26-Patient seen in the ICU. Patient remains intubated patient is unable to offer any complaints. Patient data was reviewed with the team.  Nursing team informed me that patient has had increased tracheal aspiration and then on aspiration from patient NG tube patient has large output.  So I discontinued patient's oral fluids.  Patient was getting free water through the tube. 2/27-patient seen in the ICU Patient remains intubated, unable to offer any complaints   Objective:   Vitals:   07/12/20 0500 07/12/20 0515  BP: (!) 113/54 (!) 111/52  Pulse: (!) 102 (!) 102  Resp: (!) 21 17  Temp: 99.68 F (37.6 C) 99.86 F (37.7 C)  SpO2: 100% 98%     Weight change:  Filed Weights   07/09/20 0410 07/10/20 0427 07/11/20 0500  Weight: 78 kg 76.2 kg 74 kg    Intake/Output: I/O last 3 completed shifts: In: 8059.4 [I.V.:5098.9; NG/GT:2245.7; IV Piggyback:714.8] Out: 3382 [Urine:4100; Emesis/NG output:1350; Stool:175]   Intake/Output this shift:  Total I/O In: 1825.9 [I.V.:1435.9; Other:45; IV Piggyback:344.9] Out: 825 [Urine:575; Emesis/NG output:250]  Physical Exam: General: Critically ill  HEENT: NGT in place, mouth is dry  Lungs:  [place] O2, mild coarse breath sounds, vent assisted  Heart: Regular rate and rhythm  Abdomen:  +distended, dressings clean, decreased sounds  Extremities: 2+peripheral edema over thighs. Scrotal edema  Neurologic: Sedated on the vent  Skin: No lesions  Access: None at this time  Foley in place Rectal tube  Basic Metabolic Panel: Recent Labs  Lab 07/09/20 0425 07/09/20 1250 07/09/20 2107 07/10/20 5053 07/11/20 0424 07/11/20 0622 07/11/20 0933 07/12/20 0004 07/12/20 0416  NA 157*  --   --  156* 161*  163* 159*  160* 156* 154*  K 2.8*   < > 3.3* 3.4* 3.2* 3.6 3.5 3.6 3.5  CL 112*  --   --  117* 123* 124* 121* 119* 117*  CO2 32  --   --  _0 GLUCOSE 333*  --   --  457* 174* 187* 336* 334* 241*  BUN 95*  --   --  94* 84* 87* 93* 106* 113*  CREATININE 3.69*  --   --  2.88* 2.12* 2.13* 2.45* 2.55* 2.64*  CALCIUM 7.0*  --   --  7.2* 7.6* 7.6* 7.4* 7.2* 7.0*  MG 2.4  --  2.1 2.1 2.0  --   --   --  1.8  PHOS 2.6  --  1.4* 3.4 2.0*  --   --   --  3.2   < > = values in this interval not displayed.    Creatinine trend 2022  5.2==>2.4   Liver Function Tests: Recent Labs  Lab 07/06/20 0434 07/08/20 0613 07/11/20 0424  AST 262* 182* 60*  ALT 70* 45* 20  ALKPHOS 131* 171* 112  BILITOT 1.6* 1.9* 1.4*  PROT 4.5* 4.6* 4.9*  ALBUMIN 2.1* 1.7* 2.2*   No results for input(s): LIPASE, AMYLASE in the last 168 hours. No results for input(s): AMMONIA in the last 168 hours.  CBC: Recent Labs  Lab 07/08/20 0613 07/09/20 0425 07/10/20 0312 07/11/20 0424 07/11/20 0622 07/11/20 0933 07/12/20 0416  WBC 20.5*   < > 33.7* 56.7* 57.5* 54.2* 49.5*  NEUTROABS 15.6*  --   --   --  53.3*  --   --   HGB 7.9*   < > 8.1* 9.1* 9.1* 9.5* 8.0*  HCT 24.3*   < > 24.9* 29.2* 29.1* 29.5* 25.0*  MCV 85.3   < > 86.8 89.3 89.8 89.7 89.6  PLT 127*   < > 174 190 187 182 190   < > = values in this interval not displayed.    Cardiac Enzymes: No results for input(s): CKTOTAL, CKMB, CKMBINDEX, TROPONINI in the last 168 hours.  BNP: Invalid input(s): POCBNP  CBG: Recent Labs  Lab 07/12/20 0111 07/12/20 0222 07/12/20 0310 07/12/20 0414 07/12/20 0521  GLUCAP 281* 253* 249* 23* 194*    Microbiology: Results for orders placed or performed during the hospital encounter of 06/16/2020  MRSA PCR Screening     Status: None   Collection Time: 07/10/2020  4:32 PM   Specimen: Nasopharyngeal  Result Value Ref Range Status   MRSA by PCR NEGATIVE NEGATIVE Final    Comment:        The GeneXpert  MRSA Assay (FDA approved for NASAL specimens only), is one component of a comprehensive MRSA colonization surveillance program. It is not intended to diagnose MRSA infection nor to guide or monitor treatment for MRSA infections. Performed at [facility], [address] [place], [place] [address]   CULTURE, BLOOD (ROUTINE X 2) w Reflex to ID Panel     Status: None   Collection Time: 06/19/2020  6:07 PM   Specimen: BLOOD  Result Value Ref Range Status   Specimen Description BLOOD BLOOD RIGHT HAND  Final   Special Requests   Final    BOTTLES DRAWN AEROBIC AND ANAEROBIC Blood Culture results may not be optimal due to an inadequate volume of blood received in culture bottles   Culture   Final    NO GROWTH 5 DAYS Performed at [facility], [address] [place], [place] [address]    Report Status 07/06/2020 FINAL  Final  CULTURE, BLOOD (ROUTINE X 2) w Reflex to ID Panel     Status: None   Collection Time: 07/02/2020  6:07 PM   Specimen: BLOOD  Result Value Ref Range Status   Specimen Description BLOOD LEFT THUMB  Final   Special Requests   Final    BOTTLES DRAWN AEROBIC AND ANAEROBIC Blood Culture adequate volume   Culture   Final    NO GROWTH 5 DAYS Performed at [facility], [address], [place], [place] [address]    Report Status 07/06/2020 FINAL  Final  Culture, Respiratory w Gram Stain     Status: None   Collection Time: 07/03/20  2:17 PM   Specimen: Tracheal Aspirate; Respiratory  Result Value Ref Range Status   Specimen Description   Final    TRACHEAL ASPIRATE Performed at [facility], [address] [place], [place] [address]    Special Requests   Final    NONE Performed at [facility], [address] [place], [place] [address]    Gram Stain   Final    FEW WBC PRESENT, PREDOMINANTLY PMN ABUNDANT GRAM NEGATIVE RODS Performed at [facility], [place] [address], [place], [place] [address]    Culture   Final     ABUNDANT ESCHERICHIA COLI Confirmed Extended Spectrum Beta-Lactamase Producer (ESBL).  In bloodstream infections from ESBL organisms, carbapenems are preferred over piperacillin/tazobactam. They are shown to have a lower risk of mortality.    Report Status 07/08/2020 FINAL  Final  Organism ID, Bacteria ESCHERICHIA COLI  Final      Susceptibility   Escherichia coli - MIC*    AMPICILLIN >=32 RESISTANT Resistant     CEFAZOLIN >=64 RESISTANT Resistant     CEFEPIME 8 INTERMEDIATE Intermediate     CEFTAZIDIME RESISTANT Resistant     CEFTRIAXONE >=64 RESISTANT Resistant     CIPROFLOXACIN >=4 RESISTANT Resistant     GENTAMICIN <=1 SENSITIVE Sensitive     IMIPENEM <=0.25 SENSITIVE Sensitive     TRIMETH/SULFA <=20 SENSITIVE Sensitive     AMPICILLIN/SULBACTAM >=32 RESISTANT Resistant     PIP/TAZO <=4 SENSITIVE Sensitive     * ABUNDANT ESCHERICHIA COLI  Urine Culture     Status: None   Collection Time: 07/07/20 10:43 AM   Specimen: Urine, Random  Result Value Ref Range Status   Specimen Description   Final    URINE, RANDOM Performed at [facility], [address], [place], [place] [address]    Special Requests   Final    NONE Performed at [facility], [address], [place], [place] [address]    Culture   Final    NO GROWTH Performed at [facility], [place] [address] [place], [place] [address]    Report Status 07/08/2020 FINAL  Final  Gastrointestinal Panel by PCR , Stool     Status: None   Collection Time: 07/07/20 12:05 PM   Specimen: Stool  Result Value Ref Range Status   Campylobacter species NOT DETECTED NOT DETECTED Final   Plesimonas shigelloides NOT DETECTED NOT DETECTED Final   Salmonella species NOT DETECTED NOT DETECTED Final   Yersinia enterocolitica NOT DETECTED NOT DETECTED Final   Vibrio species NOT DETECTED NOT DETECTED Final   Vibrio cholerae NOT DETECTED NOT DETECTED Final   Enteroaggregative E coli (EAEC) NOT DETECTED NOT DETECTED  Final   Enteropathogenic E coli (EPEC) NOT DETECTED NOT DETECTED Final   Enterotoxigenic E coli (ETEC) NOT DETECTED NOT DETECTED Final   Shiga like toxin producing E coli (STEC) NOT DETECTED NOT DETECTED Final   Shigella/Enteroinvasive E coli (EIEC) NOT DETECTED NOT DETECTED Final   Cryptosporidium NOT DETECTED NOT DETECTED Final   Cyclospora cayetanensis NOT DETECTED NOT DETECTED Final   Entamoeba histolytica NOT DETECTED NOT DETECTED Final   Giardia lamblia NOT DETECTED NOT DETECTED Final   Adenovirus F40/41 NOT DETECTED NOT DETECTED Final   Astrovirus NOT DETECTED NOT DETECTED Final   Norovirus GI/GII NOT DETECTED NOT DETECTED Final   Rotavirus A NOT DETECTED NOT DETECTED Final   Sapovirus (I, II, IV, and V) NOT DETECTED NOT DETECTED Final    Comment: Performed at [facility], [address] [place], [place] [address]  C Difficile Quick Screen w PCR reflex     Status: None   Collection Time: 07/07/20 12:05 PM  Result Value Ref Range Status   C Diff antigen NEGATIVE NEGATIVE Final   C Diff toxin NEGATIVE NEGATIVE Final   C Diff interpretation No C. difficile detected.  Final    Comment: Performed at [facility], [address] [place], [place] [address]  CULTURE, BLOOD (ROUTINE X 2) w Reflex to ID Panel     Status: None (Preliminary result)   Collection Time: 07/07/20  1:38 PM   Specimen: BLOOD  Result Value Ref Range Status   Specimen Description BLOOD BLOOD LEFT HAND  Final   Special Requests   Final    BOTTLES DRAWN AEROBIC AND ANAEROBIC Blood Culture adequate volume  Culture   Final    NO GROWTH 4 DAYS Performed at [facility] [address] [place], [place] [address]    Report Status PENDING  Incomplete  CULTURE, BLOOD (ROUTINE X 2) w Reflex to ID Panel     Status: None (Preliminary result)   Collection Time: 07/07/20  1:38 PM   Specimen: BLOOD  Result Value Ref Range Status   Specimen Description BLOOD BLOOD RIGHT HAND  Final    Special Requests   Final    BOTTLES DRAWN AEROBIC AND ANAEROBIC Blood Culture adequate volume   Culture   Final    NO GROWTH 4 DAYS Performed at [facility], [address], [place], [place] [address]    Report Status PENDING  Incomplete  SARS CORONAVIRUS 2 (TAT 6-24 HRS) Nasopharyngeal Nasopharyngeal Swab     Status: None   Collection Time: 07/07/20  2:10 PM   Specimen: Nasopharyngeal Swab  Result Value Ref Range Status   SARS Coronavirus 2 NEGATIVE NEGATIVE Final    Comment: (NOTE) SARS-CoV-2 target nucleic acids are NOT DETECTED.  The SARS-CoV-2 RNA is generally detectable in upper and lower respiratory specimens during the acute phase of infection. Negative results do not preclude SARS-CoV-2 infection, do not rule out co-infections with other pathogens, and should not be used as the sole basis for treatment or other patient management decisions. Negative results must be combined with clinical observations, patient history, and epidemiological information. The expected result is Negative.  Fact Sheet for Patients: SugarRoll.be  Fact Sheet for Healthcare Providers: https://www.woods-mathews.com/  This test is not yet approved or cleared by the [place] FDA and  has been authorized for detection and/or diagnosis of SARS-CoV-2 by FDA under an Emergency Use Authorization (EUA). This EUA will remain  in effect (meaning this test can be used) for the duration of the COVID-19 declaration under Se ction 564(b)(1) of the Act, 21 U.S.C. section 360bbb-3(b)(1), unless the authorization is terminated or revoked sooner.  Performed at [facility], [place] [address] [place], [place] [address]     Coagulation Studies: No results for input(s): LABPROT, INR in the last 72 hours.  Urinalysis: No results for input(s): COLORURINE, LABSPEC, PHURINE, GLUCOSEU, HGBUR, BILIRUBINUR, KETONESUR, PROTEINUR, UROBILINOGEN, NITRITE,  LEUKOCYTESUR in the last 72 hours.  Invalid input(s): APPERANCEUR    Imaging: CT HEAD WO CONTRAST  Result Date: 07/10/2020 CLINICAL DATA:  Altered mental status EXAM: CT HEAD WITHOUT CONTRAST TECHNIQUE: Contiguous axial images were obtained from the base of the skull through the vertex without intravenous contrast. COMPARISON:  August 09, 2018 head CT and brain MRI FINDINGS: Brain: There is mild diffuse atrophy. There is no intracranial mass, hemorrhage, extra-axial fluid collection, or midline shift. There is mild soft decreased attenuation in the centra semiovale bilaterally. Elsewhere brain parenchyma appears unremarkable. There is no demonstrable acute infarct. Vascular: There is no hyperdense vessel. There is calcification in each carotid siphon region. Skull: Bony calvarium appears intact. Sinuses/Orbits: There is mucosal thickening in several ethmoid air cells. There is opacification in the lateral right sphenoid sinus. Orbits appear symmetric bilaterally. Other: Mastoid air cells are clear. IMPRESSION: Mild atrophy with mild periventricular small vessel disease. No acute infarct evident. No mass or hemorrhage. There are foci of arterial vascular calcification. There are scattered foci of paranasal sinus disease. Electronically Signed   By: Lowella Grip III M.D.   On: 07/10/2020 11:43   DG Chest Port 1 View  Result Date: 07/11/2020 CLINICAL DATA:  Hypoxia EXAM:  PORTABLE CHEST 1 VIEW COMPARISON:  07/08/2020 FINDINGS: Endotracheal tube is seen 6.1 cm above the carina. Nasogastric tube extends into the expected distal body of the stomach. Right internal jugular central venous catheter tip is unchanged within the superior vena cava. Lung volumes are small but appears stable since prior examination. Minimal left basilar atelectasis. No pneumothorax or pleural effusion. Cardiac size within normal limits. IMPRESSION: Stable support lines and tubes. Pulmonary hypoinflation, unchanged. Electronically  Signed   By: Fidela Salisbury MD   On: 07/11/2020 06:36   [place] EKG SITE RITE  Result Date: 07/11/2020 If Site Rite image not attached, placement could not be confirmed due to current cardiac rhythm.    Medications:   . sodium chloride 10 mL/hr at 07/12/20 0400  . sodium chloride    . anidulafungin    . anticoagulant sodium citrate    . ceFEPime (MAXIPIME) IV Stopped (07/11/20 1825)  . dextrose 75 mL/hr at 07/12/20 0400  . HYDROmorphone 1.5 mg/hr (07/12/20 0400)  . insulin 13 Units/hr (07/12/20 0521)  . magnesium sulfate bolus IVPB    . metronidazole Stopped (07/12/20 0345)  . norepinephrine (LEVOPHED) Adult infusion 4 mcg/min (07/12/20 0400)  . propofol (DIPRIVAN) infusion 15 mcg/kg/min (07/12/20 0400)  . TPN ADULT (ION) 65 mL/hr at 07/12/20 0400   . aspirin EC  81 mg Oral Q0600  . chlorhexidine gluconate (MEDLINE KIT)  15 mL Mouth Rinse BID  . Chlorhexidine Gluconate Cloth  6 each Topical Once  . collagenase  1 application Topical Daily  . docusate  100 mg Per Tube Daily  . fondaparinux (ARIXTRA) injection  2.5 mg Subcutaneous Q24H  . hydrocortisone sod succinate (SOLU-CORTEF) inj  50 mg Intravenous Q12H  . mouth rinse  15 mL Mouth Rinse 10 times per day  . pantoprazole (PROTONIX) IV  40 mg Intravenous Q12H  . vancomycin  125 mg Oral QID   sodium chloride, acetaminophen **OR** acetaminophen, alum & mag hydroxide-simeth, anticoagulant sodium citrate, HYDROmorphone (DILAUDID) injection, magnesium sulfate bolus IVPB, phenol, polyvinyl alcohol, senna-docusate, sorbitol, white petrolatum  Assessment/ Plan:  Ivan Larsen is a 65 y.o. white male with peripheral vascular disease, coronary artery disease, nephrolithiasis, hyperlipidemia, diabetes mellitus type II, who was admitted to [facility] on 07/09/2020 for Atherosclerosis of artery of extremity with ulceration ([place]) [I70.299, F02.637] S/P aorto-bifemoral bypass surgery [C58.850] Underwent bilateral femoral artery bypass graft by  Dr. Lucky Cowboy and Dr. Delana Meyer. on 06/21/2020     Impression   1)Renal    AKI secondary to ATN Patient had ATN secondary to hypotension Patient required renal replacement therapy.   Patient was dialyzed once. Patient thereafter pulled out his catheter Patient urine output has decreased than before today since yesterday Patient creatinine is trending up now since yesterday Patient is now requiring vasopressors again  Patient AKI is worsening Patient does not need renal replacement therapy today but will probably need soon   2) hypotension Patient is now requiring vasopressor support  3)Anemia of chronic disease  CBC Latest Ref Rng & Units 07/12/2020 07/11/2020 07/11/2020  WBC 4.0 - 10.5 K/uL 49.5(H) 54.2(HH) 57.5(HH)  Hemoglobin 13.0 - 17.0 g/dL 8.0(L) 9.5(L) 9.1(L)  Hematocrit 39.0 - 52.0 % 25.0(L) 29.5(L) 29.1(L)  Platelets 150 - 400 K/uL 190 182 187       HGb is not at goal (9--11)   4) hypophosphatemia  Lab Results  Component Value Date   CALCIUM 7.0 (L) 07/12/2020   PHOS 3.2 07/12/2020   Secondary to decreased p.o. intake Patient is  on TPN Now better   5) acute respiratory failure Patient is requiring ventilation   6) Electrolytes   BMP Latest Ref Rng & Units 07/12/2020 07/12/2020 07/11/2020  Glucose 70 - 99 mg/dL 241(H) 334(H) 336(H)  BUN 8 - 23 mg/dL 113(H) 106(H) 93(H)  Creatinine 0.61 - 1.24 mg/dL 2.64(H) 2.55(H) 2.45(H)  BUN/Creat Ratio 10 - 24 - - -  Sodium 135 - 145 mmol/L 154(H) 156(H) 159(H)  Potassium 3.5 - 5.1 mmol/L 3.5 3.6 3.5  Chloride 98 - 111 mmol/L 117(H) 119(H) 121(H)  CO2 22 - 32 mmol/L _0 Calcium 8.9 - 10.3 mg/dL 7.0(L) 7.2(L) 7.4(L)     Sodium Hypernatremia  Patient had free water deficit of  0.5 x74 kg times163-140 divided by 140= 6 liters Patient sodium is now better  Potassium Normokalemic Patient was hyperkalemic earlier Now better   7)Acid base  Co2 at goal   8) generalized edema Patient urine output has  decreased Patient is on vasopressors We will hold off on giving IV diuretics for now We will follow patient Chem-7 Patient will most likely need renal placement therapy    Plan  We will suggest to change patient IV fluids from D5 to D5 normal saline We will follow urine output No need for renal placement therapy today    LOS: 11 Caide Campi s Ansley [place] 2/27/20225:32 AM

## 2020-07-12 NOTE — Progress Notes (Signed)
GOALS OF CARE DISCUSSION  The Clinical status was relayed to family in detail. Daughter at [place]  Updated and notified of patients medical condition.  Patient remains unresponsive and will not open eyes to command.    Patient is having a weak cough and struggling to remove secretions.   Patient with increased WOB and using accessory muscles to breathe Explained to family course of therapy and the modalities     Patient with Progressive multiorgan failure with a very high probablity of a very minimal chance of meaningful recovery despite all aggressive and optimal medical therapy.  Process associated with Suffering.  Family understands the situation. Patient remains FULL CODE  Family are satisfied with Plan of action and management. All questions answered  Additional CC time 32 mins   Antonetta Clanton Santiago Glad, M.D.  [facility]  Medical Director [facility] Medical Director [facility] Cardio-Pulmonary Department

## 2020-07-12 NOTE — Progress Notes (Signed)
Subjective  - POD #11  cx trach asp with E. Coli Heme positive stool    Physical Exam:  Intubated and sedated GCS 3 abd distended, no mottling Feet warm Incisions c/d/i       Assessment/Plan:  POD #11  CV:  ABF remains open.  Tachycardic, s/p CPR Heme:  receivig blood today for drop in Hb GI:  Now with heme positive stool.  Could be from mesenteric ischemia.  Will need CT of abd and pelvis, but not stable for transport currently Neuro:  Not responsive.  Plan for MRI  ID:  TA positive for E.Coli, on abx Pulm: unable to wean vent, possible trach  Ivan Larsen 07/12/2020 5:28 PM --  Vitals:   07/12/20 1645 07/12/20 1700  BP: (!) 114/52 (!) 104/50  Pulse: (!) 102 99  Resp: 17 19  Temp: 99.86 F (37.7 C) 99.86 F (37.7 C)  SpO2: 99% 98%    Intake/Output Summary (Last 24 hours) at 07/12/2020 1728 Last data filed at 07/12/2020 1341 Gross per 24 hour  Intake 4386.95 ml  Output 1150 ml  Net 3236.95 ml     Laboratory CBC    Component Value Date/Time   WBC 49.9 (H) 07/12/2020 1207   HGB 7.5 (L) 07/12/2020 1207   HGB 14.7 06/04/2020 1441   HCT 24.3 (L) 07/12/2020 1207   HCT 45.6 06/04/2020 1441   PLT 193 07/12/2020 1207   PLT 320 06/04/2020 1441    BMET    Component Value Date/Time   NA 153 (H) 07/12/2020 1550   NA 140 06/02/2020 1347   NA 141 07/03/2012 0511   K 3.9 07/12/2020 1550   K 4.0 07/03/2012 0511   CL 117 (H) 07/12/2020 1550   CL 108 (H) 07/03/2012 0511   CO2 27 07/12/2020 1550   CO2 24 07/03/2012 0511   GLUCOSE 172 (H) 07/12/2020 1550   GLUCOSE 185 (H) 07/03/2012 0511   BUN 122 (H) 07/12/2020 1550   BUN 24 06/02/2020 1347   BUN 14 07/03/2012 0511   CREATININE 2.99 (H) 07/12/2020 1550   CREATININE 0.86 07/03/2012 0511   CALCIUM 6.9 (L) 07/12/2020 1550   CALCIUM 8.1 (L) 07/03/2012 0511   GFRNONAA 22 (L) 07/12/2020 1550   GFRNONAA >60 07/03/2012 0511   GFRAA 76 06/02/2020 1347   GFRAA >60 07/03/2012 0511    COAG Lab  Results  Component Value Date   INR 1.5 (H) 07/08/2020   INR 1.0 July 12, 2020   INR 1.0 07/02/2012   No results found for: PTT  Antibiotics Anti-infectives (From admission, onward)   Start     Dose/Rate Route Frequency Ordered Stop   07/12/20 1815  anidulafungin (ERAXIS) 100 mg in sodium chloride 0.9 % 100 mL IVPB       "Followed by" Linked Group Details   100 mg 78 mL/hr over 100 Minutes Intravenous Every 24 hours 07/11/20 1717     07/12/20 1100  meropenem (MERREM) 1 g in sodium chloride 0.9 % 100 mL IVPB        1 g 200 mL/hr over 30 Minutes Intravenous Every 12 hours 07/12/20 1009 07/19/20 0959   07/11/20 1815  anidulafungin (ERAXIS) 200 mg in sodium chloride 0.9 % 200 mL IVPB       "Followed by" Linked Group Details   200 mg 78 mL/hr over 200 Minutes Intravenous  Once 07/11/20 1717 07/11/20 2154   07/11/20 1800  ceFEPIme (MAXIPIME) 2 g in sodium chloride 0.9 % 100 mL IVPB  Status:  Discontinued        2 g 200 mL/hr over 30 Minutes Intravenous Every 24 hours 07/11/20 1714 07/12/20 1009   07/11/20 1800  metroNIDAZOLE (FLAGYL) IVPB 500 mg  Status:  Discontinued        500 mg 100 mL/hr over 60 Minutes Intravenous Every 8 hours 07/11/20 1714 07/12/20 1009   07/11/20 1800  vancomycin (VANCOCIN) 50 mg/mL oral solution 125 mg        125 mg Oral 4 times daily 07/11/20 1714 07/21/20 1759   07/10/20 1200  piperacillin-tazobactam (ZOSYN) IVPB 3.375 g  Status:  Discontinued        3.375 g 12.5 mL/hr over 240 Minutes Intravenous Every 8 hours 07/10/20 0815 07/11/20 1714   07/08/20 1400  piperacillin-tazobactam (ZOSYN) IVPB 2.25 g  Status:  Discontinued        2.25 g 100 mL/hr over 30 Minutes Intravenous Every 8 hours 07/08/20 1257 07/10/20 0815   07/07/20 1400  ceFEPIme (MAXIPIME) 1 g in sodium chloride 0.9 % 100 mL IVPB  Status:  Discontinued        1 g 200 mL/hr over 30 Minutes Intravenous Every 8 hours 07/07/20 0940 07/07/20 1003   07/07/20 1200  vancomycin (VANCOCIN) 50 mg/mL oral  solution 125 mg  Status:  Discontinued        125 mg Oral Every 6 hours 07/07/20 0938 07/07/20 0940   07/07/20 1200  vancomycin (VANCOCIN) 500 mg in sodium chloride irrigation 0.9 % 100 mL ENEMA  Status:  Discontinued        500 mg Rectal Every 6 hours 07/07/20 0951 07/08/20 0858   07/07/20 1200  ceFEPIme (MAXIPIME) 2 g in sodium chloride 0.9 % 100 mL IVPB  Status:  Discontinued        2 g 200 mL/hr over 30 Minutes Intravenous Every 24 hours 07/07/20 1003 07/08/20 1253   07/07/20 1030  metroNIDAZOLE (FLAGYL) IVPB 500 mg  Status:  Discontinued        500 mg 100 mL/hr over 60 Minutes Intravenous Every 8 hours 07/07/20 0940 07/08/20 1253   07/06/20 1800  Ampicillin-Sulbactam (UNASYN) 3 g in sodium chloride 0.9 % 100 mL IVPB  Status:  Discontinued        3 g 200 mL/hr over 30 Minutes Intravenous Every 24 hours 07/06/20 1018 07/07/20 0959   07/03/20 2200  Ampicillin-Sulbactam (UNASYN) 3 g in sodium chloride 0.9 % 100 mL IVPB  Status:  Discontinued        3 g 200 mL/hr over 30 Minutes Intravenous Every 12 hours 07/03/20 1230 07/06/20 1018   07/03/20 0400  Ampicillin-Sulbactam (UNASYN) 3 g in sodium chloride 0.9 % 100 mL IVPB  Status:  Discontinued        3 g 200 mL/hr over 30 Minutes Intravenous Every 6 hours 07/03/20 0314 07/03/20 1230   07-03-2020 2200  ceFAZolin (ANCEF) IVPB 2g/100 mL premix        2 g 200 mL/hr over 30 Minutes Intravenous Every 8 hours Jul 03, 2020 1630 07/02/20 0716   Jul 03, 2020 0702  ceFAZolin (ANCEF) 2-4 GM/100ML-% IVPB       Note to Pharmacy: Register, Karen   : cabinet override      07/03/20 0702 07/03/2020 0916   07/03/20 0600  ceFAZolin (ANCEF) IVPB 2g/100 mL premix        2 g 200 mL/hr over 30 Minutes Intravenous On call to O.R. 06/30/20 2354 07-03-2020 1340       V. Ivan Ivan Larsen IV,  M.D., [facility] Office: [phone] Pager:  [phone]

## 2020-07-12 NOTE — Progress Notes (Addendum)
PHARMACY - TOTAL PARENTERAL NUTRITION CONSULT NOTE   Indication: Prolonged ileus  Patient Measurements: Height: 5\' 5"  (165.1 cm) Weight: 75.5 kg (166 lb 7.2 oz) IBW/kg (Calculated) : 61.5 TPN AdjBW (KG): 63.5 Body mass index is 27.7 kg/m.  Assessment: 65 year old male with PMHx of CAD, PAD, DM, HLD, MI s/p aortobifemoral bypass, bilateral renal endarterectomies, aortic endarterectomy, and bilateral femoral endarterectomies. Tube feeds were unable to be initiated yesterday. Patient was 6 days without nutrition prior to TPN initiation.   Glucose / Insulin: 163-241  13 units SSI in a 24 hour period. 23 units of levemir and started on insulin gtt. Sq insulin d/c'ed.  Solucortef 50 mg IV q 8 hours > q12H.  Electrolytes: hypokalemia, hypernatremia  Renal: iHD Hepatic:  Intake / Output; MIVF: 5080 / 2255 (+ 18 L) GI Imaging: 2/21: Suspect a degree of bowel ileus. No evident free air on semi-erect image GI Surgeries / Procedures: no recent  Central access: 07-18-2020 TPN start date: 07/07/20  Nutritional Goals (per RD recommendation on 07/07/20): kCal: 1800 - 2000/day, Protein: 95 - 110 grams/day, Fluid: 2L/day  Current Nutrition:  NPO  Plan:   Will decrease TPN to 65 mL/hr from 80 mL/hr due to the addition of D5 fluids. (1440 mL total). Sodium decreasing will decrease D5 to 50 mL/hr from 75 ml/hr.   Holding lipids, on propofol.   Nutritional Components  Amino Acids (Clinisol 15%) 54 g/L: 84.25 grams total  Dextrose: 19%: 296.4 grams total  KCal: 1344.72 total/day  Electrolytes in TPN: Na 30mEq/L, K 32mEq/L, Ca 37mEq/L, Mg 86mEq/L, and Phos 10 mmol/L. Cl:Ac 1:1  Add standard MVI and trace elements to TPN  insulin infusion restarted 2/26 PM.   Monitor TPN labs on Mon/Thurs, daily until stable   3/26, PharmD, BCPS 07/12/2020,9:24 AM

## 2020-07-12 NOTE — Progress Notes (Signed)
Multiple pages to Nephrology Dr Wolfgang Phoenix via Loretha Stapler without return call.  Need clearance for PICC placement.  Morrie Sheldon RN aware and will follow up re PICC need.  Morrie Sheldon RN to follow up concern re CVC as possible source for infection and line holiday need.

## 2020-07-12 NOTE — Progress Notes (Signed)
Pt remains in the ICU on mechanical ventilatory support. Patient received on continuous infusions of D5W, TPN, Reg. Insulin, Dilaudid, Propofol, and Levophed. On antibiotic therapies. Pt remained unresponsive throughout shift. Able to wean propofol and levophed off. PRN ativan ordered for anxiety/increased WOB, given 1x without significant improvement in WOB. OGT to low intermittent suction with a total output of 800 mL. Discussed concerns for possible GIB with attending provider due to OGT output (green/brown, suspicious for occult output), and had a downtrending H/H overnight. Possible for GI obstruction and/or ischemia. Repeat CBC drawn at noon, 1uPRBC ordered and given. BMP remained stable throughout shift. Pt's daughter present at bedside this morning and again in the afternoon. Attending provided patient update and discussed concerns of multiorgan failure with daughter, Archie Patten. She verbalized understanding. Potential plans for brain MRI, pending patient stability. Further discussions regarding patient prognosis and plan of care to be determined by MRI results. Pt's daughter requested daily updates if she is not able to be at bedside.
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
## 2020-07-13 ENCOUNTER — Inpatient Hospital Stay: Payer: Medicare Other

## 2020-07-13 DIAGNOSIS — N171 Acute kidney failure with acute cortical necrosis: Secondary | ICD-10-CM | POA: Diagnosis not present

## 2020-07-13 DIAGNOSIS — J690 Pneumonitis due to inhalation of food and vomit: Secondary | ICD-10-CM | POA: Diagnosis not present

## 2020-07-13 DIAGNOSIS — D72823 Leukemoid reaction: Secondary | ICD-10-CM | POA: Diagnosis not present

## 2020-07-13 DIAGNOSIS — R579 Shock, unspecified: Secondary | ICD-10-CM

## 2020-07-13 DIAGNOSIS — D649 Anemia, unspecified: Secondary | ICD-10-CM

## 2020-07-13 DIAGNOSIS — I468 Cardiac arrest due to other underlying condition: Secondary | ICD-10-CM | POA: Diagnosis not present

## 2020-07-13 DIAGNOSIS — J9601 Acute respiratory failure with hypoxia: Secondary | ICD-10-CM | POA: Diagnosis not present

## 2020-07-13 DIAGNOSIS — Z515 Encounter for palliative care: Secondary | ICD-10-CM | POA: Diagnosis not present

## 2020-07-13 DIAGNOSIS — Z7189 Other specified counseling: Secondary | ICD-10-CM | POA: Diagnosis not present

## 2020-07-13 LAB — CBC
HCT: 25 % — ABNORMAL LOW (ref 39.0–52.0)
Hemoglobin: 7.9 g/dL — ABNORMAL LOW (ref 13.0–17.0)
MCH: 28.2 pg (ref 26.0–34.0)
MCHC: 31.6 g/dL (ref 30.0–36.0)
MCV: 89.3 fL (ref 80.0–100.0)
Platelets: 181 10*3/uL (ref 150–400)
RBC: 2.8 MIL/uL — ABNORMAL LOW (ref 4.22–5.81)
RDW: 22.2 % — ABNORMAL HIGH (ref 11.5–15.5)
WBC: 41.2 10*3/uL — ABNORMAL HIGH (ref 4.0–10.5)
nRBC: 0.1 % (ref 0.0–0.2)

## 2020-07-13 LAB — GLUCOSE, CAPILLARY
Glucose-Capillary: 149 mg/dL — ABNORMAL HIGH (ref 70–99)
Glucose-Capillary: 156 mg/dL — ABNORMAL HIGH (ref 70–99)
Glucose-Capillary: 168 mg/dL — ABNORMAL HIGH (ref 70–99)
Glucose-Capillary: 172 mg/dL — ABNORMAL HIGH (ref 70–99)
Glucose-Capillary: 174 mg/dL — ABNORMAL HIGH (ref 70–99)
Glucose-Capillary: 175 mg/dL — ABNORMAL HIGH (ref 70–99)
Glucose-Capillary: 177 mg/dL — ABNORMAL HIGH (ref 70–99)
Glucose-Capillary: 180 mg/dL — ABNORMAL HIGH (ref 70–99)
Glucose-Capillary: 185 mg/dL — ABNORMAL HIGH (ref 70–99)
Glucose-Capillary: 189 mg/dL — ABNORMAL HIGH (ref 70–99)
Glucose-Capillary: 190 mg/dL — ABNORMAL HIGH (ref 70–99)
Glucose-Capillary: 193 mg/dL — ABNORMAL HIGH (ref 70–99)
Glucose-Capillary: 196 mg/dL — ABNORMAL HIGH (ref 70–99)
Glucose-Capillary: 197 mg/dL — ABNORMAL HIGH (ref 70–99)
Glucose-Capillary: 202 mg/dL — ABNORMAL HIGH (ref 70–99)
Glucose-Capillary: 204 mg/dL — ABNORMAL HIGH (ref 70–99)
Glucose-Capillary: 208 mg/dL — ABNORMAL HIGH (ref 70–99)
Glucose-Capillary: 210 mg/dL — ABNORMAL HIGH (ref 70–99)
Glucose-Capillary: 212 mg/dL — ABNORMAL HIGH (ref 70–99)
Glucose-Capillary: 222 mg/dL — ABNORMAL HIGH (ref 70–99)

## 2020-07-13 LAB — BPAM RBC
Blood Product Expiration Date: 202203232359
ISSUE DATE / TIME: 202202271702
Unit Type and Rh: 6200

## 2020-07-13 LAB — COMPREHENSIVE METABOLIC PANEL
ALT: 16 U/L (ref 0–44)
AST: 69 U/L — ABNORMAL HIGH (ref 15–41)
Albumin: 1.6 g/dL — ABNORMAL LOW (ref 3.5–5.0)
Alkaline Phosphatase: 85 U/L (ref 38–126)
Anion gap: 11 (ref 5–15)
BUN: 136 mg/dL — ABNORMAL HIGH (ref 8–23)
CO2: 24 mmol/L (ref 22–32)
Calcium: 6.8 mg/dL — ABNORMAL LOW (ref 8.9–10.3)
Chloride: 113 mmol/L — ABNORMAL HIGH (ref 98–111)
Creatinine, Ser: 3.4 mg/dL — ABNORMAL HIGH (ref 0.61–1.24)
GFR, Estimated: 19 mL/min — ABNORMAL LOW (ref >60)
Glucose, Bld: 183 mg/dL — ABNORMAL HIGH (ref 70–99)
Potassium: 4.6 mmol/L (ref 3.5–5.1)
Sodium: 148 mmol/L — ABNORMAL HIGH (ref 135–145)
Total Bilirubin: 1.3 mg/dL — ABNORMAL HIGH (ref 0.3–1.2)
Total Protein: 4.5 g/dL — ABNORMAL LOW (ref 6.5–8.1)

## 2020-07-13 LAB — TYPE AND SCREEN
ABO/RH(D): A POS
Antibody Screen: NEGATIVE
Unit division: 0

## 2020-07-13 LAB — PHOSPHORUS: Phosphorus: 5.1 mg/dL — ABNORMAL HIGH (ref 2.5–4.6)

## 2020-07-13 LAB — MAGNESIUM: Magnesium: 2 mg/dL (ref 1.7–2.4)

## 2020-07-13 IMAGING — MR MR HEAD W/O CM
9 of 11 series · 33 of 48 positions shown · non-contrast
Comparison: [DATE] and prior.

CLINICAL DATA: Mental status change, unknown cause.

EXAM:
MRI HEAD WITHOUT CONTRAST
TECHNIQUE: Multiplanar, multiecho pulse sequences of the brain and surrounding
structures were obtained without intravenous contrast.

[Series 5: ax dwi_tracew · axial · 3.0mm · 0.60mm/px · z∈[-56,+79]mm · 6 of 48 slices shown]
[im 1/48]
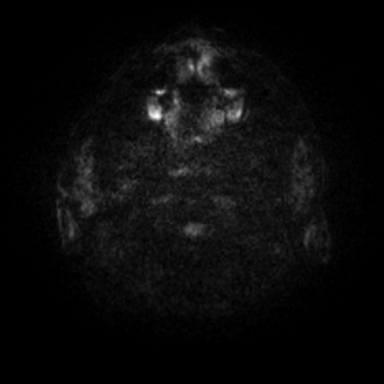
[im 10/48]
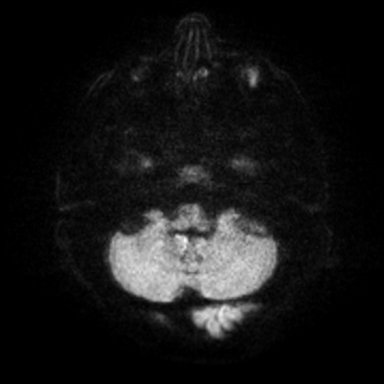
[im 19/48]
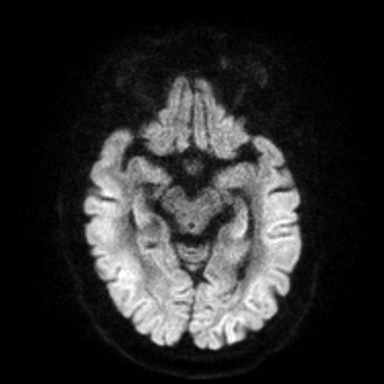
[im 29/48]
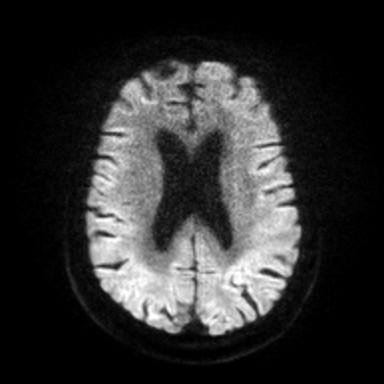
[im 38/48]
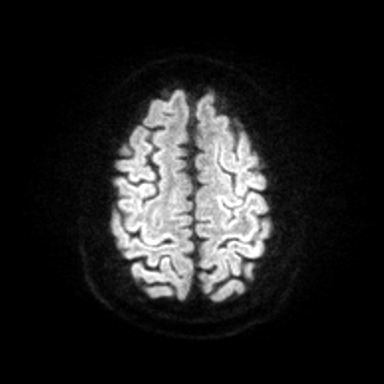
[im 48/48]
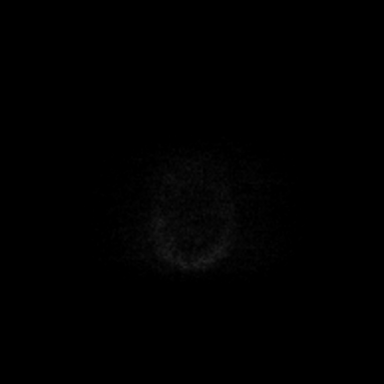

[Series 6: ax dwi_adc · axial · 3.0mm · 0.60mm/px · z∈[-56,+79]mm · 5 of 48 slices shown]
[im 1/48]
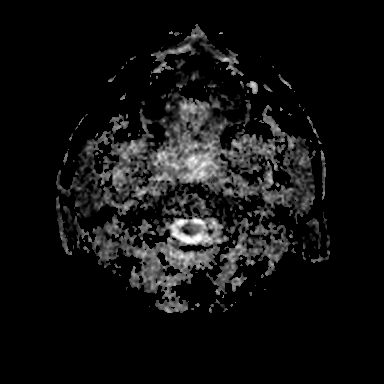
[im 12/48]
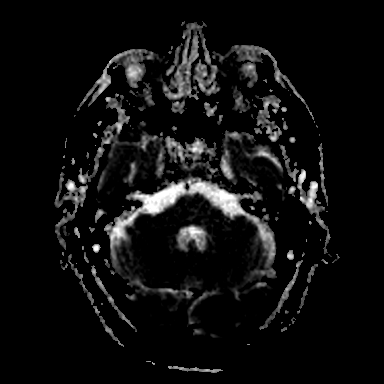
[im 24/48]
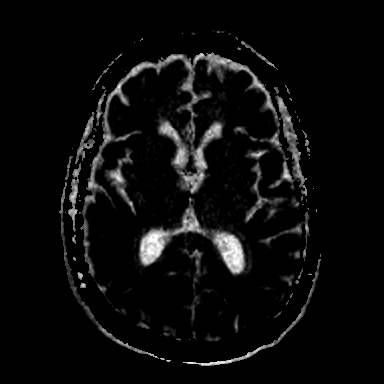
[im 36/48]
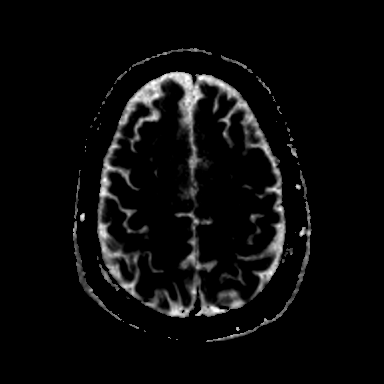
[im 48/48]
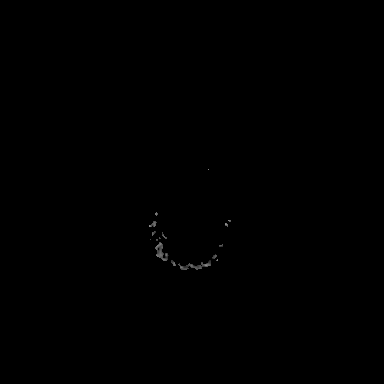

[Series 7: FLAIR · axial · 3.0mm · 0.53mm/px · z∈[-61,+80]mm · 6 of 55 slices shown]
[im 1/55]
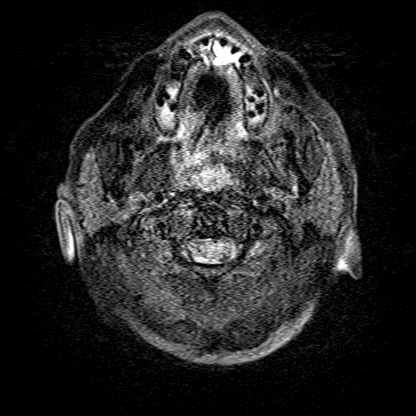
[im 11/55]
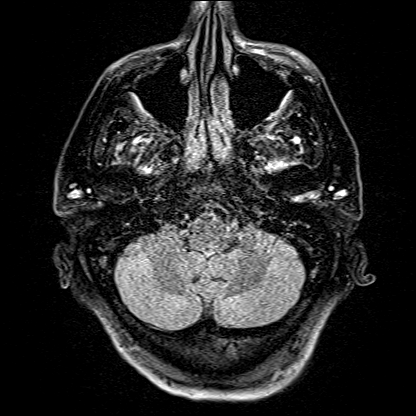
[im 22/55]
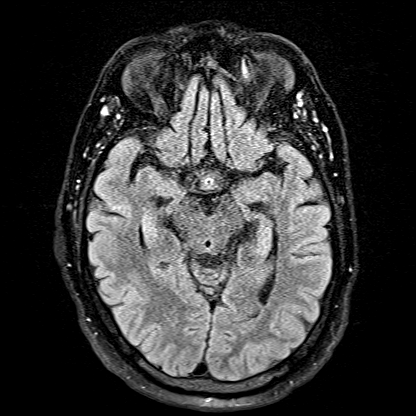
[im 33/55]
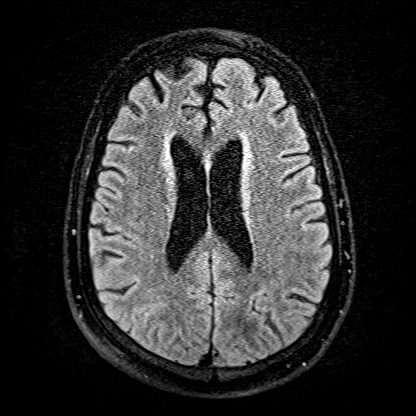
[im 44/55]
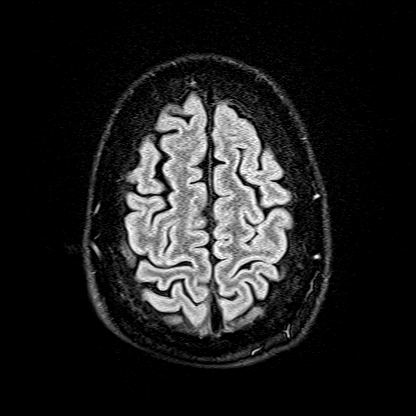
[im 55/55]
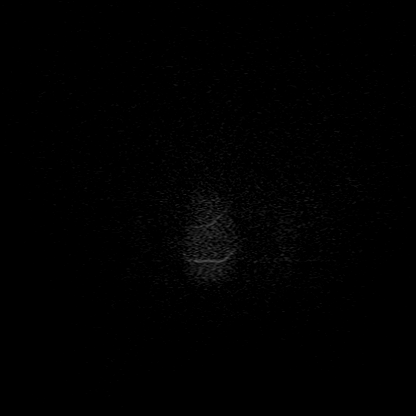

[Series 8: cor dwi_tracew · coronal · 5.0mm · 0.60mm/px · 4 of 40 slices shown]
[im 1/40]
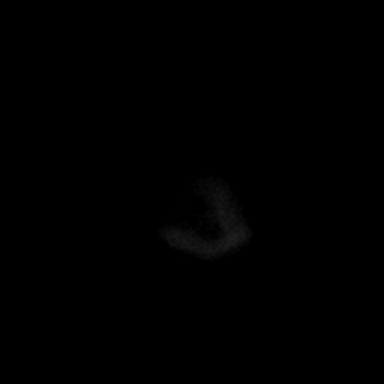
[im 14/40]
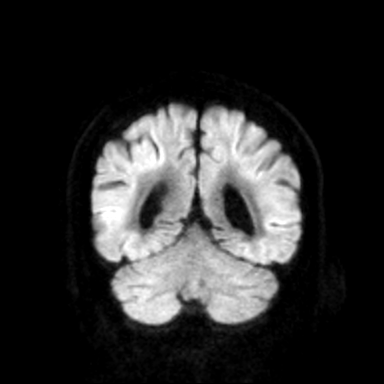
[im 27/40]
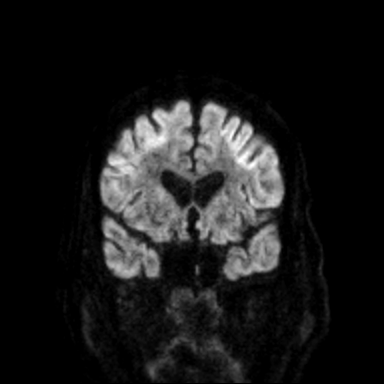
[im 40/40]
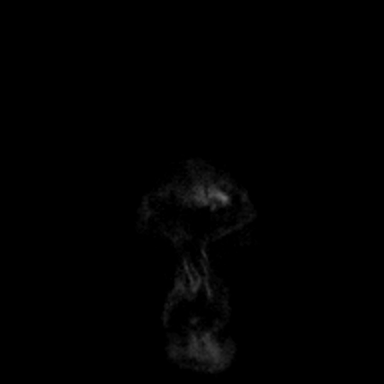

[Series 9: cor dwi_adc · coronal · 5.0mm · 0.60mm/px · 1 of 40 slices shown]
[im 1/40]
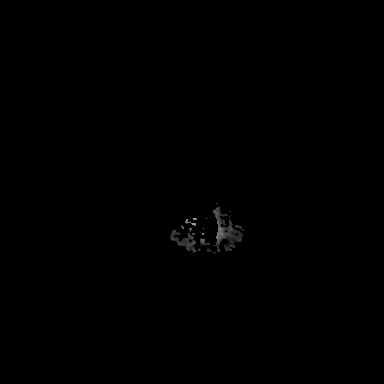

[Series 10: T1 · sagittal · 5.0mm · 0.94mm/px · 2 of 23 slices shown (1 of 2)]
[im 1/23]
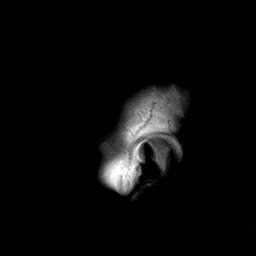
[im 23/23]
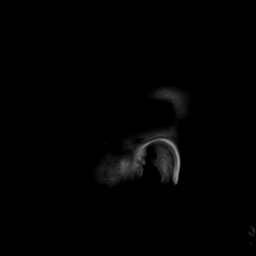

[Series 11: T2 · axial · 5.0mm · 0.45mm/px · z∈[-60,+76]mm · 3 of 27 slices shown (1 of 2)]
[im 1/27]
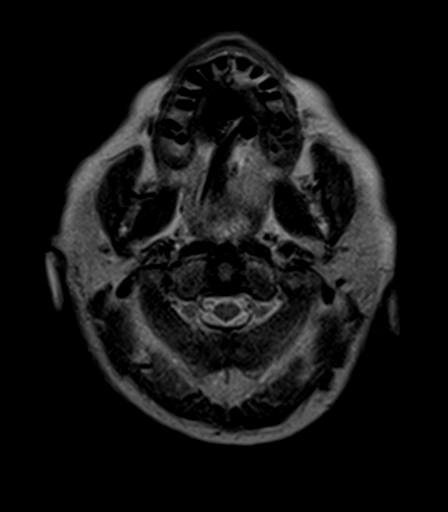
[im 14/27]
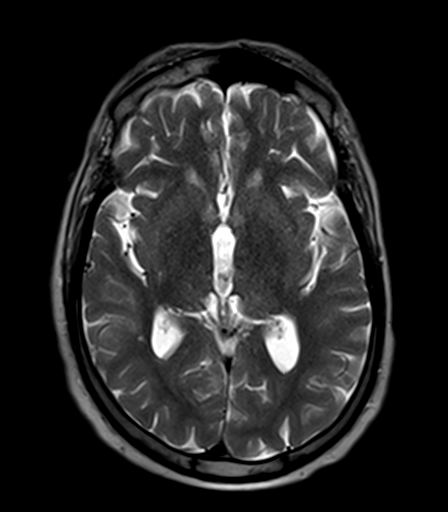
[im 27/27]
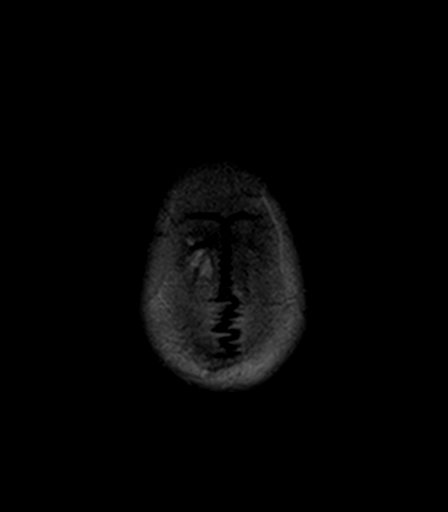

[Series 16: T1 · axial · 5.0mm · 0.90mm/px · z∈[-60,+76]mm · 3 of 27 slices shown (2 of 2)]
[im 1/27]
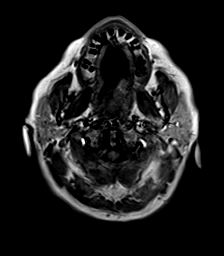
[im 14/27]
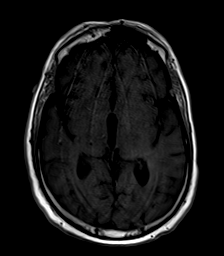
[im 27/27]
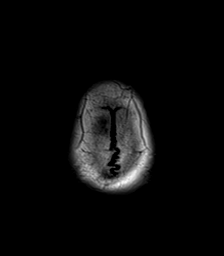

[Series 17: T2 · coronal · 5.0mm · 0.45mm/px · 3 of 31 slices shown (2 of 2)]
[im 1/31]
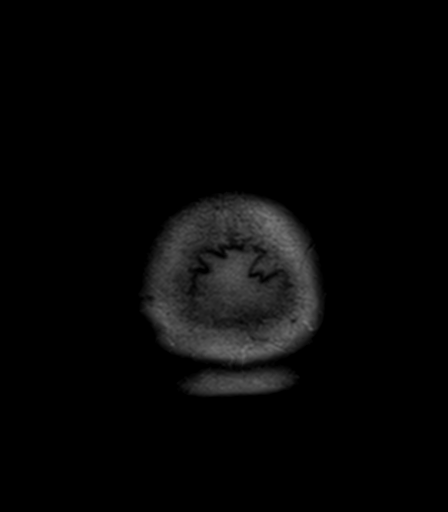
[im 16/31]
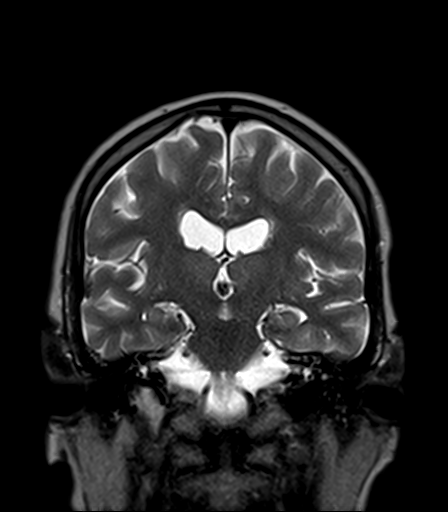
[im 31/31]
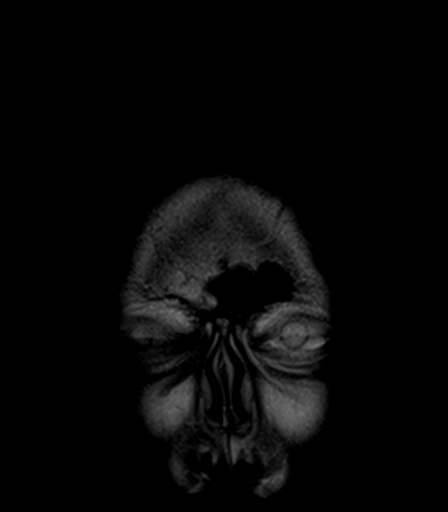

[33 of 48 positions shown; findings below may reference images not displayed]

FINDINGS: Please note image quality is degraded by motion artifact.

Brain: Diffuse cortical prominence with associated mild restricted
diffusion most prominent in the bilateral parietooccipital and
temporal regions. No focal restricted diffusion involving the
cerebellum. Remote left vertex microhemorrhage.

No midline shift or ventriculomegaly. No extra-axial fluid
collection. No mass lesion.

Vascular: Normal flow voids.

Skull and upper cervical spine: Normal marrow signal.

Sinuses/Orbits: Normal orbits. Mild ethmoid sinus mucosal
thickening. Bilateral mastoid free fluid.

Other: None.
IMPRESSION: Diffuse cerebral prominence with mild associated restricted
diffusion concerning for sequela of hypoxic-ischemic injury.

## 2020-07-13 MED ORDER — DEXTROSE 5 % IV SOLN: INTRAVENOUS | Status: DC

## 2020-07-13 MED ORDER — SODIUM CHLORIDE 0.9 % IV SOLN
250.0000 mg | Freq: Four times a day (QID) | INTRAVENOUS | Status: DC
  Administered 2020-07-13 – 2020-07-14 (×5): 250 mg via INTRAVENOUS
  Filled 2020-07-13 (×7): qty 5

## 2020-07-13 MED ORDER — TRACE MINERALS CU-MN-SE-ZN 300-55-60-3000 MCG/ML IV SOLN
INTRAVENOUS | Status: AC
  Filled 2020-07-13: qty 561.6

## 2020-07-13 NOTE — Progress Notes (Signed)
CRITICAL CARE NOTE 65 yo male with hx of severe peripheral vascular disease, dm, dyslipidemia who underwent elective Aortobifemoral bypass on 2020-10-15. Intraop he lost apx 2L blood with 1/2 returned via blood saver, received IVF fluids.  After surgery patient was in circulatory shock and was emergently brought to MICU due to medical instability.He required vasopressor support perioperatively.     Past Medical History:  Peripheral artery disease Myocardial infarction Hyperlipidemia Kidney stones Elevated liver enzyme Diabetes mellitus Coronary artery disease  Significant Hospital Events:  2/16: Underwent elective aortobifemoral bypass; intraoperative blood loss with hemorrhagic shock: Transferred to ICU post procedure remained intubated 2/17: AKI, nephrology consulted, temporary dialysis catheter placed 2/20 EXTUBATED 07/08/20 CODE BLUE, agitated, pulling lines, hit nurse. Became hypoxic and went PEA, ROSC 9 min 2/26 multiorgan failure +ileus 2/27 severe resp failure, multiorgan failure, daughter updated  Consults:  Vascular surgery Nephrology PCCM   Procedures:  2/16: Aortofemoral bypass 2/16: Right IJ CVC placed 2/17: Left IJ temporary dialysis catheter placed  Significant Diagnostic Tests:  2/17: Renal ultrasound>Question medical renal disease changes of both kidneys. No evidence of renal mass or hydronephrosis.Minimal ascites. 2/18: KUB>>Gas throughout mildly prominent large and small bowel loops may reflect mild ileus. NG tube in the stomach. 2/18: Echocardiogram>>LVEF 50-55% no wall motion abnl.  Micro Data:  2/14: SARS-CoV-2 PCR>> negative 2/16: MRSA PCR>> negative 2/16: Blood culture x2>> 2/18: Tracheal aspirate>>  Antimicrobials:  Cefazolin 2/16>> 2/17 (surgical prophylaxis). Unasyn 2/18>>2/22 Cefepime 2/22--> Flagyl 2/22-->   CC  follow up respiratory failure  SUBJECTIVE Patient remains critically ill Prognosis is guarded WBC 41 Creat  2.9 Na 150  Vent Mode: PCV FiO2 (%):  [35 %] 35 % Set Rate:  [20 bmp] 20 bmp PEEP:  [10 cmH20] 10 cmH20 Plateau Pressure:  [18 cmH20] 18 cmH20  CBC    Component Value Date/Time   WBC 41.2 (H) 07/13/2020 0504   RBC 2.80 (L) 07/13/2020 0504   HGB 7.9 (L) 07/13/2020 0504   HGB 14.7 06/04/2020 1441   HCT 25.0 (L) 07/13/2020 0504   HCT 45.6 06/04/2020 1441   PLT 181 07/13/2020 0504   PLT 320 06/04/2020 1441   MCV 89.3 07/13/2020 0504   MCV 88 06/04/2020 1441   MCV 87 07/02/2012 0722   MCH 28.2 07/13/2020 0504   MCHC 31.6 07/13/2020 0504   RDW 22.2 (H) 07/13/2020 0504   RDW 12.7 06/04/2020 1441   RDW 13.7 07/02/2012 0722   LYMPHSABS 0.7 07/11/2020 0622   LYMPHSABS 2.1 06/04/2020 1441   MONOABS 1.6 (H) 07/11/2020 0622   EOSABS 0.0 07/11/2020 0622   EOSABS 0.3 06/04/2020 1441   BASOSABS 0.2 (H) 07/11/2020 0622   BASOSABS 0.1 06/04/2020 1441   BMP Latest Ref Rng & Units 07/12/2020 07/12/2020 07/12/2020  Glucose 70 - 99 mg/dL - 960(A172(H) 540(J183(H)  BUN 8 - 23 mg/dL - 811(B122(H) 147(W116(H)  Creatinine 0.61 - 1.24 mg/dL - 2.95(A2.99(H) 2.13(Y2.89(H)  BUN/Creat Ratio 10 - 24 - - -  Sodium 135 - 145 mmol/L 150(H) 153(H) 154(H)  Potassium 3.5 - 5.1 mmol/L - 3.9 3.9  Chloride 98 - 111 mmol/L - 117(H) 118(H)  CO2 22 - 32 mmol/L - 27 27  Calcium 8.9 - 10.3 mg/dL - 6.9(L) 7.1(L)    BP (!) 122/54   Pulse 88   Temp 98.6 F (37 C)   Resp (!) 22   Ht 5\' 5"  (1.651 m)   Wt 77.2 kg   SpO2 96%   BMI 28.32 kg/m    I/O last  3 completed shifts: In: 6275.1 [I.V.:5015.3; Blood:340; Other:90; NG/GT:155; IV Piggyback:674.8] Out: 2230 [Urine:1085; Emesis/NG output:1100; Stool:45] No intake/output data recorded.  SpO2: 96 % O2 Flow Rate (L/min): 2 L/min FiO2 (%): 35 %  Estimated body mass index is 28.32 kg/m as calculated from the following:   Height as of this encounter:  (1.651 m).   Weight as of this encounter: 77.2 kg.  SIGNIFICANT EVENTS   REVIEW OF SYSTEMS  PATIENT IS UNABLE TO PROVIDE  COMPLETE REVIEW OF SYSTEMS DUE TO SEVERE CRITICAL ILLNESS   Pressure Injury 07/02/20 Heel Left Unstageable - Full thickness tissue loss in which the base of the injury is covered by slough (yellow, tan, gray, green or brown) and/or eschar (tan, brown or black) in the wound bed. (Active)  07/02/20 0100  Location: Heel  Location Orientation: Left  Staging: Unstageable - Full thickness tissue loss in which the base of the injury is covered by slough (yellow, tan, gray, green or brown) and/or eschar (tan, brown or black) in the wound bed.  Wound Description (Comments):   Present on Admission: Yes     Pressure Injury 07/04/20 Buttocks Right Deep Tissue Pressure Injury - Purple or maroon localized area of discolored intact skin or blood-filled blister due to damage of underlying soft tissue from pressure and/or shear. right lower buttocks purple (Active)  07/04/20 2100  Location: Buttocks  Location Orientation: Right  Staging: Deep Tissue Pressure Injury - Purple or maroon localized area of discolored intact skin or blood-filled blister due to damage of underlying soft tissue from pressure and/or shear.  Wound Description (Comments): right lower buttocks purple  Present on Admission:      Pressure Injury 07/05/20 Left Deep Tissue Pressure Injury - Purple or maroon localized area of discolored intact skin or blood-filled blister due to damage of underlying soft tissue from pressure and/or shear. maroon color/intact (Active)  07/05/20 0800  Location:   Location Orientation: Left  Staging: Deep Tissue Pressure Injury - Purple or maroon localized area of discolored intact skin or blood-filled blister due to damage of underlying soft tissue from pressure and/or shear.  Wound Description (Comments): maroon color/intact  Present on Admission:       PHYSICAL EXAMINATION:  GENERAL:critically ill appearing, +resp distress EYES: Pupils equal, round, reactive to light.  No scleral icterus.  MOUTH:  Moist mucosal membrane. NECK: Supple.  PULMONARY: +rhonchi, +wheezing CARDIOVASCULAR: S1 and S2. Regular rate and rhythm. No murmurs, rubs, or gallops.  GASTROINTESTINAL: Soft, nontender, -distended.  Positive bowel sounds.   MUSCULOSKELETAL: No swelling, clubbing, or edema.  NEUROLOGIC: obtunded, GCS<8 SKIN:intact,warm,dry  MEDICATIONS: I have reviewed all medications and confirmed regimen as documented   CULTURE RESULTS   Recent Results (from the past 240 hour(s))  Culture, Respiratory w Gram Stain     Status: None   Collection Time: 07/03/20  2:17 PM   Specimen: Tracheal Aspirate; Respiratory  Result Value Ref Range Status   Specimen Description   Final    TRACHEAL ASPIRATE Performed at [facility], [address], [place], [place] [address]    Special Requests   Final    NONE Performed at [facility], [address] [place], [place] [address]    Gram Stain   Final    FEW WBC PRESENT, PREDOMINANTLY PMN ABUNDANT GRAM NEGATIVE RODS Performed at [facility], [address] [place], [place] [address]    Culture   Final    ABUNDANT ESCHERICHIA COLI Confirmed Extended Spectrum Beta-Lactamase Producer (  ESBL).  In bloodstream infections from ESBL organisms, carbapenems are preferred over piperacillin/tazobactam. They are shown to have a lower risk of mortality.    Report Status 07/08/2020 FINAL  Final   Organism ID, Bacteria ESCHERICHIA COLI  Final      Susceptibility   Escherichia coli - MIC*    AMPICILLIN >=32 RESISTANT Resistant     CEFAZOLIN >=64 RESISTANT Resistant     CEFEPIME 8 INTERMEDIATE Intermediate     CEFTAZIDIME RESISTANT Resistant     CEFTRIAXONE >=64 RESISTANT Resistant     CIPROFLOXACIN >=4 RESISTANT Resistant     GENTAMICIN <=1 SENSITIVE Sensitive     IMIPENEM <=0.25 SENSITIVE Sensitive     TRIMETH/SULFA <=20 SENSITIVE Sensitive     AMPICILLIN/SULBACTAM >=32 RESISTANT Resistant     PIP/TAZO <=4 SENSITIVE Sensitive     *  ABUNDANT ESCHERICHIA COLI  Urine Culture     Status: None   Collection Time: 07/07/20 10:43 AM   Specimen: Urine, Random  Result Value Ref Range Status   Specimen Description   Final    URINE, RANDOM Performed at [facility], [address] [place], [place] [address]    Special Requests   Final    NONE Performed at [facility], [address] [place], [place] [address]    Culture   Final    NO GROWTH Performed at [facility], [address] [place], [place] [address]    Report Status 07/08/2020 FINAL  Final  Gastrointestinal Panel by PCR , Stool     Status: None   Collection Time: 07/07/20 12:05 PM   Specimen: Stool  Result Value Ref Range Status   Campylobacter species NOT DETECTED NOT DETECTED Final   Plesimonas shigelloides NOT DETECTED NOT DETECTED Final   Salmonella species NOT DETECTED NOT DETECTED Final   Yersinia enterocolitica NOT DETECTED NOT DETECTED Final   Vibrio species NOT DETECTED NOT DETECTED Final   Vibrio cholerae NOT DETECTED NOT DETECTED Final   Enteroaggregative E coli (EAEC) NOT DETECTED NOT DETECTED Final   Enteropathogenic E coli (EPEC) NOT DETECTED NOT DETECTED Final   Enterotoxigenic E coli (ETEC) NOT DETECTED NOT DETECTED Final   Shiga like toxin producing E coli (STEC) NOT DETECTED NOT DETECTED Final   Shigella/Enteroinvasive E coli (EIEC) NOT DETECTED NOT DETECTED Final   Cryptosporidium NOT DETECTED NOT DETECTED Final   Cyclospora cayetanensis NOT DETECTED NOT DETECTED Final   Entamoeba histolytica NOT DETECTED NOT DETECTED Final   Giardia lamblia NOT DETECTED NOT DETECTED Final   Adenovirus F40/41 NOT DETECTED NOT DETECTED Final   Astrovirus NOT DETECTED NOT DETECTED Final   Norovirus GI/GII NOT DETECTED NOT DETECTED Final   Rotavirus A NOT DETECTED NOT DETECTED Final   Sapovirus (I, II, IV, and V) NOT DETECTED NOT DETECTED Final    Comment: Performed at [facility], [address] [place],  [place] [address]  C Difficile Quick Screen w PCR reflex     Status: None   Collection Time: 07/07/20 12:05 PM  Result Value Ref Range Status   C Diff antigen NEGATIVE NEGATIVE Final   C Diff toxin NEGATIVE NEGATIVE Final   C Diff interpretation No C. difficile detected.  Final    Comment: Performed at [facility], [address] [place], [place] [address]  CULTURE, BLOOD (ROUTINE X 2) w Reflex to ID Panel     Status: None   Collection Time: 07/07/20  1:38 PM   Specimen: BLOOD  Result  Value Ref Range Status   Specimen Description BLOOD BLOOD LEFT HAND  Final   Special Requests   Final    BOTTLES DRAWN AEROBIC AND ANAEROBIC Blood Culture adequate volume   Culture   Final    NO GROWTH 5 DAYS Performed at [facility], [address] [place], [place] [address]    Report Status 07/12/2020 FINAL  Final  CULTURE, BLOOD (ROUTINE X 2) w Reflex to ID Panel     Status: None   Collection Time: 07/07/20  1:38 PM   Specimen: BLOOD  Result Value Ref Range Status   Specimen Description BLOOD BLOOD RIGHT HAND  Final   Special Requests   Final    BOTTLES DRAWN AEROBIC AND ANAEROBIC Blood Culture adequate volume   Culture   Final    NO GROWTH 5 DAYS Performed at [facility], [address], [place], [place] [address]    Report Status 07/12/2020 FINAL  Final  SARS CORONAVIRUS 2 (TAT 6-24 HRS) Nasopharyngeal Nasopharyngeal Swab     Status: None   Collection Time: 07/07/20  2:10 PM   Specimen: Nasopharyngeal Swab  Result Value Ref Range Status   SARS Coronavirus 2 NEGATIVE NEGATIVE Final    Comment: (NOTE) SARS-CoV-2 target nucleic acids are NOT DETECTED.  The SARS-CoV-2 RNA is generally detectable in upper and lower respiratory specimens during the acute phase of infection. Negative results do not preclude SARS-CoV-2 infection, do not rule out co-infections with other pathogens, and should not be used as the sole basis for treatment or other patient management  decisions. Negative results must be combined with clinical observations, patient history, and epidemiological information. The expected result is Negative.  Fact Sheet for Patients: HairSlick.no  Fact Sheet for Healthcare Providers: quierodirigir.com  This test is not yet approved or cleared by the [place] FDA and  has been authorized for detection and/or diagnosis of SARS-CoV-2 by FDA under an Emergency Use Authorization (EUA). This EUA will remain  in effect (meaning this test can be used) for the duration of the COVID-19 declaration under Se ction 564(b)(1) of the Act, 21 U.S.C. section 360bbb-3(b)(1), unless the authorization is terminated or revoked sooner.  Performed at [facility], [address] [place], [place] [address]           IMAGING    No results found.   Nutrition Status: Nutrition Problem: Inadequate oral intake Etiology: inability to eat Signs/Symptoms: NPO status Interventions: Tube feeding     Indwelling Urinary Catheter continued, requirement due to   Reason to continue Indwelling Urinary Catheter strict Intake/Output monitoring for hemodynamic instability   Central Line/ continued, requirement due to  Reason to continue [employer] Monitoring of central venous pressure or other hemodynamic parameters and poor IV access   Ventilator continued, requirement due to severe respiratory failure   Ventilator Sedation RASS 0 to -2      ASSESSMENT AND PLAN SYNOPSIS 65 yo WM  hx of severe peripheral vascular disease, dm, dyslipidemia who underwent elective Aortobifemoral bypass on Jul 05, 2020 admitted to ICU for acute blood loss and shock, extubated but complicated by severe sudden cardiac death on 07/12/2022 now with multiorgan failure-acute systolic heart failure/NSTEMI with progressive renal failure  Severe ACUTE Hypoxic and Hypercapnic Respiratory Failure -continue Full MV  support -continue Bronchodilator Therapy -Wean Fio2 and PEEP as tolerated -VAP/VENT bundle implementation  ACUTE NSTEMI Echo noted   ACUTE KIDNEY INJURY/Renal Failure -continue Foley Catheter-assess need -Avoid nephrotoxic agents -Follow urine output, BMP -Ensure  adequate renal perfusion, optimize oxygenation -Renal dose medications May need HD     NEUROLOGY Acute toxic metabolic encephalopathy, need for sedation Goal RASS -2 to -3 Will obtain MRI to assess for brain damage   CARDIAC ICU monitoring  ID -continue IV abx as prescibed -follow up cultures  GI GI PROPHYLAXIS as indicated  DIET-->NPO Constipation protocol as indicated  ENDO - will use ICU hypoglycemic\Hyperglycemia protocol if indicated     ELECTROLYTES -follow labs as needed -replace as needed -pharmacy consultation and following   DVT/GI PRX ordered and assessed TRANSFUSIONS AS NEEDED MONITOR FSBS I Assessed the need for Labs I Assessed the need for Foley I Assessed the need for Central Venous Line Family Discussion when available I Assessed the need for Mobilization I made an Assessment of medications to be adjusted accordingly Safety Risk assessment completed   CASE DISCUSSED IN MULTIDISCIPLINARY ROUNDS WITH ICU TEAM  Critical Care Time devoted to patient care services described in this note is 57 minutes.   Overall, patient is critically ill, prognosis is guarded.  Patient with Multiorgan failure and at high risk for cardiac arrest and death.    Lucie Leather, M.D.  [facility]  Medical Director [facility] Medical Director [facility] Cardio-Pulmonary Department

## 2020-07-13 NOTE — Progress Notes (Signed)
Daily Progress Note   Patient Name: Ivan Larsen       Date: 07/13/2020 DOB: 1955/07/18  Age: 65 y.o. MRN#: [id number] Attending Physician: Flora Lipps, MD Primary Care Physician: Venita Lick, NP Admit Date: 06/22/2020  Reason for Consultation/Follow-up: Establishing goals of care  Subjective: Patient is resting in bed on ventilator. Daughter and patient's sister are at bedside. She discusses that patient was completely independent prior to this hospitalization, and just had a sore on his heel that would not heal. She states the doctor had mentioned chance of amputation, but they were unsure of the outcome of the bypass.  She states her father would never want to live in a facility and have people care for him. Discussed his status in detail and how the issues can affect his outpcomes. She discusses her mother and her previous tracheostomy, and states she is wiling to trach her father to give him time to see how he does. Discussed the importance of determining exactly where the boundary lines are for an acceptable QOL, and then determining acceptable time limits to reach various goals including weaning from ventilator. Family will continue to talk.   Length of Stay: 12  Current Medications: Scheduled Meds:  . aspirin EC  81 mg Oral Q0600  . chlorhexidine gluconate (MEDLINE KIT)  15 mL Mouth Rinse BID  . Chlorhexidine Gluconate Cloth  6 each Topical Once  . collagenase  1 application Topical Daily  . docusate  100 mg Per Tube Daily  . hydrocortisone sod succinate (SOLU-CORTEF) inj  50 mg Intravenous Q12H  . mouth rinse  15 mL Mouth Rinse 10 times per day  . pantoprazole (PROTONIX) IV  40 mg Intravenous Q12H    Continuous Infusions: . sodium chloride Stopped (07/12/20 1602)  .  sodium chloride    . anidulafungin Stopped (07/12/20 2046)  . anticoagulant sodium citrate    . dextrose 100 mL/hr at 07/13/20 1100  . erythromycin 250 mg (07/13/20 1257)  . HYDROmorphone 2 mg/hr (07/13/20 1100)  . insulin 10.5 mL/hr at 07/13/20 1100  . magnesium sulfate bolus IVPB    . meropenem (MERREM) IV Stopped (07/13/20 1025)  . norepinephrine (LEVOPHED) Adult infusion Stopped (07/12/20 1455)  . propofol (DIPRIVAN) infusion 10 mcg/kg/min (07/13/20 0800)  . TPN ADULT (ION) 65 mL/hr at 07/13/20 1100  .  TPN ADULT (ION)      PRN Meds: sodium chloride, acetaminophen **OR** acetaminophen, alum & mag hydroxide-simeth, anticoagulant sodium citrate, HYDROmorphone (DILAUDID) injection, magnesium sulfate bolus IVPB, phenol, polyvinyl alcohol, senna-docusate, sorbitol, white petrolatum  Physical Exam Constitutional:      Comments: On ventilator.              Vital Signs: BP (!) 155/56   Pulse 92   Temp 99.5 F (37.5 C)   Resp (!) 26   Ht 5' 5" (1.651 m)   Wt 77.2 kg   SpO2 97%   BMI 28.32 kg/m  SpO2: SpO2: 97 % O2 Device: O2 Device: Ventilator O2 Flow Rate: O2 Flow Rate (L/min): 2 L/min  Intake/output summary:   Intake/Output Summary (Last 24 hours) at 07/13/2020 1427 Last data filed at 07/13/2020 1100 Gross per 24 hour  Intake 3920.69 ml  Output 1230 ml  Net 2690.69 ml   LBM: Last BM Date: 07/12/20 Baseline Weight: Weight: 63.5 kg Most recent weight: Weight: 77.2 kg       Flowsheet Rows   Flowsheet Row Most Recent Value  Intake Tab   Referral Department Hospitalist  Unit at Time of Referral Intermediate Care Unit  Palliative Care Primary Diagnosis Other (Comment)  Date Notified 07/06/20  Palliative Care Type New Palliative care  Reason for referral Clarify Goals of Care  Date of Admission 07/06/2020  Date first seen by Palliative Care 07/06/20  # of days Palliative referral response time 0 Day(s)  # of days IP prior to Palliative referral 5  Clinical Assessment    Palliative Performance Scale Score 20%  Pain Max last 24 hours Not able to report  Pain Min Last 24 hours Not able to report  Dyspnea Max Last 24 Hours Not able to report  Dyspnea Min Last 24 hours Not able to report  Psychosocial & Spiritual Assessment   Palliative Care Outcomes       Patient Active Problem List   Diagnosis Date Noted  . Acute renal failure with acute cortical necrosis (HCC)   . Acute respiratory failure with hypoxia (HCC)   . Nasogastric tube present   . Endotracheal tube present   . Liver function abnormality   . Cardiac arrest due to other underlying condition (HCC)   . Atherosclerosis of artery of extremity with ulceration (HCC) 07/12/2020  . S/P aorto-bifemoral bypass surgery 07/03/2020  . Atherosclerosis of native arteries of the extremities with ulceration (HCC) 06/16/2020  . MVP (mitral valve prolapse) 06/03/2020  . Peripheral vascular disease (HCC) 05/30/2020  . Type 2 diabetes mellitus with diabetic neuropathy, without long-term current use of insulin (HCC) 04/14/2020  . Diabetic ulcer of left heel associated with type 2 diabetes mellitus (HCC) 02/07/2020  . Aortic ectasia, abdominal (HCC) 10/11/2016  . Hypertension associated with diabetes (HCC) 09/29/2014  . Hyperlipidemia associated with type 2 diabetes mellitus (HCC) 04/08/2014  . Atherosclerotic heart disease of native coronary artery without angina pectoris 03/24/2013  . GERD (gastroesophageal reflux disease) 03/24/2013  . Stented coronary artery 03/24/2013  . Cataracts, bilateral 01/29/2013  . Cortical cataract of both eyes 01/29/2013  . PSC (posterior subcapsular cataract), bilateral 01/29/2013    Palliative Care Assessment & Plan   Recommendations/Plan:  Continuing with time trial. Trach and PEG okay for undefined trial, but per family patient would not want to "live" with these interventions.     Code Status:    Code Status Orders  (From admission, onward)           Start      Ordered   07/13/20 1236  Do not attempt resuscitation (DNR)  Continuous       Question Answer Comment  In the event of cardiac or respiratory ARREST Do not call a "code blue"   In the event of cardiac or respiratory ARREST Do not perform Intubation, CPR, defibrillation or ACLS   In the event of cardiac or respiratory ARREST Use medication by any route, position, wound care, and other measures to relive pain and suffering. May use oxygen, suction and manual treatment of airway obstruction as needed for comfort.      07/13/20 1236        Code Status History    Date Active Date Inactive Code Status Order ID Comments User Context   07/12/2020 1630 07/13/2020 1236 Full Code [id number]  Algernon Huxley, MD Inpatient   Advance Care Planning Activity       Prognosis:   Poor    Care plan was discussed with CCM  Thank you for allowing the Palliative Medicine Team to assist in the care of this patient.   Total Time 25 min Prolonged Time Billed  no      Greater than 50%  of this time was spent counseling and coordinating care related to the above assessment and plan.  Asencion Gowda, NP  Please contact Palliative Medicine Team phone at [phone] for questions and concerns.

## 2020-07-13 NOTE — Consult Note (Signed)
NAME: Ivan Larsen  DOB: 1956/01/01  MRN: [id number]  Date/Time: 07/13/2020 7:17 PM  REQUESTING PROVIDER: Temple Pacini Subjective:  REASON FOR CONSULT: leucocytosis ?Patient is intubated.  Chart reviewed  Ivan Larsen is a 64 y.o. male with a history of CAD, diabetes mellitus, CKD, hyperlipidemia, PAD Left foot wound,Underwent angiogram on 06/25/2020 and that revealed occlusion of the right common femoral artery and proximal superficial femoral artery with dense calcific stenosis.  The left common femoral artery and proximal SFA were occluded and imaging could not be performed as access was unobtainable.  So on 06/28/2020 he underwent an elective aortobifemoral bypass.  Had left common femoral and superficial femoral artery endarterectomies.  Right common femoral and superficial femoral artery endarterectomies.  Extensive bilateral renal endarterectomies.  Proximal infrarenal aorta endarterectomy.  Intraoperatively he lost about 2 L blood with half returned via blood saver.  Received IV fluids.  As he was medically unstable and in circulatory shock he was transferred to the ICU intubated. He had a hemodialysis catheter placed because of acute kidney injury and received first dialysis on 07/05/2020..  I am seeing the patient for persistent leucocytosis  Past Medical History:  Diagnosis Date  . CAD (coronary artery disease)   . Diabetes mellitus without complication ([place])   . Elevated liver enzymes   . History of kidney stones   . Hyperlipidemia   . MI (myocardial infarction) ([place]) 07/01/12  . PAD (peripheral artery disease) ([place])     Past Surgical History:  Procedure Laterality Date  . AORTA - BILATERAL FEMORAL ARTERY BYPASS GRAFT N/A 06/19/2020   Procedure: AORTA BIFEMORAL BYPASS GRAFT (VERSUS BILATERAL FEMORAL ENDARTERECTOMIES);  Surgeon: Algernon Huxley, MD;  Location: [facility];  Service: Vascular;  Laterality: N/A;  . CORONARY STENT PLACEMENT    . EYE SURGERY    . LOWER EXTREMITY  ANGIOGRAPHY Left 06/25/2020   Procedure: LOWER EXTREMITY ANGIOGRAPHY;  Surgeon: Algernon Huxley, MD;  Location: [facility];  Service: Cardiovascular;  Laterality: Left;    Social History   Socioeconomic History  . Marital status: Married    Spouse name: Not on file  . Number of children: Not on file  . Years of education: Not on file  . Highest education level: Not on file  Occupational History  . Not on file  Tobacco Use  . Smoking status: Former Smoker    Types: Cigarettes    Quit date: 01/15/2004    Years since quitting: 16.5  . Smokeless tobacco: Never Used  Vaping Use  . Vaping Use: Never used  Substance and Sexual Activity  . Alcohol use: Yes  . Drug use: No  . Sexual activity: Not on file  Other Topics Concern  . Not on file  Social History Narrative  . Not on file   Social Determinants of Health   Financial Resource Strain: Not on file  Food Insecurity: Not on file  Transportation Needs: Not on file  Physical Activity: Not on file  Stress: Not on file  Social Connections: Not on file  Intimate Partner Violence: Not on file    Family History  Problem Relation Age of Onset  . Diabetes Mother   . Heart attack Father    Allergies  Allergen Reactions  . Iodinated Diagnostic Agents Nausea Only    Other reaction(s): Vomiting  . Iodine   . Shellfish Allergy Nausea And Vomiting and Other (See Comments)    Sweating  Sweating    . Shellfish-Derived Products Nausea And Vomiting  and Other (See Comments)    Other reaction(s): Nausea And Vomiting, Other (See Comments) Sweating  Sweating    I? Current Facility-Administered Medications  Medication Dose Route Frequency Provider Last Rate Last Admin  . 0.9 %  sodium chloride infusion   Intravenous Continuous Algernon Huxley, MD   Stopped at 07/12/20 [phone]  . 0.9 %  sodium chloride infusion  500 mL Intravenous Once PRN Algernon Huxley, MD      . acetaminophen (TYLENOL) tablet 325-650 mg  325-650 mg Oral Q4H PRN Algernon Huxley, MD   650 mg at 07/04/20 1558   Or  . acetaminophen (TYLENOL) suppository 325-650 mg  325-650 mg Rectal Q4H PRN Algernon Huxley, MD      . alum & mag hydroxide-simeth (MAALOX/MYLANTA) 200-200-20 MG/5ML suspension 15-30 mL  15-30 mL Oral Q2H PRN Algernon Huxley, MD      . anidulafungin (ERAXIS) 100 mg in sodium chloride 0.9 % 100 mL IVPB  100 mg Intravenous Q24H Nelle Don, MD 78 mL/hr at 07/13/20 1843 100 mg at 07/13/20 1843  . anticoagulant sodium citrate solution 5 mL  5 mL Intracatheter PRN Awilda Bill, NP      . aspirin EC tablet 81 mg  81 mg Oral Q0600 Algernon Huxley, MD   81 mg at 07/13/20 0516  . chlorhexidine gluconate (MEDLINE KIT) (PERIDEX) 0.12 % solution 15 mL  15 mL Mouth Rinse BID Nelle Don, MD   15 mL at 07/13/20 0753  . Chlorhexidine Gluconate Cloth 2 % PADS 6 each  6 each Topical Once Dew, Erskine Squibb, MD      . collagenase (SANTYL) ointment 1 application  1 application Topical Daily Algernon Huxley, MD   1 application at 78/46/96 1050  . dextrose 5 % solution   Intravenous Continuous Lateef, Munsoor, MD 75 mL/hr at 07/13/20 1800 Infusion Verify at 07/13/20 1800  . docusate (COLACE) 50 MG/5ML liquid 100 mg  100 mg Per Tube Daily Algernon Huxley, MD   100 mg at 07/13/20 0951  . erythromycin 250 mg in sodium chloride 0.9 % 100 mL IVPB  250 mg Intravenous Q6H Flora Lipps, MD 100 mL/hr at 07/13/20 1800 Infusion Verify at 07/13/20 1800  . hydrocortisone sodium succinate (SOLU-CORTEF) 100 MG injection 50 mg  50 mg Intravenous Q12H Rust-Chester, Britton L, NP   50 mg at 07/13/20 1600  . HYDROmorphone (DILAUDID) 50 mg in sodium chloride 0.9 % 100 mL (0.5 mg/mL) infusion  1-4 mg/hr Intravenous Continuous Rust-Chester, Britton L, NP 4 mL/hr at 07/13/20 1800 2 mg/hr at 07/13/20 1800  . HYDROmorphone (DILAUDID) injection 1 mg  1 mg Intravenous Q2H PRN Nelle Don, MD   1 mg at 07/13/20 1440  . insulin regular, human (MYXREDLIN) 100 units/ 100 mL infusion   Intravenous Continuous  Nelle Don, MD 15 mL/hr at 07/13/20 1847 15 Units/hr at 07/13/20 1847  . magnesium sulfate IVPB 2 g 50 mL  2 g Intravenous Daily PRN Algernon Huxley, MD      . MEDLINE mouth rinse  15 mL Mouth Rinse 10 times per day Nelle Don, MD   15 mL at 07/13/20 1755  . meropenem (MERREM) 1 g in sodium chloride 0.9 % 100 mL IVPB  1 g Intravenous Q12H Flora Lipps, MD   Stopped at 07/13/20 1025  . norepinephrine (LEVOPHED) 16 mg in 255m premix infusion  0-40 mcg/min Intravenous Titrated Rust-Chester, BHuel Cote NP   Stopped at 07/12/20 1455  .  pantoprazole (PROTONIX) injection 40 mg  40 mg Intravenous Q12H Nelle Don, MD   40 mg at 07/13/20 0951  . phenol (CHLORASEPTIC) mouth spray 1 spray  1 spray Mouth/Throat PRN Dew, Erskine Squibb, MD      . polyvinyl alcohol (LIQUIFILM TEARS) 1.4 % ophthalmic solution 1 drop  1 drop Both Eyes PRN Nelle Don, MD   1 drop at 07/11/20 1712  . propofol (DIPRIVAN) 1000 MG/100ML infusion  5-80 mcg/kg/min Intravenous Titrated Rust-Chester, Britton L, NP 4.44 mL/hr at 07/13/20 1606 10 mcg/kg/min at 07/13/20 1606  . senna-docusate (Senokot-S) tablet 1 tablet  1 tablet Oral QHS PRN Algernon Huxley, MD      . sorbitol 70 % solution 30 mL  30 mL Oral Daily PRN Lucky Cowboy, Erskine Squibb, MD      . TPN ADULT (ION)   Intravenous Continuous TPN Flora Lipps, MD 65 mL/hr at 07/13/20 1800 Infusion Verify at 07/13/20 1800  . white petrolatum (VASELINE) gel   Topical PRN Rust-Chester, Huel Cote, NP   1 application at 26/83/41 1104     Abtx:  Anti-infectives (From admission, onward)   Start     Dose/Rate Route Frequency Ordered Stop   07/13/20 1200  erythromycin 250 mg in sodium chloride 0.9 % 100 mL IVPB        250 mg 100 mL/hr over 60 Minutes Intravenous Every 6 hours 07/13/20 1023     07/12/20 1815  anidulafungin (ERAXIS) 100 mg in sodium chloride 0.9 % 100 mL IVPB       "Followed by" Linked Group Details   100 mg 78 mL/hr over 100 Minutes Intravenous Every 24 hours 07/11/20 1717      07/12/20 1100  meropenem (MERREM) 1 g in sodium chloride 0.9 % 100 mL IVPB        1 g 200 mL/hr over 30 Minutes Intravenous Every 12 hours 07/12/20 1009 07/19/20 0959   07/11/20 1815  anidulafungin (ERAXIS) 200 mg in sodium chloride 0.9 % 200 mL IVPB       "Followed by" Linked Group Details   200 mg 78 mL/hr over 200 Minutes Intravenous  Once 07/11/20 1717 07/11/20 2154   07/11/20 1800  ceFEPIme (MAXIPIME) 2 g in sodium chloride 0.9 % 100 mL IVPB  Status:  Discontinued        2 g 200 mL/hr over 30 Minutes Intravenous Every 24 hours 07/11/20 1714 07/12/20 1009   07/11/20 1800  metroNIDAZOLE (FLAGYL) IVPB 500 mg  Status:  Discontinued        500 mg 100 mL/hr over 60 Minutes Intravenous Every 8 hours 07/11/20 1714 07/12/20 1009   07/11/20 1800  vancomycin (VANCOCIN) 50 mg/mL oral solution 125 mg  Status:  Discontinued        125 mg Oral 4 times daily 07/11/20 1714 07/13/20 1023   07/10/20 1200  piperacillin-tazobactam (ZOSYN) IVPB 3.375 g  Status:  Discontinued        3.375 g 12.5 mL/hr over 240 Minutes Intravenous Every 8 hours 07/10/20 0815 07/11/20 1714   07/08/20 1400  piperacillin-tazobactam (ZOSYN) IVPB 2.25 g  Status:  Discontinued        2.25 g 100 mL/hr over 30 Minutes Intravenous Every 8 hours 07/08/20 1257 07/10/20 0815   07/07/20 1400  ceFEPIme (MAXIPIME) 1 g in sodium chloride 0.9 % 100 mL IVPB  Status:  Discontinued        1 g 200 mL/hr over 30 Minutes Intravenous Every 8 hours 07/07/20 0940 07/07/20 1003  07/07/20 1200  vancomycin (VANCOCIN) 50 mg/mL oral solution 125 mg  Status:  Discontinued        125 mg Oral Every 6 hours 07/07/20 0938 07/07/20 0940   07/07/20 1200  vancomycin (VANCOCIN) 500 mg in sodium chloride irrigation 0.9 % 100 mL ENEMA  Status:  Discontinued        500 mg Rectal Every 6 hours 07/07/20 0951 07/08/20 0858   07/07/20 1200  ceFEPIme (MAXIPIME) 2 g in sodium chloride 0.9 % 100 mL IVPB  Status:  Discontinued        2 g 200 mL/hr over 30 Minutes  Intravenous Every 24 hours 07/07/20 1003 07/08/20 1253   07/07/20 1030  metroNIDAZOLE (FLAGYL) IVPB 500 mg  Status:  Discontinued        500 mg 100 mL/hr over 60 Minutes Intravenous Every 8 hours 07/07/20 0940 07/08/20 1253   07/06/20 1800  Ampicillin-Sulbactam (UNASYN) 3 g in sodium chloride 0.9 % 100 mL IVPB  Status:  Discontinued        3 g 200 mL/hr over 30 Minutes Intravenous Every 24 hours 07/06/20 1018 07/07/20 0959   07/03/20 2200  Ampicillin-Sulbactam (UNASYN) 3 g in sodium chloride 0.9 % 100 mL IVPB  Status:  Discontinued        3 g 200 mL/hr over 30 Minutes Intravenous Every 12 hours 07/03/20 1230 07/06/20 1018   07/03/20 0400  Ampicillin-Sulbactam (UNASYN) 3 g in sodium chloride 0.9 % 100 mL IVPB  Status:  Discontinued        3 g 200 mL/hr over 30 Minutes Intravenous Every 6 hours 07/03/20 0314 07/03/20 1230   07/13/2020 2200  ceFAZolin (ANCEF) IVPB 2g/100 mL premix        2 g 200 mL/hr over 30 Minutes Intravenous Every 8 hours 07/04/2020 1630 07/02/20 0716   06/30/2020 0702  ceFAZolin (ANCEF) 2-4 GM/100ML-% IVPB       Note to Pharmacy: Register, Karen   : cabinet override      07/10/2020 0702 07/10/2020 0916   07/05/2020 0600  ceFAZolin (ANCEF) IVPB 2g/100 mL premix        2 g 200 mL/hr over 30 Minutes Intravenous On call to O.R. 06/30/20 2354 06/26/2020 1340      REVIEW OF SYSTEMS:  NA  LDA Foley 06/22/2020 NG 06/24/2020 Rt IJ triple lumen 2/16 ETT 2/16>>2/19 07/08/20  Objective:  VITALS:  BP (!) 114/49   Pulse 83   Temp 99.86 F (37.7 C)   Resp (!) 26   Ht _0  (1.651 m)   Wt 77.2 kg   SpO2 98%   BMI 28.32 kg/m  PHYSICAL EXAM:  General: Intubated sedated Head: Normocephalic, without obvious abnormality, atraumatic. Eyes: Conjunctivae clear, anicteric sclerae. Pupils are equal ENT NG tube.  Has bilious fluid in the canister  oral cavity cannot be examined Neck: Right triple-lumen  Back: Did not assess. Lungs: Bilateral air entry.  Decreased bases.Marland Kitchen Heart:  S1-S2 Abdomen: Laparotomy to me.  Staples present.  Abdomen is distended.   Extremities: Right femoral area staples stretched.  Edema both legs Bilateral knee effusion Left heel wound Penis tear of the superficial skin Scrotal edema Skin: No rashes or lesions. Or bruising Neurologic: Cannot assess Pertinent Labs Lab Results     CBC    Component Value Date/Time   WBC 41.2 (H) 07/13/2020 0504   RBC 2.80 (L) 07/13/2020 0504   HGB 7.9 (L) 07/13/2020 0504   HGB 14.7 06/04/2020 1441   HCT 25.0 (L)  07/13/2020 0504   HCT 45.6 06/04/2020 1441   PLT 181 07/13/2020 0504   PLT 320 06/04/2020 1441   MCV 89.3 07/13/2020 0504   MCV 88 06/04/2020 1441   MCV 87 07/02/2012 0722   MCH 28.2 07/13/2020 0504   MCHC 31.6 07/13/2020 0504   RDW 22.2 (H) 07/13/2020 0504   RDW 12.7 06/04/2020 1441   RDW 13.7 07/02/2012 0722   LYMPHSABS 0.7 07/11/2020 0622   LYMPHSABS 2.1 06/04/2020 1441   MONOABS 1.6 (H) 07/11/2020 0622   EOSABS 0.0 07/11/2020 0622   EOSABS 0.3 06/04/2020 1441   BASOSABS 0.2 (H) 07/11/2020 0622   BASOSABS 0.1 06/04/2020 1441    CMP Latest Ref Rng & Units 07/13/2020 07/12/2020 07/12/2020  Glucose 70 - 99 mg/dL 183(H) - 172(H)  BUN 8 - 23 mg/dL 136(H) - 122(H)  Creatinine 0.61 - 1.24 mg/dL 3.40(H) - 2.99(H)  Sodium 135 - 145 mmol/L 148(H) 150(H) 153(H)  Potassium 3.5 - 5.1 mmol/L 4.6 - 3.9  Chloride 98 - 111 mmol/L 113(H) - 117(H)  CO2 22 - 32 mmol/L 24 - 27  Calcium 8.9 - 10.3 mg/dL 6.8(L) - 6.9(L)  Total Protein 6.5 - 8.1 g/dL 4.5(L) - -  Total Bilirubin 0.3 - 1.2 mg/dL 1.3(H) - -  Alkaline Phos 38 - 126 U/L 85 - -  AST 15 - 41 U/L 69(H) - -  ALT 0 - 44 U/L 16 - -    Cr 0.9>>5.20>>3.4  Microbiology: Recent Results (from the past 240 hour(s))  Urine Culture     Status: None   Collection Time: 07/07/20 10:43 AM   Specimen: Urine, Random  Result Value Ref Range Status   Specimen Description   Final    URINE, RANDOM Performed at [facility], [address], [place], [place] [address]    Special Requests   Final    NONE Performed at [facility], [address], [place], [place] [address]    Culture   Final    NO GROWTH Performed at [facility], [place] [address], [place], [place] [address]    Report Status 07/08/2020 FINAL  Final  Gastrointestinal Panel by PCR , Stool     Status: None   Collection Time: 07/07/20 12:05 PM   Specimen: Stool  Result Value Ref Range Status   Campylobacter species NOT DETECTED NOT DETECTED Final   Plesimonas shigelloides NOT DETECTED NOT DETECTED Final   Salmonella species NOT DETECTED NOT DETECTED Final   Yersinia enterocolitica NOT DETECTED NOT DETECTED Final   Vibrio species NOT DETECTED NOT DETECTED Final   Vibrio cholerae NOT DETECTED NOT DETECTED Final   Enteroaggregative E coli (EAEC) NOT DETECTED NOT DETECTED Final   Enteropathogenic E coli (EPEC) NOT DETECTED NOT DETECTED Final   Enterotoxigenic E coli (ETEC) NOT DETECTED NOT DETECTED Final   Shiga like toxin producing E coli (STEC) NOT DETECTED NOT DETECTED Final   Shigella/Enteroinvasive E coli (EIEC) NOT DETECTED NOT DETECTED Final   Cryptosporidium NOT DETECTED NOT DETECTED Final   Cyclospora cayetanensis NOT DETECTED NOT DETECTED Final   Entamoeba histolytica NOT DETECTED NOT DETECTED Final   Giardia lamblia NOT DETECTED NOT DETECTED Final   Adenovirus F40/41 NOT DETECTED NOT DETECTED Final   Astrovirus NOT DETECTED NOT DETECTED Final   Norovirus GI/GII NOT DETECTED NOT DETECTED Final   Rotavirus A NOT DETECTED NOT DETECTED Final   Sapovirus (I, II, IV, and V) NOT DETECTED NOT DETECTED Final    Comment: Performed at  [facility], [place], [place] [address]  C Difficile Quick Screen w PCR reflex     Status: None   Collection Time: 07/07/20 12:05 PM  Result Value Ref Range Status   C Diff antigen NEGATIVE NEGATIVE Final   C Diff toxin NEGATIVE NEGATIVE Final   C Diff interpretation No C.  difficile detected.  Final    Comment: Performed at [facility], [address] [place], [place] [address]  CULTURE, BLOOD (ROUTINE X 2) w Reflex to ID Panel     Status: None   Collection Time: 07/07/20  1:38 PM   Specimen: BLOOD  Result Value Ref Range Status   Specimen Description BLOOD BLOOD LEFT HAND  Final   Special Requests   Final    BOTTLES DRAWN AEROBIC AND ANAEROBIC Blood Culture adequate volume   Culture   Final    NO GROWTH 5 DAYS Performed at [facility], [address] [place], [place] [address]    Report Status 07/12/2020 FINAL  Final  CULTURE, BLOOD (ROUTINE X 2) w Reflex to ID Panel     Status: None   Collection Time: 07/07/20  1:38 PM   Specimen: BLOOD  Result Value Ref Range Status   Specimen Description BLOOD BLOOD RIGHT HAND  Final   Special Requests   Final    BOTTLES DRAWN AEROBIC AND ANAEROBIC Blood Culture adequate volume   Culture   Final    NO GROWTH 5 DAYS Performed at [facility], [address], [place], [place] [address]    Report Status 07/12/2020 FINAL  Final  SARS CORONAVIRUS 2 (TAT 6-24 HRS) Nasopharyngeal Nasopharyngeal Swab     Status: None   Collection Time: 07/07/20  2:10 PM   Specimen: Nasopharyngeal Swab  Result Value Ref Range Status   SARS Coronavirus 2 NEGATIVE NEGATIVE Final    Comment: (NOTE) SARS-CoV-2 target nucleic acids are NOT DETECTED.  The SARS-CoV-2 RNA is generally detectable in upper and lower respiratory specimens during the acute phase of infection. Negative results do not preclude SARS-CoV-2 infection, do not rule out co-infections with other pathogens, and should not be used as the sole basis for treatment or other patient management decisions. Negative results must be combined with clinical observations, patient history, and epidemiological information. The expected result is Negative.  Fact Sheet for Patients: SugarRoll.be  Fact Sheet for  Healthcare Providers: https://www.woods-mathews.com/  This test is not yet approved or cleared by the [place] FDA and  has been authorized for detection and/or diagnosis of SARS-CoV-2 by FDA under an Emergency Use Authorization (EUA). This EUA will remain  in effect (meaning this test can be used) for the duration of the COVID-19 declaration under Se ction 564(b)(1) of the Act, 21 U.S.C. section 360bbb-3(b)(1), unless the authorization is terminated or revoked sooner.  Performed at [facility], [place] [address], [place], [place] [address]   Micro 2/16 BC NG 2/22 BC NG 2/22 UC NG 2/22 tracheal aspirate ESBL e.coli  Stool 07/07/20 Cdiff negative  Antibiotic use cefazolin2/16/2022  Unasyn 07/03/2020 >>07/06/2020  Cefepime 07/07/2020>>07/12/20 Metronidazole 2/22 >>07/12/20  Anidulafungin 07/11/2020>> Meropenem 2/27>>  IMAGING RESULTS:  I have personally reviewed the films CT abdomen/pelvis 07/08/20?  There are bilateral pleural effusions, left greater than right, volume estimated less than 1 L on the left. Bilateral lower lobe consolidation, with dense left lower lobe consolidation ikely representing atelectasis. Diffuse airspace disease throughout the right lower lobe could reflect infection or aspiration  Mildly distended loops of jejunum within the left mid abdomen, with scattered gas fluid levels, likely reflect postoperative ileus. Normal retrocecal appendix. Enteric catheter extends through the stomach, tip in the proximal duodenum.  Impression/Recommendation ?Patient underwent elective aortobifemoral bypass complicated by 2 L blood loss resulting in circulatory shock, intubation, AKI, dialysis  Severe leukocytosis/leukemoid reaction Need to rule out ischemia of the bowels  versus abscess.  C. difficile negative  Bilious fluid in the NG tube.  Rule out ileus versus SBO Recommend  CT abdomen and pelvis and CT chest  Continue meropenem and  anidulafungin.  ESBL E. coli in the tracheal aspirate.  Possibility of aspiration pneumonia.  Agree with meropenem.  Severe anemia secondary to blood loss.  Status post transfusion   ? AKI received 1 dialysis.  Currently on hold. ___________________________________________________ Discussed with care team. Note:  This document was prepared using Dragon voice recognition software and may include unintentional dictation errors.

## 2020-07-13 NOTE — Progress Notes (Signed)
[facility]  ROUNDING NOTE   Subjective:  Urine output 510 cc over the preceding 24 hours. Remains critically ill. Still on the ventilator.    Objective:   Vitals:   07/13/20 0830 07/13/20 0845  BP: (!) 90/47 (!) 118/53  Pulse: 78 79  Resp: (!) 26 (!) 26  Temp: 98.78 F (37.1 C) 98.78 F (37.1 C)  SpO2: 96% 96%     Weight change: 1.7 kg Filed Weights   07/11/20 0500 07/12/20 0500 07/13/20 0500  Weight: 74 kg 75.5 kg 77.2 kg    Intake/Output: I/O last 3 completed shifts: In: 6726.8 [I.V.:5467; Blood:340; Other:90; NG/GT:155; IV Piggyback:674.8] Out: 2230 [Urine:1085; Emesis/NG output:1100; Stool:45]   Intake/Output this shift:  Total I/O In: 219.9 [I.V.:189.9; NG/GT:30] Out: 65 [Urine:65]  Physical Exam: General: Critically ill  HEENT: ETT in place  Lungs:  Scattered rhonchi, normal effort  Heart: Regular rate and rhythm  Abdomen:  +distended, dressings clean, decreased sounds  Extremities: 2+ LE edema  Neurologic: Sedated on the vent  Skin: No lesions  Access: None at this time  Foley in place  Basic Metabolic Panel: Recent Labs  Lab 07/09/20 2107 07/10/20 0312 07/11/20 0424 07/11/20 0622 07/12/20 0004 07/12/20 0416 07/12/20 0850 07/12/20 1207 07/12/20 1550 07/12/20 2240 07/13/20 0504  NA  --  156* 161*   < > 156* 154* 154* 154* 153* 150*  --   K 3.3* 3.4* 3.2*   < > 3.6 3.5 3.9 3.9 3.9  --   --   CL  --  117* 123*   < > 119* 117* 118* 118* 117*  --   --   CO2  --  29 28   < > 26 26 26 27 27   --   --   GLUCOSE  --  457* 174*   < > 334* 241* 197* 183* 172*  --   --   BUN  --  94* 84*   < > 106* 113* 112* 116* 122*  --   --   CREATININE  --  2.88* 2.12*   < > 2.55* 2.64* 2.90* 2.89* 2.99*  --   --   CALCIUM  --  7.2* 7.6*   < > 7.2* 7.0* 7.1* 7.1* 6.9*  --   --   MG 2.1 2.1 2.0  --   --  1.8  --   --   --   --  2.0  PHOS 1.4* 3.4 2.0*  --   --  3.2  --   --   --   --  5.1*   < > = values in this interval not displayed.     Liver Function Tests: Recent Labs  Lab 07/08/20 0814 07/11/20 0424  AST 182* 60*  ALT 45* 20  ALKPHOS 171* 112  BILITOT 1.9* 1.4*  PROT 4.6* 4.9*  ALBUMIN 1.7* 2.2*   No results for input(s): LIPASE, AMYLASE in the last 168 hours. No results for input(s): AMMONIA in the last 168 hours.  CBC: Recent Labs  Lab 07/08/20 0613 07/09/20 0425 07/11/20 0622 07/11/20 0933 07/12/20 0416 07/12/20 1207 07/13/20 0504  WBC 20.5*   < > 57.5* 54.2* 49.5* 49.9* 41.2*  NEUTROABS 15.6*  --  53.3*  --   --   --   --   HGB 7.9*   < > 9.1* 9.5* 8.0* 7.5* 7.9*  HCT 24.3*   < > 29.1* 29.5* 25.0* 24.3* 25.0*  MCV 85.3   < > 89.8 89.7 89.6  91.7 89.3  PLT 127*   < > 187 182 190 193 181   < > = values in this interval not displayed.    Cardiac Enzymes: No results for input(s): CKTOTAL, CKMB, CKMBINDEX, TROPONINI in the last 168 hours.  BNP: Invalid input(s): POCBNP  CBG: Recent Labs  Lab 07/13/20 0017 07/13/20 0210 07/13/20 0409 07/13/20 0613 07/13/20 0802  GLUCAP 149* 168* 175* 172* 174*    Microbiology: Results for orders placed or performed during the hospital encounter of 07/10/2020  MRSA PCR Screening     Status: None   Collection Time: 06/22/2020  4:32 PM   Specimen: Nasopharyngeal  Result Value Ref Range Status   MRSA by PCR NEGATIVE NEGATIVE Final    Comment:        The GeneXpert MRSA Assay (FDA approved for NASAL specimens only), is one component of a comprehensive MRSA colonization surveillance program. It is not intended to diagnose MRSA infection nor to guide or monitor treatment for MRSA infections. Performed at [facility], [address] [place], [place] [address]   CULTURE, BLOOD (ROUTINE X 2) w Reflex to ID Panel     Status: None   Collection Time: 07/02/2020  6:07 PM   Specimen: BLOOD  Result Value Ref Range Status   Specimen Description BLOOD BLOOD RIGHT HAND  Final   Special Requests   Final    BOTTLES DRAWN AEROBIC AND ANAEROBIC Blood  Culture results may not be optimal due to an inadequate volume of blood received in culture bottles   Culture   Final    NO GROWTH 5 DAYS Performed at [facility], [address] [place], [place] [address]    Report Status 07/06/2020 FINAL  Final  CULTURE, BLOOD (ROUTINE X 2) w Reflex to ID Panel     Status: None   Collection Time: 06/16/2020  6:07 PM   Specimen: BLOOD  Result Value Ref Range Status   Specimen Description BLOOD LEFT THUMB  Final   Special Requests   Final    BOTTLES DRAWN AEROBIC AND ANAEROBIC Blood Culture adequate volume   Culture   Final    NO GROWTH 5 DAYS Performed at [facility], [address] [place], [place] [address]    Report Status 07/06/2020 FINAL  Final  Culture, Respiratory w Gram Stain     Status: None   Collection Time: 07/03/20  2:17 PM   Specimen: Tracheal Aspirate; Respiratory  Result Value Ref Range Status   Specimen Description   Final    TRACHEAL ASPIRATE Performed at [facility], [address] [place], [place] [address]    Special Requests   Final    NONE Performed at [facility], [address] [place], [place] [address]    Gram Stain   Final    FEW WBC PRESENT, PREDOMINANTLY PMN ABUNDANT GRAM NEGATIVE RODS Performed at [facility], [place] [address], [place], [place] [address]    Culture   Final    ABUNDANT ESCHERICHIA COLI Confirmed Extended Spectrum Beta-Lactamase Producer (ESBL).  In bloodstream infections from ESBL organisms, carbapenems are preferred over piperacillin/tazobactam. They are shown to have a lower risk of mortality.    Report Status 07/08/2020 FINAL  Final   Organism ID, Bacteria ESCHERICHIA COLI  Final      Susceptibility   Escherichia coli - MIC*    AMPICILLIN >=32 RESISTANT Resistant     CEFAZOLIN >=64 RESISTANT Resistant     CEFEPIME 8 INTERMEDIATE  Intermediate     CEFTAZIDIME RESISTANT Resistant     CEFTRIAXONE >=64 RESISTANT Resistant     CIPROFLOXACIN  >=4 RESISTANT Resistant     GENTAMICIN <=1 SENSITIVE Sensitive     IMIPENEM <=0.25 SENSITIVE Sensitive     TRIMETH/SULFA <=20 SENSITIVE Sensitive     AMPICILLIN/SULBACTAM >=32 RESISTANT Resistant     PIP/TAZO <=4 SENSITIVE Sensitive     * ABUNDANT ESCHERICHIA COLI  Urine Culture     Status: None   Collection Time: 07/07/20 10:43 AM   Specimen: Urine, Random  Result Value Ref Range Status   Specimen Description   Final    URINE, RANDOM Performed at [facility], [address] [place], [place] [address]    Special Requests   Final    NONE Performed at [facility], [address], [place], [place] [address]    Culture   Final    NO GROWTH Performed at [facility], [place] [address], [place], [place] [address]    Report Status 07/08/2020 FINAL  Final  Gastrointestinal Panel by PCR , Stool     Status: None   Collection Time: 07/07/20 12:05 PM   Specimen: Stool  Result Value Ref Range Status   Campylobacter species NOT DETECTED NOT DETECTED Final   Plesimonas shigelloides NOT DETECTED NOT DETECTED Final   Salmonella species NOT DETECTED NOT DETECTED Final   Yersinia enterocolitica NOT DETECTED NOT DETECTED Final   Vibrio species NOT DETECTED NOT DETECTED Final   Vibrio cholerae NOT DETECTED NOT DETECTED Final   Enteroaggregative E coli (EAEC) NOT DETECTED NOT DETECTED Final   Enteropathogenic E coli (EPEC) NOT DETECTED NOT DETECTED Final   Enterotoxigenic E coli (ETEC) NOT DETECTED NOT DETECTED Final   Shiga like toxin producing E coli (STEC) NOT DETECTED NOT DETECTED Final   Shigella/Enteroinvasive E coli (EIEC) NOT DETECTED NOT DETECTED Final   Cryptosporidium NOT DETECTED NOT DETECTED Final   Cyclospora cayetanensis NOT DETECTED NOT DETECTED Final   Entamoeba histolytica NOT DETECTED NOT DETECTED Final   Giardia lamblia NOT DETECTED NOT DETECTED Final   Adenovirus F40/41 NOT DETECTED NOT DETECTED Final   Astrovirus NOT DETECTED NOT DETECTED Final    Norovirus GI/GII NOT DETECTED NOT DETECTED Final   Rotavirus A NOT DETECTED NOT DETECTED Final   Sapovirus (I, II, IV, and V) NOT DETECTED NOT DETECTED Final    Comment: Performed at [facility], [address] [place], [place] [address]  C Difficile Quick Screen w PCR reflex     Status: None   Collection Time: 07/07/20 12:05 PM  Result Value Ref Range Status   C Diff antigen NEGATIVE NEGATIVE Final   C Diff toxin NEGATIVE NEGATIVE Final   C Diff interpretation No C. difficile detected.  Final    Comment: Performed at [facility], [address] [place], [place] [address]  CULTURE, BLOOD (ROUTINE X 2) w Reflex to ID Panel     Status: None   Collection Time: 07/07/20  1:38 PM   Specimen: BLOOD  Result Value Ref Range Status   Specimen Description BLOOD BLOOD LEFT HAND  Final   Special Requests   Final    BOTTLES DRAWN AEROBIC AND ANAEROBIC Blood Culture adequate volume   Culture   Final    NO GROWTH 5 DAYS Performed at [facility], [address], [place], [place] [address]    Report Status 07/12/2020 FINAL  Final  CULTURE, BLOOD (ROUTINE X 2) w Reflex  to ID Panel     Status: None   Collection Time: 07/07/20  1:38 PM   Specimen: BLOOD  Result Value Ref Range Status   Specimen Description BLOOD BLOOD RIGHT HAND  Final   Special Requests   Final    BOTTLES DRAWN AEROBIC AND ANAEROBIC Blood Culture adequate volume   Culture   Final    NO GROWTH 5 DAYS Performed at [facility], [address] [place], [place] [address]    Report Status 07/12/2020 FINAL  Final  SARS CORONAVIRUS 2 (TAT 6-24 HRS) Nasopharyngeal Nasopharyngeal Swab     Status: None   Collection Time: 07/07/20  2:10 PM   Specimen: Nasopharyngeal Swab  Result Value Ref Range Status   SARS Coronavirus 2 NEGATIVE NEGATIVE Final    Comment: (NOTE) SARS-CoV-2 target nucleic acids are NOT DETECTED.  The SARS-CoV-2 RNA is generally detectable in upper and lower respiratory  specimens during the acute phase of infection. Negative results do not preclude SARS-CoV-2 infection, do not rule out co-infections with other pathogens, and should not be used as the sole basis for treatment or other patient management decisions. Negative results must be combined with clinical observations, patient history, and epidemiological information. The expected result is Negative.  Fact Sheet for Patients: SugarRoll.be  Fact Sheet for Healthcare Providers: https://www.woods-mathews.com/  This test is not yet approved or cleared by the [place] FDA and  has been authorized for detection and/or diagnosis of SARS-CoV-2 by FDA under an Emergency Use Authorization (EUA). This EUA will remain  in effect (meaning this test can be used) for the duration of the COVID-19 declaration under Se ction 564(b)(1) of the Act, 21 U.S.C. section 360bbb-3(b)(1), unless the authorization is terminated or revoked sooner.  Performed at [facility], [place] [address] [place], [place] [address]     Coagulation Studies: No results for input(s): LABPROT, INR in the last 72 hours.  Urinalysis: No results for input(s): COLORURINE, LABSPEC, PHURINE, GLUCOSEU, HGBUR, BILIRUBINUR, KETONESUR, PROTEINUR, UROBILINOGEN, NITRITE, LEUKOCYTESUR in the last 72 hours.  Invalid input(s): APPERANCEUR    Imaging: DG Chest Port 1 View  Result Date: 07/12/2020 CLINICAL DATA:  65 year old male status post intubation. EXAM: PORTABLE CHEST 1 VIEW COMPARISON:  Chest x-ray 07/11/2020. FINDINGS: An endotracheal tube is in place with tip 4.5 cm above the carina. There is a right-sided internal jugular central venous catheter with tip terminating in the superior cavoatrial junction. A nasogastric tube is seen extending into the stomach, however, the tip of the nasogastric tube extends below the lower margin of the image. Lung volumes are low. No consolidative airspace  disease. No pleural effusions. Prominent bilateral nipple shadows are incidentally noted. No other definite suspicious appearing pulmonary nodules or masses are noted. No pneumothorax. No evidence of pulmonary edema. Heart size is normal. The patient is rotated to the right on today's exam, resulting in distortion of the mediastinal contours and reduced diagnostic sensitivity and specificity for mediastinal pathology. IMPRESSION: 1. Support apparatus, as above. 2. Low lung volumes without radiographic evidence of acute cardiopulmonary disease. Electronically Signed   By: Vinnie Langton M.D.   On: 07/12/2020 07:13   [place] EKG SITE RITE  Result Date: 07/11/2020 If [facility] image not attached, placement could not be confirmed due to current cardiac rhythm.    Medications:   . sodium chloride Stopped (07/12/20 1602)  . sodium chloride    . anidulafungin Stopped (07/12/20 2046)  . anticoagulant sodium citrate    . dextrose  5 % and 0.45% NaCl 75 mL/hr at 07/13/20 0800  . HYDROmorphone 2 mg/hr (07/13/20 0800)  . insulin 8 mL/hr at 07/13/20 0800  . magnesium sulfate bolus IVPB    . meropenem (MERREM) IV Stopped (07/12/20 2316)  . norepinephrine (LEVOPHED) Adult infusion Stopped (07/12/20 1455)  . propofol (DIPRIVAN) infusion 10 mcg/kg/min (07/13/20 0800)  . TPN ADULT (ION) 65 mL/hr at 07/13/20 0800   . aspirin EC  81 mg Oral Q0600  . chlorhexidine gluconate (MEDLINE KIT)  15 mL Mouth Rinse BID  . Chlorhexidine Gluconate Cloth  6 each Topical Once  . collagenase  1 application Topical Daily  . docusate  100 mg Per Tube Daily  . enoxaparin (LOVENOX) injection  30 mg Subcutaneous Q24H  . hydrocortisone sod succinate (SOLU-CORTEF) inj  50 mg Intravenous Q12H  . mouth rinse  15 mL Mouth Rinse 10 times per day  . pantoprazole (PROTONIX) IV  40 mg Intravenous Q12H  . vancomycin  125 mg Oral QID   sodium chloride, acetaminophen **OR** acetaminophen, alum & mag hydroxide-simeth, anticoagulant sodium  citrate, HYDROmorphone (DILAUDID) injection, magnesium sulfate bolus IVPB, phenol, polyvinyl alcohol, senna-docusate, sorbitol, white petrolatum  Assessment/ Plan:  Ivan Larsen is a 65 y.o. white male with peripheral vascular disease, coronary artery disease, nephrolithiasis, hyperlipidemia, diabetes mellitus type II, who was admitted to [facility] on 07/05/2020 for Atherosclerosis of artery of extremity with ulceration ([place]) [I70.299, M25.003] S/P aorto-bifemoral bypass surgery [B04.888] Underwent bilateral femoral artery bypass graft by Dr. Lucky Cowboy and Dr. Delana Meyer. on 07/06/2020   #. Acute renal failure : with baseline creatinine of 0.9 with normal GFR on 06/25/20.  History of glycosuria and underlying diabetic kidney disease.  AKI likely ATN due to hypotension  02/27 0701 - 02/28 0700 In: 4424 [I.V.:3554.1; Blood:340; NG/GT:155; IV Piggyback:329.9] Out: 9169 [Urine:510; Emesis/NG output:850; Stool:45]  Lab Results  Component Value Date   CREATININE 2.99 (H) 07/12/2020   CREATININE 2.89 (H) 07/12/2020   CREATININE 2.90 (H) 07/12/2020    Michael down to 510 cc over the preceding 24 hours.  Creatinine at last check was 2.9.  No immediate need to start dialysis but may need to consider this if urine output drops further.   #Acute respiratory failure Weaning from the ventilator as per pulmonary/critical care.  # hypokalemia Continue to monitor serum potassium periodically.  # Hypernatremia Sodium currently 150.  DC D5 half-normal saline and switch back to D5W at 100 cc/h.      LOS: 12 Ivan Larsen 2/28/20229:28 AM

## 2020-07-13 NOTE — Progress Notes (Signed)
[facility]  Daily Progress Note   Subjective  -   Patient remains intubated and sedated. Did not tolerate tube feeds this weekend.  Off pressors. Being treated for resistant E. Coli pneumonia For MRI of brain today  Objective Vitals:   07/13/20 1015 07/13/20 1030 07/13/20 1045 07/13/20 1100  BP: (!) 122/50 (!) 122/53 (!) 153/56 (!) 155/56  Pulse: 83 82 82 87  Resp: (!) 26 (!) 26 (!) 26 (!) 26  Temp: 98.96 F (37.2 C) 99.14 F (37.3 C) 99.14 F (37.3 C) 99.14 F (37.3 C)  TempSrc:      SpO2: 99% 98% 99% 96%  Weight:      Height:        Intake/Output Summary (Last 24 hours) at 07/13/2020 1127 Last data filed at 07/13/2020 1100 Gross per 24 hour  Intake 4401.5 ml  Output 1270 ml  Net 3131.5 ml    PULM  Coarse B CV  RRR VASC  Feet warm, doppler signals. Incisions C/D/I  Laboratory CBC    Component Value Date/Time   WBC 41.2 (H) 07/13/2020 0504   HGB 7.9 (L) 07/13/2020 0504   HGB 14.7 06/04/2020 1441   HCT 25.0 (L) 07/13/2020 0504   HCT 45.6 06/04/2020 1441   PLT 181 07/13/2020 0504   PLT 320 06/04/2020 1441    BMET    Component Value Date/Time   NA 148 (H) 07/13/2020 0504   NA 140 06/02/2020 1347   NA 141 07/03/2012 0511   K 4.6 07/13/2020 0504   K 4.0 07/03/2012 0511   CL 113 (H) 07/13/2020 0504   CL 108 (H) 07/03/2012 0511   CO2 24 07/13/2020 0504   CO2 24 07/03/2012 0511   GLUCOSE 183 (H) 07/13/2020 0504   GLUCOSE 185 (H) 07/03/2012 0511   BUN 136 (H) 07/13/2020 0504   BUN 24 06/02/2020 1347   BUN 14 07/03/2012 0511   CREATININE 3.40 (H) 07/13/2020 0504   CREATININE 0.86 07/03/2012 0511   CALCIUM 6.8 (L) 07/13/2020 0504   CALCIUM 8.1 (L) 07/03/2012 0511   GFRNONAA 19 (L) 07/13/2020 0504   GFRNONAA >60 07/03/2012 0511   GFRAA 76 06/02/2020 1347   GFRAA >60 07/03/2012 0511    Assessment/Planning: POD #12 s/p aortabifemoral bypass, renal artery endarterectomy, aortic and femoral endarterectomies   Remains intubated  and sedated  Has not had reliable neurologic exam and fights the vent when off sedation  For MRI of the brain today  Did not tolerated enteral feeds this weekend, Gastric tube output lower today. May reassess for tube feeds tomorrow  Feet are warm, doppler signals  Incisions C/D/I  Renal function remains poor but stable  Overall, has multiple ongoing issues and high risk of poor outcome. Family aware. Aggressive care for now. MRI results may help guide further care.    Festus Barren  07/13/2020, 11:27 AM

## 2020-07-13 NOTE — Progress Notes (Signed)
GOALS OF CARE DISCUSSION  The Clinical status was relayed to family in detail. Daughter at bedside  Updated and notified of patients medical condition.  Patient remains unresponsive and will not open eyes to command.    Patient is having a weak cough and struggling to remove secretions.   Patient with increased WOB and using accessory muscles to breathe Explained to family course of therapy and the modalities     Patient with Progressive multiorgan failure with a very high probablity of a very minimal chance of meaningful recovery despite all aggressive and optimal medical therapy.   Process associated with Suffering.  Family understands the situation.  They have consented and agreed to DNR status Plan to Obtain MRI brain today Assess for enteral feeds tomorrow  Family are satisfied with Plan of action and management. All questions answered  Additional CC time 32 mins   Tristen Pennino Santiago Glad, M.D.  [facility]  Medical Director [facility] Medical Director [facility] Cardio-Pulmonary Department

## 2020-07-13 NOTE — Progress Notes (Signed)
Patient transported to MRI via transport vent with RN.  Patient tolerated well.

## 2020-07-13 NOTE — Progress Notes (Signed)
PHARMACY - TOTAL PARENTERAL NUTRITION CONSULT NOTE   Indication: Prolonged ileus  Patient Measurements: Height: 5\' 5"  (165.1 cm) Weight: 77.2 kg (170 lb 3.1 oz) IBW/kg (Calculated) : 61.5 TPN AdjBW (KG): 63.5 Body mass index is 28.32 kg/m.  Assessment: 65 year old male with PMHx of CAD, PAD, DM, HLD, MI s/p aortobifemoral bypass, bilateral renal endarterectomies, aortic endarterectomy, and bilateral femoral endarterectomies. Tube feeds were unable to be initiated yesterday. Patient was 6 days without nutrition prior to TPN initiation.   Glucose / Insulin: 149-177  insulin drip Solucortef 50 mg IV q 8 hours > q12H D5 at 100 ml/hr  Electrolytes: hypokalemia, hypernatremia  Renal: iHD Intake / Output; MIVF: 5080 / 2255 (+ 18 L) GI Imaging: 2/21: Suspect a degree of bowel ileus. No evident free air on semi-erect image GI Surgeries / Procedures: no recent  Central access: July 08, 2020 TPN start date: 07/07/20  Nutritional Goals (per RD recommendation on 07/07/20): kCal: 1800 - 2000/day, Protein: 95 - 110 grams/day, Fluid: 2L/day  Current Nutrition:  NPO  Plan:   Continue TPN at reduced rate 65 mL/hr  due to the addition of D5 (1440 mL total). Sodium steadily decreasing, expect patient can come off D5 in next 24 hrs.  Propofol currently off. Will add lipids tonight.  Nutritional Components  Amino Acids (Clinisol 15%) 54 g/L: 84.25 grams total  Dextrose: 19%: 296.4 grams total  Lipids: ~ 50 g MWF  KCal: 1344.72 total/day  Electrolytes in TPN: Na 30 mEq/L, K 30 mEq/L, Ca 5 mEq/L, Mg 5 mEq/L, and Phos 5 mmol/L. Cl:Ac 1:1  Will add back minimal sodium and monitor trend. Do not expect TPN contributing significantly to hypernatremia.  Creatinine continues to increase. Expect electrolytes to rise.  Add standard MVI and trace elements to TPN  Continues on insulin infusion.  Monitor TPN labs on Mon/Thurs, daily until stable   07/09/20, PharmD Clinical  Pharmacist 07/13/2020,11:36 AM

## 2020-07-13 NOTE — Progress Notes (Signed)
Patient continues to be intubated and on the vent.  Pt is unresponsive and has a dilaudid gtt infusing at 2 mg/hr but continues to breath over the vent. One time Versed 4mg  IV given for vent synchrony with little improvement noted. Will continue to monitor.

## 2020-07-13 NOTE — Progress Notes (Addendum)
Inpatient Diabetes Program Recommendations  AACE/ADA: New Consensus Statement on Inpatient Glycemic Control   Target Ranges:  Prepandial:   less than 140 mg/dL      Peak postprandial:   less than 180 mg/dL (1-2 hours)      Critically ill patients:  140 - 180 mg/dL   Results for Ivan Larsen, Ivan Larsen (MRN [id number]) as of 07/13/2020 09:20  Ref. Range 07/13/2020 00:17 07/13/2020 02:10 07/13/2020 04:09 07/13/2020 06:13 07/13/2020 08:02  Glucose-Capillary Latest Ref Range: 70 - 99 mg/dL 993 (H) 570 (H) 177 (H) 172 (H) 174 (H)   Review of Glycemic Control  Diabetes history: DM2 Outpatient Diabetes medications: Jardiance 25 mg QAM, Metformin 1000 mg BID, Januvia 25 mg daily Current orders for Inpatient glycemic control: IV insulin; Solucortef 50 mg Q12H, TPN @ 65 ml/hr  Inpatient Diabetes Program Recommendations:    Insulin: If appropriate and provider interested in transitioning from IV to SQ insulin, recommend using ICU Glycemic Control Phase 3 order set and ordering Levemir 25 units Q24H, CBGs Q4H, Novolog 3-9 units Q4H and adding 60 units of regular insulin to TPN.  Thanks, Orlando Penner, RN, MSN, CDE Diabetes Coordinator Inpatient Diabetes Program [phone] (Team Pager from 8am to 5pm)

## 2020-07-13 NOTE — Progress Notes (Addendum)
Propofol started this shift due to patient continues to be pulling with each respiration and dyssynchronous with the ventilator. End tidal CO2 improved since Propofol started.

## 2020-07-14 DIAGNOSIS — Z978 Presence of other specified devices: Secondary | ICD-10-CM | POA: Diagnosis not present

## 2020-07-14 DIAGNOSIS — G939 Disorder of brain, unspecified: Secondary | ICD-10-CM

## 2020-07-14 DIAGNOSIS — I468 Cardiac arrest due to other underlying condition: Secondary | ICD-10-CM | POA: Diagnosis not present

## 2020-07-14 DIAGNOSIS — N170 Acute kidney failure with tubular necrosis: Secondary | ICD-10-CM

## 2020-07-14 DIAGNOSIS — R945 Abnormal results of liver function studies: Secondary | ICD-10-CM | POA: Diagnosis not present

## 2020-07-14 DIAGNOSIS — J9601 Acute respiratory failure with hypoxia: Secondary | ICD-10-CM | POA: Diagnosis not present

## 2020-07-14 DIAGNOSIS — Z7189 Other specified counseling: Secondary | ICD-10-CM | POA: Diagnosis not present

## 2020-07-14 LAB — COMPREHENSIVE METABOLIC PANEL
ALT: 18 U/L (ref 0–44)
AST: 68 U/L — ABNORMAL HIGH (ref 15–41)
Albumin: 1.4 g/dL — ABNORMAL LOW (ref 3.5–5.0)
Alkaline Phosphatase: 85 U/L (ref 38–126)
Anion gap: 13 (ref 5–15)
BUN: 159 mg/dL — ABNORMAL HIGH (ref 8–23)
CO2: 21 mmol/L — ABNORMAL LOW (ref 22–32)
Calcium: 6.7 mg/dL — ABNORMAL LOW (ref 8.9–10.3)
Chloride: 110 mmol/L (ref 98–111)
Creatinine, Ser: 3.96 mg/dL — ABNORMAL HIGH (ref 0.61–1.24)
GFR, Estimated: 16 mL/min — ABNORMAL LOW (ref >60)
Glucose, Bld: 178 mg/dL — ABNORMAL HIGH (ref 70–99)
Potassium: 3.8 mmol/L (ref 3.5–5.1)
Sodium: 144 mmol/L (ref 135–145)
Total Bilirubin: 1.5 mg/dL — ABNORMAL HIGH (ref 0.3–1.2)
Total Protein: 4.4 g/dL — ABNORMAL LOW (ref 6.5–8.1)

## 2020-07-14 LAB — GLUCOSE, CAPILLARY
Glucose-Capillary: 156 mg/dL — ABNORMAL HIGH (ref 70–99)
Glucose-Capillary: 156 mg/dL — ABNORMAL HIGH (ref 70–99)
Glucose-Capillary: 157 mg/dL — ABNORMAL HIGH (ref 70–99)
Glucose-Capillary: 159 mg/dL — ABNORMAL HIGH (ref 70–99)
Glucose-Capillary: 159 mg/dL — ABNORMAL HIGH (ref 70–99)
Glucose-Capillary: 160 mg/dL — ABNORMAL HIGH (ref 70–99)
Glucose-Capillary: 164 mg/dL — ABNORMAL HIGH (ref 70–99)
Glucose-Capillary: 169 mg/dL — ABNORMAL HIGH (ref 70–99)
Glucose-Capillary: 170 mg/dL — ABNORMAL HIGH (ref 70–99)
Glucose-Capillary: 177 mg/dL — ABNORMAL HIGH (ref 70–99)
Glucose-Capillary: 181 mg/dL — ABNORMAL HIGH (ref 70–99)

## 2020-07-14 LAB — CBC
HCT: 21.7 % — ABNORMAL LOW (ref 39.0–52.0)
Hemoglobin: 7.4 g/dL — ABNORMAL LOW (ref 13.0–17.0)
MCH: 29 pg (ref 26.0–34.0)
MCHC: 34.1 g/dL (ref 30.0–36.0)
MCV: 85.1 fL (ref 80.0–100.0)
Platelets: 215 10*3/uL (ref 150–400)
RBC: 2.55 MIL/uL — ABNORMAL LOW (ref 4.22–5.81)
RDW: 21.5 % — ABNORMAL HIGH (ref 11.5–15.5)
WBC: 35.9 10*3/uL — ABNORMAL HIGH (ref 4.0–10.5)
nRBC: 0.3 % — ABNORMAL HIGH (ref 0.0–0.2)

## 2020-07-14 LAB — PHOSPHORUS: Phosphorus: 4 mg/dL (ref 2.5–4.6)

## 2020-07-14 LAB — MAGNESIUM: Magnesium: 2.1 mg/dL (ref 1.7–2.4)

## 2020-07-14 MED ORDER — DOCUSATE SODIUM 50 MG/5ML PO LIQD
100.0000 mg | Freq: Two times a day (BID) | ORAL | Status: DC
  Administered 2020-07-14: 100 mg
  Filled 2020-07-14: qty 10

## 2020-07-14 MED ORDER — MORPHINE BOLUS VIA INFUSION
5.0000 mg | INTRAVENOUS | Status: DC | PRN
  Administered 2020-07-14 – 2020-07-15 (×15): 5 mg via INTRAVENOUS
  Filled 2020-07-14: qty 5

## 2020-07-14 MED ORDER — ACETAMINOPHEN 650 MG RE SUPP: 650.0000 mg | Freq: Four times a day (QID) | RECTAL | Status: DC | PRN

## 2020-07-14 MED ORDER — FENTANYL 2500MCG IN NS 250ML (10MCG/ML) PREMIX INFUSION
INTRAVENOUS | Status: AC
  Administered 2020-07-14: 25 ug/h via INTRAVENOUS
  Filled 2020-07-14: qty 250

## 2020-07-14 MED ORDER — ACETAMINOPHEN 325 MG PO TABS: 650.0000 mg | ORAL_TABLET | Freq: Four times a day (QID) | ORAL | Status: DC | PRN

## 2020-07-14 MED ORDER — GLYCOPYRROLATE 0.2 MG/ML IJ SOLN
0.2000 mg | INTRAMUSCULAR | Status: DC | PRN
  Filled 2020-07-14: qty 1

## 2020-07-14 MED ORDER — FENTANYL 2500MCG IN NS 250ML (10MCG/ML) PREMIX INFUSION
0.0000 ug/h | INTRAVENOUS | Status: DC
  Administered 2020-07-15: 100 ug/h via INTRAVENOUS
  Filled 2020-07-14: qty 250

## 2020-07-14 MED ORDER — LORAZEPAM 2 MG/ML IJ SOLN
INTRAMUSCULAR | Status: AC
  Administered 2020-07-14: 4 mg via INTRAVENOUS
  Filled 2020-07-14: qty 2

## 2020-07-14 MED ORDER — TRACE MINERALS CU-MN-SE-ZN 300-55-60-3000 MCG/ML IV SOLN
INTRAVENOUS | Status: DC
  Filled 2020-07-14: qty 691.2

## 2020-07-14 MED ORDER — INSULIN DETEMIR 100 UNIT/ML ~~LOC~~ SOLN
25.0000 [IU] | Freq: Every day | SUBCUTANEOUS | Status: DC
  Administered 2020-07-14: 25 [IU] via SUBCUTANEOUS
  Filled 2020-07-14: qty 0.25

## 2020-07-14 MED ORDER — GLYCOPYRROLATE 0.2 MG/ML IJ SOLN
0.2000 mg | INTRAMUSCULAR | Status: DC | PRN
  Administered 2020-07-14 – 2020-07-15 (×4): 0.2 mg via INTRAVENOUS
  Filled 2020-07-14 (×7): qty 1

## 2020-07-14 MED ORDER — TRACE MINERALS CU-MN-SE-ZN 300-55-60-3000 MCG/ML IV SOLN: INTRAVENOUS | Status: DC

## 2020-07-14 MED ORDER — MIDAZOLAM HCL 2 MG/2ML IJ SOLN
2.0000 mg | INTRAMUSCULAR | Status: DC | PRN
  Administered 2020-07-14: 4 mg via INTRAVENOUS
  Filled 2020-07-14: qty 4

## 2020-07-14 MED ORDER — FENTANYL BOLUS VIA INFUSION
25.0000 ug | INTRAVENOUS | Status: DC | PRN
  Administered 2020-07-14: 25 ug via INTRAVENOUS
  Filled 2020-07-14: qty 25

## 2020-07-14 MED ORDER — INSULIN ASPART 100 UNIT/ML ~~LOC~~ SOLN
3.0000 [IU] | SUBCUTANEOUS | Status: DC
  Filled 2020-07-14: qty 1

## 2020-07-14 MED ORDER — DEXTROSE 5 % IV SOLN: INTRAVENOUS | Status: DC

## 2020-07-14 MED ORDER — DIPHENHYDRAMINE HCL 50 MG/ML IJ SOLN: 25.0000 mg | INTRAMUSCULAR | Status: DC | PRN

## 2020-07-14 MED ORDER — MORPHINE SULFATE (PF) 2 MG/ML IV SOLN
2.0000 mg | INTRAVENOUS | Status: DC | PRN
  Administered 2020-07-14: 2 mg via INTRAVENOUS
  Filled 2020-07-14: qty 1

## 2020-07-14 MED ORDER — POLYVINYL ALCOHOL 1.4 % OP SOLN
1.0000 [drp] | Freq: Four times a day (QID) | OPHTHALMIC | Status: DC | PRN
  Filled 2020-07-14: qty 15

## 2020-07-14 MED ORDER — LORAZEPAM 2 MG/ML IJ SOLN
2.0000 mg | INTRAMUSCULAR | Status: DC | PRN
  Administered 2020-07-14 – 2020-07-15 (×2): 4 mg via INTRAVENOUS
  Filled 2020-07-14 (×2): qty 1
  Filled 2020-07-14: qty 2

## 2020-07-14 MED ORDER — POLYETHYLENE GLYCOL 3350 17 G PO PACK: 17.0000 g | PACK | Freq: Every day | ORAL | Status: DC

## 2020-07-14 MED ORDER — MORPHINE 100MG IN NS 100ML (1MG/ML) PREMIX INFUSION
0.0000 mg/h | INTRAVENOUS | Status: DC
  Administered 2020-07-14: 5 mg/h via INTRAVENOUS
  Administered 2020-07-14 – 2020-07-15 (×7): 20 mg/h via INTRAVENOUS
  Filled 2020-07-14 (×9): qty 100

## 2020-07-14 MED ORDER — VITAL 1.5 CAL PO LIQD
1000.0000 mL | ORAL | Status: DC
  Administered 2020-07-14: 1000 mL

## 2020-07-14 MED ORDER — FENTANYL 2500MCG IN NS 250ML (10MCG/ML) PREMIX INFUSION
0.0000 ug/h | INTRAVENOUS | Status: DC
  Administered 2020-07-14: 25 ug/h via INTRAVENOUS

## 2020-07-14 MED ORDER — HYDROMORPHONE HCL 1 MG/ML IJ SOLN
1.0000 mg | INTRAMUSCULAR | Status: DC | PRN
  Administered 2020-07-15: 2 mg via INTRAVENOUS
  Filled 2020-07-14: qty 2

## 2020-07-14 MED ORDER — FENTANYL CITRATE (PF) 100 MCG/2ML IJ SOLN
25.0000 ug | Freq: Once | INTRAMUSCULAR | Status: DC
Start: 2020-07-14 — End: 2020-07-16

## 2020-07-14 MED ORDER — GLYCOPYRROLATE 1 MG PO TABS
1.0000 mg | ORAL_TABLET | ORAL | Status: DC | PRN
  Filled 2020-07-14: qty 1

## 2020-07-14 NOTE — TOC Progression Note (Signed)
Transition of Care [facility]) - Progression Note    Patient Details  Name: Ivan Larsen MRN: [id number] Date of Birth: 01-Dec-1955  [facility] CM/SW Contact  [name] Phone Number: [phone] 07/14/2020, 11:47 AM  Clinical Narrative:     CSW contacted patient's [name] (Daughter) [phone] for update on patient disposition.  CSW spoke to Ms. Obie Dredge about possible trach procedure and LTACH placement.  Ms. Obie Dredge stated CSW needed to speak with the RN because she wasn't sure if the patient was going to make it out of the hospital.  CSW stated that the last conversation I had with the Attending was about possible trach placement.  Ms. Obie Dredge stated she was not certain what was going to happen.  CSW stated I would be available once the decisions were made for disposition.  Ms. Obie Dredge verbalized understanding.  Expected Discharge Plan: Skilled Nursing Facility Barriers to Discharge: SNF Pending bed offer,Continued Medical Work up  Expected Discharge Plan and Services Expected Discharge Plan: Skilled Nursing Facility In-house Referral: Clinical Social Work     Living arrangements for the past 2 months: Single Family Home                                       Social Determinants of Health (SDOH) Interventions    Readmission Risk Interventions No flowsheet data found.

## 2020-07-14 NOTE — Consult Note (Signed)
NEUROLOGY CONSULTATION NOTE   Date of service: July 14, 2020 Patient Name: Ivan Larsen MRN:  [id number] DOB:  [birth year] Reason for consult: "HIE on MRI Brain" _ _ _   _ __   _ __ _ _  __ __   _ __   __ _  History of Present Illness  Ivan Larsen is a 65 y.o. male with PMH significant for DM2, HLD, MI, PAD initially admitted on 2/16 for aortobifemoral bypass, bilateral renal endarterectomies, bilateral femoral endarterectomies and aortic endarterectomy. Was in circulatory shock post op and admitted to ICU. Admission complicated by worsening kidney function, requiring pressors, with subsequent improvement in kidney functions and weaned off pressors and extubated. He had hypoxia and PEA arrest on 07/08/20 with ROSC achieved in 9 mins and he was reintubated. Noted to have leukocytosis and concern for bowel ischemis vs abscess and on meropenem, erythromycin, Vancomycin and anidulafungin. He had MRI Brain without contrast which demonstrated diffuse cerebral prominence with mild associated restricted diffusion concerning for sequela of hypoxic-ischemic injury.  I am seeing him for concern for anoxic injury.   ROS   Unable to obtain given he is intubated and poorly responsive.  Past History   Past Medical History:  Diagnosis Date  . CAD (coronary artery disease)   . Diabetes mellitus without complication (HCC)   . Elevated liver enzymes   . History of kidney stones   . Hyperlipidemia   . MI (myocardial infarction) (HCC) 07/01/12  . PAD (peripheral artery disease) (HCC)    Past Surgical History:  Procedure Laterality Date  . AORTA - BILATERAL FEMORAL ARTERY BYPASS GRAFT N/A 2020/07/22   Procedure: AORTA BIFEMORAL BYPASS GRAFT (VERSUS BILATERAL FEMORAL ENDARTERECTOMIES);  Surgeon: Annice Needy, MD;  Location: [facility] ORS;  Service: Vascular;  Laterality: N/A;  . CORONARY STENT PLACEMENT    . EYE SURGERY    . LOWER EXTREMITY ANGIOGRAPHY Left 06/25/2020   Procedure: LOWER EXTREMITY  ANGIOGRAPHY;  Surgeon: Annice Needy, MD;  Location: [facility] INVASIVE CV LAB;  Service: Cardiovascular;  Laterality: Left;   Family History  Problem Relation Age of Onset  . Diabetes Mother   . Heart attack Father    Social History   Socioeconomic History  . Marital status: Married    Spouse name: Not on file  . Number of children: Not on file  . Years of education: Not on file  . Highest education level: Not on file  Occupational History  . Not on file  Tobacco Use  . Smoking status: Former Smoker    Types: Cigarettes    Quit date: 01/15/2004    Years since quitting: 16.5  . Smokeless tobacco: Never Used  Vaping Use  . Vaping Use: Never used  Substance and Sexual Activity  . Alcohol use: Yes  . Drug use: No  . Sexual activity: Not on file  Other Topics Concern  . Not on file  Social History Narrative  . Not on file   Social Determinants of Health   Financial Resource Strain: Not on file  Food Insecurity: Not on file  Transportation Needs: Not on file  Physical Activity: Not on file  Stress: Not on file  Social Connections: Not on file   Allergies  Allergen Reactions  . Iodinated Diagnostic Agents Nausea Only    Other reaction(s): Vomiting  . Iodine   . Shellfish Allergy Nausea And Vomiting and Other (See Comments)    Sweating  Sweating    . Shellfish-Derived Products Nausea  And Vomiting and Other (See Comments)    Other reaction(s): Nausea And Vomiting, Other (See Comments) Sweating  Sweating     Medications   Medications Prior to Admission  Medication Sig Dispense Refill Last Dose  . amLODipine (NORVASC) 10 MG tablet Take 1 tablet (10 mg total) by mouth daily. 90 tablet 1 06/21/2020 at Unknown time  . atorvastatin (LIPITOR) 40 MG tablet Take 1 tablet (40 mg total) by mouth daily. 90 tablet 4 06/30/2020 at Unknown time  . benazepril (LOTENSIN) 40 MG tablet Take 1 tablet (40 mg total) by mouth daily. 90 tablet 1 06/30/2020 at Unknown time  . collagenase  (SANTYL) ointment Apply 1 application topically daily. 15 g 0 06/30/2020 at Unknown time  . empagliflozin (JARDIANCE) 25 MG TABS tablet Take 1 tablet (25 mg total) by mouth daily before breakfast. 90 tablet 1 07/10/2020 at Unknown time  . gabapentin (NEURONTIN) 300 MG capsule Take 1 capsule (300 mg total) by mouth 3 (three) times daily. 270 capsule 4 07/13/2020 at Unknown time  . hydrochlorothiazide (HYDRODIURIL) 25 MG tablet Take 1 tablet (25 mg total) by mouth daily. 90 tablet 1 06/17/2020 at Unknown time  . HYDROcodone-acetaminophen (NORCO) 5-325 MG tablet Take 1 tablet by mouth every 6 (six) hours as needed for moderate pain. 30 tablet 0 06/30/2020 at Unknown time  . meclizine (ANTIVERT) 25 MG tablet Take 1 tablet (25 mg total) by mouth 3 (three) times daily as needed for dizziness or nausea. 30 tablet 1 06/30/2020 at Unknown time  . metoprolol tartrate (LOPRESSOR) 50 MG tablet Take 1 tablet (50 mg total) by mouth 2 (two) times daily. 180 tablet 1 06/20/2020 at Unknown time  . mupirocin ointment (BACTROBAN) 2 % Apply 1 application topically 2 (two) times daily. 22 g 1 06/30/2020 at Unknown time  . Omega-3 Fatty Acids (FISH OIL OMEGA-3) 1000 MG CAPS Take by mouth 2 (two) times daily.   06/30/2020 at Unknown time  . ondansetron (ZOFRAN-ODT) 4 MG disintegrating tablet 1 TABLET ORALLY DISSOLVED EVERY 8 HOURS IF NEEDED FOR NAUSEA AND VOMITING (Patient taking differently: Take 4 mg by mouth every 8 (eight) hours as needed for nausea or vomiting.) 21 tablet 0 Past Month at Unknown time  . sitaGLIPtin (JANUVIA) 25 MG tablet Take 1 tablet (25 mg total) by mouth daily. 90 tablet 1 06/30/2020 at Unknown time  . tamsulosin (FLOMAX) 0.4 MG CAPS capsule Take 1 capsule (0.4 mg total) by mouth at bedtime. Take at nighttime to minimize dizziness/hypotension 90 capsule 1 06/30/2020 at Unknown time  . clopidogrel (PLAVIX) 75 MG tablet Take 1 tablet (75 mg total) by mouth daily. (Patient not taking: No sig reported) 90 tablet 1  Not Taking at Unknown time  . doxycycline (VIBRA-TABS) 100 MG tablet Take 1 tablet (100 mg total) by mouth 2 (two) times daily. (Patient not taking: No sig reported) 20 tablet 0 Not Taking at Unknown time  . metFORMIN (GLUCOPHAGE) 500 MG tablet Take 2 tablets (1,000 mg total) by mouth 2 (two) times daily with a meal. 360 tablet 1 06/29/2020     Vitals   Vitals:   07/14/20 0500 07/14/20 0600 07/14/20 0700 07/14/20 0800  BP: (!) 131/51 (!) 162/58 (!) 156/58 (!) 153/58  Pulse: 68 88 90 88  Resp: (!) 26 (!) 21 (!) 22 (!) 27  Temp: 99.14 F (37.3 C) 99.14 F (37.3 C) 99.14 F (37.3 C) 98.78 F (37.1 C)  TempSrc:    Esophageal  SpO2: 97% 98% 97% 95%  Weight:  Height:         Body mass index is 28.32 kg/m.  Physical Exam   General: intubated; in no acute distress. HENT: Normal oropharynx and mucosa. Normal external appearance of ears and nose. Neck: Supple, no pain or tenderness CV: No JVD. No peripheral edema. Pulmonary: Symmetric Chest rise. Normal respiratory effort. Abdomen: Soft to touch, non-tender. Ext: No cyanosis, edema, or deformity Skin: No rash. Normal palpation of skin.  Musculoskeletal: Normal digits and nails by inspection. No clubbing.  Neurologic Examination (Examined him twice, once about 10 mins after all sedation including propofol and Dilaudid gtt. Second exam was done after sedation was held for more than 2 hours)  Mental status/Cognition: Eyes partially open, blinks spontaneously. No response to voice, blinks to loud clap close to his face. Grimces to nares stimulation. No grimace to noxious stimuli in his arms or legs.  Pupils 72mm BL, round and reactive to light. No blink to threat. Corneals: Positive BL Cough: Weak, intermittent and was only able to obtain once and not on attempt to repeat. Gag: absent Dolls eyes: intact.(eyes tend to stay stationary when head moves)  Motor/Sensory: No spontaneous movement of any of his extremities. No movement  to nailbed pressure or pinching his inner thighs and inner arms.  Reflexes:  Right Left Comments  Pectoralis      Biceps (C5/6) 1 1   Brachioradialis (C5/6) 1 1    Triceps (C6/7) 1 1    Patellar (L3/4) 2 2    Achilles (S1) 1     Hoffman      Plantar up  Left foot wrapped in bandage.   Jaw jerk      Labs   CBC:  Recent Labs  Lab 07/08/20 0613 07/09/20 0425 07/11/20 0622 07/11/20 0933 07/13/20 0504 07/14/20 0549  WBC 20.5*   < > 57.5*   < > 41.2* 35.9*  NEUTROABS 15.6*  --  53.3*  --   --   --   HGB 7.9*   < > 9.1*   < > 7.9* 7.4*  HCT 24.3*   < > 29.1*   < > 25.0* 21.7*  MCV 85.3   < > 89.8   < > 89.3 85.1  PLT 127*   < > 187   < > 181 215   < > = values in this interval not displayed.    Basic Metabolic Panel:  Lab Results  Component Value Date   NA 144 07/14/2020   K 3.8 07/14/2020   CO2 21 (L) 07/14/2020   GLUCOSE 178 (H) 07/14/2020   BUN 159 (H) 07/14/2020   CREATININE 3.96 (H) 07/14/2020   CALCIUM 6.7 (L) 07/14/2020   GFRNONAA 16 (L) 07/14/2020   GFRAA 76 06/02/2020   Lipid Panel:  Lab Results  Component Value Date   LDLCALC 26 06/02/2020   HgbA1c:  Lab Results  Component Value Date   HGBA1C 5.7 (H) 07/03/2020   Urine Drug Screen: No results found for: LABOPIA, COCAINSCRNUR, LABBENZ, AMPHETMU, THCU, LABBARB  Alcohol Level No results found for: ETH  CT Head without contrast: CTH was negative for a large hypodensity concerning for a large territory infarct or hyperdensity concerning for an ICH  MRI Brain  Diffuse cerebral prominence with mild associated restricted diffusion concerning for sequela of hypoxic-ischemic injury.  rEEG: pending  Impression   Ivan Larsen is a 65 y.o. male with PMH significant for  DM2, HLD, MI, PAD initially admitted for Vascular surgery with complicated hospitalization. Had  an event of inhospital PEA arrest with ROSC in 9 mins. MRI Brain with diffuse cerebral prominence with mild associate restricted diffusion  concern for hypoxic ischemic injury. His neuro exam today is consistent with minimally conscious state. I did discuss with family that in some cases, this is a stage on the route to recovery but in other, it's permanent. Given paucity of data, somewhat difficult to predict how he will end up. Even if he does recover somewhat, this will be a protracted recovery over the course of few to several months given other comorbidites including kidney dysfunction and he will likely need a trach and a Peg tube for sustenance in the meantime.  I will also get a routine EEG to ensure that he does not have any seizures.  Recommendations  - rEEG ordered  ____________________________________________________________________  This patient is critically ill and at significant risk of neurological worsening, death and care requires constant monitoring of vital signs, hemodynamics,respiratory and cardiac monitoring, neurological assessment, discussion with family, other specialists and medical decision making of high complexity. I spent 50 minutes of neurocritical care time  in the care of  this patient. This was time spent independent of any time provided by nurse practitioner or PA.  Erick Blinks [facility] Pager Number [phone] 07/14/2020  1:07 PM   Thank you for the opportunity to take part in the care of this patient. If you have any further questions, please contact the neurology consultation attending.  Signed,  Erick Blinks [facility] Pager Number [phone] _ _ _   _ __   _ __ _ _  __ __   _ __   __ _

## 2020-07-14 NOTE — TOC Progression Note (Signed)
Transition of Care [facility]) - Progression Note    Patient Details  Name: Ivan Larsen MRN: [id number] Date of Birth: Oct 30, 1955  Transition of Care [facility]) CM/SW Contact  [name] Phone Number: [phone] 07/14/2020, 9:44 AM  Clinical Narrative:     Patient remains critically ill.  Patient's Ivan Larsen (Daughter) [phone] spoke with palliative NP about goals of care. Ms. Ivan Larsen stated she is open to possible trach, but will continue discussions with family.  Expected Discharge Plan: Skilled Nursing Facility Barriers to Discharge: SNF Pending bed offer,Continued Medical Work up  Expected Discharge Plan and Services Expected Discharge Plan: Skilled Nursing Facility In-house Referral: Clinical Social Work     Living arrangements for the past 2 months: Single Family Home                                       Social Determinants of Health (SDOH) Interventions    Readmission Risk Interventions No flowsheet data found.

## 2020-07-14 NOTE — Progress Notes (Signed)
Patient transitioned to comfort care this evening. Patient started on morphine infusion for pain management and tachypnea. Patient extubated to room air. He was tachypneic with increased work of breathing. Oral and oropharyngeal suctioned, moderate amount of thick tan secretions noted. PRN morphine, versed, and ativan doses given for RR > 20, accessory muscle use, and anxiety. Fentanyl gtt ordered for continued tachypnea and work of breathing. Family has remained at the bedside, emotional support given.

## 2020-07-14 NOTE — Progress Notes (Signed)
Daily Progress Note   Patient Name: Ivan Larsen       Date: 07/14/2020 DOB: [birth year]  Age: 65 y.o. MRN#: [id number] Attending Physician: Flora Lipps, MD Primary Care Physician: Venita Lick, NP Admit Date: 06/30/2020  Reason for Consultation/Follow-up: Establishing goals of care  Subjective: Patient is resting in bed on the ventilator. Daughter and patient's sister are at bedside. They discuss concerns over his QOL, and that they are concerned for an outcome where he improves some, but not as much as he would want to, and "is stuck". They are clear he would not want to live in a facility. Neurology in to evaluate patient with further work up needed,and I remained at bedside per family's request to assist with helping to discuss concerns and plans for care with the family. Discussed various scenarios includng best and worst case.   They have concerns about patient's wife being discharged from this facility today to [facility] and are worried about transitioning to comfort care and her not being there. Spoke with TOC about this.    Length of Stay: 13  Current Medications: Scheduled Meds:  . aspirin EC  81 mg Oral Q0600  . chlorhexidine gluconate (MEDLINE KIT)  15 mL Mouth Rinse BID  . Chlorhexidine Gluconate Cloth  6 each Topical Once  . collagenase  1 application Topical Daily  . docusate  100 mg Per Tube Daily  . feeding supplement (VITAL 1.5 CAL)  1,000 mL Per Tube Q24H  . hydrocortisone sod succinate (SOLU-CORTEF) inj  50 mg Intravenous Q12H  . insulin aspart  3-9 Units Subcutaneous Q4H  . insulin detemir  25 Units Subcutaneous Daily  . mouth rinse  15 mL Mouth Rinse 10 times per day  . pantoprazole (PROTONIX) IV  40 mg Intravenous Q12H    Continuous Infusions: .  sodium chloride    . anidulafungin Stopped (07/13/20 2023)  . anticoagulant sodium citrate    . erythromycin Stopped (07/14/20 0724)  . HYDROmorphone Stopped (07/14/20 1011)  . insulin 9.5 mL/hr at 07/14/20 1100  . magnesium sulfate bolus IVPB    . meropenem (MERREM) IV 200 mL/hr at 07/14/20 1100  . norepinephrine (LEVOPHED) Adult infusion Stopped (07/12/20 1455)  . propofol (DIPRIVAN) infusion 10 mcg/kg/min (07/14/20 0631)  . TPN ADULT (ION) 65 mL/hr at 07/14/20 0900  .  TPN ADULT (ION)      PRN Meds: sodium chloride, acetaminophen **OR** acetaminophen, alum & mag hydroxide-simeth, anticoagulant sodium citrate, HYDROmorphone (DILAUDID) injection, magnesium sulfate bolus IVPB, phenol, polyvinyl alcohol, senna-docusate, sorbitol, white petrolatum  Physical Exam Constitutional:      Comments: On ventilator.              Vital Signs: BP (!) 162/62   Pulse 96   Temp 99.32 F (37.4 C)   Resp (!) 28   Ht 5' 5"  (1.651 m)   Wt 77.2 kg   SpO2 98%   BMI 28.32 kg/m  SpO2: SpO2: 98 % O2 Device: O2 Device: Ventilator O2 Flow Rate: O2 Flow Rate (L/min): 2 L/min  Intake/output summary:   Intake/Output Summary (Last 24 hours) at 07/14/2020 1147 Last data filed at 07/14/2020 1100 Gross per 24 hour  Intake 4433.81 ml  Output 1460 ml  Net 2973.81 ml   LBM: Last BM Date: 07/12/20 Baseline Weight: Weight: 63.5 kg Most recent weight: Weight: 77.2 kg  Flowsheet Rows   Flowsheet Row Most Recent Value  Intake Tab   Referral Department Hospitalist  Unit at Time of Referral Intermediate Care Unit  Palliative Care Primary Diagnosis Other (Comment)  Date Notified 07/06/20  Palliative Care Type New Palliative care  Reason for referral Clarify Goals of Care  Date of Admission 07/03/2020  Date first seen by Palliative Care 07/06/20  # of days Palliative referral response time 0 Day(s)  # of days IP prior to Palliative referral 5  Clinical Assessment   Palliative Performance Scale Score 20%   Pain Max last 24 hours Not able to report  Pain Min Last 24 hours Not able to report  Dyspnea Max Last 24 Hours Not able to report  Dyspnea Min Last 24 hours Not able to report  Psychosocial & Spiritual Assessment   Palliative Care Outcomes       Patient Active Problem List   Diagnosis Date Noted  . Acute renal failure with acute cortical necrosis (HCC)   . Acute respiratory failure with hypoxia ([place])   . Nasogastric tube present   . Endotracheal tube present   . Liver function abnormality   . Cardiac arrest due to other underlying condition ([place])   . Atherosclerosis of artery of extremity with ulceration ([place]) 06/26/2020  . S/P aorto-bifemoral bypass surgery 06/23/2020  . Atherosclerosis of native arteries of the extremities with ulceration ([place]) 06/16/2020  . MVP (mitral valve prolapse) 06/03/2020  . Peripheral vascular disease ([place]) 05/30/2020  . Type 2 diabetes mellitus with diabetic neuropathy, without long-term current use of insulin ([place]) 04/14/2020  . Diabetic ulcer of left heel associated with type 2 diabetes mellitus ([place]) 02/07/2020  . Aortic ectasia, abdominal ([place]) 10/11/2016  . Hypertension associated with diabetes ([place]) 09/29/2014  . Hyperlipidemia associated with type 2 diabetes mellitus ([place]) 04/08/2014  . Atherosclerotic heart disease of native coronary artery without angina pectoris 03/24/2013  . GERD (gastroesophageal reflux disease) 03/24/2013  . Stented coronary artery 03/24/2013  . Cataracts, bilateral 01/29/2013  . Cortical cataract of both eyes 01/29/2013  . PSC (posterior subcapsular cataract), bilateral 01/29/2013    Palliative Care Assessment & Plan    Recommendations/Plan:  Family considering shifting to comfort care.     Code Status:    Code Status Orders  (From admission, onward)         Start     Ordered   07/13/20 1236  Do not attempt resuscitation (DNR)  Continuous  Question Answer Comment  In the event of cardiac or  respiratory ARREST Do not call a "code blue"   In the event of cardiac or respiratory ARREST Do not perform Intubation, CPR, defibrillation or ACLS   In the event of cardiac or respiratory ARREST Use medication by any route, position, wound care, and other measures to relive pain and suffering. May use oxygen, suction and manual treatment of airway obstruction as needed for comfort.      07/13/20 1236        Code Status History    Date Active Date Inactive Code Status Order ID Comments User Context   06/16/2020 1630 07/13/2020 1236 Full Code [id number]  Algernon Huxley, MD Inpatient   Advance Care Planning Activity       Care plan was discussed with neurology, RN, CCM.   Thank you for allowing the Palliative Medicine Team to assist in the care of this patient.   Time In: 9:50 Time Out: 11:00 Total Time 70 min Prolonged Time Billed  no      Greater than 50%  of this time was spent counseling and coordinating care related to the above assessment and plan.  Asencion Gowda, NP  Please contact Palliative Medicine Team phone at [phone] for questions and concerns.
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
## 2020-07-14 NOTE — Progress Notes (Signed)
65 y/o M w/ multiorgan failure, s/p cardiac arrest & brain damage. DNR/DNI. Now comfort care. Hospitalist service will take over care tomorrow if pt does not pass away before then  This is non-billable note

## 2020-07-14 NOTE — Progress Notes (Signed)
Patient's wife (also hospitalized at this time) was able to visit at bedside. Patient wife and daughter discussing patient's current state of health and concerns regarding his quality of life. Both verbalize that they wish to proceed with comfort care measures only once additional family members have had the opportunity to visit. EEG was ordered and pending, family requested that this be d/c'd as it would not change their decision making. Patient's daughter, Archie Patten, requesting that pain medication be given prior to starting the transition to ensure patient's comfort and limit suffering. Team to place orders at this time.

## 2020-07-14 NOTE — Progress Notes (Signed)
GOALS OF CARE DISCUSSION  The Clinical status was relayed to family in detail. Daughter at bedside  Updated and notified of patients medical condition.  Patient remains unresponsive and will not open eyes to command.    Patient is having a weak cough and struggling to remove secretions.   Patient with increased WOB and using accessory muscles to breathe Explained to family course of therapy and the modalities     Patient with Progressive multiorgan failure with a very high probablity of a very minimal chance of meaningful recovery despite all aggressive and optimal medical therapy. Patient is in the Dying  Process associated with Suffering. Patient with severe brain damage  Family understands the situation.  They have consented and agreed to DNR/DNI and would like to proceed with Comfort care measures when all family members have visited   Family are satisfied with Plan of action and management. All questions answered  Additional CC time 32 mins   Salem Lembke Santiago Glad, M.D.  [facility]  Medical Director [facility] Medical Director [facility] Cardio-Pulmonary Department

## 2020-07-14 NOTE — Progress Notes (Signed)
PHARMACY - TOTAL PARENTERAL NUTRITION CONSULT NOTE   Indication: Prolonged ileus  Patient Measurements: Height: 5\' 5"  (165.1 cm) Weight: 77.2 kg (170 lb 3.1 oz) IBW/kg (Calculated) : 61.5 TPN AdjBW (KG): 63.5 Body mass index is 28.32 kg/m.  Assessment: 65 year old male with PMHx of CAD, PAD, DM, HLD, MI s/p aortobifemoral bypass, bilateral renal endarterectomies, aortic endarterectomy, and bilateral femoral endarterectomies. Tube feeds were unable to be initiated yesterday. Patient was 6 days without nutrition prior to TPN initiation.   Glucose / Insulin: 156 - 202  insulin drip Solucortef 50 mg IV q12H D5 discontinued  Electrolytes: hypokalemia, hypernatremia  Renal: iHD Intake / Output; MIVF: 5080 / 2255 (+ 13 L) GI Imaging: 2/21: Suspect a degree of bowel ileus. No evident free air on semi-erect image GI Surgeries / Procedures: no recent  Central access: 07/05/2020 TPN start date: 07/07/20  Nutritional Goals (per RD recommendation on 07/07/20): kCal: 1800 - 2000/day, Protein: 95 - 110 grams/day, Fluid: 2L/day  Current Nutrition:  NPO  Plan:   Increase TPN back to 80 mL/hr now that D5 has been discontinued for improvement in sodium.  Patient back on propofol this morning. Lipids given yesterday, propofol stopped at the time. Will continue to follow and add lipids MWF as appropriate.  Nutritional Components  Amino Acids (Clinisol 15%) 54 g/L: 84.25 grams total  Dextrose: 19%: 296.4 grams total  Lipids: ~ 50 g MWF (when off propofol)  KCal: 1344.72 total/day  Electrolytes in TPN: Na 30 mEq/L, K 30 mEq/L, Ca 5 mEq/L, Mg 5 mEq/L, and Phos 5 mmol/L. Cl:Ac 1:1  Add standard MVI and trace elements to TPN  Plan to transition off insulin drip. Per recommendation from diabetes coordinator, start Levemir 25 units daily and SSI q4h. Will add 20 units insulin to TPN (less aggressive given recent discontinuation of D5). Will monitor glucose trend and adjust as  indicated.  Monitor TPN labs on Mon/Thurs or as clinically indicated.   07/09/20, PharmD Clinical Pharmacist 07/14/2020,11:06 AM

## 2020-07-14 NOTE — Progress Notes (Signed)
[facility]  ROUNDING NOTE   Subjective:  Patient remains critically ill at the moment. Urine output was 710 cc over the preceding 24 hours. BUN currently 159 with a creatinine of 3.96. Patient sister at bedside this AM. MRI indicative of hypoxic/ischemic brain injury.   Objective:   Vitals:   07/14/20 0700 07/14/20 0800  BP: (!) 156/58 (!) 153/58  Pulse: 90 88  Resp: (!) 22 (!) 27  Temp: 99.14 F (37.3 C) 98.78 F (37.1 C)  SpO2: 97% 95%     Weight change:  Filed Weights   07/11/20 0500 07/12/20 0500 07/13/20 0500  Weight: 74 kg 75.5 kg 77.2 kg    Intake/Output: I/O last 3 completed shifts: In: 6542.2 [I.V.:5352.5; Other:45; NG/GT:155; IV Piggyback:989.7] Out: 1505 [Urine:960; Emesis/NG output:500; Stool:45]   Intake/Output this shift:  Total I/O In: 194.1 [I.V.:153.5; IV Piggyback:40.6] Out: 125 [Urine:125]  Physical Exam: General: Critically ill  HEENT: ETT in place  Lungs:  Scattered rhonchi, normal effort  Heart: Regular rate and rhythm  Abdomen:  +distended, dressings clean, decreased sounds  Extremities: 1+ LE edema  Neurologic: Sedated on the vent  Skin: No lesions  Access: None at this time  Foley in place  Basic Metabolic Panel: Recent Labs  Lab 07/10/20 0312 07/11/20 0424 07/11/20 0622 07/12/20 0416 07/12/20 0850 07/12/20 1207 07/12/20 1550 07/12/20 2240 07/13/20 0504 07/14/20 0549  NA 156* 161*   < > 154* 154* 154* 153* 150* 148* 144  K 3.4* 3.2*   < > 3.5 3.9 3.9 3.9  --  4.6 3.8  CL 117* 123*   < > 117* 118* 118* 117*  --  113* 110  CO2 29 28   < > _0 --  24 21*  GLUCOSE 457* 174*   < > 241* 197* 183* 172*  --  183* 178*  BUN 94* 84*   < > 113* 112* 116* 122*  --  136* 159*  CREATININE 2.88* 2.12*   < > 2.64* 2.90* 2.89* 2.99*  --  3.40* 3.96*  CALCIUM 7.2* 7.6*   < > 7.0* 7.1* 7.1* 6.9*  --  6.8* 6.7*  MG 2.1 2.0  --  1.8  --   --   --   --  2.0 2.1  PHOS 3.4 2.0*  --  3.2  --   --   --   --  5.1* 4.0    < > = values in this interval not displayed.    Liver Function Tests: Recent Labs  Lab 07/08/20 0613 07/11/20 0424 07/13/20 0504 07/14/20 0549  AST 182* 60* 69* 68*  ALT 45* _1 ALKPHOS 171* 112 85 85  BILITOT 1.9* 1.4* 1.3* 1.5*  PROT 4.6* 4.9* 4.5* 4.4*  ALBUMIN 1.7* 2.2* 1.6* 1.4*   No results for input(s): LIPASE, AMYLASE in the last 168 hours. No results for input(s): AMMONIA in the last 168 hours.  CBC: Recent Labs  Lab 07/08/20 6237 07/09/20 0425 07/11/20 0622 07/11/20 0933 07/12/20 0416 07/12/20 1207 07/13/20 0504 07/14/20 0549  WBC 20.5*   < > 57.5* 54.2* 49.5* 49.9* 41.2* 35.9*  NEUTROABS 15.6*  --  53.3*  --   --   --   --   --   HGB 7.9*   < > 9.1* 9.5* 8.0* 7.5* 7.9* 7.4*  HCT 24.3*   < > 29.1* 29.5* 25.0* 24.3* 25.0* 21.7*  MCV 85.3   < > 89.8 89.7 89.6 91.7 89.3 85.1  PLT 127*   < > 187 182 190 193 181 215   < > = values in this interval not displayed.    Cardiac Enzymes: No results for input(s): CKTOTAL, CKMB, CKMBINDEX, TROPONINI in the last 168 hours.  BNP: Invalid input(s): POCBNP  CBG: Recent Labs  Lab 07/14/20 0300 07/14/20 0400 07/14/20 0423 07/14/20 0619 07/14/20 0815  GLUCAP 156* 159* 177* 160* 169*    Microbiology: Results for orders placed or performed during the hospital encounter of 06/22/2020  MRSA PCR Screening     Status: None   Collection Time: 06/23/2020  4:32 PM   Specimen: Nasopharyngeal  Result Value Ref Range Status   MRSA by PCR NEGATIVE NEGATIVE Final    Comment:        The GeneXpert MRSA Assay (FDA approved for NASAL specimens only), is one component of a comprehensive MRSA colonization surveillance program. It is not intended to diagnose MRSA infection nor to guide or monitor treatment for MRSA infections. Performed at [facility], [address] [place], [place] [address]   CULTURE, BLOOD (ROUTINE X 2) w Reflex to ID Panel     Status: None   Collection Time: 07/07/2020  6:07 PM    Specimen: BLOOD  Result Value Ref Range Status   Specimen Description BLOOD BLOOD RIGHT HAND  Final   Special Requests   Final    BOTTLES DRAWN AEROBIC AND ANAEROBIC Blood Culture results may not be optimal due to an inadequate volume of blood received in culture bottles   Culture   Final    NO GROWTH 5 DAYS Performed at [facility], [address] [place], [place] [address]    Report Status 07/06/2020 FINAL  Final  CULTURE, BLOOD (ROUTINE X 2) w Reflex to ID Panel     Status: None   Collection Time: 06/30/2020  6:07 PM   Specimen: BLOOD  Result Value Ref Range Status   Specimen Description BLOOD LEFT THUMB  Final   Special Requests   Final    BOTTLES DRAWN AEROBIC AND ANAEROBIC Blood Culture adequate volume   Culture   Final    NO GROWTH 5 DAYS Performed at [facility], [address], [place], [place] [address]    Report Status 07/06/2020 FINAL  Final  Culture, Respiratory w Gram Stain     Status: None   Collection Time: 07/03/20  2:17 PM   Specimen: Tracheal Aspirate; Respiratory  Result Value Ref Range Status   Specimen Description   Final    TRACHEAL ASPIRATE Performed at [facility], [address] [place], [place] [address]    Special Requests   Final    NONE Performed at [facility], [address] [place], [place] [address]    Gram Stain   Final    FEW WBC PRESENT, PREDOMINANTLY PMN ABUNDANT GRAM NEGATIVE RODS Performed at [facility], [place] [address], [place], [place] [address]    Culture   Final    ABUNDANT ESCHERICHIA COLI Confirmed Extended Spectrum Beta-Lactamase Producer (ESBL).  In bloodstream infections from ESBL organisms, carbapenems are preferred over piperacillin/tazobactam. They are shown to have a lower risk of mortality.    Report Status 07/08/2020 FINAL  Final   Organism ID, Bacteria ESCHERICHIA COLI  Final      Susceptibility   Escherichia coli - MIC*    AMPICILLIN >=32 RESISTANT Resistant      CEFAZOLIN >=64 RESISTANT Resistant     CEFEPIME 8 INTERMEDIATE Intermediate  CEFTAZIDIME RESISTANT Resistant     CEFTRIAXONE >=64 RESISTANT Resistant     CIPROFLOXACIN >=4 RESISTANT Resistant     GENTAMICIN <=1 SENSITIVE Sensitive     IMIPENEM <=0.25 SENSITIVE Sensitive     TRIMETH/SULFA <=20 SENSITIVE Sensitive     AMPICILLIN/SULBACTAM >=32 RESISTANT Resistant     PIP/TAZO <=4 SENSITIVE Sensitive     * ABUNDANT ESCHERICHIA COLI  Urine Culture     Status: None   Collection Time: 07/07/20 10:43 AM   Specimen: Urine, Random  Result Value Ref Range Status   Specimen Description   Final    URINE, RANDOM Performed at [facility], [address] [place], [place] [address]    Special Requests   Final    NONE Performed at [facility], [address] [place], [place] [address]    Culture   Final    NO GROWTH Performed at [facility], [place] [address] [place], [place] [address]    Report Status 07/08/2020 FINAL  Final  Gastrointestinal Panel by PCR , Stool     Status: None   Collection Time: 07/07/20 12:05 PM   Specimen: Stool  Result Value Ref Range Status   Campylobacter species NOT DETECTED NOT DETECTED Final   Plesimonas shigelloides NOT DETECTED NOT DETECTED Final   Salmonella species NOT DETECTED NOT DETECTED Final   Yersinia enterocolitica NOT DETECTED NOT DETECTED Final   Vibrio species NOT DETECTED NOT DETECTED Final   Vibrio cholerae NOT DETECTED NOT DETECTED Final   Enteroaggregative E coli (EAEC) NOT DETECTED NOT DETECTED Final   Enteropathogenic E coli (EPEC) NOT DETECTED NOT DETECTED Final   Enterotoxigenic E coli (ETEC) NOT DETECTED NOT DETECTED Final   Shiga like toxin producing E coli (STEC) NOT DETECTED NOT DETECTED Final   Shigella/Enteroinvasive E coli (EIEC) NOT DETECTED NOT DETECTED Final   Cryptosporidium NOT DETECTED NOT DETECTED Final   Cyclospora cayetanensis NOT DETECTED NOT DETECTED Final   Entamoeba histolytica NOT  DETECTED NOT DETECTED Final   Giardia lamblia NOT DETECTED NOT DETECTED Final   Adenovirus F40/41 NOT DETECTED NOT DETECTED Final   Astrovirus NOT DETECTED NOT DETECTED Final   Norovirus GI/GII NOT DETECTED NOT DETECTED Final   Rotavirus A NOT DETECTED NOT DETECTED Final   Sapovirus (I, II, IV, and V) NOT DETECTED NOT DETECTED Final    Comment: Performed at [facility], [address] [place], [place] [address]  C Difficile Quick Screen w PCR reflex     Status: None   Collection Time: 07/07/20 12:05 PM  Result Value Ref Range Status   C Diff antigen NEGATIVE NEGATIVE Final   C Diff toxin NEGATIVE NEGATIVE Final   C Diff interpretation No C. difficile detected.  Final    Comment: Performed at [facility], [address] [place], [place] [address]  CULTURE, BLOOD (ROUTINE X 2) w Reflex to ID Panel     Status: None   Collection Time: 07/07/20  1:38 PM   Specimen: BLOOD  Result Value Ref Range Status   Specimen Description BLOOD BLOOD LEFT HAND  Final   Special Requests   Final    BOTTLES DRAWN AEROBIC AND ANAEROBIC Blood Culture adequate volume   Culture   Final    NO GROWTH 5 DAYS Performed at [facility], [address] [place],  [address]    Report Status 07/12/2020 FINAL  Final  CULTURE, BLOOD (ROUTINE X 2) w Reflex to ID Panel  Status: None   Collection Time: 07/07/20  1:38 PM   Specimen: BLOOD  Result Value Ref Range Status   Specimen Description BLOOD BLOOD RIGHT HAND  Final   Special Requests   Final    BOTTLES DRAWN AEROBIC AND ANAEROBIC Blood Culture adequate volume   Culture   Final    NO GROWTH 5 DAYS Performed at [facility], [address] [place], [place] [address]    Report Status 07/12/2020 FINAL  Final  SARS CORONAVIRUS 2 (TAT 6-24 HRS) Nasopharyngeal Nasopharyngeal Swab     Status: None   Collection Time: 07/07/20  2:10 PM   Specimen: Nasopharyngeal Swab  Result Value Ref Range Status   SARS  Coronavirus 2 NEGATIVE NEGATIVE Final    Comment: (NOTE) SARS-CoV-2 target nucleic acids are NOT DETECTED.  The SARS-CoV-2 RNA is generally detectable in upper and lower respiratory specimens during the acute phase of infection. Negative results do not preclude SARS-CoV-2 infection, do not rule out co-infections with other pathogens, and should not be used as the sole basis for treatment or other patient management decisions. Negative results must be combined with clinical observations, patient history, and epidemiological information. The expected result is Negative.  Fact Sheet for Patients: SugarRoll.be  Fact Sheet for Healthcare Providers: https://www.woods-mathews.com/  This test is not yet approved or cleared by the [place] FDA and  has been authorized for detection and/or diagnosis of SARS-CoV-2 by FDA under an Emergency Use Authorization (EUA). This EUA will remain  in effect (meaning this test can be used) for the duration of the COVID-19 declaration under Se ction 564(b)(1) of the Act, 21 U.S.C. section 360bbb-3(b)(1), unless the authorization is terminated or revoked sooner.  Performed at [facility], [place] [address], [place], [place] [address]     Coagulation Studies: No results for input(s): LABPROT, INR in the last 72 hours.  Urinalysis: No results for input(s): COLORURINE, LABSPEC, PHURINE, GLUCOSEU, HGBUR, BILIRUBINUR, KETONESUR, PROTEINUR, UROBILINOGEN, NITRITE, LEUKOCYTESUR in the last 72 hours.  Invalid input(s): APPERANCEUR    Imaging: MR BRAIN WO CONTRAST  Result Date: 07/13/2020 CLINICAL DATA:  Mental status change, unknown cause. EXAM: MRI HEAD WITHOUT CONTRAST TECHNIQUE: Multiplanar, multiecho pulse sequences of the brain and surrounding structures were obtained without intravenous contrast. COMPARISON:  07/10/2020 and prior. FINDINGS: Please note image quality is degraded by motion artifact.  Brain: Diffuse cortical prominence with associated mild restricted diffusion most prominent in the bilateral parietooccipital and temporal regions. No focal restricted diffusion involving the cerebellum. Remote left vertex microhemorrhage. No midline shift or ventriculomegaly. No extra-axial fluid collection. No mass lesion. Vascular: Normal flow voids. Skull and upper cervical spine: Normal marrow signal. Sinuses/Orbits: Normal orbits. Mild ethmoid sinus mucosal thickening. Bilateral mastoid free fluid. Other: None. IMPRESSION: Diffuse cerebral prominence with mild associated restricted diffusion concerning for sequela of hypoxic-ischemic injury. Electronically Signed   By: Primitivo Gauze M.D.   On: 07/13/2020 15:48     Medications:    sodium chloride Stopped (07/12/20 1602)   sodium chloride     anidulafungin Stopped (07/13/20 2023)   anticoagulant sodium citrate     dextrose 75 mL/hr at 07/14/20 0800   erythromycin Stopped (07/14/20 0724)   HYDROmorphone 2 mg/hr (07/14/20 0800)   insulin 9.5 mL/hr at 07/14/20 0800   magnesium sulfate bolus IVPB     meropenem (MERREM) IV Stopped (07/13/20 2242)   norepinephrine (LEVOPHED) Adult infusion Stopped (07/12/20 1455)   propofol (DIPRIVAN) infusion 10 mcg/kg/min (07/14/20 0631)   TPN  ADULT (ION) 65 mL/hr at 07/14/20 0800    aspirin EC  81 mg Oral Q0600   chlorhexidine gluconate (MEDLINE KIT)  15 mL Mouth Rinse BID   Chlorhexidine Gluconate Cloth  6 each Topical Once   collagenase  1 application Topical Daily   docusate  100 mg Per Tube Daily   hydrocortisone sod succinate (SOLU-CORTEF) inj  50 mg Intravenous Q12H   mouth rinse  15 mL Mouth Rinse 10 times per day   pantoprazole (PROTONIX) IV  40 mg Intravenous Q12H   sodium chloride, acetaminophen **OR** acetaminophen, alum & mag hydroxide-simeth, anticoagulant sodium citrate, HYDROmorphone (DILAUDID) injection, magnesium sulfate bolus IVPB, phenol, polyvinyl alcohol,  senna-docusate, sorbitol, white petrolatum  Assessment/ Plan:  [name] is a 65 y.o. white male with peripheral vascular disease, coronary artery disease, nephrolithiasis, hyperlipidemia, diabetes mellitus type II, who was admitted to [facility] on 07/11/2020 for Atherosclerosis of artery of extremity with ulceration ([place]) [I70.299, V44.458] S/P aorto-bifemoral bypass surgery [A83.507] Underwent bilateral femoral artery bypass graft by Dr. Lucky Cowboy and Dr. Delana Meyer. on 07/04/2020   #. Acute renal failure : with baseline creatinine of 0.9 with normal GFR on 06/25/20.  History of glycosuria and underlying diabetic kidney disease.  AKI likely ATN due to hypotension  02/28 0701 - 03/01 0700 In: 4556.8 [I.V.:3677.1; NG/GT:120; IV Piggyback:759.7] Out: 1160 [Urine:710; Emesis/NG output:450]  Lab Results  Component Value Date   CREATININE 3.96 (H) 07/14/2020   CREATININE 3.40 (H) 07/13/2020   CREATININE 2.99 (H) 07/12/2020    Urine output was found to be 710 cc over the preceding 24 hours.  However BUN and creatinine both rising.  Patient sister at bedside.  Patient's daughter to be back at the bedside soon.  Case discussed with pulmonary/critical care.  No urgent need for dialysis however this may need to be considered if family continues to desire aggressive care as BUN and creatinine are rising.  Patient is still making some urine however.   #Acute respiratory failure Hypoxic/ischemic brain injury noted.  This may make it difficult to wean the patient from the ventilator.  # hypokalemia Potassium stabilized at 3.8.  Continue to monitor.  # Hypernatremia Sodium currently 150.  DC D5 half-normal saline and switch back to D5W at 100 cc/h.      LOS: 13 Reece Mcbroom 3/1/20229:16 AM

## 2020-07-14 NOTE — Progress Notes (Signed)
Pt was suctioned for a small amount of thick tan secretions. Per Dr. Clovis Fredrickson order, he was extubated to comfort care.

## 2020-07-14 DEATH — deceased

## 2020-07-15 DIAGNOSIS — Z515 Encounter for palliative care: Secondary | ICD-10-CM | POA: Diagnosis not present

## 2020-07-15 DIAGNOSIS — J9601 Acute respiratory failure with hypoxia: Secondary | ICD-10-CM | POA: Diagnosis not present

## 2020-07-15 DIAGNOSIS — R945 Abnormal results of liver function studies: Secondary | ICD-10-CM | POA: Diagnosis not present

## 2020-07-15 DIAGNOSIS — I468 Cardiac arrest due to other underlying condition: Secondary | ICD-10-CM | POA: Diagnosis not present

## 2020-07-15 DIAGNOSIS — N170 Acute kidney failure with tubular necrosis: Secondary | ICD-10-CM | POA: Diagnosis not present

## 2020-07-15 LAB — BLOOD GAS, ARTERIAL
Acid-base deficit: 14.5 mmol/L — ABNORMAL HIGH (ref 0.0–2.0)
Bicarbonate: 12.7 mmol/L — ABNORMAL LOW (ref 20.0–28.0)
FIO2: 0.5
MECHVT: 500 mL
O2 Saturation: 99.4 %
PEEP: 5 cmH2O
Patient temperature: 37
pCO2 arterial: 34 mmHg (ref 32.0–48.0)
pH, Arterial: 7.18 — CL (ref 7.350–7.450)
pO2, Arterial: 191 mmHg — ABNORMAL HIGH (ref 83.0–108.0)

## 2020-07-15 NOTE — Progress Notes (Signed)
Patient made comfort care, extubated Cheyne stokes respirations present Family at bedside and understanding Appreciate medicine service taking the patient in transfer

## 2020-07-15 NOTE — Progress Notes (Signed)
Pt received from ICU. Pt resting with eyes closed.  No distress noted.  VSS.  Pt's daughter, sister and brother in law present. Morphine gtt at 20 mg/hr and Fentanyl gtt at 100 mcg/hr. Initial assessment completed.  No needs at this time.

## 2020-07-15 NOTE — Progress Notes (Addendum)
Daily Progress Note   Patient Name: Ivan Larsen       Date: 07/15/2020 DOB: [birth year]  Age: 65 y.o. MRN#: [id number] Attending Physician: Lynn Ito, MD Primary Care Physician: Marjie Skiff, NP Admit Date: 07-12-2020  Reason for Consultation/Follow-up: Terminal Care  Subjective: Patient is resting in bed extubated. Respirations have regular rhythm. Eyes closed. He is currently comfort care, with no distress noted.  Patient's family is at bedside. All questions answered and support offered. Patient's wife was able to visit with patient prior to her being transferred to SNF. Anticipated hospital death.   Length of Stay: 14  Current Medications: Scheduled Meds:  . fentaNYL (SUBLIMAZE) injection  25 mcg Intravenous Once    Continuous Infusions: . fentaNYL infusion INTRAVENOUS 100 mcg/hr (07/14/20 1940)  . morphine 20 mg/hr (07/15/20 1155)    PRN Meds: diphenhydrAMINE, glycopyrrolate **OR** glycopyrrolate **OR** glycopyrrolate, HYDROmorphone (DILAUDID) injection, HYDROmorphone (DILAUDID) injection, LORazepam, morphine injection, morphine, polyvinyl alcohol, polyvinyl alcohol  Physical Exam Constitutional:      Comments: Eyes closed. Off ventilator.              Vital Signs: BP (!) 129/51 (BP Location: Left Arm)   Pulse 90   Temp 99.5 F (37.5 C)   Resp 16   Ht 5\' 5"  (1.651 m)   Wt 77.2 kg   SpO2 (!) 87%   BMI 28.32 kg/m  SpO2: SpO2: (!) 87 % O2 Device: O2 Device: Room Air O2 Flow Rate: O2 Flow Rate (L/min): 2 L/min  Intake/output summary:   Intake/Output Summary (Last 24 hours) at 07/15/2020 1345 Last data filed at 07/15/2020 0900 Gross per 24 hour  Intake 377.38 ml  Output 625 ml  Net -247.62 ml   LBM: Last BM Date: 07/12/20 Baseline Weight: Weight: 63.5  kg Most recent weight: Weight: 77.2 kg        Flowsheet Rows   Flowsheet Row Most Recent Value  Intake Tab   Referral Department Hospitalist  Unit at Time of Referral Intermediate Care Unit  Palliative Care Primary Diagnosis Other (Comment)  Date Notified 07/06/20  Palliative Care Type New Palliative care  Reason for referral Clarify Goals of Care  Date of Admission 07-12-2020  Date first seen by Palliative Care 07/06/20  # of days Palliative referral response time 0 Day(s)  # of  days IP prior to Palliative referral 5  Clinical Assessment   Palliative Performance Scale Score 20%  Pain Max last 24 hours Not able to report  Pain Min Last 24 hours Not able to report  Dyspnea Max Last 24 Hours Not able to report  Dyspnea Min Last 24 hours Not able to report  Psychosocial & Spiritual Assessment   Palliative Care Outcomes       Patient Active Problem List   Diagnosis Date Noted  . Acute renal failure with tubular necrosis (HCC)   . Acute respiratory failure with hypoxia (HCC)   . Nasogastric tube present   . Endotracheal tube present   . Liver function abnormality   . Cardiac arrest due to other underlying condition (HCC)   . Atherosclerosis of artery of extremity with ulceration (HCC) 07/06/2020  . S/P aorto-bifemoral bypass surgery 06/27/2020  . Atherosclerosis of native arteries of the extremities with ulceration (HCC) 06/16/2020  . MVP (mitral valve prolapse) 06/03/2020  . Peripheral vascular disease (HCC) 05/30/2020  . Type 2 diabetes mellitus with diabetic neuropathy, without long-term current use of insulin (HCC) 04/14/2020  . Diabetic ulcer of left heel associated with type 2 diabetes mellitus (HCC) 02/07/2020  . Aortic ectasia, abdominal (HCC) 10/11/2016  . Hypertension associated with diabetes (HCC) 09/29/2014  . Hyperlipidemia associated with type 2 diabetes mellitus (HCC) 04/08/2014  . Atherosclerotic heart disease of native coronary artery without angina pectoris  03/24/2013  . GERD (gastroesophageal reflux disease) 03/24/2013  . Stented coronary artery 03/24/2013  . Cataracts, bilateral 01/29/2013  . Cortical cataract of both eyes 01/29/2013  . PSC (posterior subcapsular cataract), bilateral 01/29/2013    Palliative Care Assessment & Plan    Recommendations/Plan:  On comfort care per attending team. Anticipated hospital death.     Code Status:    Code Status Orders  (From admission, onward)         Start     Ordered   07/14/20 1348  DNR (Do not attempt resuscitation)  Continuous       Question Answer Comment  In the event of cardiac or respiratory ARREST Do not call a "code blue"   In the event of cardiac or respiratory ARREST Do not perform Intubation, CPR, defibrillation or ACLS   In the event of cardiac or respiratory ARREST Use medication by any route, position, wound care, and other measures to relive pain and suffering. May use oxygen, suction and manual treatment of airway obstruction as needed for comfort.      07/14/20 1347        Code Status History    Date Active Date Inactive Code Status Order ID Comments User Context   07/13/2020 1236 07/14/2020 1347 DNR [id number]  Erin Fulling, MD Inpatient   06/19/2020 1630 07/13/2020 1236 Full Code [id number]  Annice Needy, MD Inpatient   Advance Care Planning Activity       Prognosis:   Hours - Days  Care plan was discussed with RN  Thank you for allowing the Palliative Medicine Team to assist in the care of this patient.   Total Time 15 min Prolonged Time Billed  no      Greater than 50%  of this time was spent counseling and coordinating care related to the above assessment and plan.  Morton Stall, NP  Please contact Palliative Medicine Team phone at [phone] for questions and concerns.
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
## 2020-07-15 NOTE — Progress Notes (Signed)
Nutrition Brief Follow-Up Note  Chart reviewed. Patient has transitioned to comfort care.   No further nutrition interventions warranted at this time. Please re-consult RD as needed.   Jizel Cheeks King, MS, RD, LDN Pager number available on Amion  

## 2020-07-16 LAB — FUNGITELL, SERUM: Fungitell Result: 123 pg/mL — ABNORMAL HIGH (ref <80)

## 2020-08-14 NOTE — Progress Notes (Signed)
MD notified of patient's time of death. [facility] [employer] notified. Patient can go to the morgue but hold on sending to the funeral home for possible tissue donation.

## 2020-08-14 NOTE — Death Summary Note (Addendum)
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 color flow Doppler.   LV Volumes (MOD) LV vol d, MOD A2C: 60.9 ml Diastology LV vol d, MOD A4C: 78.5 ml LV e' medial:    4.68 cm/s LV vol s, MOD A2C: 38.8 ml LV E/e' medial:  14.1 LV vol s, MOD A4C: 48.1 ml LV e' lateral:   13.60 cm/s LV SV MOD A2C:     22.1 ml LV E/e' lateral: 4.9 LV SV MOD A4C:     78.5 ml LV SV MOD BP:      27.9 ml RIGHT VENTRICLE RV Basal diam:  3.00 cm LEFT ATRIUM             Index       RIGHT ATRIUM          Index LA Vol (A2C):   18.8 ml 10.87 ml/m RA Area:     8.18 cm LA Vol (A4C):   24.2 ml 13.99 ml/m RA Volume:   13.60 ml 7.86 ml/m LA Biplane Vol: 22.6 ml 13.06 ml/m  AORTIC VALVE AV Vmax:           98.60 cm/s AV Vmean:          65.400 cm/s AV VTI:            0.154 m AV Peak Grad:      3.9 mmHg AV Mean Grad:      2.0 mmHg LVOT Vmax:         89.40 cm/s LVOT Vmean:        59.300 cm/s LVOT VTI:          0.133 m LVOT/AV VTI ratio: 0.86 MITRAL VALVE MV Area (PHT): 3.31 cm    SHUNTS MV Peak grad:  4.6 mmHg    Systemic VTI: 0.13 m MV Mean grad:  2.0 mmHg MV Vmax:       1.07 m/s MV Vmean:      60.9 cm/s MV Decel Time: 229 msec MV E velocity: 66.00 cm/s MV A velocity: 93.00 cm/s MV E/A ratio:  0.71 Arnoldo Hooker MD Electronically  signed by Arnoldo Hooker MD Signature Date/Time: 07/04/2020/7:52:02 AM    Final    ECHOCARDIOGRAM LIMITED  Result Date: 07/08/2020    ECHOCARDIOGRAM LIMITED REPORT   Patient Name:   RYDER MAN [facility] Date of Exam: 07/08/2020 Medical Rec #:  [id number]         Height:       65.0 in Accession #:    [id number]        Weight:       170.4 lb Date of Birth:  1956-04-06         BSA:          1.848 m Patient Age:    64 years          BP:           111/60 mmHg Patient Gender: M  HR:           99 bpm. Exam Location:  [facility] Procedure: Limited Echo, Cardiac Doppler and Color Doppler Indications:     Cardiac Arrest I46.9  History:         Patient has prior history of Echocardiogram examinations, most                  recent 07/03/2020. Previous Myocardial Infarction and CAD; Risk                  Factors:Dyslipidemia and Diabetes.  Sonographer:     Cristela Blue RDCS (AE) Referring Phys:  [id number] DONALD BRESCIA Diagnosing Phys: Harold Hedge MD  Sonographer Comments: Echo performed with patient supine and on artificial respirator, suboptimal parasternal window, suboptimal apical window and no subcostal window. IMPRESSIONS  1. Left ventricular ejection fraction, by estimation, is 55 to 60%. Left ventricular ejection fraction by PLAX is 56 %. The left ventricle has normal function. The left ventricle has no regional wall motion abnormalities.  2. Right ventricular systolic function was not well visualized. The right ventricular size is not well visualized.  3. The mitral valve was not well visualized. Trivial mitral valve regurgitation.  4. The aortic valve was not well visualized. Aortic valve regurgitation is trivial. FINDINGS  Left Ventricle: Left ventricular ejection fraction, by estimation, is 55 to 60%. Left ventricular ejection fraction by PLAX is 56 %. The left ventricle has normal function. The left ventricle has no regional wall motion abnormalities. The left ventricular internal cavity size was normal in size.  There is no left ventricular hypertrophy. Right Ventricle: The right ventricular size is not well visualized. Right vetricular wall thickness was not well visualized. Right ventricular systolic function was not well visualized. Left Atrium: Left atrial size was normal in size. Right Atrium: Right atrial size was normal in size. Pericardium: There is no evidence of pericardial effusion. Mitral Valve: The mitral valve was not well visualized. Trivial mitral valve regurgitation. Tricuspid Valve: The tricuspid valve is not well visualized. Tricuspid valve regurgitation is trivial. Aortic Valve: The aortic valve was not well visualized. Aortic valve regurgitation is trivial. Pulmonic Valve: The pulmonic valve was not assessed. Aorta: The aortic root was not well visualized. IAS/Shunts: The interatrial septum was not well visualized. LEFT VENTRICLE PLAX 2D LV EF:         Left ventricular ejection fraction by PLAX is 56 %. LVIDd:         3.43 cm LVIDs:         2.45 cm LV PW:         1.27 cm LV IVS:        1.07 cm  LEFT ATRIUM           Index       RIGHT ATRIUM           Index LA diam:      3.40 cm 1.84 cm/m  RA Area:     13.00 cm LA Vol (A4C): 28.2 ml 15.26 ml/m RA Volume:   30.90 ml  16.72 ml/m   AORTA Ao Root diam: 1.40 cm Harold Hedge MD Electronically signed by Harold Hedge MD Signature Date/Time: 07/08/2020/4:36:00 PM    Final    [place] EKG SITE RITE  Result Date: 07/11/2020 If Site Rite image not attached, placement could not be confirmed due to current cardiac rhythm.  CT Angio Abd/Pel w/ and/or w/o  Result Date: 06/25/2020 CLINICAL DATA:  Aortoiliac occlusion EXAM: CTA  ABDOMEN AND PELVIS WITHOUT AND WITH CONTRAST TECHNIQUE: Multidetector CT imaging of the abdomen and pelvis was performed using the standard protocol during bolus administration of intravenous contrast. Multiplanar reconstructed images and MIPs were obtained and reviewed to evaluate the vascular anatomy. CONTRAST:  75mL OMNIPAQUE IOHEXOL 350  MG/ML SOLN COMPARISON:  None. FINDINGS: VASCULAR Aorta: Extensive atherosclerotic plaque along the abdominal aorta. Intermittent mild ectasia without aneurysmal dilation. 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 The dissection flap is calcified in the false lumen thrombosed. This results in approximately 50% diameter reduction in the distal aorta. The aorta remains patent. Celiac: Minimal stenosis at the origin of the celiac artery. SMA: Heavily calcified atherosclerotic plaque results in moderate to high-grade stenosis of the origin of the superior mesenteric artery. Renals: Accessory artery to the lower pole of the right kidney. Heavily calcified atherosclerotic plaque results in moderate to high-grade stenosis of the proximal left renal artery. Calcified plaque obscures the origin of right renal artery. Suspect focal high-grade stenosis. IMA: Patent without evidence of aneurysm, dissection, vasculitis or significant stenosis. Inflow: Extensive heavily calcified atherosclerotic plaque throughout both iliac arteries resulting in high-grade stenosis at the origin on the left, moderate stenosis at the origin on the right and then critical stenosis bordering on occlusion in the mid common iliac arteries bilaterally. The heavily calcified atherosclerotic plaque extends into the internal iliac arteries bilaterally as well as into the origins of the external iliac arteries. Proximal Outflow: Heavily calcified atherosclerotic plaque in both common femoral arteries resulting in significant stenosis on the left. The profunda femoral branches appear patent bilaterally. Critical stenosis of the origin of the right superficial femoral artery. The artery then remains patent. 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 Veins: No obvious venous abnormality within the limitations of this arterial phase study. Review of the MIP images confirms the above findings. NON-VASCULAR  Lower chest: No acute abnormality. Hepatobiliary: No focal liver abnormality is seen. No gallstones, gallbladder wall thickening, or biliary dilatation. Pancreas: Unremarkable. No pancreatic ductal dilatation or surrounding inflammatory changes. Spleen: Normal in size without focal abnormality. Adrenals/Urinary Tract: Adrenal glands are unremarkable. Kidneys are normal, without renal calculi, focal lesion, or hydronephrosis. Bladder is unremarkable. Stomach/Bowel: Stomach is within normal limits. Appendix appears normal. No evidence of bowel wall thickening, distention, or inflammatory changes. Lymphatic: No suspicious lymphadenopathy. Reproductive: Prostatomegaly. Other: No abdominal wall hernia or abnormality. No abdominopelvic ascites. Musculoskeletal: No acute or significant osseous findings. IMPRESSION: VASCULAR 1. Advanced inflow (aortoiliac) and outflow (common and superficial femoral) peripheral arterial disease secondary to heavily calcified atherosclerotic plaque. Aortic Atherosclerosis (ICD10-I70.0). 2. Approximately 50% luminal loss in the distal abdominal aorta secondary to a chronic calcified short segment dissection. 3. High-grade stenosis versus occlusion in the bilateral common iliac artery secondary to bulky and heavily calcified atherosclerotic plaque. 4. Left greater than right common femoral artery stenoses secondary to bulky heavily calcified atherosclerotic plaque. 5. Heavily calcified plaque results in high-grade stenosis of the proximal right superficial femoral artery. The artery remains patent. 6. 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 7. Moderate to high-grade stenosis at the origin of the superior mesenteric artery secondary to heavily calcified atherosclerotic plaque. 8. High-grade stenoses at the origins of both renal artery secondary to heavily calcified atherosclerotic plaque. NON-VASCULAR 1. No acute abnormality within the abdomen pelvis. 2. Marked  prostatomegaly. Electronically Signed   By: Malachy Moan M.D.   On: 06/25/2020 15:02   US Abdomen Limited RUQ (LIVER/GB)  Result Date:  07/08/2020 CLINICAL DATA:  Elevated liver function tests. EXAM: ULTRASOUND ABDOMEN LIMITED RIGHT UPPER QUADRANT COMPARISON:  CT abdomen and pelvis 06/25/2020. FINDINGS: Gallbladder: There is a moderately large volume of sludge within the gallbladder. Two small polyps are identified each measuring approximately 0.4 cm. No wall thickening or pericholecystic fluid. Sonographer reports negative Murphy's sign. Common bile duct: Diameter: 0.4 cm Liver: No focal lesion is seen. Echogenicity is increased. Portal vein is patent on color Doppler imaging with normal direction of blood flow towards the liver. Other: A small volume of abdominal and pelvic ascites is present. IMPRESSION: Fatty infiltration of the liver. Small volume of ascites. Gallbladder sludge. Negative for stones or evidence of cholecystitis. Two 0.4 cm gallbladder polyps. Per consensus statement, gallbladder polyps less than 6 mm are usually benign and not require follow-up. Electronically Signed   By: Drusilla Kannerhomas  Dalessio M.D.   On: 07/08/2020 10:36    Microbiology: No results found for this or any previous visit (from the past 240 hour(s)).   Labs: Basic Metabolic Panel: Recent Labs  Lab 07/11/20 0424 07/11/20 0622 07/12/20 0416 07/12/20 0850 07/12/20 1207 07/12/20 1550 07/12/20 2240 07/13/20 0504 07/14/20 0549  NA 161*   < > 154* 154* 154* 153* 150* 148* 144  K 3.2*   < > 3.5 3.9 3.9 3.9  --  4.6 3.8  CL 123*   < > 117* 118* 118* 117*  --  113* 110  CO2 28   < > 26 26 27 27   --  24 21*  GLUCOSE 174*   < > 241* 197* 183* 172*  --  183* 178*  BUN 84*   < > 113* 112* 116* 122*  --  136* 159*  CREATININE 2.12*   < > 2.64* 2.90* 2.89* 2.99*  --  3.40* 3.96*  CALCIUM 7.6*   < > 7.0* 7.1* 7.1* 6.9*  --  6.8* 6.7*  MG 2.0  --  1.8  --   --   --   --  2.0 2.1  PHOS 2.0*  --  3.2  --   --   --   --   5.1* 4.0   < > = values in this interval not displayed.   Liver Function Tests: Recent Labs  Lab 07/11/20 0424 07/13/20 0504 07/14/20 0549  AST 60* 69* 68*  ALT 20 16 18   ALKPHOS 112 85 85  BILITOT 1.4* 1.3* 1.5*  PROT 4.9* 4.5* 4.4*  ALBUMIN 2.2* 1.6* 1.4*   No results for input(s): LIPASE, AMYLASE in the last 168 hours. No results for input(s): AMMONIA in the last 168 hours. CBC: Recent Labs  Lab 07/11/20 0622 07/11/20 0933 07/12/20 0416 07/12/20 1207 07/13/20 0504 07/14/20 0549  WBC 57.5* 54.2* 49.5* 49.9* 41.2* 35.9*  NEUTROABS 53.3*  --   --   --   --   --   HGB 9.1* 9.5* 8.0* 7.5* 7.9* 7.4*  HCT 29.1* 29.5* 25.0* 24.3* 25.0* 21.7*  MCV 89.8 89.7 89.6 91.7 89.3 85.1  PLT 187 182 190 193 181 215   Cardiac Enzymes: No results for input(s): CKTOTAL, CKMB, CKMBINDEX, TROPONINI in the last 168 hours. D-Dimer No results for input(s): DDIMER in the last 72 hours. BNP: Invalid input(s): POCBNP CBG: Recent Labs  Lab 07/14/20 0619 07/14/20 0815 07/14/20 1032 07/14/20 1200 07/14/20 1253  GLUCAP 160* 169* 159* 156* 157*   Anemia work up No results for input(s): VITAMINB12, FOLATE, FERRITIN, TIBC, IRON, RETICCTPCT in the last 72 hours. Urinalysis  Component Value Date/Time   COLORURINE YELLOW (A) 07/07/2020 1043   APPEARANCEUR CLEAR (A) 07/07/2020 1043   APPEARANCEUR Clear 02/07/2020 0807   LABSPEC 1.012 07/07/2020 1043   LABSPEC 1.027 07/01/2012 1307   PHURINE 5.0 07/07/2020 1043   GLUCOSEU 150 (A) 07/07/2020 1043   GLUCOSEU >=500 07/01/2012 1307   HGBUR LARGE (A) 07/07/2020 1043   BILIRUBINUR NEGATIVE 07/07/2020 1043   BILIRUBINUR Negative 02/07/2020 0807   BILIRUBINUR Negative 07/01/2012 1307   KETONESUR NEGATIVE 07/07/2020 1043   PROTEINUR 30 (A) 07/07/2020 1043   NITRITE NEGATIVE 07/07/2020 1043   LEUKOCYTESUR NEGATIVE 07/07/2020 1043   LEUKOCYTESUR Negative 07/01/2012 1307   Sepsis Labs Invalid input(s): PROCALCITONIN,  WBC,   LACTICIDVEN     SIGNED:  Lynn Ito, MD  [facility] 07/17/2020, 3:44 PM Pager   If 7PM-7AM, please contact night-coverage [email or web address] Password [email or web address]

## 2020-08-14 DEATH — deceased
